# Patient Record
Sex: Female | Born: 1950
Health system: Southern US, Community
[De-identification: ages and names within clinical notes are randomized; demographics above are authoritative.]

## PROBLEM LIST (undated history)

## (undated) DIAGNOSIS — C50119 Malignant neoplasm of central portion of unspecified female breast: Secondary | ICD-10-CM

## (undated) DIAGNOSIS — N6019 Diffuse cystic mastopathy of unspecified breast: Secondary | ICD-10-CM

## (undated) DIAGNOSIS — I499 Cardiac arrhythmia, unspecified: Secondary | ICD-10-CM

## (undated) DIAGNOSIS — F419 Anxiety disorder, unspecified: Secondary | ICD-10-CM

## (undated) DIAGNOSIS — K219 Gastro-esophageal reflux disease without esophagitis: Secondary | ICD-10-CM

## (undated) DIAGNOSIS — I4891 Unspecified atrial fibrillation: Secondary | ICD-10-CM

## (undated) DIAGNOSIS — I10 Essential (primary) hypertension: Secondary | ICD-10-CM

## (undated) DIAGNOSIS — C50919 Malignant neoplasm of unspecified site of unspecified female breast: Secondary | ICD-10-CM

## (undated) DIAGNOSIS — N63 Unspecified lump in unspecified breast: Secondary | ICD-10-CM

## (undated) HISTORY — DX: Essential (primary) hypertension: I10

## (undated) HISTORY — PX: TONSILLECTOMY: SUR1361

## (undated) HISTORY — DX: Unspecified atrial fibrillation: I48.91

## (undated) HISTORY — PX: BREAST SURGERY: SHX581

## (undated) HISTORY — DX: Unspecified lump in unspecified breast: N63.0

## (undated) HISTORY — DX: Diffuse cystic mastopathy of unspecified breast: N60.19

## (undated) HISTORY — DX: Anxiety disorder, unspecified: F41.9

## (undated) HISTORY — DX: Malignant neoplasm of central portion of unspecified female breast: C50.119

## (undated) HISTORY — DX: Gastro-esophageal reflux disease without esophagitis: K21.9

---

## 1997-12-22 HISTORY — PX: ABDOMINAL HYSTERECTOMY: SHX81

## 2000-12-22 HISTORY — PX: COLONOSCOPY: SHX174

## 2002-12-22 HISTORY — PX: OTHER SURGICAL HISTORY: SHX169

## 2003-12-23 DIAGNOSIS — I10 Essential (primary) hypertension: Secondary | ICD-10-CM

## 2003-12-23 DIAGNOSIS — N6019 Diffuse cystic mastopathy of unspecified breast: Secondary | ICD-10-CM

## 2003-12-23 HISTORY — DX: Essential (primary) hypertension: I10

## 2003-12-23 HISTORY — PX: KNEE SURGERY: SHX244

## 2003-12-23 HISTORY — DX: Diffuse cystic mastopathy of unspecified breast: N60.19

## 2005-09-02 ENCOUNTER — Ambulatory Visit: Payer: Self-pay | Admitting: Obstetrics and Gynecology

## 2005-09-10 ENCOUNTER — Ambulatory Visit: Payer: Self-pay | Admitting: Obstetrics and Gynecology

## 2006-09-08 ENCOUNTER — Ambulatory Visit: Payer: Self-pay | Admitting: Obstetrics and Gynecology

## 2007-09-10 ENCOUNTER — Ambulatory Visit: Payer: Self-pay | Admitting: Obstetrics and Gynecology

## 2011-01-02 ENCOUNTER — Ambulatory Visit: Payer: Self-pay | Admitting: Family Medicine

## 2012-12-22 DIAGNOSIS — C50919 Malignant neoplasm of unspecified site of unspecified female breast: Secondary | ICD-10-CM

## 2012-12-22 DIAGNOSIS — N63 Unspecified lump in unspecified breast: Secondary | ICD-10-CM

## 2012-12-22 DIAGNOSIS — I4891 Unspecified atrial fibrillation: Secondary | ICD-10-CM

## 2012-12-22 HISTORY — PX: MASTECTOMY: SHX3

## 2012-12-22 HISTORY — DX: Malignant neoplasm of unspecified site of unspecified female breast: C50.919

## 2012-12-22 HISTORY — DX: Unspecified lump in unspecified breast: N63.0

## 2012-12-22 HISTORY — PX: BREAST SURGERY: SHX581

## 2012-12-22 HISTORY — DX: Unspecified atrial fibrillation: I48.91

## 2012-12-31 ENCOUNTER — Ambulatory Visit: Payer: Self-pay | Admitting: Family Medicine

## 2012-12-31 LAB — HM MAMMOGRAPHY

## 2013-01-10 ENCOUNTER — Ambulatory Visit: Payer: Self-pay | Admitting: General Surgery

## 2013-01-17 ENCOUNTER — Ambulatory Visit: Payer: Self-pay | Admitting: General Surgery

## 2013-01-24 ENCOUNTER — Ambulatory Visit: Payer: Self-pay | Admitting: General Surgery

## 2013-01-24 DIAGNOSIS — C50119 Malignant neoplasm of central portion of unspecified female breast: Secondary | ICD-10-CM

## 2013-01-24 HISTORY — DX: Malignant neoplasm of central portion of unspecified female breast: C50.119

## 2013-01-24 LAB — PROTIME-INR
INR: 1.1
Prothrombin Time: 15 secs — ABNORMAL HIGH (ref 11.5–14.7)

## 2013-01-27 ENCOUNTER — Ambulatory Visit: Payer: Self-pay | Admitting: Hematology and Oncology

## 2013-02-18 ENCOUNTER — Ambulatory Visit: Payer: Self-pay | Admitting: Radiation Oncology

## 2013-02-19 ENCOUNTER — Ambulatory Visit: Payer: Self-pay | Admitting: Hematology and Oncology

## 2013-03-01 LAB — PATHOLOGY REPORT

## 2013-03-04 DIAGNOSIS — Z09 Encounter for follow-up examination after completed treatment for conditions other than malignant neoplasm: Secondary | ICD-10-CM

## 2013-03-04 LAB — COMPREHENSIVE METABOLIC PANEL
Albumin: 3.6 g/dL (ref 3.4–5.0)
Alkaline Phosphatase: 79 U/L (ref 50–136)
Anion Gap: 10 (ref 7–16)
BUN: 15 mg/dL (ref 7–18)
Bilirubin,Total: 0.5 mg/dL (ref 0.2–1.0)
Calcium, Total: 8.5 mg/dL (ref 8.5–10.1)
Chloride: 103 mmol/L (ref 98–107)
Co2: 29 mmol/L (ref 21–32)
Creatinine: 1.07 mg/dL (ref 0.60–1.30)
EGFR (African American): >60
EGFR (Non-African Amer.): 56 — ABNORMAL LOW
Glucose: 111 mg/dL — ABNORMAL HIGH (ref 65–99)
Osmolality: 285 (ref 275–301)
Potassium: 3.5 mmol/L (ref 3.5–5.1)
SGOT(AST): 17 U/L (ref 15–37)
SGPT (ALT): 22 U/L (ref 12–78)
Sodium: 142 mmol/L (ref 136–145)
Total Protein: 7.5 g/dL (ref 6.4–8.2)

## 2013-03-04 LAB — CBC CANCER CENTER
Basophil #: 0.1 x10 3/mm (ref 0.0–0.1)
Basophil %: 0.7 %
Eosinophil #: 0.2 x10 3/mm (ref 0.0–0.7)
Eosinophil %: 1.9 %
HCT: 38.1 % (ref 35.0–47.0)
HGB: 13.1 g/dL (ref 12.0–16.0)
Lymphocyte #: 2.5 x10 3/mm (ref 1.0–3.6)
Lymphocyte %: 26.8 %
MCH: 29.7 pg (ref 26.0–34.0)
MCHC: 34.4 g/dL (ref 32.0–36.0)
MCV: 86 fL (ref 80–100)
Monocyte #: 0.8 x10 3/mm (ref 0.2–0.9)
Monocyte %: 8.5 %
Neutrophil #: 5.9 x10 3/mm (ref 1.4–6.5)
Neutrophil %: 62.1 %
Platelet: 196 x10 3/mm (ref 150–440)
RBC: 4.42 10*6/uL (ref 3.80–5.20)
RDW: 14.4 % (ref 11.5–14.5)
WBC: 9.4 x10 3/mm (ref 3.6–11.0)

## 2013-03-04 LAB — PROTIME-INR
INR: 2.1
Prothrombin Time: 23.2 secs — ABNORMAL HIGH (ref 11.5–14.7)

## 2013-03-07 ENCOUNTER — Ambulatory Visit: Payer: Self-pay | Admitting: General Surgery

## 2013-03-07 DIAGNOSIS — C50919 Malignant neoplasm of unspecified site of unspecified female breast: Secondary | ICD-10-CM

## 2013-03-07 LAB — PROTIME-INR
INR: 1.2
Prothrombin Time: 15.2 secs — ABNORMAL HIGH (ref 11.5–14.7)

## 2013-03-15 LAB — CBC CANCER CENTER
Basophil #: 0 x10 3/mm (ref 0.0–0.1)
Basophil %: 1.1 %
Eosinophil #: 0 x10 3/mm (ref 0.0–0.7)
Eosinophil %: 2 %
HCT: 35.4 % (ref 35.0–47.0)
HGB: 12.4 g/dL (ref 12.0–16.0)
Lymphocyte #: 1.3 x10 3/mm (ref 1.0–3.6)
Lymphocyte %: 82.7 %
MCH: 30.4 pg (ref 26.0–34.0)
MCHC: 34.9 g/dL (ref 32.0–36.0)
MCV: 87 fL (ref 80–100)
Monocyte #: 0.1 x10 3/mm — ABNORMAL LOW (ref 0.2–0.9)
Monocyte %: 4.9 %
Neutrophil #: 0.2 x10 3/mm — ABNORMAL LOW (ref 1.4–6.5)
Neutrophil %: 9.3 %
Platelet: 175 x10 3/mm (ref 150–440)
RBC: 4.07 10*6/uL (ref 3.80–5.20)
RDW: 14.1 % (ref 11.5–14.5)
WBC: 1.6 x10 3/mm — CL (ref 3.6–11.0)

## 2013-03-18 LAB — BASIC METABOLIC PANEL
Anion Gap: 11 (ref 7–16)
BUN: 27 mg/dL — ABNORMAL HIGH (ref 7–18)
Calcium, Total: 8.5 mg/dL (ref 8.5–10.1)
Chloride: 97 mmol/L — ABNORMAL LOW (ref 98–107)
Co2: 29 mmol/L (ref 21–32)
Creatinine: 1.54 mg/dL — ABNORMAL HIGH (ref 0.60–1.30)
EGFR (African American): 41 — ABNORMAL LOW
EGFR (Non-African Amer.): 36 — ABNORMAL LOW
Glucose: 132 mg/dL — ABNORMAL HIGH (ref 65–99)
Osmolality: 281 (ref 275–301)
Potassium: 3.6 mmol/L (ref 3.5–5.1)
Sodium: 137 mmol/L (ref 136–145)

## 2013-03-18 LAB — CBC CANCER CENTER
Basophil #: 0 x10 3/mm (ref 0.0–0.1)
Basophil %: 1.1 %
Eosinophil #: 0 x10 3/mm (ref 0.0–0.7)
Eosinophil %: 1 %
HCT: 33.9 % — ABNORMAL LOW (ref 35.0–47.0)
HGB: 11.7 g/dL — ABNORMAL LOW (ref 12.0–16.0)
Lymphocyte #: 1.6 x10 3/mm (ref 1.0–3.6)
Lymphocyte %: 60.3 %
MCH: 30 pg (ref 26.0–34.0)
MCHC: 34.6 g/dL (ref 32.0–36.0)
MCV: 87 fL (ref 80–100)
Monocyte #: 0.9 x10 3/mm (ref 0.2–0.9)
Monocyte %: 34.5 %
Neutrophil #: 0.1 x10 3/mm — ABNORMAL LOW (ref 1.4–6.5)
Neutrophil %: 3.1 %
Platelet: 211 x10 3/mm (ref 150–440)
RBC: 3.91 10*6/uL (ref 3.80–5.20)
RDW: 14.4 % (ref 11.5–14.5)
WBC: 2.6 x10 3/mm — ABNORMAL LOW (ref 3.6–11.0)

## 2013-03-22 ENCOUNTER — Ambulatory Visit: Payer: Self-pay | Admitting: Hematology and Oncology

## 2013-03-22 LAB — CBC CANCER CENTER
Basophil #: 0.1 x10 3/mm (ref 0.0–0.1)
Basophil %: 1.9 %
Eosinophil #: 0 x10 3/mm (ref 0.0–0.7)
Eosinophil %: 0.4 %
HCT: 36.5 % (ref 35.0–47.0)
HGB: 12.6 g/dL (ref 12.0–16.0)
Lymphocyte #: 1.1 x10 3/mm (ref 1.0–3.6)
Lymphocyte %: 24 %
MCH: 29.5 pg (ref 26.0–34.0)
MCHC: 34.5 g/dL (ref 32.0–36.0)
MCV: 86 fL (ref 80–100)
Monocyte #: 0.7 x10 3/mm (ref 0.2–0.9)
Monocyte %: 14.6 %
Neutrophil #: 2.8 x10 3/mm (ref 1.4–6.5)
Neutrophil %: 59.1 %
Platelet: 154 x10 3/mm (ref 150–440)
RBC: 4.27 10*6/uL (ref 3.80–5.20)
RDW: 14.8 % — ABNORMAL HIGH (ref 11.5–14.5)
WBC: 4.8 x10 3/mm (ref 3.6–11.0)

## 2013-03-28 ENCOUNTER — Encounter: Payer: Self-pay | Admitting: *Deleted

## 2013-03-28 DIAGNOSIS — N63 Unspecified lump in unspecified breast: Secondary | ICD-10-CM | POA: Insufficient documentation

## 2013-03-28 DIAGNOSIS — C50119 Malignant neoplasm of central portion of unspecified female breast: Secondary | ICD-10-CM | POA: Insufficient documentation

## 2013-03-29 LAB — PROTIME-INR
INR: 1.9
Prothrombin Time: 21.6 secs — ABNORMAL HIGH (ref 11.5–14.7)

## 2013-03-29 LAB — CBC CANCER CENTER
Basophil #: 0.1 x10 3/mm (ref 0.0–0.1)
Basophil %: 1 %
Eosinophil #: 0.1 x10 3/mm (ref 0.0–0.7)
Eosinophil %: 0.6 %
HCT: 34.4 % — ABNORMAL LOW (ref 35.0–47.0)
HGB: 11.7 g/dL — ABNORMAL LOW (ref 12.0–16.0)
Lymphocyte #: 2 x10 3/mm (ref 1.0–3.6)
Lymphocyte %: 18.8 %
MCH: 29.2 pg (ref 26.0–34.0)
MCHC: 34.1 g/dL (ref 32.0–36.0)
MCV: 86 fL (ref 80–100)
Monocyte #: 0.8 x10 3/mm (ref 0.2–0.9)
Monocyte %: 7.3 %
Neutrophil #: 7.7 x10 3/mm — ABNORMAL HIGH (ref 1.4–6.5)
Neutrophil %: 72.3 %
Platelet: 181 x10 3/mm (ref 150–440)
RBC: 4.01 10*6/uL (ref 3.80–5.20)
RDW: 14.8 % — ABNORMAL HIGH (ref 11.5–14.5)
WBC: 10.7 x10 3/mm (ref 3.6–11.0)

## 2013-03-29 LAB — COMPREHENSIVE METABOLIC PANEL
Albumin: 3.2 g/dL — ABNORMAL LOW (ref 3.4–5.0)
Alkaline Phosphatase: 74 U/L (ref 50–136)
Anion Gap: 7 (ref 7–16)
BUN: 17 mg/dL (ref 7–18)
Bilirubin,Total: 0.4 mg/dL (ref 0.2–1.0)
Calcium, Total: 8.6 mg/dL (ref 8.5–10.1)
Chloride: 102 mmol/L (ref 98–107)
Co2: 29 mmol/L (ref 21–32)
Creatinine: 1.05 mg/dL (ref 0.60–1.30)
EGFR (African American): >60
EGFR (Non-African Amer.): 57 — ABNORMAL LOW
Glucose: 132 mg/dL — ABNORMAL HIGH (ref 65–99)
Osmolality: 279 (ref 275–301)
Potassium: 3.9 mmol/L (ref 3.5–5.1)
SGOT(AST): 21 U/L (ref 15–37)
SGPT (ALT): 32 U/L (ref 12–78)
Sodium: 138 mmol/L (ref 136–145)
Total Protein: 7 g/dL (ref 6.4–8.2)

## 2013-04-05 ENCOUNTER — Encounter: Payer: Self-pay | Admitting: General Surgery

## 2013-04-05 ENCOUNTER — Ambulatory Visit (INDEPENDENT_AMBULATORY_CARE_PROVIDER_SITE_OTHER): Payer: BC Managed Care – PPO | Admitting: General Surgery

## 2013-04-05 VITALS — BP 124/82 | HR 80 | Resp 14 | Ht 64.0 in | Wt 217.0 lb

## 2013-04-05 DIAGNOSIS — C50911 Malignant neoplasm of unspecified site of right female breast: Secondary | ICD-10-CM

## 2013-04-05 DIAGNOSIS — C50919 Malignant neoplasm of unspecified site of unspecified female breast: Secondary | ICD-10-CM

## 2013-04-05 NOTE — Patient Instructions (Signed)
Patient to follow up in September 2014.

## 2013-04-05 NOTE — Progress Notes (Signed)
Patient ID: Erin Romero, female   DOB: [birth year], 62 y.o.   MRN: [id number]  Chief Complaint  Patient presents with  . Breast Cancer Long Term Follow Up    HPI Erin Romero is a 62 y.o. female who presents for a 1 month follow up breast cancer. Patient not feeling well due to chemotherapy treatments. She states she has had 2-3 treatments at this point. She states she sometimes feels pain in the left breast but thinks at times it's her imagination and nervousness since she had a right mastectomy. No other complaints at this time.  HPI  Past Medical History  Diagnosis Date  . Hypertension 2005  . Atrial fibrillation 2014  . Lump or mass in breast 2014    right mastectomy  . Malignant neoplasm of central portion of female breast 2014    right mastectomy  . Fibrocystic breast disease 2005    Past Surgical History  Procedure Laterality Date  . Breast surgery Right 2005,2014    biopsy  . Breast surgery Right 2014    mastectomy  . Knee surgery Left 2005  . Abdominal hysterectomy  1999  . Thumb surg Right 2004  . Tonsillectomy      age of 42  . Colonoscopy  2002    Family History  Problem Relation Age of Onset  . Cancer Other     ovarian,liver,colon cancer-no family member listed    Social History History  Substance Use Topics  . Smoking status: Never Smoker   . Smokeless tobacco: Not on file  . Alcohol Use: No    Allergies  Allergen Reactions  . Sulfa Antibiotics Hives    Current Outpatient Prescriptions  Medication Sig Dispense Refill  . acetaminophen (TYLENOL) 500 MG tablet Take 500-1,000 mg by mouth every 4 (four) hours as needed for pain (q 4-6 prn).      . ALPRAZolam (XANAX) 0.5 MG tablet Take 0.5 mg by mouth 2 (two) times daily. Take 1/2 to 1 tablet twice a day      . amLODipine (NORVASC) 5 MG tablet Take 10 mg by mouth daily.      Marland Kitchen aspirin 81 MG chewable tablet Chew 81 mg by mouth daily.      . bisoprolol-hydrochlorothiazide (ZIAC) 10-6.25  MG per tablet Take 1 tablet by mouth daily.      . Calcium Carb-Cholecalciferol (OYSTER SHELL CALCIUM + D PO) Take by mouth.      . Cholecalciferol (VITAMIN D-3 PO) Take 2,000 Units by mouth daily.      . hydrochlorothiazide (MICROZIDE) 12.5 MG capsule Take 12.5 mg by mouth daily.      Marland Kitchen lisinopril (PRINIVIL,ZESTRIL) 20 MG tablet Take 20 mg by mouth daily.      Marland Kitchen omeprazole (PRILOSEC) 20 MG capsule Take 20 mg by mouth daily.      . temazepam (RESTORIL) 15 MG capsule Take 15 mg by mouth at bedtime as needed.       . warfarin (COUMADIN) 2.5 MG tablet Take 2.5 mg by mouth daily.       No current facility-administered medications for this visit.    Review of Systems Review of Systems  Constitutional: Negative.   Respiratory: Negative.   Cardiovascular: Negative.     Blood pressure 124/82, pulse 80, resp. rate 14, height 5\' 4"  (1.626 m), weight 217 lb (98.431 kg).  Physical Exam Physical Exam  Constitutional: She appears well-developed and well-nourished.  Neck: Trachea normal. No mass and no thyromegaly present.  Cardiovascular:  Normal rate, regular rhythm, normal heart sounds and normal pulses.   No murmur heard. Pulmonary/Chest: Effort normal and breath sounds normal. Left breast exhibits no inverted nipple, no mass, no nipple discharge, no skin change and no tenderness.  Power port looks good as well as right mastectomy site.     Data Reviewed None  Assessment    Doing well status post wide excision, sentinel node biopsy. Moderate fatigue secondary to recently initiated chemotherapy.    Plan    Based on the anticipated course of adjuvant chemotherapy and post chemotherapy radiation, plans will be for a followup examination in September 2014 with bilateral diagnostic mammograms.       Erin Romero 04/06/2013, 3:51 PM

## 2013-04-06 ENCOUNTER — Encounter: Payer: Self-pay | Admitting: General Surgery

## 2013-04-06 LAB — COMPREHENSIVE METABOLIC PANEL
Albumin: 2.9 g/dL — ABNORMAL LOW (ref 3.4–5.0)
Alkaline Phosphatase: 59 U/L (ref 50–136)
Anion Gap: 5 — ABNORMAL LOW (ref 7–16)
BUN: 15 mg/dL (ref 7–18)
Bilirubin,Total: 0.6 mg/dL (ref 0.2–1.0)
Calcium, Total: 8.2 mg/dL — ABNORMAL LOW (ref 8.5–10.1)
Chloride: 106 mmol/L (ref 98–107)
Co2: 29 mmol/L (ref 21–32)
Creatinine: 0.67 mg/dL (ref 0.60–1.30)
EGFR (African American): >60
EGFR (Non-African Amer.): >60
Glucose: 87 mg/dL (ref 65–99)
Osmolality: 280 (ref 275–301)
Potassium: 3.6 mmol/L (ref 3.5–5.1)
SGOT(AST): 28 U/L (ref 15–37)
SGPT (ALT): 33 U/L (ref 12–78)
Sodium: 140 mmol/L (ref 136–145)
Total Protein: 6 g/dL — ABNORMAL LOW (ref 6.4–8.2)

## 2013-04-06 LAB — CBC CANCER CENTER
Basophil #: 0 x10 3/mm (ref 0.0–0.1)
Basophil %: 3.6 %
Eosinophil #: 0 x10 3/mm (ref 0.0–0.7)
Eosinophil %: 2.7 %
HCT: 32.8 % — ABNORMAL LOW (ref 35.0–47.0)
HGB: 11.4 g/dL — ABNORMAL LOW (ref 12.0–16.0)
Lymphocyte #: 0.9 x10 3/mm — ABNORMAL LOW (ref 1.0–3.6)
Lymphocyte %: 63.2 %
MCH: 29.8 pg (ref 26.0–34.0)
MCHC: 34.9 g/dL (ref 32.0–36.0)
MCV: 86 fL (ref 80–100)
Monocyte #: 0.2 x10 3/mm (ref 0.2–0.9)
Monocyte %: 14.8 %
Neutrophil #: 0.2 x10 3/mm — ABNORMAL LOW (ref 1.4–6.5)
Neutrophil %: 15.7 %
Platelet: 171 x10 3/mm (ref 150–440)
RBC: 3.84 10*6/uL (ref 3.80–5.20)
RDW: 15.2 % — ABNORMAL HIGH (ref 11.5–14.5)
WBC: 1.4 x10 3/mm — CL (ref 3.6–11.0)

## 2013-04-06 LAB — MAGNESIUM: Magnesium: 1.7 mg/dL — ABNORMAL LOW

## 2013-04-13 LAB — BASIC METABOLIC PANEL
Anion Gap: 11 (ref 7–16)
BUN: 13 mg/dL (ref 7–18)
Calcium, Total: 9 mg/dL (ref 8.5–10.1)
Chloride: 104 mmol/L (ref 98–107)
Co2: 26 mmol/L (ref 21–32)
Creatinine: 0.98 mg/dL (ref 0.60–1.30)
EGFR (African American): >60
EGFR (Non-African Amer.): >60
Glucose: 120 mg/dL — ABNORMAL HIGH (ref 65–99)
Osmolality: 283 (ref 275–301)
Potassium: 3.7 mmol/L (ref 3.5–5.1)
Sodium: 141 mmol/L (ref 136–145)

## 2013-04-13 LAB — CBC CANCER CENTER
Basophil #: 0.1 x10 3/mm (ref 0.0–0.1)
Basophil %: 1.4 %
Eosinophil #: 0 x10 3/mm (ref 0.0–0.7)
Eosinophil %: 0.4 %
HCT: 31.8 % — ABNORMAL LOW (ref 35.0–47.0)
HGB: 10.9 g/dL — ABNORMAL LOW (ref 12.0–16.0)
Lymphocyte #: 1.6 x10 3/mm (ref 1.0–3.6)
Lymphocyte %: 25.9 %
MCH: 29.2 pg (ref 26.0–34.0)
MCHC: 34.3 g/dL (ref 32.0–36.0)
MCV: 85 fL (ref 80–100)
Monocyte #: 1 x10 3/mm — ABNORMAL HIGH (ref 0.2–0.9)
Monocyte %: 16.7 %
Neutrophil #: 3.4 x10 3/mm (ref 1.4–6.5)
Neutrophil %: 55.6 %
Platelet: 154 x10 3/mm (ref 150–440)
RBC: 3.74 10*6/uL — ABNORMAL LOW (ref 3.80–5.20)
RDW: 17.2 % — ABNORMAL HIGH (ref 11.5–14.5)
WBC: 6.2 x10 3/mm (ref 3.6–11.0)

## 2013-04-20 LAB — COMPREHENSIVE METABOLIC PANEL
Albumin: 3.2 g/dL — ABNORMAL LOW (ref 3.4–5.0)
Alkaline Phosphatase: 68 U/L (ref 50–136)
Anion Gap: 9 (ref 7–16)
BUN: 14 mg/dL (ref 7–18)
Bilirubin,Total: 0.4 mg/dL (ref 0.2–1.0)
Calcium, Total: 8.9 mg/dL (ref 8.5–10.1)
Chloride: 104 mmol/L (ref 98–107)
Co2: 26 mmol/L (ref 21–32)
Creatinine: 0.88 mg/dL (ref 0.60–1.30)
EGFR (African American): >60
EGFR (Non-African Amer.): >60
Glucose: 138 mg/dL — ABNORMAL HIGH (ref 65–99)
Osmolality: 280 (ref 275–301)
Potassium: 3.8 mmol/L (ref 3.5–5.1)
SGOT(AST): 16 U/L (ref 15–37)
SGPT (ALT): 22 U/L (ref 12–78)
Sodium: 139 mmol/L (ref 136–145)
Total Protein: 7 g/dL (ref 6.4–8.2)

## 2013-04-20 LAB — CBC CANCER CENTER
Basophil #: 0.1 x10 3/mm (ref 0.0–0.1)
Basophil %: 1.2 %
Eosinophil #: 0 x10 3/mm (ref 0.0–0.7)
Eosinophil %: 0.4 %
HCT: 33.8 % — ABNORMAL LOW (ref 35.0–47.0)
HGB: 11.3 g/dL — ABNORMAL LOW (ref 12.0–16.0)
Lymphocyte #: 1.8 x10 3/mm (ref 1.0–3.6)
Lymphocyte %: 19.7 %
MCH: 28.9 pg (ref 26.0–34.0)
MCHC: 33.3 g/dL (ref 32.0–36.0)
MCV: 87 fL (ref 80–100)
Monocyte #: 0.6 x10 3/mm (ref 0.2–0.9)
Monocyte %: 6.9 %
Neutrophil #: 6.5 x10 3/mm (ref 1.4–6.5)
Neutrophil %: 71.8 %
Platelet: 183 x10 3/mm (ref 150–440)
RBC: 3.89 10*6/uL (ref 3.80–5.20)
RDW: 18.6 % — ABNORMAL HIGH (ref 11.5–14.5)
WBC: 9.1 x10 3/mm (ref 3.6–11.0)

## 2013-04-21 ENCOUNTER — Ambulatory Visit: Payer: Self-pay | Admitting: Hematology and Oncology

## 2013-04-27 LAB — CBC CANCER CENTER
Bands: 4 %
HCT: 31.9 % — ABNORMAL LOW
HGB: 11 g/dL — ABNORMAL LOW
Lymphocytes: 49 %
MCH: 29.6 pg
MCHC: 34.3 g/dL
MCV: 86 fL
Metamyelocyte: 3 %
Monocytes: 11 %
Myelocyte: 1 %
NRBC/100 WBC: 2 /100
Platelet: 160 "x10 3/mm "
RBC: 3.71 "x10 6/mm " — ABNORMAL LOW
RDW: 18.5 % — ABNORMAL HIGH
Segmented Neutrophils: 32 %
WBC: 3.1 "x10 3/mm " — ABNORMAL LOW

## 2013-04-27 LAB — PROTIME-INR
INR: 1.7
Prothrombin Time: 19.6 s — ABNORMAL HIGH

## 2013-05-04 ENCOUNTER — Encounter: Payer: Self-pay | Admitting: General Surgery

## 2013-05-04 LAB — CBC CANCER CENTER
Basophil #: 0.1 x10 3/mm (ref 0.0–0.1)
Basophil %: 0.4 %
Eosinophil #: 0 x10 3/mm (ref 0.0–0.7)
Eosinophil %: 0.1 %
HCT: 36 % (ref 35.0–47.0)
HGB: 12.1 g/dL (ref 12.0–16.0)
Lymphocyte #: 2.9 x10 3/mm (ref 1.0–3.6)
Lymphocyte %: 14.5 %
MCH: 29.4 pg (ref 26.0–34.0)
MCHC: 33.6 g/dL (ref 32.0–36.0)
MCV: 88 fL (ref 80–100)
Monocyte #: 1.2 x10 3/mm — ABNORMAL HIGH (ref 0.2–0.9)
Monocyte %: 6.1 %
Neutrophil #: 15.8 x10 3/mm — ABNORMAL HIGH (ref 1.4–6.5)
Neutrophil %: 78.9 %
Platelet: 141 x10 3/mm — ABNORMAL LOW (ref 150–440)
RBC: 4.11 10*6/uL (ref 3.80–5.20)
RDW: 19.6 % — ABNORMAL HIGH (ref 11.5–14.5)
WBC: 20.1 x10 3/mm — ABNORMAL HIGH (ref 3.6–11.0)

## 2013-05-11 LAB — COMPREHENSIVE METABOLIC PANEL
Albumin: 3.1 g/dL — ABNORMAL LOW (ref 3.4–5.0)
Alkaline Phosphatase: 87 U/L (ref 50–136)
Anion Gap: 10 (ref 7–16)
BUN: 11 mg/dL (ref 7–18)
Bilirubin,Total: 0.6 mg/dL (ref 0.2–1.0)
Calcium, Total: 8.9 mg/dL (ref 8.5–10.1)
Chloride: 104 mmol/L (ref 98–107)
Co2: 27 mmol/L (ref 21–32)
Creatinine: 0.85 mg/dL (ref 0.60–1.30)
EGFR (African American): >60
EGFR (Non-African Amer.): >60
Glucose: 139 mg/dL — ABNORMAL HIGH (ref 65–99)
Osmolality: 283 (ref 275–301)
Potassium: 3.6 mmol/L (ref 3.5–5.1)
SGOT(AST): 17 U/L (ref 15–37)
SGPT (ALT): 25 U/L (ref 12–78)
Sodium: 141 mmol/L (ref 136–145)
Total Protein: 6.8 g/dL (ref 6.4–8.2)

## 2013-05-11 LAB — CBC CANCER CENTER
Basophil #: 0.1 x10 3/mm (ref 0.0–0.1)
Basophil %: 0.6 %
Eosinophil #: 0 x10 3/mm (ref 0.0–0.7)
Eosinophil %: 0.2 %
HCT: 33.6 % — ABNORMAL LOW (ref 35.0–47.0)
HGB: 11.3 g/dL — ABNORMAL LOW (ref 12.0–16.0)
Lymphocyte #: 1.2 x10 3/mm (ref 1.0–3.6)
Lymphocyte %: 11.2 %
MCH: 29.8 pg (ref 26.0–34.0)
MCHC: 33.8 g/dL (ref 32.0–36.0)
MCV: 88 fL (ref 80–100)
Monocyte #: 1.2 x10 3/mm — ABNORMAL HIGH (ref 0.2–0.9)
Monocyte %: 10.9 %
Neutrophil #: 8.5 x10 3/mm — ABNORMAL HIGH (ref 1.4–6.5)
Neutrophil %: 77.1 %
Platelet: 178 x10 3/mm (ref 150–440)
RBC: 3.8 10*6/uL (ref 3.80–5.20)
RDW: 20.9 % — ABNORMAL HIGH (ref 11.5–14.5)
WBC: 11 x10 3/mm (ref 3.6–11.0)

## 2013-05-11 LAB — PROTIME-INR
INR: 2.3
Prothrombin Time: 24.5 secs — ABNORMAL HIGH (ref 11.5–14.7)

## 2013-05-22 ENCOUNTER — Ambulatory Visit: Payer: Self-pay | Admitting: Hematology and Oncology

## 2013-06-09 LAB — CBC CANCER CENTER
Basophil #: 0.1 x10 3/mm (ref 0.0–0.1)
Basophil %: 0.9 %
Eosinophil #: 0.1 x10 3/mm (ref 0.0–0.7)
Eosinophil %: 1.1 %
HCT: 35.3 % (ref 35.0–47.0)
HGB: 12.3 g/dL (ref 12.0–16.0)
Lymphocyte #: 1.5 x10 3/mm (ref 1.0–3.6)
Lymphocyte %: 22.7 %
MCH: 30.8 pg (ref 26.0–34.0)
MCHC: 34.9 g/dL (ref 32.0–36.0)
MCV: 88 fL (ref 80–100)
Monocyte #: 0.6 x10 3/mm (ref 0.2–0.9)
Monocyte %: 9.1 %
Neutrophil #: 4.3 x10 3/mm (ref 1.4–6.5)
Neutrophil %: 66.2 %
Platelet: 153 x10 3/mm (ref 150–440)
RBC: 4 10*6/uL (ref 3.80–5.20)
RDW: 17.5 % — ABNORMAL HIGH (ref 11.5–14.5)
WBC: 6.4 x10 3/mm (ref 3.6–11.0)

## 2013-06-15 LAB — CBC CANCER CENTER
Basophil #: 0 x10 3/mm (ref 0.0–0.1)
Basophil %: 0.8 %
Eosinophil #: 0.1 x10 3/mm (ref 0.0–0.7)
Eosinophil %: 1.4 %
HCT: 35.6 % (ref 35.0–47.0)
HGB: 12.1 g/dL (ref 12.0–16.0)
Lymphocyte #: 1.6 x10 3/mm (ref 1.0–3.6)
Lymphocyte %: 25.1 %
MCH: 30 pg (ref 26.0–34.0)
MCHC: 34.1 g/dL (ref 32.0–36.0)
MCV: 88 fL (ref 80–100)
Monocyte #: 0.7 x10 3/mm (ref 0.2–0.9)
Monocyte %: 10.8 %
Neutrophil #: 3.9 x10 3/mm (ref 1.4–6.5)
Neutrophil %: 61.9 %
Platelet: 141 x10 3/mm — ABNORMAL LOW (ref 150–440)
RBC: 4.05 10*6/uL (ref 3.80–5.20)
RDW: 16.9 % — ABNORMAL HIGH (ref 11.5–14.5)
WBC: 6.3 x10 3/mm (ref 3.6–11.0)

## 2013-06-15 LAB — COMPREHENSIVE METABOLIC PANEL
Albumin: 3.3 g/dL — ABNORMAL LOW (ref 3.4–5.0)
Alkaline Phosphatase: 69 U/L (ref 50–136)
Anion Gap: 10 (ref 7–16)
BUN: 12 mg/dL (ref 7–18)
Bilirubin,Total: 0.5 mg/dL (ref 0.2–1.0)
Calcium, Total: 8.8 mg/dL (ref 8.5–10.1)
Chloride: 103 mmol/L (ref 98–107)
Co2: 28 mmol/L (ref 21–32)
Creatinine: 0.84 mg/dL (ref 0.60–1.30)
EGFR (African American): >60
EGFR (Non-African Amer.): >60
Glucose: 120 mg/dL — ABNORMAL HIGH (ref 65–99)
Osmolality: 282 (ref 275–301)
Potassium: 3.7 mmol/L (ref 3.5–5.1)
SGOT(AST): 23 U/L (ref 15–37)
SGPT (ALT): 28 U/L (ref 12–78)
Sodium: 141 mmol/L (ref 136–145)
Total Protein: 6.7 g/dL (ref 6.4–8.2)

## 2013-06-15 LAB — PROTIME-INR
INR: 1.9
Prothrombin Time: 21.6 secs — ABNORMAL HIGH (ref 11.5–14.7)

## 2013-06-16 LAB — CANCER ANTIGEN 27.29: CA 27.29: 39.9 U/mL — ABNORMAL HIGH (ref 0.0–38.6)

## 2013-06-21 ENCOUNTER — Ambulatory Visit: Payer: Self-pay | Admitting: Hematology and Oncology

## 2013-06-23 LAB — CBC CANCER CENTER
Basophil #: 0.1 x10 3/mm (ref 0.0–0.1)
Basophil %: 1 %
Eosinophil #: 0.1 x10 3/mm (ref 0.0–0.7)
Eosinophil %: 1.7 %
HCT: 36.2 % (ref 35.0–47.0)
HGB: 12.6 g/dL (ref 12.0–16.0)
Lymphocyte #: 1.4 x10 3/mm (ref 1.0–3.6)
Lymphocyte %: 24.3 %
MCH: 30.3 pg (ref 26.0–34.0)
MCHC: 34.7 g/dL (ref 32.0–36.0)
MCV: 87 fL (ref 80–100)
Monocyte #: 0.6 x10 3/mm (ref 0.2–0.9)
Monocyte %: 10.4 %
Neutrophil #: 3.6 x10 3/mm (ref 1.4–6.5)
Neutrophil %: 62.6 %
Platelet: 147 x10 3/mm — ABNORMAL LOW (ref 150–440)
RBC: 4.15 10*6/uL (ref 3.80–5.20)
RDW: 16.6 % — ABNORMAL HIGH (ref 11.5–14.5)
WBC: 5.7 x10 3/mm (ref 3.6–11.0)

## 2013-06-30 LAB — CBC CANCER CENTER
Basophil #: 0 x10 3/mm (ref 0.0–0.1)
Basophil %: 0.5 %
Eosinophil #: 0.1 x10 3/mm (ref 0.0–0.7)
Eosinophil %: 1.9 %
HCT: 36.4 % (ref 35.0–47.0)
HGB: 12.8 g/dL (ref 12.0–16.0)
Lymphocyte #: 1.3 x10 3/mm (ref 1.0–3.6)
Lymphocyte %: 20.4 %
MCH: 30.2 pg (ref 26.0–34.0)
MCHC: 35.1 g/dL (ref 32.0–36.0)
MCV: 86 fL (ref 80–100)
Monocyte #: 0.6 x10 3/mm (ref 0.2–0.9)
Monocyte %: 10.2 %
Neutrophil #: 4.1 x10 3/mm (ref 1.4–6.5)
Neutrophil %: 67 %
Platelet: 152 x10 3/mm (ref 150–440)
RBC: 4.23 10*6/uL (ref 3.80–5.20)
RDW: 15.9 % — ABNORMAL HIGH (ref 11.5–14.5)
WBC: 6.2 x10 3/mm (ref 3.6–11.0)

## 2013-07-12 LAB — BASIC METABOLIC PANEL
Anion Gap: 4 — ABNORMAL LOW (ref 7–16)
BUN: 14 mg/dL (ref 7–18)
Calcium, Total: 8.9 mg/dL (ref 8.5–10.1)
Chloride: 105 mmol/L (ref 98–107)
Co2: 29 mmol/L (ref 21–32)
Creatinine: 0.71 mg/dL (ref 0.60–1.30)
EGFR (African American): >60
EGFR (Non-African Amer.): >60
Glucose: 121 mg/dL — ABNORMAL HIGH (ref 65–99)
Osmolality: 277 (ref 275–301)
Potassium: 3.7 mmol/L (ref 3.5–5.1)
Sodium: 138 mmol/L (ref 136–145)

## 2013-07-12 LAB — CBC CANCER CENTER
Basophil #: 0 x10 3/mm (ref 0.0–0.1)
Basophil %: 0.5 %
Eosinophil #: 0.1 x10 3/mm (ref 0.0–0.7)
Eosinophil %: 1.4 %
HCT: 36.2 % (ref 35.0–47.0)
HGB: 12.7 g/dL (ref 12.0–16.0)
Lymphocyte #: 0.9 x10 3/mm — ABNORMAL LOW (ref 1.0–3.6)
Lymphocyte %: 13.1 %
MCH: 30 pg (ref 26.0–34.0)
MCHC: 35 g/dL (ref 32.0–36.0)
MCV: 86 fL (ref 80–100)
Monocyte #: 0.6 x10 3/mm (ref 0.2–0.9)
Monocyte %: 9.5 %
Neutrophil #: 5.1 x10 3/mm (ref 1.4–6.5)
Neutrophil %: 75.5 %
Platelet: 163 x10 3/mm (ref 150–440)
RBC: 4.23 10*6/uL (ref 3.80–5.20)
RDW: 15.7 % — ABNORMAL HIGH (ref 11.5–14.5)
WBC: 6.8 x10 3/mm (ref 3.6–11.0)

## 2013-07-12 LAB — PROTIME-INR
INR: 2
Prothrombin Time: 22.4 secs — ABNORMAL HIGH (ref 11.5–14.7)

## 2013-07-22 ENCOUNTER — Ambulatory Visit: Payer: Self-pay | Admitting: Hematology and Oncology

## 2013-08-02 LAB — COMPREHENSIVE METABOLIC PANEL
Albumin: 3.5 g/dL (ref 3.4–5.0)
Alkaline Phosphatase: 85 U/L (ref 50–136)
Anion Gap: 9 (ref 7–16)
BUN: 15 mg/dL (ref 7–18)
Bilirubin,Total: 0.4 mg/dL (ref 0.2–1.0)
Calcium, Total: 8.9 mg/dL (ref 8.5–10.1)
Chloride: 103 mmol/L (ref 98–107)
Co2: 28 mmol/L (ref 21–32)
Creatinine: 0.77 mg/dL (ref 0.60–1.30)
EGFR (African American): >60
EGFR (Non-African Amer.): >60
Glucose: 103 mg/dL — ABNORMAL HIGH (ref 65–99)
Osmolality: 280 (ref 275–301)
Potassium: 3.7 mmol/L (ref 3.5–5.1)
SGOT(AST): 23 U/L (ref 15–37)
SGPT (ALT): 25 U/L (ref 12–78)
Sodium: 140 mmol/L (ref 136–145)
Total Protein: 7.2 g/dL (ref 6.4–8.2)

## 2013-08-02 LAB — CBC CANCER CENTER
Basophil #: 0.1 x10 3/mm (ref 0.0–0.1)
Basophil %: 0.7 %
Eosinophil #: 0.1 x10 3/mm (ref 0.0–0.7)
Eosinophil %: 1.6 %
HCT: 35.3 % (ref 35.0–47.0)
HGB: 12.6 g/dL (ref 12.0–16.0)
Lymphocyte #: 1.3 x10 3/mm (ref 1.0–3.6)
Lymphocyte %: 17.4 %
MCH: 30.2 pg (ref 26.0–34.0)
MCHC: 35.6 g/dL (ref 32.0–36.0)
MCV: 85 fL (ref 80–100)
Monocyte #: 0.7 x10 3/mm (ref 0.2–0.9)
Monocyte %: 9.9 %
Neutrophil #: 5.1 x10 3/mm (ref 1.4–6.5)
Neutrophil %: 70.4 %
Platelet: 158 x10 3/mm (ref 150–440)
RBC: 4.17 10*6/uL (ref 3.80–5.20)
RDW: 15.8 % — ABNORMAL HIGH (ref 11.5–14.5)
WBC: 7.3 x10 3/mm (ref 3.6–11.0)

## 2013-08-02 LAB — PROTIME-INR
INR: 2.1
Prothrombin Time: 22.8 secs — ABNORMAL HIGH (ref 11.5–14.7)

## 2013-08-03 LAB — CANCER ANTIGEN 27.29: CA 27.29: 22.9 U/mL (ref 0.0–38.6)

## 2013-08-16 LAB — CBC CANCER CENTER
Basophil #: 0.1 x10 3/mm (ref 0.0–0.1)
Basophil %: 0.7 %
Eosinophil #: 0.1 x10 3/mm (ref 0.0–0.7)
Eosinophil %: 1.6 %
HCT: 35.3 % (ref 35.0–47.0)
HGB: 12.5 g/dL (ref 12.0–16.0)
Lymphocyte #: 1.4 x10 3/mm (ref 1.0–3.6)
Lymphocyte %: 17.3 %
MCH: 30.5 pg (ref 26.0–34.0)
MCHC: 35.6 g/dL (ref 32.0–36.0)
MCV: 86 fL (ref 80–100)
Monocyte #: 0.8 x10 3/mm (ref 0.2–0.9)
Monocyte %: 9.5 %
Neutrophil #: 5.8 x10 3/mm (ref 1.4–6.5)
Neutrophil %: 70.9 %
Platelet: 165 x10 3/mm (ref 150–440)
RBC: 4.11 10*6/uL (ref 3.80–5.20)
RDW: 16 % — ABNORMAL HIGH (ref 11.5–14.5)
WBC: 8.1 x10 3/mm (ref 3.6–11.0)

## 2013-08-16 LAB — COMPREHENSIVE METABOLIC PANEL
Albumin: 3.6 g/dL (ref 3.4–5.0)
Alkaline Phosphatase: 91 U/L (ref 50–136)
Anion Gap: 8 (ref 7–16)
BUN: 11 mg/dL (ref 7–18)
Bilirubin,Total: 0.6 mg/dL (ref 0.2–1.0)
Calcium, Total: 9.1 mg/dL (ref 8.5–10.1)
Chloride: 103 mmol/L (ref 98–107)
Co2: 29 mmol/L (ref 21–32)
Creatinine: 0.77 mg/dL (ref 0.60–1.30)
EGFR (African American): >60
EGFR (Non-African Amer.): >60
Glucose: 99 mg/dL (ref 65–99)
Osmolality: 279 (ref 275–301)
Potassium: 3.8 mmol/L (ref 3.5–5.1)
SGOT(AST): 20 U/L (ref 15–37)
SGPT (ALT): 22 U/L (ref 12–78)
Sodium: 140 mmol/L (ref 136–145)
Total Protein: 7.5 g/dL (ref 6.4–8.2)

## 2013-08-22 ENCOUNTER — Ambulatory Visit: Payer: Self-pay | Admitting: Hematology and Oncology

## 2013-09-12 ENCOUNTER — Ambulatory Visit (INDEPENDENT_AMBULATORY_CARE_PROVIDER_SITE_OTHER): Payer: BC Managed Care – PPO | Admitting: General Surgery

## 2013-09-12 ENCOUNTER — Encounter: Payer: Self-pay | Admitting: General Surgery

## 2013-09-12 VITALS — BP 140/90 | HR 80 | Resp 14 | Ht 64.0 in | Wt 224.0 lb

## 2013-09-12 DIAGNOSIS — C50911 Malignant neoplasm of unspecified site of right female breast: Secondary | ICD-10-CM | POA: Insufficient documentation

## 2013-09-12 DIAGNOSIS — Z853 Personal history of malignant neoplasm of breast: Secondary | ICD-10-CM

## 2013-09-12 NOTE — Progress Notes (Signed)
Patient ID: BREANNE OLVERA, female   DOB: 1951/07/28, 62 y.o.   MRN: [id number]  Chief Complaint  Patient presents with  . Follow-up    5 month breast cancer follow up no mammogram    HPI KALEEYA HANCOCK is a 62 y.o. female who presents for a 5 month follow up for right breast cancer. The patient underwent a right mastectomy in February 2014. The patient is concerned about a small area on the medial aspect of the right distal calf which has recently been traumatized and is sore.  HPI  Past Medical History  Diagnosis Date  . Hypertension 2005  . Atrial fibrillation 2014  . Lump or mass in breast 2014    right mastectomy  . Malignant neoplasm of central portion of female breast 2014    right mastectomy  . Fibrocystic breast disease 2005    Past Surgical History  Procedure Laterality Date  . Breast surgery Right 2005,2014    biopsy  . Breast surgery Right 2014    mastectomy  . Knee surgery Left 2005  . Abdominal hysterectomy  1999  . Thumb surg Right 2004  . Tonsillectomy      age of 42  . Colonoscopy  2002    Family History  Problem Relation Age of Onset  . Cancer Other     ovarian,liver,colon cancer-no family member listed    Social History History  Substance Use Topics  . Smoking status: Never Smoker   . Smokeless tobacco: Not on file  . Alcohol Use: No    Allergies  Allergen Reactions  . Sulfa Antibiotics Hives    Current Outpatient Prescriptions  Medication Sig Dispense Refill  . acetaminophen (TYLENOL) 500 MG tablet Take 500-1,000 mg by mouth every 4 (four) hours as needed for pain (q 4-6 prn).      . ALPRAZolam (XANAX) 0.5 MG tablet Take 0.5 mg by mouth 2 (two) times daily. Take 1/2 to 1 tablet twice a day      . amLODipine (NORVASC) 5 MG tablet Take 2.5 mg by mouth daily.       Marland Kitchen aspirin 81 MG chewable tablet Chew 81 mg by mouth daily.      . bisoprolol-hydrochlorothiazide (ZIAC) 10-6.25 MG per tablet Take 1 tablet by mouth daily.      .  Calcium Carb-Cholecalciferol (OYSTER SHELL CALCIUM + D PO) Take by mouth.      . Cholecalciferol (VITAMIN D-3 PO) Take 2,000 Units by mouth daily.      Marland Kitchen letrozole (FEMARA) 2.5 MG tablet Take 2.5 mg by mouth daily.      Marland Kitchen omeprazole (PRILOSEC) 20 MG capsule Take 20 mg by mouth daily.      Marland Kitchen warfarin (COUMADIN) 2.5 MG tablet Take 2.5 mg by mouth daily.       No current facility-administered medications for this visit.    Review of Systems Review of Systems  Constitutional: Negative.   Respiratory: Negative.   Cardiovascular: Negative.     Blood pressure 140/90, pulse 80, resp. rate 14, height 5\' 4"  (1.626 m), weight 224 lb (101.606 kg).  Physical Exam Physical Exam  Constitutional: She is oriented to person, place, and time. She appears well-developed and well-nourished.  Neck: No thyromegaly present.  Cardiovascular: Normal rate, regular rhythm and normal heart sounds.   No murmur heard. Pulmonary/Chest: Effort normal and breath sounds normal.  Well healed right mastectomy site.   Lymphadenopathy:    She has no cervical adenopathy.  Neurological: She is  alert and oriented to person, place, and time.  Skin: Skin is warm and dry.  Right distal medial calf changes in venous insufficiency.   There is evidence of a small plaque-like area to 3 cm in size surrounded by file H. is discoloration consistent with venous insufficiency. Minimal skin disruption is noted. This is likely secondary to recently describe trauma.  Data Reviewed No new data.  Assessment    Venous insufficiency.  Doing well status post mastectomy.    Plan    The patient has been encouraged to make use of over-the-counter compressive stockings (which she has used in the past) as her blood pressure is somewhat elevated and the antihypertensive regimen had previously been discontinued during chemotherapy, she's been encouraged to restart her hydrochlorothiazide.  We'll plan on a followup examination in 6 months  with a left breast mammogram at that time.       Earline Mayotte 09/13/2013, 7:09 AM

## 2013-09-12 NOTE — Patient Instructions (Addendum)
Patient advised to start Hydrochlorothiazide again. She is also advised to use over the counter compression hose. Patient to return in March 2015 with mammogram.

## 2013-09-13 ENCOUNTER — Encounter: Payer: Self-pay | Admitting: General Surgery

## 2013-09-21 ENCOUNTER — Encounter: Payer: Self-pay | Admitting: Hematology and Oncology

## 2013-09-29 ENCOUNTER — Ambulatory Visit: Payer: Self-pay | Admitting: Hematology and Oncology

## 2013-10-22 ENCOUNTER — Ambulatory Visit: Payer: Self-pay | Admitting: Hematology and Oncology

## 2013-11-10 LAB — CBC CANCER CENTER
Basophil #: 0.1 x10 3/mm (ref 0.0–0.1)
Basophil %: 1 %
Eosinophil #: 0.4 x10 3/mm (ref 0.0–0.7)
Eosinophil %: 3.6 %
HCT: 41 % (ref 35.0–47.0)
HGB: 13.8 g/dL (ref 12.0–16.0)
Lymphocyte #: 1.7 x10 3/mm (ref 1.0–3.6)
Lymphocyte %: 16.6 %
MCH: 28.8 pg (ref 26.0–34.0)
MCHC: 33.5 g/dL (ref 32.0–36.0)
MCV: 86 fL (ref 80–100)
Monocyte #: 0.7 x10 3/mm (ref 0.2–0.9)
Monocyte %: 6.8 %
Neutrophil #: 7.3 x10 3/mm — ABNORMAL HIGH (ref 1.4–6.5)
Neutrophil %: 72 %
Platelet: 192 x10 3/mm (ref 150–440)
RBC: 4.77 10*6/uL (ref 3.80–5.20)
RDW: 15.7 % — ABNORMAL HIGH (ref 11.5–14.5)
WBC: 10.2 x10 3/mm (ref 3.6–11.0)

## 2013-11-10 LAB — COMPREHENSIVE METABOLIC PANEL
Albumin: 3.5 g/dL (ref 3.4–5.0)
Alkaline Phosphatase: 82 U/L (ref 50–136)
Anion Gap: 8 (ref 7–16)
BUN: 16 mg/dL (ref 7–18)
Bilirubin,Total: 0.5 mg/dL (ref 0.2–1.0)
Calcium, Total: 9.2 mg/dL (ref 8.5–10.1)
Chloride: 101 mmol/L (ref 98–107)
Co2: 31 mmol/L (ref 21–32)
Creatinine: 0.92 mg/dL (ref 0.60–1.30)
EGFR (African American): >60
EGFR (Non-African Amer.): >60
Glucose: 128 mg/dL — ABNORMAL HIGH (ref 65–99)
Osmolality: 282 (ref 275–301)
Potassium: 3.5 mmol/L (ref 3.5–5.1)
SGOT(AST): 20 U/L (ref 15–37)
SGPT (ALT): 24 U/L (ref 12–78)
Sodium: 140 mmol/L (ref 136–145)
Total Protein: 7.5 g/dL (ref 6.4–8.2)

## 2013-11-10 LAB — PROTIME-INR
INR: 2.3
Prothrombin Time: 24.6 secs — ABNORMAL HIGH (ref 11.5–14.7)

## 2013-11-21 ENCOUNTER — Ambulatory Visit: Payer: Self-pay | Admitting: Hematology and Oncology

## 2013-12-22 HISTORY — PX: BREAST BIOPSY: SHX20

## 2014-01-10 ENCOUNTER — Encounter: Payer: Self-pay | Admitting: General Surgery

## 2014-01-11 DIAGNOSIS — C50919 Malignant neoplasm of unspecified site of unspecified female breast: Secondary | ICD-10-CM

## 2014-01-31 ENCOUNTER — Ambulatory Visit: Payer: Self-pay | Admitting: Hematology and Oncology

## 2014-02-02 ENCOUNTER — Ambulatory Visit: Payer: Self-pay | Admitting: General Surgery

## 2014-02-03 ENCOUNTER — Encounter: Payer: Self-pay | Admitting: General Surgery

## 2014-02-10 ENCOUNTER — Telehealth: Payer: Self-pay

## 2014-02-10 NOTE — Telephone Encounter (Signed)
Patient is rescheduled for follow up on 02/27/14.

## 2014-02-10 NOTE — Telephone Encounter (Signed)
Message copied by Lesly Rubenstein on Fri Feb 10, 2014  8:40 AM ------      Message from: [place], [place]      Created: Fri Feb 10, 2014  8:00 AM       Please move appt scheduled for 3/23 up to earliest convenient date. Abnormal left mammogram. Allow 30 minutes, as she may require u/s guided biopsy. Thanks. ------

## 2014-02-10 NOTE — Telephone Encounter (Signed)
Message left for patient to call back and see about coming in earlier to see Dr Bary Castilla due to abnormal mammogram.

## 2014-02-19 ENCOUNTER — Ambulatory Visit: Payer: Self-pay | Admitting: Hematology and Oncology

## 2014-02-20 ENCOUNTER — Telehealth: Payer: Self-pay

## 2014-02-20 NOTE — Telephone Encounter (Signed)
Left message for patient to call back to get instructions for holding her Coumadin prior to her biopsy.

## 2014-02-20 NOTE — Telephone Encounter (Signed)
Patient given instructions to stop her Coumadin after Wednesday March 4th for her scheduled biopsy.

## 2014-02-20 NOTE — Telephone Encounter (Signed)
Message copied by Lesly Rubenstein on Mon Feb 20, 2014  9:32 AM ------      Message from: Robert Bellow      Created: Sun Feb 19, 2014  8:53 AM       Please notify the patient to discontinue her coumadin after Wednesday, March 4, as she may have a biopsy completed on OV March 9th. She should continue her daily ASA. Thanks.      ----- Message -----         From: Lesly Rubenstein, LPN         Sent: 7/34[birth year]1937  10:11 AM           To: Robert Bellow, MD            She is scheduled for Monday March 9th at 3pm. She says that she takes Warfarin 2.5 mg QHS. This is prescribed by Dr Ubaldo Glassing at [place] [facility]. I told her I would call her back with any instructions from you.      ----- Message -----         From: Robert Bellow, MD         Sent: 02/10/2014   8:00 AM           To: Lesly Rubenstein, LPN            Please move appt scheduled for 3/23 up to earliest convenient date. Abnormal left mammogram. Allow 30 minutes, as she may require u/s guided biopsy. Thanks.             ------

## 2014-02-22 LAB — PROTIME-INR
INR: 2.5
Prothrombin Time: 26 secs — ABNORMAL HIGH (ref 11.5–14.7)

## 2014-02-27 ENCOUNTER — Other Ambulatory Visit: Payer: BC Managed Care – PPO

## 2014-02-27 ENCOUNTER — Encounter: Payer: Self-pay | Admitting: General Surgery

## 2014-02-27 ENCOUNTER — Ambulatory Visit (INDEPENDENT_AMBULATORY_CARE_PROVIDER_SITE_OTHER): Payer: BC Managed Care – PPO | Admitting: General Surgery

## 2014-02-27 VITALS — BP 150/82 | HR 74 | Resp 16 | Ht 64.0 in

## 2014-02-27 DIAGNOSIS — C50911 Malignant neoplasm of unspecified site of right female breast: Secondary | ICD-10-CM

## 2014-02-27 DIAGNOSIS — N6320 Unspecified lump in the left breast, unspecified quadrant: Secondary | ICD-10-CM | POA: Insufficient documentation

## 2014-02-27 DIAGNOSIS — C50919 Malignant neoplasm of unspecified site of unspecified female breast: Secondary | ICD-10-CM

## 2014-02-27 DIAGNOSIS — N63 Unspecified lump in unspecified breast: Secondary | ICD-10-CM

## 2014-02-27 NOTE — Patient Instructions (Signed)

## 2014-02-27 NOTE — Progress Notes (Signed)
Patient ID: Erin Romero, female   DOB: 28-May-1951, 63 y.o.   MRN: [id number]  Chief Complaint  Patient presents with  . Follow-up    mammogram    HPI Erin Romero is a 63 y.o. female who presents for a breast evaluation. The most recent mammogram was done on 02/02/14 and added views 02/06/14. She does reports some pain in the left breast. Patient does perform regular self breast checks and gets regular mammograms done. Her last dose of Warfarin was Tuesday,  February 21, 2014. The patient reports ongoing tenderness near the axillary area of the right breast post mastectomy making it difficult for her to complete wear a bra. The soreness in the lower aspect of the mastectomy as resolved since her last visit.  HPI  Past Medical History  Diagnosis Date  . Hypertension 2005  . Atrial fibrillation 2014  . Lump or mass in breast 2014    right mastectomy  . Fibrocystic breast disease 2005  . Malignant neoplasm of central portion of female breast 2014    stage II,T2, N1a. Mastectomy with sentinel node biopsy.    Past Surgical History  Procedure Laterality Date  . Breast surgery Right 2005,2014    biopsy  . Breast surgery Right 2014    mastectomy  . Knee surgery Left 2005  . Abdominal hysterectomy  1999  . Thumb surg Right 2004  . Tonsillectomy      age of 83  . Colonoscopy  2002    Family History  Problem Relation Age of Onset  . Cancer Other     ovarian,liver,colon cancer-no family member listed    Social History History  Substance Use Topics  . Smoking status: Never Smoker   . Smokeless tobacco: Never Used  . Alcohol Use: No    Allergies  Allergen Reactions  . Sulfa Antibiotics Hives    Current Outpatient Prescriptions  Medication Sig Dispense Refill  . acetaminophen (TYLENOL) 500 MG tablet Take 500-1,000 mg by mouth every 4 (four) hours as needed for pain (q 4-6 prn).      . ALPRAZolam (XANAX) 0.5 MG tablet Take 0.5 mg by mouth 2 (two) times daily.  Take 1/2 to 1 tablet twice a day      . amLODipine (NORVASC) 5 MG tablet Take 2.5 mg by mouth daily.       . Ascorbic Acid (VITAMIN C) 1000 MG tablet Take 1,000 mg by mouth daily.      Marland Kitchen aspirin 81 MG chewable tablet Chew 81 mg by mouth daily.      . Calcium Carb-Cholecalciferol (OYSTER SHELL CALCIUM + D PO) Take by mouth.      . Cholecalciferol (VITAMIN D-3 PO) Take 2,000 Units by mouth daily.      Marland Kitchen docusate sodium (COLACE) 100 MG capsule Take 100 mg by mouth 2 (two) times daily.      . hydrochlorothiazide (MICROZIDE) 12.5 MG capsule Take 12.5 mg by mouth daily.      Marland Kitchen letrozole (FEMARA) 2.5 MG tablet Take 2.5 mg by mouth daily.      Marland Kitchen omeprazole (PRILOSEC) 20 MG capsule Take 20 mg by mouth daily.      Marland Kitchen warfarin (COUMADIN) 2.5 MG tablet Take 2.5 mg by mouth daily.       No current facility-administered medications for this visit.    Review of Systems Review of Systems  Constitutional: Negative.   Respiratory: Negative.   Cardiovascular: Negative.     Blood pressure 150/82, pulse 74,  resp. rate 16, height 5\' 4"  (1.626 m), peak flow 232 L/min.  Physical Exam Physical Exam  Constitutional: She is oriented to person, place, and time. She appears well-developed and well-nourished.  Eyes: Conjunctivae are normal.  Neck: Neck supple.  Cardiovascular: Normal rate and normal heart sounds.   Atrial fibulation   Pulmonary/Chest: Effort normal and breath sounds normal. Left breast exhibits mass. Left breast exhibits no inverted nipple, no nipple discharge, no skin change and no tenderness.  Right mastectomy site well healed, tenderness superiorly.   Neurological: She is alert and oriented to person, place, and time.    Data Reviewed Mammogram and ultrasound dated February 06, 2014 was reviewed. An oil cyst as well as a hypoechoic area was identified for which biopsy was recommended. BI-RAD-4.  Ultrasound examination in the 12:30 o'clock position the left breast 4 cm from the nipple  showed a 0.6 x 0.63 x 0.7 cm hypoechoic area with posterior acoustic shadowing corresponding to the hospital imaging studies. The patient was amenable to vacuum biopsy. 10 cc of 0.5% Xylocaine with 0.25% Marcaine with one 200,000 epinephrine was utilized and well tolerated. A 9-gauge core biopsy device was passed under ultrasound guidance through the lesion and proximally a core samples obtained. Postbiopsy imaging showed no residual tissue. A clip was placed. Scant bleeding was noted. The skin defect was closed with benzoin and Steri-Strip followed by a Telfa and Tegaderm dressing. Instructions regarding wound care were provided.  Assessment    Focal asymmetry left breast, Potentially more prominent with decreased breast density on aromatase inhibitor therapy.     Plan    The patient will resume her Coumadin tonight. The importance of making use of ice intermittently for the next 24 hours was reviewed. Written instructions for wound care were provided. She'll be contacted once pathology is available.  The patient will return for staph evaluation one week.        Erin Romero 02/27/2014, 8:01 PM

## 2014-03-02 LAB — PATHOLOGY

## 2014-03-03 ENCOUNTER — Telehealth: Payer: Self-pay | Admitting: General Surgery

## 2014-03-03 NOTE — Telephone Encounter (Signed)
The patient was notified that the left breast biopsy completed on 02/27/2014 was benign. She reports she's had no pain since the procedure.  She will followup on 03/06/2014 for a postop wound exam with the staff.  Will arrange for a repeat mammogram in six months.

## 2014-03-06 ENCOUNTER — Encounter: Payer: Self-pay | Admitting: General Surgery

## 2014-03-06 ENCOUNTER — Ambulatory Visit (INDEPENDENT_AMBULATORY_CARE_PROVIDER_SITE_OTHER): Payer: BC Managed Care – PPO | Admitting: *Deleted

## 2014-03-06 DIAGNOSIS — C50911 Malignant neoplasm of unspecified site of right female breast: Secondary | ICD-10-CM

## 2014-03-06 DIAGNOSIS — C50919 Malignant neoplasm of unspecified site of unspecified female breast: Secondary | ICD-10-CM

## 2014-03-06 NOTE — Progress Notes (Signed)
Patient here today for follow up post left breast biopsy.  Dressing removed, steristrip in place and aware it may come off in one week.  Minimal bruising noted.  The patient is aware that a heating pad may be used for comfort as needed.  Aware of pathology. Follow up as scheduled. 

## 2014-03-13 ENCOUNTER — Ambulatory Visit: Payer: BC Managed Care – PPO | Admitting: General Surgery

## 2014-03-15 LAB — CBC CANCER CENTER
Basophil #: 0.1 x10 3/mm (ref 0.0–0.1)
Basophil %: 1 %
Eosinophil #: 0.2 x10 3/mm (ref 0.0–0.7)
Eosinophil %: 2.3 %
HCT: 39 % (ref 35.0–47.0)
HGB: 13.1 g/dL (ref 12.0–16.0)
Lymphocyte #: 1.8 x10 3/mm (ref 1.0–3.6)
Lymphocyte %: 20.8 %
MCH: 29.3 pg (ref 26.0–34.0)
MCHC: 33.6 g/dL (ref 32.0–36.0)
MCV: 87 fL (ref 80–100)
Monocyte #: 0.7 x10 3/mm (ref 0.2–0.9)
Monocyte %: 8.2 %
Neutrophil #: 5.8 x10 3/mm (ref 1.4–6.5)
Neutrophil %: 67.7 %
Platelet: 182 x10 3/mm (ref 150–440)
RBC: 4.48 10*6/uL (ref 3.80–5.20)
RDW: 15.1 % — ABNORMAL HIGH (ref 11.5–14.5)
WBC: 8.5 x10 3/mm (ref 3.6–11.0)

## 2014-03-15 LAB — BASIC METABOLIC PANEL
Anion Gap: 9 (ref 7–16)
BUN: 17 mg/dL (ref 7–18)
Calcium, Total: 8.9 mg/dL (ref 8.5–10.1)
Chloride: 103 mmol/L (ref 98–107)
Co2: 29 mmol/L (ref 21–32)
Creatinine: 0.91 mg/dL (ref 0.60–1.30)
EGFR (African American): >60
EGFR (Non-African Amer.): >60
Glucose: 137 mg/dL — ABNORMAL HIGH (ref 65–99)
Osmolality: 285 (ref 275–301)
Potassium: 3.3 mmol/L — ABNORMAL LOW (ref 3.5–5.1)
Sodium: 141 mmol/L (ref 136–145)

## 2014-03-15 LAB — PROTIME-INR
INR: 2.3
Prothrombin Time: 24.6 secs — ABNORMAL HIGH (ref 11.5–14.7)

## 2014-03-22 ENCOUNTER — Ambulatory Visit: Payer: Self-pay | Admitting: Hematology and Oncology

## 2014-04-05 LAB — PROTIME-INR
INR: 2.3
Prothrombin Time: 25.1 secs — ABNORMAL HIGH (ref 11.5–14.7)

## 2014-04-18 DIAGNOSIS — I4891 Unspecified atrial fibrillation: Secondary | ICD-10-CM | POA: Insufficient documentation

## 2014-04-21 ENCOUNTER — Ambulatory Visit: Payer: Self-pay | Admitting: Hematology and Oncology

## 2014-05-17 LAB — PROTIME-INR
INR: 2.4
Prothrombin Time: 25.5 secs — ABNORMAL HIGH (ref 11.5–14.7)

## 2014-05-22 ENCOUNTER — Ambulatory Visit: Payer: Self-pay | Admitting: Hematology and Oncology

## 2014-05-22 LAB — HEMOGLOBIN A1C: Hgb A1c MFr Bld: 4.9 % (ref 4.0–6.0)

## 2014-06-15 LAB — COMPREHENSIVE METABOLIC PANEL
Albumin: 3.5 g/dL (ref 3.4–5.0)
Alkaline Phosphatase: 70 U/L
Anion Gap: 6 — ABNORMAL LOW (ref 7–16)
BUN: 12 mg/dL (ref 7–18)
Bilirubin,Total: 0.5 mg/dL (ref 0.2–1.0)
Calcium, Total: 8.7 mg/dL (ref 8.5–10.1)
Chloride: 102 mmol/L (ref 98–107)
Co2: 33 mmol/L — ABNORMAL HIGH (ref 21–32)
Creatinine: 0.84 mg/dL (ref 0.60–1.30)
EGFR (African American): >60
EGFR (Non-African Amer.): >60
Glucose: 125 mg/dL — ABNORMAL HIGH (ref 65–99)
Osmolality: 282 (ref 275–301)
Potassium: 3.4 mmol/L — ABNORMAL LOW (ref 3.5–5.1)
SGOT(AST): 21 U/L (ref 15–37)
SGPT (ALT): 24 U/L (ref 12–78)
Sodium: 141 mmol/L (ref 136–145)
Total Protein: 7.6 g/dL (ref 6.4–8.2)

## 2014-06-15 LAB — CBC CANCER CENTER
Basophil #: 0.1 x10 3/mm (ref 0.0–0.1)
Basophil %: 1.1 %
Eosinophil #: 0.1 x10 3/mm (ref 0.0–0.7)
Eosinophil %: 1.5 %
HCT: 38.4 % (ref 35.0–47.0)
HGB: 13.3 g/dL (ref 12.0–16.0)
Lymphocyte #: 1.7 x10 3/mm (ref 1.0–3.6)
Lymphocyte %: 20.1 %
MCH: 29.7 pg (ref 26.0–34.0)
MCHC: 34.6 g/dL (ref 32.0–36.0)
MCV: 86 fL (ref 80–100)
Monocyte #: 0.6 x10 3/mm (ref 0.2–0.9)
Monocyte %: 7.2 %
Neutrophil #: 5.8 x10 3/mm (ref 1.4–6.5)
Neutrophil %: 70.1 %
Platelet: 175 x10 3/mm (ref 150–440)
RBC: 4.48 10*6/uL (ref 3.80–5.20)
RDW: 15.5 % — ABNORMAL HIGH (ref 11.5–14.5)
WBC: 8.3 x10 3/mm (ref 3.6–11.0)

## 2014-06-15 LAB — PROTIME-INR
INR: 2.2
Prothrombin Time: 23.9 secs — ABNORMAL HIGH (ref 11.5–14.7)

## 2014-06-16 LAB — CANCER ANTIGEN 27.29: CA 27.29: 27.7 U/mL (ref 0.0–38.6)

## 2014-06-21 ENCOUNTER — Ambulatory Visit: Payer: Self-pay | Admitting: Hematology and Oncology

## 2014-07-22 ENCOUNTER — Ambulatory Visit: Payer: Self-pay | Admitting: Hematology and Oncology

## 2014-08-02 LAB — PROTIME-INR
INR: 3.7
Prothrombin Time: 35.7 secs — ABNORMAL HIGH (ref 11.5–14.7)

## 2014-08-22 ENCOUNTER — Ambulatory Visit: Payer: Self-pay | Admitting: Hematology and Oncology

## 2014-08-31 ENCOUNTER — Encounter: Payer: Self-pay | Admitting: General Surgery

## 2014-09-11 ENCOUNTER — Encounter: Payer: Self-pay | Admitting: General Surgery

## 2014-09-11 ENCOUNTER — Ambulatory Visit (INDEPENDENT_AMBULATORY_CARE_PROVIDER_SITE_OTHER): Payer: BC Managed Care – PPO | Admitting: General Surgery

## 2014-09-11 VITALS — BP 130/80 | HR 76 | Resp 14 | Ht 64.0 in | Wt 233.0 lb

## 2014-09-11 DIAGNOSIS — C50919 Malignant neoplasm of unspecified site of unspecified female breast: Secondary | ICD-10-CM

## 2014-09-11 DIAGNOSIS — C50911 Malignant neoplasm of unspecified site of right female breast: Secondary | ICD-10-CM

## 2014-09-11 NOTE — Patient Instructions (Signed)
Patient to return in 6 months for follow up. Continue self breast exams. Call office for any new breast issues or concerns.

## 2014-09-11 NOTE — Progress Notes (Signed)
Patient ID: Erin Romero, female   DOB: [birth year], 63 y.o.   MRN: [id number]  Chief Complaint  Patient presents with  . Follow-up    mammmogram    HPI Erin Romero is a 63 y.o. female who presents for a breast cancer follow up. The most recent mammogram was done on 08/31/14. Patient does not perform regular self breast checks but does get regular mammograms done. The patient denies any new problems at this time.    HPI  Past Medical History  Diagnosis Date  . Hypertension 2005  . Atrial fibrillation 2014  . Lump or mass in breast 2014    right mastectomy  . Fibrocystic breast disease 2005  . Malignant neoplasm of central portion of female breast 2014    stage II,T2, N1a. Mastectomy with sentinel node biopsy. 2 sentinel nodes negative, non-sentinel node w/ 5 mm macrometastatic foci in non-sentinel node.  Received post mastectomy radiation.     Past Surgical History  Procedure Laterality Date  . Breast surgery Right 2005,2014    biopsy  . Breast surgery Right 2014    mastectomy  . Knee surgery Left 2005  . Abdominal hysterectomy  1999  . Thumb surg Right 2004  . Tonsillectomy      age of 83  . Colonoscopy  2002    Family History  Problem Relation Age of Onset  . Cancer Other     ovarian,liver,colon cancer-no family member listed    Social History History  Substance Use Topics  . Smoking status: Never Smoker   . Smokeless tobacco: Never Used  . Alcohol Use: No    Allergies  Allergen Reactions  . Sulfa Antibiotics Hives    Current Outpatient Prescriptions  Medication Sig Dispense Refill  . acetaminophen (TYLENOL) 500 MG tablet Take 500-1,000 mg by mouth every 4 (four) hours as needed for pain (q 4-6 prn).      . ALPRAZolam (XANAX) 0.5 MG tablet Take 0.5 mg by mouth 2 (two) times daily. Take 1/2 to 1 tablet twice a day      . amLODipine (NORVASC) 5 MG tablet Take 2.5 mg by mouth daily.       . Ascorbic Acid (VITAMIN C) 1000 MG tablet Take 1,000  mg by mouth daily.      Marland Kitchen aspirin 81 MG chewable tablet Chew 81 mg by mouth daily.      . bisoprolol-hydrochlorothiazide (ZIAC) 10-6.25 MG per tablet Take 1 tablet by mouth daily.      . Calcium Carb-Cholecalciferol (OYSTER SHELL CALCIUM + D PO) Take by mouth.      . Cholecalciferol (VITAMIN D-3 PO) Take 2,000 Units by mouth daily.      Marland Kitchen docusate sodium (COLACE) 100 MG capsule Take 100 mg by mouth 2 (two) times daily.      . hydrochlorothiazide (MICROZIDE) 12.5 MG capsule Take 12.5 mg by mouth daily.      Marland Kitchen letrozole (FEMARA) 2.5 MG tablet Take 2.5 mg by mouth daily.      Marland Kitchen omeprazole (PRILOSEC) 20 MG capsule Take 20 mg by mouth daily.      . traZODone (DESYREL) 50 MG tablet Take 50 mg by mouth at bedtime.      Marland Kitchen warfarin (COUMADIN) 2.5 MG tablet Take 2.5 mg by mouth daily.       No current facility-administered medications for this visit.    Review of Systems Review of Systems  Constitutional: Negative.   Respiratory: Negative.   Cardiovascular: Negative.  Blood pressure 130/80, pulse 76, resp. rate 14, height 5\' 4"  (1.626 m), weight 233 lb (105.688 kg).  Physical Exam Physical Exam  Constitutional: She is oriented to person, place, and time. She appears well-developed and well-nourished.  Neck: Neck supple. No thyromegaly present.  Cardiovascular: Normal rate, regular rhythm and normal heart sounds.   No murmur heard. Pulmonary/Chest: Effort normal and breath sounds normal. Left breast exhibits no inverted nipple, no mass, no nipple discharge, no skin change and no tenderness.  Right mastectomy site well healed.   Musculoskeletal:       Arms: Lymphadenopathy:    She has no cervical adenopathy.    She has no axillary adenopathy.  Neurological: She is alert and oriented to person, place, and time.  Skin: Skin is warm and dry.   Data: Left breast mammogram completed at [facility] dated August 31, 2014 showed no interval change. Left upper outer quadrant biopsy clip  evident. BI-RAD-2.    Assessment    No evidence of local regional recurrence of her right breast cancer.  No evidence of lymphedema. Tolerating Letrazol.    Plan    Will arrange for a followup exam In 6 months.  The patient will be assigned a medical oncologist at her followup visit later this month. She is completed her chemotherapy over one year ago. She was encouraged to discuss port removal.     PCP/Ref. MD: Etheleen Mayhew 09/11/2014, 10:00 PM

## 2014-09-20 LAB — COMPREHENSIVE METABOLIC PANEL
Albumin: 3.3 g/dL — ABNORMAL LOW (ref 3.4–5.0)
Alkaline Phosphatase: 72 U/L
Anion Gap: 8 (ref 7–16)
BUN: 13 mg/dL (ref 7–18)
Bilirubin,Total: 0.4 mg/dL (ref 0.2–1.0)
Calcium, Total: 9 mg/dL (ref 8.5–10.1)
Chloride: 103 mmol/L (ref 98–107)
Co2: 29 mmol/L (ref 21–32)
Creatinine: 0.87 mg/dL (ref 0.60–1.30)
EGFR (African American): >60
EGFR (Non-African Amer.): >60
Glucose: 123 mg/dL — ABNORMAL HIGH (ref 65–99)
Osmolality: 281 (ref 275–301)
Potassium: 3.5 mmol/L (ref 3.5–5.1)
SGOT(AST): 17 U/L (ref 15–37)
SGPT (ALT): 22 U/L
Sodium: 140 mmol/L (ref 136–145)
Total Protein: 7 g/dL (ref 6.4–8.2)

## 2014-09-20 LAB — CBC CANCER CENTER
Basophil #: 0.1 x10 3/mm (ref 0.0–0.1)
Basophil %: 0.6 %
Eosinophil #: 0.1 x10 3/mm (ref 0.0–0.7)
Eosinophil %: 1.4 %
HCT: 38.5 % (ref 35.0–47.0)
HGB: 12.8 g/dL (ref 12.0–16.0)
Lymphocyte #: 1.7 x10 3/mm (ref 1.0–3.6)
Lymphocyte %: 19.1 %
MCH: 28.9 pg (ref 26.0–34.0)
MCHC: 33.2 g/dL (ref 32.0–36.0)
MCV: 87 fL (ref 80–100)
Monocyte #: 0.6 x10 3/mm (ref 0.2–0.9)
Monocyte %: 6.8 %
Neutrophil #: 6.3 x10 3/mm (ref 1.4–6.5)
Neutrophil %: 72.1 %
Platelet: 193 x10 3/mm (ref 150–440)
RBC: 4.43 10*6/uL (ref 3.80–5.20)
RDW: 15.6 % — ABNORMAL HIGH (ref 11.5–14.5)
WBC: 8.7 x10 3/mm (ref 3.6–11.0)

## 2014-09-20 LAB — PROTIME-INR
INR: 2.6
Prothrombin Time: 27.4 secs — ABNORMAL HIGH (ref 11.5–14.7)

## 2014-09-21 ENCOUNTER — Ambulatory Visit: Payer: Self-pay | Admitting: Internal Medicine

## 2014-09-21 ENCOUNTER — Ambulatory Visit: Payer: Self-pay | Admitting: Hematology and Oncology

## 2014-09-22 LAB — CANCER ANTIGEN 27.29: CA 27.29: 29.8 U/mL (ref 0.0–38.6)

## 2014-10-02 DIAGNOSIS — F329 Major depressive disorder, single episode, unspecified: Secondary | ICD-10-CM | POA: Insufficient documentation

## 2014-10-02 DIAGNOSIS — F32A Depression, unspecified: Secondary | ICD-10-CM | POA: Insufficient documentation

## 2014-10-02 DIAGNOSIS — E7800 Pure hypercholesterolemia, unspecified: Secondary | ICD-10-CM | POA: Insufficient documentation

## 2014-10-23 ENCOUNTER — Encounter: Payer: Self-pay | Admitting: General Surgery

## 2014-11-01 ENCOUNTER — Ambulatory Visit: Payer: Self-pay | Admitting: Hematology and Oncology

## 2014-11-10 LAB — CBC AND DIFFERENTIAL
HCT: 38 % (ref 36–46)
Hemoglobin: 13.3 g/dL (ref 12.0–16.0)
Neutrophils Absolute: 6 /uL
Platelets: 204 10*3/uL (ref 150–399)
WBC: 9.4 10^3/mL

## 2014-11-10 LAB — HEPATIC FUNCTION PANEL
ALT: 13 U/L (ref 7–35)
AST: 19 U/L (ref 13–35)
Alkaline Phosphatase: 67 U/L (ref 25–125)
Bilirubin, Total: 0.5 mg/dL

## 2014-11-10 LAB — BASIC METABOLIC PANEL
BUN: 14 mg/dL (ref 4–21)
Creatinine: 0.8 mg/dL (ref 0.5–1.1)
Glucose: 107 mg/dL
Potassium: 3.6 mmol/L (ref 3.4–5.3)
Sodium: 140 mmol/L (ref 137–147)

## 2014-11-10 LAB — TSH: TSH: 2.42 u[IU]/mL (ref 0.41–5.90)

## 2014-11-21 ENCOUNTER — Ambulatory Visit: Payer: Self-pay | Admitting: Hematology and Oncology

## 2014-11-25 ENCOUNTER — Ambulatory Visit: Payer: Self-pay | Admitting: Otolaryngology

## 2014-12-20 LAB — COMPREHENSIVE METABOLIC PANEL
Albumin: 3.4 g/dL (ref 3.4–5.0)
Alkaline Phosphatase: 73 U/L
Anion Gap: 9 (ref 7–16)
BUN: 13 mg/dL (ref 7–18)
Bilirubin,Total: 0.5 mg/dL (ref 0.2–1.0)
Calcium, Total: 8.9 mg/dL (ref 8.5–10.1)
Chloride: 101 mmol/L (ref 98–107)
Co2: 32 mmol/L (ref 21–32)
Creatinine: 0.86 mg/dL (ref 0.60–1.30)
EGFR (African American): >60
EGFR (Non-African Amer.): >60
Glucose: 116 mg/dL — ABNORMAL HIGH (ref 65–99)
Osmolality: 284 (ref 275–301)
Potassium: 3.5 mmol/L (ref 3.5–5.1)
SGOT(AST): 16 U/L (ref 15–37)
SGPT (ALT): 21 U/L
Sodium: 142 mmol/L (ref 136–145)
Total Protein: 7.4 g/dL (ref 6.4–8.2)

## 2014-12-20 LAB — CBC CANCER CENTER
Basophil #: 0.1 x10 3/mm (ref 0.0–0.1)
Basophil %: 0.6 %
Eosinophil #: 0.1 x10 3/mm (ref 0.0–0.7)
Eosinophil %: 1.4 %
HCT: 38.1 % (ref 35.0–47.0)
HGB: 13.1 g/dL (ref 12.0–16.0)
Lymphocyte #: 1.7 x10 3/mm (ref 1.0–3.6)
Lymphocyte %: 18.1 %
MCH: 29.4 pg (ref 26.0–34.0)
MCHC: 34.2 g/dL (ref 32.0–36.0)
MCV: 86 fL (ref 80–100)
Monocyte #: 0.7 x10 3/mm (ref 0.2–0.9)
Monocyte %: 7.2 %
Neutrophil #: 6.7 x10 3/mm — ABNORMAL HIGH (ref 1.4–6.5)
Neutrophil %: 72.7 %
Platelet: 197 x10 3/mm (ref 150–440)
RBC: 4.44 10*6/uL (ref 3.80–5.20)
RDW: 15.5 % — ABNORMAL HIGH (ref 11.5–14.5)
WBC: 9.3 x10 3/mm (ref 3.6–11.0)

## 2014-12-20 LAB — PROTIME-INR
INR: 2.2
Prothrombin Time: 23.7 secs — ABNORMAL HIGH (ref 11.5–14.7)

## 2014-12-20 LAB — TSH: Thyroid Stimulating Horm: 1.77 u[IU]/mL

## 2014-12-22 ENCOUNTER — Ambulatory Visit: Payer: Self-pay | Admitting: Hematology and Oncology

## 2014-12-22 LAB — CANCER ANTIGEN 27.29: CA 27.29: 24.9 U/mL (ref 0.0–38.6)

## 2015-03-15 ENCOUNTER — Ambulatory Visit: Payer: Self-pay | Admitting: General Surgery

## 2015-03-27 DIAGNOSIS — I482 Chronic atrial fibrillation: Secondary | ICD-10-CM | POA: Diagnosis not present

## 2015-03-27 DIAGNOSIS — E780 Pure hypercholesterolemia: Secondary | ICD-10-CM | POA: Diagnosis not present

## 2015-03-27 DIAGNOSIS — I10 Essential (primary) hypertension: Secondary | ICD-10-CM | POA: Diagnosis not present

## 2015-03-28 ENCOUNTER — Ambulatory Visit
Admit: 2015-03-28 | Disposition: A | Discharge status: Home / Self Care | Payer: Self-pay | Attending: Hematology and Oncology | Admitting: Hematology and Oncology

## 2015-03-28 DIAGNOSIS — Z452 Encounter for adjustment and management of vascular access device: Secondary | ICD-10-CM | POA: Diagnosis not present

## 2015-03-28 DIAGNOSIS — C50911 Malignant neoplasm of unspecified site of right female breast: Secondary | ICD-10-CM | POA: Diagnosis not present

## 2015-04-04 ENCOUNTER — Encounter: Payer: Self-pay | Admitting: General Surgery

## 2015-04-04 ENCOUNTER — Ambulatory Visit (INDEPENDENT_AMBULATORY_CARE_PROVIDER_SITE_OTHER): Payer: Commercial Managed Care - HMO | Admitting: General Surgery

## 2015-04-04 VITALS — BP 142/80 | HR 78 | Resp 14 | Ht 64.0 in | Wt 229.0 lb

## 2015-04-04 DIAGNOSIS — R21 Rash and other nonspecific skin eruption: Secondary | ICD-10-CM | POA: Insufficient documentation

## 2015-04-04 DIAGNOSIS — C50911 Malignant neoplasm of unspecified site of right female breast: Secondary | ICD-10-CM | POA: Diagnosis not present

## 2015-04-04 DIAGNOSIS — I480 Paroxysmal atrial fibrillation: Secondary | ICD-10-CM | POA: Diagnosis not present

## 2015-04-04 NOTE — Progress Notes (Signed)
Patient ID: Erin Romero, female   DOB: 1951/01/22, 64 y.o.   MRN: [id number]  Chief Complaint  Patient presents with  . Follow-up    breast check    HPI Erin Romero is a 64 y.o. female here today for her six month follow up breast check. Patient states she has a bad rash under her left breast, started over a week ago. She is nystatin cream three times daily. HPI  Past Medical History  Diagnosis Date  . Hypertension 2005  . Atrial fibrillation 2014  . Lump or mass in breast 2014    right mastectomy  . Fibrocystic breast disease 2005  . Malignant neoplasm of central portion of female breast January 24, 2013    stage II,T2, N1a. Mastectomy with sentinel node biopsy. 2 sentinel nodes negative, non-sentinel node w/ 5 mm macrometastatic foci in non-sentinel node.  Received post mastectomy radiation.     Past Surgical History  Procedure Laterality Date  . Breast surgery Right 2005,2014    biopsy  . Breast surgery Right 2014    mastectomy  . Knee surgery Left 2005  . Abdominal hysterectomy  1999  . Thumb surg Right 2004  . Tonsillectomy      age of 39  . Colonoscopy  2002    Family History  Problem Relation Age of Onset  . Cancer Other     ovarian,liver,colon cancer-no family member listed    Social History History  Substance Use Topics  . Smoking status: Never Smoker   . Smokeless tobacco: Never Used  . Alcohol Use: No    Allergies  Allergen Reactions  . Sulfa Antibiotics Hives    Current Outpatient Prescriptions  Medication Sig Dispense Refill  . acetaminophen (TYLENOL) 500 MG tablet Take 500-1,000 mg by mouth every 4 (four) hours as needed for pain (q 4-6 prn).    . ALPRAZolam (XANAX) 0.5 MG tablet Take 0.5 mg by mouth 2 (two) times daily. Take 1/2 to 1 tablet twice a day    . amLODipine (NORVASC) 5 MG tablet Take 2.5 mg by mouth daily.     . Ascorbic Acid (VITAMIN C) 1000 MG tablet Take 1,000 mg by mouth daily.    Marland Kitchen aspirin 81 MG chewable tablet  Chew 81 mg by mouth daily.    . bisoprolol-hydrochlorothiazide (ZIAC) 10-6.25 MG per tablet Take 1 tablet by mouth daily.    . Calcium Carb-Cholecalciferol (OYSTER SHELL CALCIUM + D PO) Take by mouth.    . Cholecalciferol (VITAMIN D-3 PO) Take 2,000 Units by mouth daily.    Marland Kitchen docusate sodium (COLACE) 100 MG capsule Take 100 mg by mouth 2 (two) times daily.    . hydrochlorothiazide (MICROZIDE) 12.5 MG capsule Take 12.5 mg by mouth daily.    Marland Kitchen letrozole (FEMARA) 2.5 MG tablet Take 2.5 mg by mouth daily.    Marland Kitchen omeprazole (PRILOSEC) 20 MG capsule Take 20 mg by mouth daily.    . traZODone (DESYREL) 50 MG tablet Take 50 mg by mouth at bedtime.    Marland Kitchen warfarin (COUMADIN) 2.5 MG tablet Take 2 mg by mouth daily.     Marland Kitchen nystatin cream (MYCOSTATIN)   0   No current facility-administered medications for this visit.    Review of Systems Review of Systems  Constitutional: Negative.   Respiratory: Negative.   Cardiovascular: Negative.     Blood pressure 142/80, pulse 78, resp. rate 14, height 5\' 4"  (1.626 m), weight 229 lb (103.874 kg).  Physical Exam Physical Exam  Constitutional: She is oriented to person, place, and time. She appears well-developed and well-nourished.  Cardiovascular: Normal rate and normal heart sounds.  An irregular rhythm present.  Pulmonary/Chest: Effort normal and breath sounds normal. Left breast exhibits no inverted nipple, no mass, no nipple discharge, no skin change and no tenderness.    Right mastectomy site well healed scar. Left breast rash.   Musculoskeletal:       Arms: Neurological: She is alert and oriented to person, place, and time.  Skin: Skin is warm and dry.     Assessment    Doing well status post right mastectomy followed by chest wall radiation.  Early telangiectasia.  No evidence of lymphedema.    Plan       The patient has been asked to return to the office in six monnths with a left sreening mammogram.  The patient reports her husband  had a genital rash a week or 2 prior to her development of the left inframammary rash. Careful hand washing was emphasized. She reports that she perspires heavily, and she has a large pendulous breasts. Once the skin heals, she may benefit from the application of deodorant to this area to minimize dampness.  PCP:  Etheleen Mayhew 04/04/2015, 11:16 AM

## 2015-04-04 NOTE — Patient Instructions (Signed)
The patient has been asked to return to the office in six monnths with a left sreening mammogram

## 2015-04-12 LAB — CREATININE, SERUM: Creatine, Serum: 0.91

## 2015-04-13 DIAGNOSIS — E859 Amyloidosis, unspecified: Secondary | ICD-10-CM | POA: Diagnosis not present

## 2015-04-13 DIAGNOSIS — R42 Dizziness and giddiness: Secondary | ICD-10-CM | POA: Diagnosis not present

## 2015-04-13 DIAGNOSIS — B354 Tinea corporis: Secondary | ICD-10-CM | POA: Diagnosis not present

## 2015-04-13 NOTE — Op Note (Signed)
PATIENT NAME:  Erin Romero, Erin Romero MR#:  [id number] DATE OF BIRTH:  [birth year]  DATE OF PROCEDURE:  03/07/2013  PREOPERATIVE DIAGNOSIS: Breast cancer, need for central venous access.   POSTOPERATIVE DIAGNOSIS: Breast cancer, need for central venous access.   OPERATIVE PROCEDURE: Left subclavian power port placement with ultrasound and fluoroscopy guidance.   SURGEON: Hervey Ard, MD   ANESTHESIA: Attended local, 15 mL 1% plain Xylocaine.   ESTIMATED BLOOD LOSS: Minimal.   CLINICAL NOTE: This 64 year old woman has a stage II carcinoma of the breast and is planned for adjuvant chemotherapy. Central venous access was requested by her treating oncologist.   DESCRIPTION OF PROCEDURE:  The patient received Kefzol prior to the procedure. The left chest and neck was prepped with ChloraPrep and draped. Ultrasound was used to confirm patency of the subclavian vein. Under ultrasound guidance, the vein was cannulated followed by placement of the guidewire. Dilator and catheter advancement was then undertaken. Fluoroscopy confirmed catheter was in the distal SVC just above the right atrium. The catheter was then tunneled to a pocket on the left anterior chest wall. The catheter easily irrigated and aspirated in the supine position. The catheter was anchored and the port was anchored in 3 points with 3-0 Prolene to the deep tissue. The wound was closed in layers with running 3-0 Vicryl deep and then running 4-0 Vicryl subcuticular suture for the skin. Benzoin, Steri-Strips, Telfa and Tegaderm dressing was then applied.        Erect portable chest x-ray in the recovery room showed the catheter tip as described above with no evidence of pneumothorax. The patient tolerated the procedure well. ____________________________ Robert Bellow, MD jwb:sb D: 03/07/2013 10:52:16 ET T: 03/07/2013 11:13:36 ET JOB#: [id number]  cc: Robert Bellow, MD, <Dictator> Rae Halsted. Kallie Edward, MD Migel Hannis Amedeo Kinsman  MD ELECTRONICALLY SIGNED 03/07/2013 19:38

## 2015-04-13 NOTE — Op Note (Signed)
PATIENT NAME:  Erin Romero, Erin Romero MR#:  [id number] DATE OF BIRTH:  1951-10-08  DATE OF PROCEDURE:  01/24/2013  PREOPERATIVE DIAGNOSIS: Right breast cancer.   POSTOPERATIVE DIAGNOSIS: Right breast cancer.   OPERATIVE PROCEDURE: Right simple mastectomy with sentinel node biopsy.   SURGEON: Hervey Ard, MD  ANESTHESIA: General endotracheal under Dr. Boston Service.   ESTIMATED BLOOD LOSS: 25 mL.   CLINICAL NOTE: This 64 year old woman was noted to have nipple retraction and a mass effect on her mammograms. Core biopsy showed evidence of invasive mammary carcinoma. She elected to proceed with mastectomy. She received Kefzol prior to the procedure. Her oral anticoagulation was held for 5 days prior to the procedure. Bridging therapy was not indicated based on discussion with her cardiologist.   The patient received Kefzol intravenously prior to incision.   DESCRIPTION OF PROCEDURE: With the patient under adequate general endotracheal anesthesia, the area of the breast was prepped with alcohol and a total of 6 mL of methylene blue diluted 1:2 with normal saline was injected in the subareolar plexus. She had previously undergone injection with technetium sulfur colloid 2 hours earlier. The breast, chest and axilla were then prepped with ChloraPrep and draped. After outlining an elliptical incision, the flaps were raised making use of electrocautery. The skin was incised sharply and the remaining dissection completed with electrocautery. The borders of dissection were the sternum medially, clavicle superiorly, serratus fascia laterally and the rectus fascia inferiorly. After the superior flap had been elevated the gamma finder was used to identify 2 hot blue nodes. An adjacent non-hot, non-blue node was also seen. The 2 sentinel nodes were sent and touch preps reported negative for metastatic disease. The nonsentinel node was sent for routine histology. Palpation through the axilla showed no  additional areas of concern. The breast was removed from the underlying pectoralis muscle taking the fascia of that muscle with the specimen. Hemostasis was with electrocautery and 3-0 Vicryl ties. The wound was then irrigated with sterile water. After assuring good hemostasis, the skin flaps were approximated with running 2-0 Vicryl deep dermal sutures in multiple segments. Prior to closure a 15-gauge Blake drain was brought out through a lateral inferior incision and anchored in place with 3-0 nylon. This was later placed to self-suction. The skin was reapproximated with benzoin and Steri-Strips. Telfa pad, fluffed gauze, Kerlix and an Ace wrap were then applied.   The patient tolerated the procedure well and was taken to the recovery room in stable condition.  ____________________________ Robert Bellow, MD jwb:sb D: 01/24/2013 13:11:55 ET T: 01/24/2013 13:59:15 ET JOB#: [id number]  cc: Robert Bellow, MD, <Dictator> Jerrell Belfast, MD Treylon Henard Amedeo Kinsman MD ELECTRONICALLY SIGNED 01/27/2013 13:40

## 2015-04-13 NOTE — Consult Note (Signed)
Reason for Visit: This 64 year old Female patient presents to the clinic for initial evaluation of  breast cancer .   Referred by Dr. Hervey Ard.  Diagnosis:  Chief Complaint/Diagnosis   64 year old female status post right modified radical mastectomy for invasive ductal carcinoma pathologic stage stage IIB (T2, N1, M0) ER/PR positive HER-2/neu not overexpressed  Pathology Report pathology report reviewed   Imaging Report mammograms and ultrasound reviewed   Referral Report clinical notes reviewed   Planned Treatment Regimen adjuvant chemotherapy and adjuvant radiation therapy   HPI   patient is a 64 year old femalewho presented with an abnormal mammogram of her right breast. Mammogram and ultrasound showed a 2.5 cm mass concerning for malignancy. This was in the subareolar region.she underwent biopsy which was positive for invasive ductal carcinoma. She opted for right modified radical mastectomy. Tumor was 3 cm in greatest dimension. 2 sentinel lymph nodes and additional lymph node were examined the additional node had a region ofmetastatic focus with extracapsular extension. Margins were clear at 6 mm deep. Tumor was overall grade 2. The single node was positive metastasis had a 0.5 cm focus of metastatic disease with extracapsular extension.tumor was ER/PR positive HER-2/neu not overexpressed. She is slowly healing postoperatively. I been asked for my opinion regarding postoperative adjuvant treatment. Decision whether to do further axillary lymph node dissection has been proposed. I discussed the case with Dr. Tollie Pizza and Dr. Kallie Edward  Past Hx:    Hypertension:    Atrial Fibrillation:    Herpes:    Breast cancer:    left knee arthroscopy:    Pin in right thumb:    Hysterectomy - Total:    Mastectomy:   Past, Family and Social History:  Past Medical History positive   Cardiovascular atrial fibrillation   Infections herpes   Past Surgical History left knee  arthroscopic, pain in right thumb, total hysterectomy   Family History noncontributory   Social History noncontributory   Additional Past Medical and Surgical History accompanied by her husband todayand will report the Artemis Loyal in the distal [place] or chills is 1/2 quarts for a   Allergies:   Sulfa: Rash  Home Meds:  Home Medications: Medication Instructions Status  warfarin 2.5 mg oral tablet 1 tab(s) orally once a day (at bedtime) Active  acetaminophen 325 mg oral tablet 2 tab(s) orally every 4 hours, As needed, pain or temp. greater than 100.4 Active  acetaminophen-hydrocodone 325 mg-5 mg oral tablet 1 tab(s) orally every 4 to 6 hours, As needed, pain Active  Vitamin C 1000 mg oral tablet 1 tab(s) orally once a day Active  Vitamin D3 2000 intl units oral tablet 1 tab(s) orally once a day Active  omeprazole 20 mg oral delayed release tablet 1 tab(s) orally once a day (in the morning) Active  Aspirin Low Dose 81 mg oral delayed release tablet 1 tab(s) orally once a day Active  bisoprolol-hydrochlorothiazide 10 mg-6.25 mg oral tablet 1 tab(s) orally once a day Active  hydrochlorothiazide 12.5 mg oral tablet 1 tab(s) orally once a day Active  lisinopril 20 mg oral tablet 1 tab(s) orally once a day (in the morning) Active  amlodipine 5 mg oral tablet 1 tab(s) orally once a day (in the morning) Active  alprazolam 0.5 mg oral tablet 0.5 tab(s) orally 3 times a day Active  Calcium 600+D 1 tab(s) orally once a day Active   Review of Systems:  General negative   Performance Status (ECOG) 0   Skin negative   Breast  see HPI   Ophthalmologic negative   ENMT negative   Respiratory and Thorax negative   Cardiovascular negative   Gastrointestinal negative   Genitourinary negative   Musculoskeletal negative   Neurological negative   Psychiatric negative   Hematology/Lymphatics negative   Endocrine negative   Allergic/Immunologic negative   Nursing Notes:  Nursing  Vital Signs and Chemo Nursing Nursing Notes: *CC Vital Signs Flowsheet:   21-Feb-14 11:19  Temp Temperature 98.3  Pulse Pulse 105  Respirations Respirations 20  SBP SBP 144  DBP DBP 89  Current Weight (kg) (kg) 101.3  Height (cm) centimeters 162  BSA (m2) 2   Physical Exam:  General/Skin/HEENT:  General normal   Skin normal   Eyes normal   ENMT normal   Additional PE well-developed slightly obese female in NAD. Lungs are clear to A&P. She has an irregular irregular heart rate consistent with atrial fibrillation. She status post right modified radical mastectomy. Skin in incision is healing well. Left breast is free of dominant mass or nodularity into position examined. No palpable axillary or subclavicular adenopathy is noted.   Breasts/Resp/CV/GI/GU:  Respiratory and Thorax normal   Cardiovascular normal   Gastrointestinal normal   Genitourinary normal   MS/Neuro/Psych/Lymph:  Musculoskeletal normal   Neurological normal   Lymphatics normal   Other Results:  Radiology Results: [place]:    10-Jan-14 08:57, US Breast Right  US Breast Right   REASON FOR EXAM:    RT BRST NODULE 9 OCLOCK AND NIPPLE RETRACTION AND YRLY  COMMENTS:       PROCEDURE: [place]  - US BREAST RIGHT  - Dec 31 2012  8:57AM     RESULT:     Findings: The retroareolar region of the right breast was evaluated. At   the 9:00 position a 2.59 x 1.59 x 2.35 cm, solid appearing, irregularly   bordered, hypoechoic mass with acoustic shadowing and vascularity is   identified. This is the dominant appearing mass and appears to be in a   region of previous iatrogenic intervention. Apost biopsy clip is   identified within the mass on the mammographic view. A second, smaller   nodule 2 cm from the nipple is identified measuring 1.39 x 1.02 x 1.06 cm   and demonstrates a heterogeneous echotexture with multiloculated     component.The borders are irregular and this nodule appears taller than   wider. There is  minimal vascularity associated with this nodule which has   an indeterminate to suspicious appearance considering the close vicinity   of the dominant mass already described. The dimensions of this mass again   are 1.39 x 1.02 x 1.06 cm. This nodule projects at the 9:00 position. A   third nodule is appreciated at the 10:00 position demonstrating a   hypoechoic though slightly complex architecture and measures 0.55 x 0.32   x 0.42 cm. The borders are somewhat lobulated and this nodule has the   appearance of a complex cystic nodule. There is peripheral vascularity   associated with this nodule.    IMPRESSION:      1.  Dominant highly suspicious nodule at the 9:00 position in the region   of prior iatrogenic intervention. Further evaluation with surgical     consultation is recommended.  2.  Two, smaller indeterminate satellite nodules as described above.  3.  Please refer to the additional radiographic view dictation for   complete discussion.      Thank you for this opportunity to contribute to  the care of your patient.     Verified By: Mikki Santee, M.D., MD  LabUnknown:    10-Jan-14 08:14, Digital Diagnostic Mammogram Bilateral  PACS Image     10-Jan-14 08:57, US Breast Right  PACS [place]:    10-Jan-14 08:14, Digital Diagnostic Mammogram Bilateral  Digital Diagnostic Mammogram Bilateral   REASON FOR EXAM:    RT BRST NODULE 9 OCLOCK AND NIPPLE RETRACTION AND YRLY  COMMENTS:       PROCEDURE: MAM - MAM DGTL DIAGNOSTIC MAMMO W/CAD  - Dec 31 2012  8:14AM     RESULT: Bilateral digital diagnostic mammogram with CAD dated 12/31/2012   comparison made to prior studies dated 09/08/2006 09/10/1999 A    Findings: BREAST COMPOSITION: The breast composition is SCATTERED   FIBROGLANDULAR TISSUE (glandular tissue is 25-50%)    An area of asymmetric spiculated density projects in the retrorenal   portion of the right breast in a slightly lateral slightly superior    location. When compared to previous studies this area has increased in   size and density. A postbiopsy clip is appreciated within this Mass and     the radiographic findings are concerning. These sonographic findings   demonstrate an irregularly bordered hypoechoic mass with acoustic   shadowing and peripheral vascularity. The mass measures 2.5, 1.59 x 2.35   centimeters. Component of these findings likely represent postsurgical   radial scarring. The increased size, density and spiculation are all   concerning. Two smaller indeterminate satellite nodules also identified   in the retrorenal portion of the right breast.    Evaluation of left breast which is asymmetric density within the lateral   portion of the breast which are compared previous studies appears stable.    IMPRESSION:  BI-RADS: Category 4 - Suspicious Abnormality - Biopsy Should   Be Considered      Verified By: Mikki Santee, M.D., MD   Relevent Results:   Relevant Scans and Labs mammograms ultrasound reviewed   Assessment and Plan: Impression:   interesting case of patient with positive axillary node on sentinel node biopsy status post modified radical mastectomy for ER/PR positive invasive ductal carcinoma. Setting literature there is really no significant overall clinical benefit to going ahead with further axillary node dissection. Would worry however that there might be further gross lymph nodes in the axilla. Setting literature  A subsequent meta-analysis of three randomized trials comparing axillary dissection versus no dissection published between 2000 and 2007 as well as a 4th trial comparing axillary radiotherapy versus no axillary therapy found no difference in overall survival, metastases, or ipsilateral breast recurrence associated with axillary treatment [63]. This may be attributable to the widespread use of adjuvant chemotherapy and radiation therapy during this time frame, especially since breast  radiation for breast conservation includes at least the low axillary field.believe a PET/CT scan may be of benefit to rule out possibility of bulky disease in her right axilla. Should this be normal as I would expect I believe we can proceed to adjuvant chemotherapy followed by right chest wall peripheral lymphatic radiation. Also encourage but ER/PR positive nature of the lesion and overall grade. Risks and benefits of treatment were explained in detail to the patient and her husband. We'll followup with her after completion of her PET/CT scan. I again discussed the case personally with Dr. Tollie Pizza and Dr. Kallie Edward  I would like to take this opportunity to thank you for allowing me to continue to participate  in this patient's care.    CC Referral:  cc: Dr. Hervey Ard, Dr. Margarita Rana   Electronic Signatures: Baruch Gouty Roda Shutters (MD)  (Signed 21-Feb-14 13:29)  Authored: HPI, Diagnosis, Past Hx, PFSH, Allergies, Home Meds, ROS, Nursing Notes, Physical Exam, Other Results, Relevent Results, Encounter Assessment and Plan, CC Referring Physician   Last Updated: 21-Feb-14 13:29 by Armstead Peaks (MD)

## 2015-05-02 DIAGNOSIS — I480 Paroxysmal atrial fibrillation: Secondary | ICD-10-CM | POA: Diagnosis not present

## 2015-05-10 DIAGNOSIS — A6000 Herpesviral infection of urogenital system, unspecified: Secondary | ICD-10-CM | POA: Insufficient documentation

## 2015-05-10 DIAGNOSIS — F419 Anxiety disorder, unspecified: Secondary | ICD-10-CM | POA: Insufficient documentation

## 2015-05-10 DIAGNOSIS — N6019 Diffuse cystic mastopathy of unspecified breast: Secondary | ICD-10-CM | POA: Insufficient documentation

## 2015-05-10 DIAGNOSIS — E559 Vitamin D deficiency, unspecified: Secondary | ICD-10-CM | POA: Insufficient documentation

## 2015-05-10 DIAGNOSIS — M1990 Unspecified osteoarthritis, unspecified site: Secondary | ICD-10-CM | POA: Insufficient documentation

## 2015-05-10 DIAGNOSIS — I10 Essential (primary) hypertension: Secondary | ICD-10-CM | POA: Insufficient documentation

## 2015-05-10 DIAGNOSIS — G43909 Migraine, unspecified, not intractable, without status migrainosus: Secondary | ICD-10-CM | POA: Insufficient documentation

## 2015-05-10 DIAGNOSIS — F4320 Adjustment disorder, unspecified: Secondary | ICD-10-CM | POA: Insufficient documentation

## 2015-05-10 DIAGNOSIS — G4700 Insomnia, unspecified: Secondary | ICD-10-CM | POA: Insufficient documentation

## 2015-05-10 DIAGNOSIS — R739 Hyperglycemia, unspecified: Secondary | ICD-10-CM | POA: Insufficient documentation

## 2015-05-10 DIAGNOSIS — Q394 Esophageal web: Secondary | ICD-10-CM | POA: Insufficient documentation

## 2015-05-17 ENCOUNTER — Other Ambulatory Visit: Payer: Self-pay

## 2015-05-17 DIAGNOSIS — C50911 Malignant neoplasm of unspecified site of right female breast: Secondary | ICD-10-CM

## 2015-05-22 ENCOUNTER — Other Ambulatory Visit: Payer: Self-pay

## 2015-05-22 ENCOUNTER — Encounter: Payer: Self-pay | Admitting: Hematology and Oncology

## 2015-05-22 ENCOUNTER — Inpatient Hospital Stay: Payer: Commercial Managed Care - HMO

## 2015-05-22 ENCOUNTER — Inpatient Hospital Stay: Payer: Commercial Managed Care - HMO | Attending: Hematology and Oncology | Admitting: Hematology and Oncology

## 2015-05-22 ENCOUNTER — Ambulatory Visit: Payer: Self-pay | Admitting: Hematology and Oncology

## 2015-05-22 ENCOUNTER — Encounter (INDEPENDENT_AMBULATORY_CARE_PROVIDER_SITE_OTHER): Payer: Self-pay

## 2015-05-22 VITALS — BP 143/91 | HR 81 | Temp 98.4°F | Resp 19 | Ht 64.0 in | Wt 230.3 lb

## 2015-05-22 DIAGNOSIS — Z9011 Acquired absence of right breast and nipple: Secondary | ICD-10-CM

## 2015-05-22 DIAGNOSIS — Z923 Personal history of irradiation: Secondary | ICD-10-CM | POA: Diagnosis not present

## 2015-05-22 DIAGNOSIS — R5383 Other fatigue: Secondary | ICD-10-CM

## 2015-05-22 DIAGNOSIS — C50012 Malignant neoplasm of nipple and areola, left female breast: Secondary | ICD-10-CM

## 2015-05-22 DIAGNOSIS — Z79899 Other long term (current) drug therapy: Secondary | ICD-10-CM | POA: Diagnosis not present

## 2015-05-22 DIAGNOSIS — R232 Flushing: Secondary | ICD-10-CM | POA: Insufficient documentation

## 2015-05-22 DIAGNOSIS — Z7982 Long term (current) use of aspirin: Secondary | ICD-10-CM

## 2015-05-22 DIAGNOSIS — I10 Essential (primary) hypertension: Secondary | ICD-10-CM

## 2015-05-22 DIAGNOSIS — K219 Gastro-esophageal reflux disease without esophagitis: Secondary | ICD-10-CM | POA: Diagnosis not present

## 2015-05-22 DIAGNOSIS — L0889 Other specified local infections of the skin and subcutaneous tissue: Secondary | ICD-10-CM | POA: Insufficient documentation

## 2015-05-22 DIAGNOSIS — C50911 Malignant neoplasm of unspecified site of right female breast: Secondary | ICD-10-CM

## 2015-05-22 DIAGNOSIS — Z79811 Long term (current) use of aromatase inhibitors: Secondary | ICD-10-CM | POA: Diagnosis not present

## 2015-05-22 DIAGNOSIS — F419 Anxiety disorder, unspecified: Secondary | ICD-10-CM | POA: Diagnosis not present

## 2015-05-22 DIAGNOSIS — I480 Paroxysmal atrial fibrillation: Secondary | ICD-10-CM | POA: Diagnosis not present

## 2015-05-22 DIAGNOSIS — R531 Weakness: Secondary | ICD-10-CM | POA: Diagnosis not present

## 2015-05-22 DIAGNOSIS — I4891 Unspecified atrial fibrillation: Secondary | ICD-10-CM | POA: Insufficient documentation

## 2015-05-22 DIAGNOSIS — C50011 Malignant neoplasm of nipple and areola, right female breast: Secondary | ICD-10-CM | POA: Diagnosis not present

## 2015-05-22 DIAGNOSIS — Z9221 Personal history of antineoplastic chemotherapy: Secondary | ICD-10-CM | POA: Insufficient documentation

## 2015-05-22 DIAGNOSIS — Z7901 Long term (current) use of anticoagulants: Secondary | ICD-10-CM | POA: Insufficient documentation

## 2015-05-22 DIAGNOSIS — Z170 Estrogen receptor positive status [ER+]: Secondary | ICD-10-CM | POA: Diagnosis not present

## 2015-05-22 LAB — HEPATIC FUNCTION PANEL
ALT: 17 U/L (ref 14–54)
AST: 23 U/L (ref 15–41)
Albumin: 3.8 g/dL (ref 3.5–5.0)
Alkaline Phosphatase: 69 U/L (ref 38–126)
Bilirubin, Direct: 0.1 mg/dL (ref 0.1–0.5)
Indirect Bilirubin: 0.6 mg/dL (ref 0.3–0.9)
Total Bilirubin: 0.7 mg/dL (ref 0.3–1.2)
Total Protein: 7.5 g/dL (ref 6.5–8.1)

## 2015-05-22 LAB — CBC
HCT: 40.1 % (ref 35.0–47.0)
Hemoglobin: 13.5 g/dL (ref 12.0–16.0)
MCH: 29.3 pg (ref 26.0–34.0)
MCHC: 33.8 g/dL (ref 32.0–36.0)
MCV: 86.8 fL (ref 80.0–100.0)
Platelets: 207 10*3/uL (ref 150–440)
RBC: 4.62 MIL/uL (ref 3.80–5.20)
RDW: 15.6 % — ABNORMAL HIGH (ref 11.5–14.5)
WBC: 10 10*3/uL (ref 3.6–11.0)

## 2015-05-23 LAB — CANCER ANTIGEN 27.29: CA 27.29: 28.8 U/mL (ref 0.0–38.6)

## 2015-05-24 ENCOUNTER — Other Ambulatory Visit: Payer: Self-pay | Admitting: Hematology and Oncology

## 2015-05-24 ENCOUNTER — Telehealth: Payer: Self-pay | Admitting: *Deleted

## 2015-05-24 MED ORDER — LETROZOLE 2.5 MG PO TABS: 2.5000 mg | ORAL_TABLET | Freq: Every day | ORAL | Status: DC

## 2015-05-24 NOTE — Telephone Encounter (Signed)
Escribed

## 2015-06-05 ENCOUNTER — Telehealth: Payer: Self-pay | Admitting: Family Medicine

## 2015-06-05 ENCOUNTER — Ambulatory Visit
Admission: RE | Admit: 2015-06-05 | Discharge: 2015-06-05 | Disposition: A | Discharge status: Home / Self Care | Payer: Commercial Managed Care - HMO | Source: Ambulatory Visit | Attending: Hematology and Oncology | Admitting: Hematology and Oncology

## 2015-06-05 DIAGNOSIS — M8580 Other specified disorders of bone density and structure, unspecified site: Secondary | ICD-10-CM | POA: Insufficient documentation

## 2015-06-05 DIAGNOSIS — C50012 Malignant neoplasm of nipple and areola, left female breast: Secondary | ICD-10-CM | POA: Insufficient documentation

## 2015-06-05 DIAGNOSIS — Z780 Asymptomatic menopausal state: Secondary | ICD-10-CM | POA: Diagnosis not present

## 2015-06-05 DIAGNOSIS — M810 Age-related osteoporosis without current pathological fracture: Secondary | ICD-10-CM | POA: Insufficient documentation

## 2015-06-05 DIAGNOSIS — M8589 Other specified disorders of bone density and structure, multiple sites: Secondary | ICD-10-CM | POA: Diagnosis not present

## 2015-06-05 NOTE — Telephone Encounter (Signed)
Pt would like to get handicap sticker.Her husband passed away and she does not want to use his because she is afraid of getting fined.Does she need office visit ?

## 2015-06-05 NOTE — Telephone Encounter (Signed)
Ok to fill out handicapped sticker and notify patient that it is ready.  Thanks.

## 2015-06-19 DIAGNOSIS — I480 Paroxysmal atrial fibrillation: Secondary | ICD-10-CM | POA: Diagnosis not present

## 2015-07-03 ENCOUNTER — Inpatient Hospital Stay: Payer: Commercial Managed Care - HMO | Attending: Hematology and Oncology

## 2015-07-03 DIAGNOSIS — C50911 Malignant neoplasm of unspecified site of right female breast: Secondary | ICD-10-CM | POA: Insufficient documentation

## 2015-07-03 DIAGNOSIS — Z452 Encounter for adjustment and management of vascular access device: Secondary | ICD-10-CM | POA: Insufficient documentation

## 2015-07-09 ENCOUNTER — Other Ambulatory Visit: Payer: Self-pay | Admitting: Hematology and Oncology

## 2015-07-17 ENCOUNTER — Inpatient Hospital Stay: Payer: Commercial Managed Care - HMO

## 2015-07-17 DIAGNOSIS — C50911 Malignant neoplasm of unspecified site of right female breast: Secondary | ICD-10-CM | POA: Diagnosis not present

## 2015-07-17 DIAGNOSIS — I480 Paroxysmal atrial fibrillation: Secondary | ICD-10-CM | POA: Diagnosis not present

## 2015-07-17 DIAGNOSIS — Z452 Encounter for adjustment and management of vascular access device: Secondary | ICD-10-CM | POA: Diagnosis not present

## 2015-07-17 DIAGNOSIS — C801 Malignant (primary) neoplasm, unspecified: Secondary | ICD-10-CM

## 2015-07-17 MED ORDER — SODIUM CHLORIDE 0.9 % IJ SOLN
10.0000 mL | Freq: Once | INTRAMUSCULAR | Status: AC
  Administered 2015-07-17: 10 mL via INTRAVENOUS
  Filled 2015-07-17: qty 10

## 2015-07-17 MED ORDER — HEPARIN SOD (PORK) LOCK FLUSH 100 UNIT/ML IV SOLN
INTRAVENOUS | Status: AC
  Filled 2015-07-17: qty 5

## 2015-07-17 MED ORDER — HEPARIN SOD (PORK) LOCK FLUSH 100 UNIT/ML IV SOLN
500.0000 [IU] | Freq: Once | INTRAVENOUS | Status: AC
Start: 2015-07-17 — End: 2015-07-17
  Administered 2015-07-17: 500 [IU] via INTRAVENOUS

## 2015-07-26 ENCOUNTER — Encounter: Payer: Self-pay | Admitting: Family Medicine

## 2015-07-26 ENCOUNTER — Ambulatory Visit (INDEPENDENT_AMBULATORY_CARE_PROVIDER_SITE_OTHER): Payer: Commercial Managed Care - HMO | Admitting: Family Medicine

## 2015-07-26 VITALS — BP 124/88 | HR 84 | Temp 98.4°F | Resp 16 | Ht 64.5 in | Wt 232.0 lb

## 2015-07-26 DIAGNOSIS — I4891 Unspecified atrial fibrillation: Secondary | ICD-10-CM | POA: Diagnosis not present

## 2015-07-26 DIAGNOSIS — R310 Gross hematuria: Secondary | ICD-10-CM | POA: Diagnosis not present

## 2015-07-26 DIAGNOSIS — Z0000 Encounter for general adult medical examination without abnormal findings: Secondary | ICD-10-CM

## 2015-07-26 DIAGNOSIS — N3090 Cystitis, unspecified without hematuria: Secondary | ICD-10-CM | POA: Diagnosis not present

## 2015-07-26 LAB — POCT URINALYSIS DIPSTICK
Bilirubin, UA: NEGATIVE
Glucose, UA: NEGATIVE
Ketones, UA: NEGATIVE
Nitrite, UA: NEGATIVE
Protein, UA: 300
Spec Grav, UA: 1.02
Urobilinogen, UA: 0.2
pH, UA: 7.5

## 2015-07-26 LAB — POCT INR
INR: 40.8
PT: 3.4

## 2015-07-26 MED ORDER — CIPROFLOXACIN HCL 500 MG PO TABS: 500.0000 mg | ORAL_TABLET | Freq: Two times a day (BID) | ORAL | Status: DC

## 2015-07-26 NOTE — Progress Notes (Deleted)
Patient: Erin Romero, Female    DOB: 09-10-1951, 64 y.o.   MRN: [id number] Visit Date: 07/26/2015  Today's Provider: Margarita Rana, MD   Chief Complaint  Patient presents with  . Medicare Wellness    Welcome To Medicare   Subjective:    Annual wellness visit [facility] Erin Romero is a 64 y.o. female. She feels poorly. She reports exercising never. She reports she is sleeping poorly.  -----------------------------------------------------------   Review of Systems  History   Social History  . Marital Status: Widowed    Spouse Name: Erin Romero  . Number of Children: 0  . Years of Education: 13   Occupational History  .  Unemployed    disabilty from breast cancer   Social History Main Topics  . Smoking status: Never Smoker   . Smokeless tobacco: Never Used  . Alcohol Use: No  . Drug Use: No  . Sexual Activity: Not on file   Other Topics Concern  . Not on file   Social History Narrative    Patient Active Problem List   Diagnosis Date Noted  . Adaptation reaction 05/10/2015  . Anxiety 05/10/2015  . Arthritis 05/10/2015  . Esophageal tissue web 05/10/2015  . Genital herpes 05/10/2015  . Bloodgood disease 05/10/2015  . Blood glucose elevated 05/10/2015  . BP (high blood pressure) 05/10/2015  . Cannot sleep 05/10/2015  . Headache, migraine 05/10/2015  . Avitaminosis D 05/10/2015  . Rash and nonspecific skin eruption 04/04/2015  . Clinical depression 10/02/2014  . Hypercholesteremia 10/02/2014  . A-fib 04/18/2014  . Breast mass, left 02/27/2014  . Breast cancer, right breast 09/12/2013  . Lump or mass in breast   . Malignant neoplasm of central portion of female breast     Past Surgical History  Procedure Laterality Date  . Breast surgery Right 2005,2014    biopsy  . Breast surgery Right 2014    mastectomy  . Knee surgery Left 2005  . Abdominal hysterectomy  1999  . Thumb surg Right 2004  . Tonsillectomy      age of 65  . Colonoscopy   2002  . Mastectomy Right 2014    radiation chemo    Her family history includes Cancer in her mother and other; Heart disease in her father; Hypertension in her sister; Osteoporosis in her paternal aunt and paternal grandmother.    Previous Medications   ACETAMINOPHEN (TYLENOL) 500 MG TABLET    Take 500-1,000 mg by mouth every 4 (four) hours as needed for pain (q 4-6 prn).   ALPRAZOLAM (XANAX) 0.5 MG TABLET    Take 0.5 mg by mouth 2 (two) times daily. Take 1/2 to 1 tablet twice a day   AMLODIPINE (NORVASC) 5 MG TABLET    Take 2.5 mg by mouth daily.    ASCORBIC ACID (VITAMIN C) 1000 MG TABLET    Take 1,000 mg by mouth daily.   ASPIRIN 81 MG CHEWABLE TABLET    Chew 81 mg by mouth daily.   BISOPROLOL-HYDROCHLOROTHIAZIDE (ZIAC) 10-6.25 MG PER TABLET    Take 1 tablet by mouth daily.   CALCIUM CARB-CHOLECALCIFEROL (OYSTER SHELL CALCIUM + D PO)    Take by mouth.   CHOLECALCIFEROL (VITAMIN D-3 PO)    Take 2,000 Units by mouth daily.   DOCUSATE SODIUM (COLACE) 100 MG CAPSULE    Take 100 mg by mouth 2 (two) times daily.   ESCITALOPRAM OXALATE PO    Take by mouth.   HYDROCHLOROTHIAZIDE (MICROZIDE)  12.5 MG CAPSULE    Take 12.5 mg by mouth daily.   LETROZOLE (FEMARA) 2.5 MG TABLET    TAKE 1 TABLET EVERY DAY   NYSTATIN CREAM (MYCOSTATIN)       OMEPRAZOLE (PRILOSEC OTC) 20 MG TABLET    Take by mouth.   TRAZODONE (DESYREL) 50 MG TABLET    Take 50 mg by mouth at bedtime.   WARFARIN (COUMADIN) 2 MG TABLET    Take 2 mg by mouth daily.    Patient Care Team: Margarita Rana, MD as PCP - General (Family Medicine) Robert Bellow, MD as Consulting Physician (General Surgery) Margarita Rana, MD as Referring Physician (Family Medicine)     Objective:   Vitals: BP 124/88 mmHg  Pulse 84  Temp(Src) 98.4 F (36.9 C) (Oral)  Resp 16  Ht 5' 4.5" (1.638 m)  Wt 232 lb (105.235 kg)  BMI 39.22 kg/m2  Physical Exam  Activities of Daily Living In your present state of health, do you have any difficulty  performing the following activities: 07/26/2015  Hearing? N  Vision? Y  Difficulty concentrating or making decisions? N  Walking or climbing stairs? Y  Dressing or bathing? N  Doing errands, shopping? N    Fall Risk Assessment Fall Risk  07/26/2015  Falls in the past year? No     Depression Screen PHQ 2/9 Scores 07/26/2015  PHQ - 2 Score 4  PHQ- 9 Score 14    Cognitive Testing - 6-CIT  Correct? Score   What year is it? yes 0 0 or 4  What month is it? yes 0 0 or 3  Memorize:    Erin Romero,  41,  High [address]  [place],      What time is it? (within 1 hour) yes 0 0 or 3  Count backwards from 20 yes 0 0, 2, or 4  Name the months of the year yes 0 0, 2, or 4  Repeat name & address above yes 0 0, 2, 4, 6, 8, or 10       TOTAL SCORE  0/28   Interpretation:  Normal  Normal (0-7) Abnormal (8-28)       Assessment & Plan:     Annual Wellness Visit  Reviewed patient's Family Medical History Reviewed and updated list of patient's medical providers Assessment of cognitive impairment was done Assessed patient's functional ability Established a written schedule for health screening [name] Completed and Reviewed  Exercise Activities and Dietary recommendations Goals    None       There is no immunization history on file for this patient.  Health Maintenance  Topic Date Due  . HIV Screening  03/23/1965  . TETANUS/TDAP  03/23/1969  . COLONOSCOPY  03/23/2000  . ZOSTAVAX  03/23/2010  . PNA vac Low Risk Adult (1 of 2 - PCV13) 03/24/2015  . INFLUENZA VACCINE  07/23/2015  . MAMMOGRAM  08/31/2016  . DEXA SCAN  Completed      Discussed health benefits of physical activity, and encouraged her to engage in regular exercise appropriate for her age and condition.    ------------------------------------------------------------------------------------------------------------

## 2015-07-26 NOTE — Progress Notes (Signed)
Subjective:    Patient ID: Erin Romero, female    DOB: Sep 13, 1951, 64 y.o.   MRN: [id number]  Hematuria This is a new problem. The current episode started in the past 7 days (3 days ago). She describes the hematuria as gross hematuria. Her pain is at a severity of 6/10. The pain is moderate. She describes her urine color as dark red. Irritative symptoms include frequency and urgency. Irritative symptoms do not include nocturia. Obstructive symptoms include incomplete emptying. Obstructive symptoms do not include dribbling, an intermittent stream, a slower stream, straining or a weak stream. Associated symptoms include abdominal pain and flank pain. Pertinent negatives include no chills, dysuria, facial swelling, fever, genital pain, hesitancy, inability to urinate, nausea or vomiting.  Pt currently is taking Coumadin for A-Fib. She is taking 2 mg qhs. Pt's last PT/INR was checked on 07/17/2015 by Dr. Ubaldo Glassing at [facility]. PT was 30.7; INR was 2.8.  Depression Pt is tearful in office when discussing husband who recently passed away. Pt admits she is experiencing fatigue and insomnia. Pt scored a 14 on her PHQ9 today.  Review of Systems  Constitutional: Negative for fever and chills.  HENT: Negative for facial swelling.   Gastrointestinal: Positive for abdominal pain. Negative for nausea and vomiting.  Genitourinary: Positive for urgency, frequency, hematuria, flank pain and incomplete emptying. Negative for dysuria, hesitancy and nocturia.   BP 124/88 mmHg  Pulse 84  Temp(Src) 98.4 F (36.9 C) (Oral)  Resp 16  Ht 5' 4.5" (1.638 m)  Wt 232 lb (105.235 kg)  BMI 39.22 kg/m2   Patient Active Problem List   Diagnosis Date Noted  . Adaptation reaction 05/10/2015  . Anxiety 05/10/2015  . Arthritis 05/10/2015  . Esophageal tissue web 05/10/2015  . Genital herpes 05/10/2015  . Bloodgood disease 05/10/2015  . Blood glucose elevated 05/10/2015  . BP (high blood pressure) 05/10/2015  . Cannot  sleep 05/10/2015  . Headache, migraine 05/10/2015  . Avitaminosis D 05/10/2015  . Rash and nonspecific skin eruption 04/04/2015  . Clinical depression 10/02/2014  . Hypercholesteremia 10/02/2014  . A-fib 04/18/2014  . Breast mass, left 02/27/2014  . Breast cancer, right breast 09/12/2013  . Lump or mass in breast   . Malignant neoplasm of central portion of female breast    Past Medical History  Diagnosis Date  . Hypertension 2005  . Atrial fibrillation 2014  . Lump or mass in breast 2014    right mastectomy  . Fibrocystic breast disease 2005  . Malignant neoplasm of central portion of female breast January 24, 2013    stage II,T2, N1a. Mastectomy with sentinel node biopsy. 2 sentinel nodes negative, non-sentinel node w/ 5 mm macrometastatic foci in non-sentinel node.  Received post mastectomy radiation.   Marland Kitchen Anxiety   . GERD (gastroesophageal reflux disease)   . Cancer     Breast    Current Outpatient Prescriptions on File Prior to Visit  Medication Sig  . acetaminophen (TYLENOL) 500 MG tablet Take 500-1,000 mg by mouth every 4 (four) hours as needed for pain (q 4-6 prn).  . ALPRAZolam (XANAX) 0.5 MG tablet Take 0.5 mg by mouth 2 (two) times daily. Take 1/2 to 1 tablet twice a day  . amLODipine (NORVASC) 5 MG tablet Take 2.5 mg by mouth daily.   . Ascorbic Acid (VITAMIN C) 1000 MG tablet Take 1,000 mg by mouth daily.  Marland Kitchen aspirin 81 MG chewable tablet Chew 81 mg by mouth daily.  . bisoprolol-hydrochlorothiazide [facility]) 10-6.25  MG per tablet Take 1 tablet by mouth daily.  . Calcium Carb-Cholecalciferol (OYSTER SHELL CALCIUM + D PO) Take by mouth.  . Cholecalciferol (VITAMIN D-3 PO) Take 2,000 Units by mouth daily.  Marland Kitchen docusate sodium (COLACE) 100 MG capsule Take 100 mg by mouth 2 (two) times daily.  . hydrochlorothiazide (MICROZIDE) 12.5 MG capsule Take 12.5 mg by mouth daily.  Marland Kitchen letrozole (FEMARA) 2.5 MG tablet TAKE 1 TABLET EVERY DAY  . omeprazole (PRILOSEC OTC) 20 MG tablet Take  by mouth.  . traZODone (DESYREL) 50 MG tablet Take 50 mg by mouth at bedtime.  Marland Kitchen warfarin (COUMADIN) 2 MG tablet Take 2 mg by mouth daily.  Marland Kitchen nystatin cream (MYCOSTATIN)    No current facility-administered medications on file prior to visit.   Allergies  Allergen Reactions  . Taxotere [Docetaxel] Itching and Rash  . Sulfa Antibiotics Hives   Past Surgical History  Procedure Laterality Date  . Breast surgery Right 2005,2014    biopsy  . Breast surgery Right 2014    mastectomy  . Knee surgery Left 2005  . Abdominal hysterectomy  1999  . Thumb surg Right 2004  . Tonsillectomy      age of 78  . Colonoscopy  2002  . Mastectomy Right 2014    radiation chemo   History   Social History  . Marital Status: Widowed    Spouse Name: Kalyn Hofstra  . Number of Children: 0  . Years of Education: 13   Occupational History  .  Unemployed    disabilty from breast cancer   Social History Main Topics  . Smoking status: Never Smoker   . Smokeless tobacco: Never Used  . Alcohol Use: No  . Drug Use: No  . Sexual Activity: Not on file   Other Topics Concern  . Not on file   Social History Narrative   Family History  Problem Relation Age of Onset  . Cancer Other     ovarian,liver,colon cancer-no family member listed  . Heart disease Father   . Osteoporosis Paternal Aunt   . Osteoporosis Paternal Grandmother   . Hypertension Sister   . Cancer Mother     cervical       Objective:   Physical Exam  Constitutional: She is oriented to person, place, and time. She appears well-developed and well-nourished.  Abdominal: Soft. Bowel sounds are normal. She exhibits no distension. There is no tenderness.  Musculoskeletal: She exhibits no tenderness (in low back ).  Neurological: She is alert and oriented to person, place, and time.   BP 124/88 mmHg  Pulse 84  Temp(Src) 98.4 F (36.9 C) (Oral)  Resp 16  Ht 5' 4.5" (1.638 m)  Wt 232 lb (105.235 kg)  BMI 39.22 kg/m2       Assessment & Plan:  1. Hematuria, gross INR elevated. Will hold Coumadin for now.  Will treat for UTI and send for culture.  - POCT INR - POCT urinalysis dipstick - Urine Culture   2. Atrial fibrillation, unspecified Will hold coumadin for now.  - POCT INR  3. Cystitis Suspect cause of hematuria.  - Urine Culture  Margarita Rana, MD

## 2015-07-27 ENCOUNTER — Telehealth: Payer: Self-pay

## 2015-07-27 LAB — URINE CULTURE: Organism ID, Bacteria: NO GROWTH

## 2015-07-27 NOTE — Telephone Encounter (Signed)
-----   Message from Margarita Rana, MD sent at 07/27/2015  5:12 PM EDT ----- No infection. Please see how patient is doing. Thanks.

## 2015-07-27 NOTE — Telephone Encounter (Signed)
Left message to call back  

## 2015-07-30 NOTE — Telephone Encounter (Signed)
Pt called.  I tried to put her back to a nurse.  Please give patient a call back.  Her number is [phone]. Please leave a message if you dont get her.  Thanks, Con Memos

## 2015-07-30 NOTE — Telephone Encounter (Signed)
Pt advised.Marland KitchenMarland KitchenMarland KitchenShe was wondering when she is supposed to go back on her Coumadin.  Thanks,   -Mickel Baas

## 2015-07-30 NOTE — Telephone Encounter (Signed)
Thought she was going to call her cardiologist about managing her coumadin. Is her bleeding better. Thanks.

## 2015-07-31 ENCOUNTER — Other Ambulatory Visit: Payer: Self-pay

## 2015-07-31 DIAGNOSIS — Z1231 Encounter for screening mammogram for malignant neoplasm of breast: Secondary | ICD-10-CM

## 2015-07-31 NOTE — Telephone Encounter (Signed)
She did report the bleeding has stopped.   Thanks,   -Mickel Baas

## 2015-07-31 NOTE — Telephone Encounter (Signed)
Pt advised; she is going to call her cardiologist.   Thanks,   Mickel Baas

## 2015-08-14 DIAGNOSIS — I480 Paroxysmal atrial fibrillation: Secondary | ICD-10-CM | POA: Diagnosis not present

## 2015-08-22 ENCOUNTER — Encounter: Payer: Self-pay | Admitting: Family Medicine

## 2015-08-22 ENCOUNTER — Ambulatory Visit (INDEPENDENT_AMBULATORY_CARE_PROVIDER_SITE_OTHER): Payer: Commercial Managed Care - HMO | Admitting: Family Medicine

## 2015-08-22 VITALS — BP 130/80 | HR 60 | Temp 98.8°F | Resp 16 | Ht 64.0 in | Wt 232.0 lb

## 2015-08-22 DIAGNOSIS — Z0000 Encounter for general adult medical examination without abnormal findings: Secondary | ICD-10-CM | POA: Diagnosis not present

## 2015-08-22 DIAGNOSIS — Z23 Encounter for immunization: Secondary | ICD-10-CM | POA: Diagnosis not present

## 2015-08-22 NOTE — Progress Notes (Signed)
Patient ID: Erin Romero, female   DOB: 08-May-1951, 64 y.o.   MRN: [id number]        Patient: Erin Romero, Female    DOB: [birth year], 64 y.o.   MRN: [id number] Visit Date: 08/22/2015  Today's Provider: Margarita Rana, MD   Chief Complaint  Patient presents with  . Medicare Wellness   Subjective:    Annual wellness visit Erin Romero [name] is a 64 y.o. female. She feels well. She reports not exercising. She reports she is sleeping fairly well.  Still misses her husband.    Pap-hysterectomy  08/31/14 Mammogram-BI-RADS 2 06/05/15 BMD  Lab Results  Component Value Date   WBC 10.0 05/22/2015   HGB 13.5 05/22/2015   HCT 40.1 05/22/2015   PLT 207 05/22/2015   GLUCOSE 116* 12/20/2014   ALT 17 05/22/2015   AST 23 05/22/2015   NA 142 12/20/2014   K 3.5 12/20/2014   CL 101 12/20/2014   CREATININE 0.86 12/20/2014   BUN 13 12/20/2014   CO2 32 12/20/2014   TSH 2.42 11/10/2014   INR 40.8 07/26/2015   HGBA1C 4.9 05/22/2014        Review of Systems  Constitutional: Positive for fatigue.  HENT: Positive for tinnitus.   Eyes: Negative.   Respiratory: Negative.   Cardiovascular: Negative.   Gastrointestinal: Negative.   Endocrine: Negative.   Genitourinary: Negative.   Musculoskeletal: Positive for back pain.  Skin: Negative.   Allergic/Immunologic: Negative.   Neurological: Negative.   Hematological: Bruises/bleeds easily.  Psychiatric/Behavioral: Negative.     Social History   Social History  . Marital Status: Widowed    Spouse Name: Erin Romero  . Number of Children: 0  . Years of Education: 13   Occupational History  .  Unemployed    disabilty from breast cancer   Social History Main Topics  . Smoking status: Never Smoker   . Smokeless tobacco: Never Used  . Alcohol Use: No  . Drug Use: No  . Sexual Activity: Not on file   Other Topics Concern  . Not on file   Social History Narrative    Patient Active Problem List   Diagnosis Date  Noted  . Cystitis 07/26/2015  . Adaptation reaction 05/10/2015  . Anxiety 05/10/2015  . Arthritis 05/10/2015  . Esophageal tissue web 05/10/2015  . Genital herpes 05/10/2015  . Bloodgood disease 05/10/2015  . Blood glucose elevated 05/10/2015  . BP (high blood pressure) 05/10/2015  . Cannot sleep 05/10/2015  . Headache, migraine 05/10/2015  . Avitaminosis D 05/10/2015  . Rash and nonspecific skin eruption 04/04/2015  . Clinical depression 10/02/2014  . Hypercholesteremia 10/02/2014  . A-fib 04/18/2014  . Breast mass, left 02/27/2014  . Breast cancer, right breast 09/12/2013  . Lump or mass in breast   . Malignant neoplasm of central portion of female breast     Past Surgical History  Procedure Laterality Date  . Breast surgery Right 2005,2014    biopsy  . Breast surgery Right 2014    mastectomy  . Knee surgery Left 2005  . Abdominal hysterectomy  1999  . Thumb surg Right 2004  . Tonsillectomy      age of 61  . Colonoscopy  2002  . Mastectomy Right 2014    radiation chemo    Her family history includes Cancer in her mother and other; Heart disease in her father; Hypertension in her sister; Osteoporosis in her paternal aunt and paternal grandmother.    Previous Medications  ACETAMINOPHEN (TYLENOL) 500 MG TABLET    Take 500-1,000 mg by mouth every 4 (four) hours as needed for pain (q 4-6 prn).   ALPRAZOLAM (XANAX) 0.5 MG TABLET    Take 0.5 mg by mouth 2 (two) times daily. Take 1/2 to 1 tablet twice a day   AMLODIPINE (NORVASC) 5 MG TABLET    Take 2.5 mg by mouth daily.    ASCORBIC ACID (VITAMIN C) 1000 MG TABLET    Take 1,000 mg by mouth daily.   ASPIRIN 81 MG CHEWABLE TABLET    Chew 81 mg by mouth daily.   BISOPROLOL-HYDROCHLOROTHIAZIDE (ZIAC) 10-6.25 MG PER TABLET    Take 1 tablet by mouth daily.   CALCIUM CARB-CHOLECALCIFEROL (OYSTER SHELL CALCIUM + D PO)    Take by mouth.   CHOLECALCIFEROL (VITAMIN D-3 PO)    Take 2,000 Units by mouth daily.   DOCUSATE SODIUM  (COLACE) 100 MG CAPSULE    Take 100 mg by mouth 2 (two) times daily.   ESCITALOPRAM OXALATE PO    Take 10 mg by mouth.   HYDROCHLOROTHIAZIDE (MICROZIDE) 12.5 MG CAPSULE    Take 12.5 mg by mouth daily.   LETROZOLE (FEMARA) 2.5 MG TABLET    TAKE 1 TABLET EVERY DAY   OMEPRAZOLE (PRILOSEC OTC) 20 MG TABLET    Take by mouth.   TRAZODONE (DESYREL) 50 MG TABLET    Take 50 mg by mouth at bedtime.   WARFARIN (COUMADIN) 2 MG TABLET    Take 2 mg by mouth daily.    Patient Care Team: Margarita Rana, MD as PCP - General (Family Medicine) Robert Bellow, MD as Consulting Physician (General Surgery) Margarita Rana, MD as Referring Physician (Family Medicine)     Objective:   Vitals: BP 130/80 mmHg  Pulse 60  Temp(Src) 98.8 F (37.1 C) (Oral)  Resp 16  Ht 5\' 4"  (1.626 m)  Wt 232 lb (105.235 kg)  BMI 39.80 kg/m2  Physical Exam  Constitutional: She is oriented to person, place, and time. She appears well-developed and well-nourished.  HENT:  Head: Normocephalic and atraumatic.  Right Ear: Tympanic membrane, external ear and ear canal normal.  Left Ear: Tympanic membrane, external ear and ear canal normal.  Nose: Nose normal.  Mouth/Throat: Uvula is midline, oropharynx is clear and moist and mucous membranes are normal.  Eyes: Conjunctivae, EOM and lids are normal. Pupils are equal, round, and reactive to light.  Neck: Trachea normal and normal range of motion. Neck supple. Carotid bruit is not present. No thyroid mass and no thyromegaly present.  Cardiovascular: Normal rate, regular rhythm and normal heart sounds.   Pulmonary/Chest: Effort normal and breath sounds normal.  Well healed surgical scar on right  Right breast mastectomy Palpable port  Abdominal: Soft. Normal appearance and bowel sounds are normal. There is no hepatosplenomegaly. There is no tenderness.  Genitourinary: No breast swelling, tenderness or discharge.  Musculoskeletal: Normal range of motion.  Lymphadenopathy:     She has no cervical adenopathy.    She has no axillary adenopathy.  Neurological: She is alert and oriented to person, place, and time. She has normal strength. No cranial nerve deficit.  Skin: Skin is warm, dry and intact.  Psychiatric: She has a normal mood and affect. Her speech is normal and behavior is normal. Judgment and thought content normal. Cognition and memory are normal.    Activities of Daily Living In your present state of health, do you have any difficulty performing the following activities: 08/22/2015  07/26/2015  Hearing? N N  Vision? N Y  Difficulty concentrating or making decisions? N N  Walking or climbing stairs? Y Y  Dressing or bathing? N N  Doing errands, shopping? N N    Fall Risk Assessment Fall Risk  08/22/2015 07/26/2015  Falls in the past year? No No     Depression Screen PHQ 2/9 Scores 08/22/2015 07/26/2015  PHQ - 2 Score 0 4  PHQ- 9 Score - 14    Cognitive Testing - 6-CIT  Correct? Score   What year is it? yes 0 0 or 4  What month is it? yes 0 0 or 3  Memorize:    Pia Mau,  41,  [address]  [place],      What time is it? (within 1 hour) yes 0 0 or 3  Count backwards from 20 yes 1 0, 2, or 4  Name the months of the year yes 0 0, 2, or 4  Repeat name & address above yes 0 0, 2, 4, 6, 8, or 10       TOTAL SCORE  0/28   Interpretation:  Normal  Normal (0-7) Abnormal (8-28)       Assessment & Plan:     Annual Wellness Visit  Reviewed patient's Family Medical History Reviewed and updated list of patient's medical providers Assessment of cognitive impairment was done Assessed patient's functional ability Established a written schedule for health screening [place] Completed and Reviewed  Exercise Activities and Dietary recommendations Goals    . Exercise 150 minutes per week (moderate activity)       Immunization History  Administered Date(s) Administered  . Pneumococcal Conjugate-13 08/22/2015    Health  Maintenance  Topic Date Due  . Hepatitis C Screening  1951-01-20  . HIV Screening  03/23/1965  . TETANUS/TDAP  03/23/1969  . COLONOSCOPY  03/23/2000  . ZOSTAVAX  03/23/2010  . INFLUENZA VACCINE  07/23/2015  . PNA vac Low Risk Adult (2 of 2 - PPSV23) 08/21/2016  . MAMMOGRAM  08/31/2016  . DEXA SCAN  Completed       1. Medicare annual wellness visit, initial Stable. Patient advised to continue eating healthy and exercise daily. - EKG 12-Lead  2. Need for pneumococcal vaccination - Pneumococcal conjugate vaccine 13-valent IM   Patient seen and examined by Dr. Jerrell Belfast, and note scribed by Philbert Riser. Dimas, CMA. I have reviewed the document for accuracy and completeness and I agree with above. Jerrell Belfast, MD       ------------------------------------------------------------------------------------------------------------

## 2015-08-22 NOTE — Patient Instructions (Signed)
Patient advised to work on healthy lifestyle.

## 2015-09-04 ENCOUNTER — Other Ambulatory Visit: Payer: Self-pay | Admitting: Hematology and Oncology

## 2015-09-11 ENCOUNTER — Inpatient Hospital Stay: Payer: Commercial Managed Care - HMO | Attending: Hematology and Oncology

## 2015-09-11 DIAGNOSIS — I480 Paroxysmal atrial fibrillation: Secondary | ICD-10-CM | POA: Diagnosis not present

## 2015-09-11 DIAGNOSIS — Z452 Encounter for adjustment and management of vascular access device: Secondary | ICD-10-CM | POA: Insufficient documentation

## 2015-09-11 DIAGNOSIS — C50911 Malignant neoplasm of unspecified site of right female breast: Secondary | ICD-10-CM | POA: Insufficient documentation

## 2015-09-11 DIAGNOSIS — C801 Malignant (primary) neoplasm, unspecified: Secondary | ICD-10-CM

## 2015-09-11 MED ORDER — HEPARIN SOD (PORK) LOCK FLUSH 100 UNIT/ML IV SOLN
500.0000 [IU] | Freq: Once | INTRAVENOUS | Status: AC
  Administered 2015-09-11: 500 [IU] via INTRAVENOUS

## 2015-09-11 MED ORDER — SODIUM CHLORIDE 0.9 % IJ SOLN
10.0000 mL | Freq: Once | INTRAMUSCULAR | Status: AC
  Administered 2015-09-11: 10 mL via INTRAVENOUS
  Filled 2015-09-11: qty 10

## 2015-09-12 ENCOUNTER — Ambulatory Visit
Admission: RE | Admit: 2015-09-12 | Discharge: 2015-09-12 | Disposition: A | Discharge status: Home / Self Care | Payer: Commercial Managed Care - HMO | Source: Ambulatory Visit | Attending: General Surgery | Admitting: General Surgery

## 2015-09-12 DIAGNOSIS — Z1231 Encounter for screening mammogram for malignant neoplasm of breast: Secondary | ICD-10-CM | POA: Insufficient documentation

## 2015-09-12 DIAGNOSIS — Z9011 Acquired absence of right breast and nipple: Secondary | ICD-10-CM | POA: Insufficient documentation

## 2015-09-19 ENCOUNTER — Ambulatory Visit (INDEPENDENT_AMBULATORY_CARE_PROVIDER_SITE_OTHER): Payer: Commercial Managed Care - HMO | Admitting: General Surgery

## 2015-09-19 VITALS — BP 130/70 | HR 74 | Resp 14 | Ht 64.0 in | Wt 231.0 lb

## 2015-09-19 DIAGNOSIS — Z1211 Encounter for screening for malignant neoplasm of colon: Secondary | ICD-10-CM | POA: Diagnosis not present

## 2015-09-19 DIAGNOSIS — C50911 Malignant neoplasm of unspecified site of right female breast: Secondary | ICD-10-CM | POA: Diagnosis not present

## 2015-09-19 MED ORDER — POLYETHYLENE GLYCOL 3350 17 GM/SCOOP PO POWD
ORAL | Status: DC
Start: 2015-09-19 — End: 2015-10-11

## 2015-09-19 NOTE — Patient Instructions (Addendum)
Patient will be asked to return to the office in one year with a left screening mammogram.Colonoscopy A colonoscopy is an exam to look at the entire large intestine (colon). This exam can help find problems such as tumors, polyps, inflammation, and areas of bleeding. The exam takes about 1 hour.  LET [facility] CARE PROVIDER KNOW ABOUT:   Any allergies you have.  All medicines you are taking, including vitamins, herbs, eye drops, creams, and over-the-counter medicines.  Previous problems you or members of your family have had with the use of anesthetics.  Any blood disorders you have.  Previous surgeries you have had.  Medical conditions you have. RISKS AND COMPLICATIONS  Generally, this is a safe procedure. However, as with any procedure, complications can occur. Possible complications include:  Bleeding.  Tearing or rupture of the colon wall.  Reaction to medicines given during the exam.  Infection (rare). BEFORE THE PROCEDURE   Ask your health care provider about changing or stopping your regular medicines.  You may be prescribed an oral bowel prep. This involves drinking a large amount of medicated liquid, starting the day before your procedure. The liquid will cause you to have multiple loose stools until your stool is almost clear or light green. This cleans out your colon in preparation for the procedure.  Do not eat or drink anything else once you have started the bowel prep, unless your health care provider tells you it is safe to do so.  Arrange for someone to drive you home after the procedure. PROCEDURE   You will be given medicine to help you relax (sedative).  You will lie on your side with your knees bent.  A long, flexible tube with a light and camera on the end (colonoscope) will be inserted through the rectum and into the colon. The camera sends video back to a computer screen as it moves through the colon. The colonoscope also releases carbon dioxide gas  to inflate the colon. This helps your health care provider see the area better.  During the exam, your health care provider may take a small tissue sample (biopsy) to be examined under a microscope if any abnormalities are found.  The exam is finished when the entire colon has been viewed. AFTER THE PROCEDURE   Do not drive for 24 hours after the exam.  You may have a small amount of blood in your stool.  You may pass moderate amounts of gas and have mild abdominal cramping or bloating. This is caused by the gas used to inflate your colon during the exam.  Ask when your test results will be ready and how you will get your results. Make sure you get your test results. Document Released: 12/05/2000 Document Revised: 09/28/2013 Document Reviewed: 08/15/2013 [facility] Patient Information 2015 [place], [place]. This information is not intended to replace advice given to you by your health care provider. Make sure you discuss any questions you have with your health care provider.  Patient has been scheduled for a colonoscopy on 10-16-15 at [facility]. This patient has been asked to stop coumadin 5 days prior to procedure. It is okay for patient to continue 81 mg aspirin.

## 2015-09-19 NOTE — Progress Notes (Signed)
Patient ID: Erin Romero, female   DOB: [birth year], 64 y.o.   MRN: [id number]  Chief Complaint  Patient presents with  . Follow-up    left breast  mammogram    HPI [facility] is a 64 y.o. female. who presents for a breast evaluation. The most recent left breast  mammogram was done on 09/12/15. Patient does perform regular self breast checks and gets regular mammograms done.   The patient's care is being transferred to Dr. Mike Gip who she will meet in the next month or so.  HPI  Past Medical History  Diagnosis Date  . Hypertension 2005  . Atrial fibrillation 2014  . Lump or mass in breast 2014    right mastectomy  . Fibrocystic breast disease 2005  . Malignant neoplasm of central portion of female breast January 24, 2013    stage II,T2, N1a. Mastectomy with sentinel node biopsy. 2 sentinel nodes negative, non-sentinel node w/ 5 mm macrometastatic foci in non-sentinel node.  Received post mastectomy radiation.   Marland Kitchen Anxiety   . GERD (gastroesophageal reflux disease)   . Cancer     Breast     Past Surgical History  Procedure Laterality Date  . Breast surgery Right 2005,2014    biopsy  . Breast surgery Right 2014    mastectomy  . Knee surgery Left 2005  . Abdominal hysterectomy  1999  . Thumb surg Right 2004  . Tonsillectomy      age of 83  . Colonoscopy  2002  . Breast biopsy Left 2015    Negative  . Mastectomy Right 2014    radiation chemo    Family History  Problem Relation Age of Onset  . Cancer Other     ovarian,liver,colon cancer-no family member listed  . Heart disease Father   . Osteoporosis Paternal Aunt   . Osteoporosis Paternal Grandmother   . Hypertension Sister   . Cancer Mother     cervical    Social History Social History  Substance Use Topics  . Smoking status: Never Smoker   . Smokeless tobacco: Never Used  . Alcohol Use: No    Allergies  Allergen Reactions  . Taxotere [Docetaxel] Itching and Rash  . Sulfa Antibiotics Hives     Current Outpatient Prescriptions  Medication Sig Dispense Refill  . acetaminophen (TYLENOL) 500 MG tablet Take 500-1,000 mg by mouth every 4 (four) hours as needed for pain (q 4-6 prn).    Marland Kitchen amLODipine (NORVASC) 5 MG tablet Take 2.5 mg by mouth daily.     . Ascorbic Acid (VITAMIN C) 1000 MG tablet Take 1,000 mg by mouth daily.    Marland Kitchen aspirin 81 MG chewable tablet Chew 81 mg by mouth daily.    . bisoprolol-hydrochlorothiazide (ZIAC) 10-6.25 MG per tablet Take 1 tablet by mouth daily.    . Calcium Carb-Cholecalciferol (OYSTER SHELL CALCIUM + D PO) Take by mouth.    . Cholecalciferol (VITAMIN D-3 PO) Take 2,000 Units by mouth daily.    Marland Kitchen docusate sodium (COLACE) 100 MG capsule Take 100 mg by mouth 2 (two) times daily.    Marland Kitchen ESCITALOPRAM OXALATE PO Take 10 mg by mouth.    . hydrochlorothiazide (MICROZIDE) 12.5 MG capsule Take 12.5 mg by mouth daily.    Marland Kitchen letrozole (FEMARA) 2.5 MG tablet TAKE 1 TABLET EVERY DAY 90 tablet 0  . omeprazole (PRILOSEC OTC) 20 MG tablet Take 40 mg by mouth daily.     . traZODone (DESYREL) 50 MG tablet Take  50 mg by mouth at bedtime.    Marland Kitchen warfarin (COUMADIN) 2 MG tablet Take 2 mg by mouth daily.    . polyethylene glycol powder (GLYCOLAX/MIRALAX) powder 255 grams one bottle for colonoscopy prep 255 g 0   No current facility-administered medications for this visit.    Review of Systems Review of Systems  Constitutional: Negative.   Respiratory: Negative.   Cardiovascular: Negative.     Blood pressure 130/70, pulse 74, resp. rate 14, height 5\' 4"  (1.626 m), weight 231 lb (104.781 kg).  Physical Exam Physical Exam  Constitutional: She is oriented to person, place, and time. She appears well-developed and well-nourished.  Eyes: Conjunctivae are normal. No scleral icterus.  Neck: Neck supple.  Cardiovascular: Normal rate and normal heart sounds.  An irregular rhythm present.  Pulmonary/Chest: Effort normal and breath sounds normal. Left breast exhibits no  inverted nipple, no mass, no nipple discharge, no skin change and no tenderness.  Right mastectomy is clean and well healed.  Lymphadenopathy:    She has no cervical adenopathy.    She has no axillary adenopathy.  Neurological: She is alert and oriented to person, place, and time.  Skin: Skin is warm.  Vitals reviewed.  Send note to see if port may come out.  Data Reviewed Left breast screening mammogram dated 09/12/2015 was reviewed. No interval change. BI-RADS-1.  Assessment    Doing well status post right mastectomy.  Candidate for screening colonoscopy.    Plan    The patient will discuss with Dr. Mike Gip at her upcoming visit if the port can be removed.  Colonoscopy with possible biopsy/polypectomy prn: Information regarding the procedure, including its potential risks and complications (including but not limited to perforation of the bowel, which may require emergency surgery to repair, and bleeding) was verbally given to the patient. Educational information regarding lower intestinal endoscopy was given to the patient. Written instructions for how to complete the bowel prep using Miralax were provided. The importance of drinking ample fluids to avoid dehydration as a result of the prep emphasized.    Patient will be asked to return to the office in one year with a left screening mammogram.  Patient has been scheduled for a colonoscopy on 10-16-15 at [facility]. This patient has been asked to stop coumadin 5 days prior to procedure. It is okay for patient to continue 81 mg aspirin.   PCP:  Wynn Banker 09/20/2015, 9:16 AM

## 2015-09-20 ENCOUNTER — Telehealth: Payer: Self-pay | Admitting: *Deleted

## 2015-09-20 NOTE — Telephone Encounter (Signed)
Patient is currently scheduled for a colonoscopy on 10/16/15 with Dr.Byrnett and she said that she needs to have the colonoscopy in the morning because she will get a headache if she doesn't eat.

## 2015-09-20 NOTE — Telephone Encounter (Signed)
Patient was contacted and has decided to keep colonoscopy for the same day but we will have Endoscopy schedule as the first case the afternoon of 10-16-15 due to transportation issues.

## 2015-09-20 NOTE — H&P (Signed)
HPI  Erin Romero is a 64 y.o. female. who presents for a breast evaluation. The most recent left breast mammogram was done on 09/12/15.  Patient does perform regular self breast checks and gets regular mammograms done.  The patient's care is being transferred to Dr. Mike Gip who she will meet in the next month or so.  HPI  Past Medical History   Diagnosis  Date   .  Hypertension  2005   .  Atrial fibrillation  2014   .  Lump or mass in breast  2014     right mastectomy   .  Fibrocystic breast disease  2005   .  Malignant neoplasm of central portion of female breast  January 24, 2013     stage II,T2, N1a. Mastectomy with sentinel node biopsy. 2 sentinel nodes negative, non-sentinel node w/ 5 mm macrometastatic foci in non-sentinel node. Received post mastectomy radiation.   Marland Kitchen  Anxiety    .  GERD (gastroesophageal reflux disease)    .  Cancer      Breast    Past Surgical History   Procedure  Laterality  Date   .  Breast surgery  Right  2005,2014     biopsy   .  Breast surgery  Right  2014     mastectomy   .  Knee surgery  Left  2005   .  Abdominal hysterectomy   1999   .  Thumb surg  Right  2004   .  Tonsillectomy       age of 63   .  Colonoscopy   2002   .  Breast biopsy  Left  2015     Negative   .  Mastectomy  Right  2014     radiation chemo    Family History   Problem  Relation  Age of Onset   .  Cancer  Other      ovarian,liver,colon cancer-no family member listed   .  Heart disease  Father    .  Osteoporosis  Paternal Aunt    .  Osteoporosis  Paternal Grandmother    .  Hypertension  Sister    .  Cancer  Mother      cervical    Social History  Social History   Substance Use Topics   .  Smoking status:  Never Smoker   .  Smokeless tobacco:  Never Used   .  Alcohol Use:  No    Allergies   Allergen  Reactions   .  Taxotere [Docetaxel]  Itching and Rash   .  Sulfa Antibiotics  Hives    Current Outpatient Prescriptions   Medication  Sig  Dispense   Refill   .  acetaminophen (TYLENOL) 500 MG tablet  Take 500-1,000 mg by mouth every 4 (four) hours as needed for pain (q 4-6 prn).     Marland Kitchen  amLODipine (NORVASC) 5 MG tablet  Take 2.5 mg by mouth daily.     .  Ascorbic Acid (VITAMIN C) 1000 MG tablet  Take 1,000 mg by mouth daily.     Marland Kitchen  aspirin 81 MG chewable tablet  Chew 81 mg by mouth daily.     .  bisoprolol-hydrochlorothiazide (ZIAC) 10-6.25 MG per tablet  Take 1 tablet by mouth daily.     .  Calcium Carb-Cholecalciferol (OYSTER SHELL CALCIUM + D PO)  Take by mouth.     .  Cholecalciferol (VITAMIN D-3 PO)  Take 2,000  Units by mouth daily.     Marland Kitchen  docusate sodium (COLACE) 100 MG capsule  Take 100 mg by mouth 2 (two) times daily.     Marland Kitchen  ESCITALOPRAM OXALATE PO  Take 10 mg by mouth.     .  hydrochlorothiazide (MICROZIDE) 12.5 MG capsule  Take 12.5 mg by mouth daily.     Marland Kitchen  letrozole (FEMARA) 2.5 MG tablet  TAKE 1 TABLET EVERY DAY  90 tablet  0   .  omeprazole (PRILOSEC OTC) 20 MG tablet  Take 40 mg by mouth daily.     .  traZODone (DESYREL) 50 MG tablet  Take 50 mg by mouth at bedtime.     Marland Kitchen  warfarin (COUMADIN) 2 MG tablet  Take 2 mg by mouth daily.     .  polyethylene glycol powder (GLYCOLAX/MIRALAX) powder  255 grams one bottle for colonoscopy prep  255 g  0    No current facility-administered medications for this visit.    Review of Systems  Review of Systems  Constitutional: Negative.  Respiratory: Negative.  Cardiovascular: Negative.   Blood pressure 130/70, pulse 74, resp. rate 14, height 5\' 4"  (1.626 m), weight 231 lb (104.781 kg).  Physical Exam  Physical Exam  Constitutional: She is oriented to person, place, and time. She appears well-developed and well-nourished.  Eyes: Conjunctivae are normal. No scleral icterus.  Neck: Neck supple.  Cardiovascular: Normal rate and normal heart sounds. An irregular rhythm present.  Pulmonary/Chest: Effort normal and breath sounds normal. Left breast exhibits no inverted nipple, no mass, no  nipple discharge, no skin change and no tenderness.  Right mastectomy is clean and well healed.  Lymphadenopathy:  She has no cervical adenopathy.  She has no axillary adenopathy.  Neurological: She is alert and oriented to person, place, and time.  Skin: Skin is warm.  Vitals reviewed.   Send note to see if port may come out.  Data Reviewed  Left breast screening mammogram dated 09/12/2015 was reviewed. No interval change. BI-RADS-1.  Assessment   Doing well status post right mastectomy.  Candidate for screening colonoscopy.   Plan   The patient will discuss with Dr. Mike Gip at her upcoming visit if the port can be removed.  Colonoscopy with possible biopsy/polypectomy prn: Information regarding the procedure, including its potential risks and complications (including but not limited to perforation of the bowel, which may require emergency surgery to repair, and bleeding) was verbally given to the patient. Educational information regarding lower intestinal endoscopy was given to the patient. Written instructions for how to complete the bowel prep using Miralax were provided. The importance of drinking ample fluids to avoid dehydration as a result of the prep emphasized.  Patient will be asked to return to the office in one year with a left screening mammogram.  Patient has been scheduled for a colonoscopy on 10-16-15 at [facility]. This patient has been asked to stop coumadin 5 days prior to procedure. It is okay for patient to continue 81 mg aspirin.  PCP: Wynn Banker  09/20/2015, 9:16 AM

## 2015-09-25 ENCOUNTER — Ambulatory Visit: Payer: Commercial Managed Care - HMO | Admitting: General Surgery

## 2015-10-09 DIAGNOSIS — I10 Essential (primary) hypertension: Secondary | ICD-10-CM | POA: Diagnosis not present

## 2015-10-09 DIAGNOSIS — I480 Paroxysmal atrial fibrillation: Secondary | ICD-10-CM | POA: Diagnosis not present

## 2015-10-09 DIAGNOSIS — E7800 Pure hypercholesterolemia, unspecified: Secondary | ICD-10-CM | POA: Diagnosis not present

## 2015-10-11 ENCOUNTER — Other Ambulatory Visit: Payer: Self-pay | Admitting: General Surgery

## 2015-10-16 ENCOUNTER — Ambulatory Visit: Payer: Commercial Managed Care - HMO | Admitting: Anesthesiology

## 2015-10-16 ENCOUNTER — Encounter
Admission: RE | Disposition: A | Discharge status: Home / Self Care | Payer: Self-pay | Source: Ambulatory Visit | Attending: General Surgery

## 2015-10-16 ENCOUNTER — Encounter: Payer: Self-pay | Admitting: Anesthesiology

## 2015-10-16 ENCOUNTER — Ambulatory Visit
Admission: RE | Admit: 2015-10-16 | Discharge: 2015-10-16 | Disposition: A | Discharge status: Home / Self Care | Payer: Commercial Managed Care - HMO | Source: Ambulatory Visit | Attending: General Surgery | Admitting: General Surgery

## 2015-10-16 DIAGNOSIS — Z7901 Long term (current) use of anticoagulants: Secondary | ICD-10-CM | POA: Diagnosis not present

## 2015-10-16 DIAGNOSIS — K219 Gastro-esophageal reflux disease without esophagitis: Secondary | ICD-10-CM | POA: Diagnosis not present

## 2015-10-16 DIAGNOSIS — Z7982 Long term (current) use of aspirin: Secondary | ICD-10-CM | POA: Insufficient documentation

## 2015-10-16 DIAGNOSIS — I4891 Unspecified atrial fibrillation: Secondary | ICD-10-CM | POA: Diagnosis not present

## 2015-10-16 DIAGNOSIS — Z882 Allergy status to sulfonamides status: Secondary | ICD-10-CM | POA: Diagnosis not present

## 2015-10-16 DIAGNOSIS — Z853 Personal history of malignant neoplasm of breast: Secondary | ICD-10-CM | POA: Insufficient documentation

## 2015-10-16 DIAGNOSIS — I10 Essential (primary) hypertension: Secondary | ICD-10-CM | POA: Diagnosis not present

## 2015-10-16 DIAGNOSIS — Z1211 Encounter for screening for malignant neoplasm of colon: Secondary | ICD-10-CM | POA: Insufficient documentation

## 2015-10-16 DIAGNOSIS — K5520 Angiodysplasia of colon without hemorrhage: Secondary | ICD-10-CM | POA: Diagnosis not present

## 2015-10-16 DIAGNOSIS — Z79899 Other long term (current) drug therapy: Secondary | ICD-10-CM | POA: Diagnosis not present

## 2015-10-16 DIAGNOSIS — N6019 Diffuse cystic mastopathy of unspecified breast: Secondary | ICD-10-CM | POA: Insufficient documentation

## 2015-10-16 DIAGNOSIS — F419 Anxiety disorder, unspecified: Secondary | ICD-10-CM | POA: Insufficient documentation

## 2015-10-16 DIAGNOSIS — Z9221 Personal history of antineoplastic chemotherapy: Secondary | ICD-10-CM | POA: Insufficient documentation

## 2015-10-16 DIAGNOSIS — Z90710 Acquired absence of both cervix and uterus: Secondary | ICD-10-CM | POA: Insufficient documentation

## 2015-10-16 DIAGNOSIS — Z888 Allergy status to other drugs, medicaments and biological substances status: Secondary | ICD-10-CM | POA: Insufficient documentation

## 2015-10-16 HISTORY — PX: COLONOSCOPY WITH PROPOFOL: SHX5780

## 2015-10-16 SURGERY — COLONOSCOPY WITH PROPOFOL
Anesthesia: General

## 2015-10-16 MED ORDER — SODIUM CHLORIDE 0.9 % IV SOLN
INTRAVENOUS | Status: DC
  Administered 2015-10-16: 1000 mL via INTRAVENOUS

## 2015-10-16 MED ORDER — PROPOFOL 500 MG/50ML IV EMUL
INTRAVENOUS | Status: DC | PRN
  Administered 2015-10-16: 100 ug/kg/min via INTRAVENOUS

## 2015-10-16 MED ORDER — FENTANYL CITRATE (PF) 100 MCG/2ML IJ SOLN
INTRAMUSCULAR | Status: DC | PRN
  Administered 2015-10-16: 50 ug via INTRAVENOUS

## 2015-10-16 MED ORDER — MIDAZOLAM HCL 2 MG/2ML IJ SOLN
INTRAMUSCULAR | Status: DC | PRN
  Administered 2015-10-16: 1 mg via INTRAVENOUS

## 2015-10-16 NOTE — H&P (Signed)
Erin Romero [id number] 1951/06/11     HPI: Screening colonoscopy.  Reports some vomiting last PM of prep. Clear BM this AM. Should be OK to proceed.   Prescriptions prior to admission  Medication Sig Dispense Refill Last Dose  . amLODipine (NORVASC) 5 MG tablet Take 2.5 mg by mouth daily.    10/16/2015 at 0600  . bisoprolol-hydrochlorothiazide (ZIAC) 10-6.25 MG per tablet Take 1 tablet by mouth daily.   10/16/2015 at 0600  . hydrochlorothiazide (MICROZIDE) 12.5 MG capsule Take 12.5 mg by mouth daily.   10/16/2015 at 0600  . acetaminophen (TYLENOL) 500 MG tablet Take 500-1,000 mg by mouth every 4 (four) hours as needed for pain (q 4-6 prn).   Taking  . Ascorbic Acid (VITAMIN C) 1000 MG tablet Take 1,000 mg by mouth daily.   Taking  . aspirin 81 MG chewable tablet Chew 81 mg by mouth daily.   Taking  . Calcium Carb-Cholecalciferol (OYSTER SHELL CALCIUM + D PO) Take by mouth.   Taking  . Cholecalciferol (VITAMIN D-3 PO) Take 2,000 Units by mouth daily.   Taking  . docusate sodium (COLACE) 100 MG capsule Take 100 mg by mouth 2 (two) times daily.   Taking  . ESCITALOPRAM OXALATE PO Take 10 mg by mouth.   Taking  . letrozole (FEMARA) 2.5 MG tablet TAKE 1 TABLET EVERY DAY 90 tablet 0 Taking  . omeprazole (PRILOSEC OTC) 20 MG tablet Take 40 mg by mouth daily.    Taking  . polyethylene glycol powder (GLYCOLAX/MIRALAX) powder USE AS DIRECTED  FOR  COLONOSCOPY  PREP 255 g 0   . traZODone (DESYREL) 50 MG tablet Take 50 mg by mouth at bedtime.   Taking  . warfarin (COUMADIN) 2 MG tablet Take 2 mg by mouth daily.   Taking   Allergies  Allergen Reactions  . Taxotere [Docetaxel] Itching and Rash  . Sulfa Antibiotics Hives   Past Medical History  Diagnosis Date  . Hypertension 2005  . Atrial fibrillation ([place]) 2014  . Lump or mass in breast 2014    right mastectomy  . Fibrocystic breast disease 2005  . Malignant neoplasm of central portion of female breast ([place]) January 24, 2013    stage  II,T2, N1a. Mastectomy with sentinel node biopsy. 2 sentinel nodes negative, non-sentinel node w/ 5 mm macrometastatic foci in non-sentinel node.  Received post mastectomy radiation.   Marland Kitchen Anxiety   . GERD (gastroesophageal reflux disease)   . Cancer [facility])     Breast    Past Surgical History  Procedure Laterality Date  . Breast surgery Right 2005,2014    biopsy  . Breast surgery Right 2014    mastectomy  . Knee surgery Left 2005  . Abdominal hysterectomy  1999  . Thumb surg Right 2004  . Tonsillectomy      age of 7  . Colonoscopy  2002  . Breast biopsy Left 2015    Negative  . Mastectomy Right 2014    radiation chemo   Social History   Social History  . Marital Status: Widowed    Spouse Name: Florence Yeung  . Number of Children: 0  . Years of Education: 13   Occupational History  .  Unemployed    disabilty from breast cancer   Social History Main Topics  . Smoking status: Never Smoker   . Smokeless tobacco: Never Used  . Alcohol Use: No  . Drug Use: No  . Sexual Activity: Not on file   Other  Topics Concern  . Not on file   Social History Narrative   Social History   Social History Narrative     ROS: Negative.     PE: HEENT: Negative. Lungs: Clear. Cardio: RR. Robert Bellow 10/16/2015   Assessment/Plan:  Proceed with planned endoscopy.

## 2015-10-16 NOTE — Brief Op Note (Signed)
Left PAC Accessed with 20-Gauge Huber using Sterile Technique.  Blood Return Verified with Cardell Peach, RN.

## 2015-10-16 NOTE — Anesthesia Preprocedure Evaluation (Signed)
Anesthesia Evaluation  Patient identified by MRN, date of birth, ID band Patient awake    Reviewed: Allergy & Precautions, H&P , NPO status , Patient's Chart, lab work & pertinent test results  Airway Mallampati: III  TM Distance: >3 FB Neck ROM: limited    Dental no notable dental hx. (+) Teeth Intact   Pulmonary neg pulmonary ROS, neg shortness of breath,    Pulmonary exam normal breath sounds clear to auscultation       Cardiovascular Exercise Tolerance: Good hypertension, (-) angina(-) Past MI and (-) DOE Normal cardiovascular exam Rhythm:regular Rate:Normal     Neuro/Psych  Headaches, PSYCHIATRIC DISORDERS Anxiety Depression    GI/Hepatic Neg liver ROS, GERD  Controlled,  Endo/Other  negative endocrine ROS  Renal/GU negative Renal ROS  negative genitourinary   Musculoskeletal   Abdominal   Peds  Hematology negative hematology ROS (+)   Anesthesia Other Findings Past Medical History:   Hypertension                                    2005         Atrial fibrillation ([place])                       2014         Lump or mass in breast                          2014           Comment:right mastectomy   Fibrocystic breast disease                      2005         Malignant neoplasm of central portion of femal* February *     Comment:stage II,T2, N1a. Mastectomy with sentinel node              biopsy. 2 sentinel nodes negative, non-sentinel              node w/ 5 mm macrometastatic foci in               non-sentinel node.  Received post mastectomy               radiation.    Anxiety                                                      GERD (gastroesophageal reflux disease)                       Cancer [facility])                                                   Comment:Breast   Past Surgical History:   BREAST SURGERY                                  Right 2005,2014      Comment:biopsy  BREAST SURGERY                                   Right 2014           Comment:mastectomy   KNEE SURGERY                                    Left 2005         ABDOMINAL HYSTERECTOMY                           1999         thumb surg                                      Right 2004         TONSILLECTOMY                                                   Comment:age of 3   COLONOSCOPY                                      2002         BREAST BIOPSY                                   Left 2015           Comment:Negative   MASTECTOMY                                      Right 2014           Comment:radiation chemo  BMI    Body Mass Index   39.45 kg/m 2      Reproductive/Obstetrics negative OB ROS                             Anesthesia Physical Anesthesia Plan  ASA: III  Anesthesia Plan: General   Post-op Pain Management:    Induction:   Airway Management Planned:   Additional Equipment:   Intra-op Plan:   Post-operative Plan:   Informed Consent: I have reviewed the patients History and Physical, chart, labs and discussed the procedure including the risks, benefits and alternatives for the proposed anesthesia with the patient or authorized representative who has indicated his/her understanding and acceptance.   Dental Advisory Given  Plan Discussed with: Anesthesiologist, CRNA and Surgeon  Anesthesia Plan Comments:         Anesthesia Quick Evaluation

## 2015-10-16 NOTE — Transfer of Care (Signed)
Immediate Anesthesia Transfer of Care Note  Patient: Erin Romero  Procedure(s) Performed: Procedure(s): COLONOSCOPY WITH PROPOFOL (N/A)  Patient Location: PACU  Anesthesia Type:General  Level of Consciousness: awake  Airway & Oxygen Therapy: Patient Spontanous Breathing  Post-op Assessment: Report given to RN  Post vital signs: stable  Last Vitals:  Filed Vitals:   10/16/15 1340  BP: 145/64  Pulse:   Temp: 37.2 C  Resp: 16    Complications: No apparent anesthesia complications

## 2015-10-16 NOTE — Anesthesia Postprocedure Evaluation (Signed)
  Anesthesia Post-op Note  Patient: Erin Romero  Procedure(s) Performed: Procedure(s): COLONOSCOPY WITH PROPOFOL (N/A)  Anesthesia type:General  Patient location: PACU  Post pain: Pain level controlled  Post assessment: Post-op Vital signs reviewed, Patient's Cardiovascular Status Stable, Respiratory Function Stable, Patent Airway and No signs of Nausea or vomiting  Post vital signs: Reviewed and stable  Last Vitals:  Filed Vitals:   10/16/15 1410  BP: 123/60  Pulse: 76  Temp:   Resp: 13    Level of consciousness: awake, alert  and patient cooperative  Complications: No apparent anesthesia complications

## 2015-10-16 NOTE — Op Note (Signed)
[facility] Gastroenterology Patient Name: Erin Romero Procedure Date: 10/16/2015 1:20 PM MRN: [id number] Account #: [id number] Date of Birth: [birth year] Admit Type: Outpatient Age: 64 Room: [facility] ENDO ROOM 4 Gender: Female Note Status: Finalized Procedure:         Colonoscopy Indications:       Screening for colorectal malignant neoplasm Providers:         Robert Bellow, MD Referring MD:      Jerrell Belfast, MD (Referring MD) Medicines:         Monitored Anesthesia Care Complications:     No immediate complications. Procedure:         Pre-Anesthesia Assessment:                    - Prior to the procedure, a History and Physical was                     performed, and patient medications, allergies and                     sensitivities were reviewed. The patient's tolerance of                     previous anesthesia was reviewed.                    - The risks and benefits of the procedure and the sedation                     options and risks were discussed with the patient. All                     questions were answered and informed consent was obtained.                    After obtaining informed consent, the colonoscope was                     passed under direct vision. Throughout the procedure, the                     patient's blood pressure, pulse, and oxygen saturations                     were monitored continuously. The Colonoscope was                     introduced through the anus and advanced to the the cecum,                     identified by appendiceal orifice and ileocecal valve. The                     colonoscopy was performed without difficulty. The patient                     tolerated the procedure well. The quality of the bowel                     preparation was excellent. Findings:      A single small angiodysplastic lesion without bleeding was found in the       descending colon.      The retroflexed view of the distal  rectum and anal verge was normal and  showed no anal or rectal abnormalities. Impression:        - A single non-bleeding colonic angiodysplastic lesion.                    - The distal rectum and anal verge are normal on                     retroflexion view.                    - No specimens collected. Recommendation:    - Repeat colonoscopy in 10 years for screening purposes. Diagnosis Code(s): --- Professional ---                    Z12.11, Encounter for screening for malignant neoplasm of                     colon                    K55.20, Angiodysplasia of colon without hemorrhage Robert Bellow, MD 10/16/2015 1:36:42 PM This report has been signed electronically. Number of Addenda: 0 Note Initiated On: 10/16/2015 1:20 PM Scope Withdrawal Time: 0 hours 7 minutes 33 seconds  Total Procedure Duration: 0 hours 11 minutes 48 seconds       [facility]

## 2015-10-16 NOTE — Anesthesia Procedure Notes (Signed)
Date/Time: 10/16/2015 1:22 PM Performed by: JIZXYOF, Laquida Cotrell Oxygen Delivery Method: Nasal cannula

## 2015-10-18 ENCOUNTER — Encounter: Payer: Self-pay | Admitting: General Surgery

## 2015-11-06 ENCOUNTER — Other Ambulatory Visit: Payer: Self-pay | Admitting: Hematology and Oncology

## 2015-11-07 ENCOUNTER — Encounter: Payer: Self-pay | Admitting: Hematology and Oncology

## 2015-11-07 NOTE — Progress Notes (Signed)
[place] [facility] day:  05/22/2015  Chief Complaint: Erin Romero is a 64 y.o. female with stage IIB right breast cancer who is sen for reassessment.  HPI: The patient presented with an abnormal mammogram.  Mammogram and ultrasound revealed a 2.5 cm subareolar mass in the right breast concerning for malignancy.  Biopsy revealed invasive ductal carcinoma.   She underwent right modified radical mastectomy sentinel lymph node biopsy on 01/24/2013. Pathology revealed a 3 cm grade II invasive mammary carcinoma.  Zero of 2 sentinel lymph nodes were positive.  A non-sentinel lymph node was positive for a 0.5 cm macrometastasis with extracapsular extension.  DCIS was present. Estrogen receptor was positive (greater than 90%), progesterone receptor positive (greater than 90%), and HER-2/neu negative.  She received 3 cycles of Taxotere and Cytoxan (03/08/2013 - 04/20/2013). Treatment was complicated by erythromelalgia and an urticarial reaction treated with Decadron Benadryl and sertraline. She did not receive any further chemotherapy.  She received radiation from 05/30/2013 until 07/19/2013. She tolerated it well.  She then began Femara.  Mammogram on 02/06/2014 was suspicious for left breast mass.  She underwent a biopsy by Dr. Bary Castilla 12/20/2014. Pathology was negative for malignancy.  Symptomatically, he should notes that she "always has aches and pains". She notes hot flashes.  She notes a terrible fungal infection under her breasts with excoriation. She is a taking letrozole. She initially felt achy after initiation of letrozole.  Past Medical History  Diagnosis Date  . Hypertension 2005  . Atrial fibrillation ([place]) 2014  . Lump or mass in breast 2014    right mastectomy  . Fibrocystic breast disease 2005  . Malignant neoplasm of central portion of female breast ([place]) January 24, 2013    stage II,T2, N1a. Mastectomy with sentinel node biopsy. 2  sentinel nodes negative, non-sentinel node w/ 5 mm macrometastatic foci in non-sentinel node.  Received post mastectomy radiation.   Marland Kitchen Anxiety   . GERD (gastroesophageal reflux disease)   . Cancer [facility]     Breast     Past Surgical History  Procedure Laterality Date  . Breast surgery Right 2005,2014    biopsy  . Breast surgery Right 2014    mastectomy  . Knee surgery Left 2005  . Abdominal hysterectomy  1999  . Thumb surg Right 2004  . Tonsillectomy      age of 18  . Colonoscopy  2002  . Breast biopsy Left 2015    Negative  . Mastectomy Right 2014    radiation chemo  . Colonoscopy with propofol N/A 10/16/2015    Procedure: COLONOSCOPY WITH PROPOFOL;  Surgeon: Robert Bellow, MD;  Location: [facility] ENDOSCOPY;  Service: Endoscopy;  Laterality: N/A;    Family History  Problem Relation Age of Onset  . Cancer Other     ovarian,liver,colon cancer-no family member listed  . Heart disease Father   . Osteoporosis Paternal Aunt   . Osteoporosis Paternal Grandmother   . Hypertension Sister   . Cancer Mother     cervical    Social History:  reports that she has never smoked. She has never used smokeless tobacco. She reports that she does not drink alcohol or use illicit drugs.  her husband died last week. The patient is alone today.  Allergies:  Allergies  Allergen Reactions  . Taxotere [Docetaxel] Itching and Rash  . Sulfa Antibiotics Hives    Current Medications: Current Outpatient Prescriptions  Medication Sig Dispense Refill  . acetaminophen (TYLENOL)  500 MG tablet Take 500-1,000 mg by mouth every 4 (four) hours as needed for pain (q 4-6 prn).    Marland Kitchen amLODipine (NORVASC) 5 MG tablet Take 2.5 mg by mouth daily.     . Ascorbic Acid (VITAMIN C) 1000 MG tablet Take 1,000 mg by mouth daily.    Marland Kitchen aspirin 81 MG chewable tablet Chew 81 mg by mouth daily.    . bisoprolol-hydrochlorothiazide (ZIAC) 10-6.25 MG per tablet Take 1 tablet by mouth daily.    . Calcium  Carb-Cholecalciferol (OYSTER SHELL CALCIUM + D PO) Take by mouth.    . Cholecalciferol (VITAMIN D-3 PO) Take 2,000 Units by mouth daily.    Marland Kitchen docusate sodium (COLACE) 100 MG capsule Take 100 mg by mouth 2 (two) times daily.    . hydrochlorothiazide (MICROZIDE) 12.5 MG capsule Take 12.5 mg by mouth daily.    Marland Kitchen omeprazole (PRILOSEC OTC) 20 MG tablet Take 40 mg by mouth daily.     . traZODone (DESYREL) 50 MG tablet Take 50 mg by mouth at bedtime.    Marland Kitchen warfarin (COUMADIN) 2 MG tablet Take 2 mg by mouth daily.    Marland Kitchen ESCITALOPRAM OXALATE PO Take 10 mg by mouth.    . letrozole (FEMARA) 2.5 MG tablet TAKE 1 TABLET EVERY DAY 90 tablet 0  . polyethylene glycol powder (GLYCOLAX/MIRALAX) powder USE AS DIRECTED  FOR  COLONOSCOPY  PREP 255 g 0   No current facility-administered medications for this visit.    Review of Systems:  GENERAL:  Feels"ok".  No fevers, sweats or weight loss. PERFORMANCE STATUS (ECOG):  1 HEENT:  No visual changes, runny nose, sore throat, mouth sores or tenderness. Lungs: No shortness of breath or cough.  No hemoptysis. Cardiac:  No chest pain, palpitations, orthopnea, or PND. GI:  No nausea, vomiting, diarrhea, constipation, melena or hematochezia. GU:  No urgency, frequency, dysuria, or hematuria. Musculoskeletal:  No back pain.  No joint pain.  No muscle tenderness. Extremities:  No pain or swelling. Skin:  Yeast infection under breasts.  Pruritic leg rash. Neuro:  No headache, numbness or weakness, balance or coordination issues. Endocrine:  Vasomoter symptoms.  No diabetes, thyroid issues, hot flashes or night sweats. Psych:  No mood changes, depression or anxiety. Pain:  No focal pain. Review of systems:  All other systems reviewed and found to be negative.  Physical Exam: Blood pressure 143/91, pulse 81, temperature 98.4 F (36.9 C), temperature source Oral, resp. rate 19, height 5' 4"  (1.626 m), weight 230 lb 4.3 oz (104.45 kg). GENERAL:  Well developed, well  nourished, sitting comfortably in the exam room in no acute distress. MENTAL STATUS:  Alert and oriented to person, place and time. HEAD:  Short brown hair.  Normocephalic, atraumatic, face symmetric, no Cushingoid features. EYES:  Dark hazel eyes.  Pupils equal round and reactive to light and accomodation.  No conjunctivitis or scleral icterus. ENT:  Oropharynx clear without lesion.  Tongue normal. Mucous membranes moist.  RESPIRATORY:  Clear to auscultation without rales, wheezes or rhonchi. CARDIOVASCULAR:  Regular rate and rhythm without murmur, rub or gallop. BREAST:  Right mastectomy.  No erythema or nodularity.  Left breast without masses, skin changes or nipple discharge.  ABDOMEN:  Soft, non-tender, with active bowel sounds, and no hepatosplenomegaly.  No masses. SKIN:  Right lower extremity rash. EXTREMITIES: No edema, no skin discoloration or tenderness.  No palpable cords. LYMPH NODES: No palpable cervical, supraclavicular, axillary or inguinal adenopathy  NEUROLOGICAL: Unremarkable. PSYCH:  Appropriate.  Appointment on 05/22/2015  Component Date Value Ref Range Status  . WBC 05/22/2015 10.0  3.6 - 11.0 K/uL Final  . RBC 05/22/2015 4.62  3.80 - 5.20 MIL/uL Final  . Hemoglobin 05/22/2015 13.5  12.0 - 16.0 g/dL Final  . HCT 05/22/2015 40.1  35.0 - 47.0 % Final  . MCV 05/22/2015 86.8  80.0 - 100.0 fL Final  . MCH 05/22/2015 29.3  26.0 - 34.0 pg Final  . MCHC 05/22/2015 33.8  32.0 - 36.0 g/dL Final  . RDW 05/22/2015 15.6* 11.5 - 14.5 % Final  . Platelets 05/22/2015 207  150 - 440 K/uL Final  . Total Protein 05/22/2015 7.5  6.5 - 8.1 g/dL Final  . Albumin 05/22/2015 3.8  3.5 - 5.0 g/dL Final  . AST 05/22/2015 23  15 - 41 U/L Final  . ALT 05/22/2015 17  14 - 54 U/L Final  . Alkaline Phosphatase 05/22/2015 69  38 - 126 U/L Final  . Total Bilirubin 05/22/2015 0.7  0.3 - 1.2 mg/dL Final  . Bilirubin, Direct 05/22/2015 0.1  0.1 - 0.5 mg/dL Final  . Indirect Bilirubin 05/22/2015 0.6   0.3 - 0.9 mg/dL Final  . CA 27.29 05/22/2015 28.8  0.0 - 38.6 U/mL Final   Comment: (NOTE) [employer]/ACS methodology Performed At: [facility] [address] [place], [place] [id number] Lindon Romp MD TM:[id number]     Assessment:  Melaine Mcphee is a 64 y.o. female with a history of stage IIB (T2N1M0) right breast cancer status post radical mastectomy on 01/24/2013. Pathology revealed a 3 cm grade II invasive mammary carcinoma.  Zero of 2 sentinel lymph nodes were positive.  A non-sentinel lymph node was positive for a 0.5 cm macrometastasis with extracapsular extension.  DCIS was present. Estrogen receptor was> 90%, progesterone receptor greater than 90%, and HER-2/neu negative.  She is s/p 3 cycles of Taxotere and Cytoxan (03/08/2013 - 04/20/2013). Treatment was complicated by erythromelalgia and an urticarial reaction. She did not receive any further chemotherapy.  She received radiation from 05/30/2013 until 07/19/2013. She began Femara.  Mammogram on 02/06/2014 was suspicious for left breast mass.  Biopsy on 02/27/2014 was negative for malignancy.  Symptomatically, she notes  "always has aches and pains" and hot flashes.  She has a fungal infection under her breasts with excoriation.  She has mild vasomotor symptoms.  Plan: 1. Review entire medical history, diagnosis and management of breast cancer. 2. Labs today:  CBC with diff, LFTs, CA27.29. 3. Schedule bone density study. 4. Discuss calcium and vitamin D. 5. Continue Femara. 6. Port flush every 8 weeks until removal. 7. Anticipate follow-up mammogram per Dr. Bary Castilla. 8. RTC in 6 months for MD assess and labs (CBC with diff, CMP, CA27.29).   Lequita Asal, MD  05/22/2015

## 2015-11-13 DIAGNOSIS — I480 Paroxysmal atrial fibrillation: Secondary | ICD-10-CM | POA: Diagnosis not present

## 2015-11-20 ENCOUNTER — Ambulatory Visit: Payer: Commercial Managed Care - HMO | Admitting: Hematology and Oncology

## 2015-11-20 ENCOUNTER — Other Ambulatory Visit: Payer: Commercial Managed Care - HMO

## 2015-11-23 ENCOUNTER — Other Ambulatory Visit: Payer: Self-pay

## 2015-11-23 DIAGNOSIS — C50911 Malignant neoplasm of unspecified site of right female breast: Secondary | ICD-10-CM

## 2015-11-25 ENCOUNTER — Other Ambulatory Visit: Payer: Self-pay | Admitting: Hematology and Oncology

## 2015-11-26 ENCOUNTER — Inpatient Hospital Stay: Payer: Commercial Managed Care - HMO

## 2015-11-26 ENCOUNTER — Inpatient Hospital Stay: Payer: Commercial Managed Care - HMO | Attending: Hematology and Oncology | Admitting: Hematology and Oncology

## 2015-11-26 VITALS — BP 155/86 | HR 75 | Temp 98.6°F | Resp 18 | Ht 64.0 in | Wt 235.7 lb

## 2015-11-26 DIAGNOSIS — R21 Rash and other nonspecific skin eruption: Secondary | ICD-10-CM | POA: Insufficient documentation

## 2015-11-26 DIAGNOSIS — M8580 Other specified disorders of bone density and structure, unspecified site: Secondary | ICD-10-CM

## 2015-11-26 DIAGNOSIS — Z8541 Personal history of malignant neoplasm of cervix uteri: Secondary | ICD-10-CM | POA: Insufficient documentation

## 2015-11-26 DIAGNOSIS — Z9221 Personal history of antineoplastic chemotherapy: Secondary | ICD-10-CM | POA: Diagnosis not present

## 2015-11-26 DIAGNOSIS — Z8041 Family history of malignant neoplasm of ovary: Secondary | ICD-10-CM | POA: Diagnosis not present

## 2015-11-26 DIAGNOSIS — Z79899 Other long term (current) drug therapy: Secondary | ICD-10-CM

## 2015-11-26 DIAGNOSIS — Z7982 Long term (current) use of aspirin: Secondary | ICD-10-CM | POA: Diagnosis not present

## 2015-11-26 DIAGNOSIS — N6019 Diffuse cystic mastopathy of unspecified breast: Secondary | ICD-10-CM | POA: Diagnosis not present

## 2015-11-26 DIAGNOSIS — Z923 Personal history of irradiation: Secondary | ICD-10-CM | POA: Insufficient documentation

## 2015-11-26 DIAGNOSIS — I10 Essential (primary) hypertension: Secondary | ICD-10-CM | POA: Insufficient documentation

## 2015-11-26 DIAGNOSIS — Z170 Estrogen receptor positive status [ER+]: Secondary | ICD-10-CM | POA: Insufficient documentation

## 2015-11-26 DIAGNOSIS — I4891 Unspecified atrial fibrillation: Secondary | ICD-10-CM | POA: Insufficient documentation

## 2015-11-26 DIAGNOSIS — C50911 Malignant neoplasm of unspecified site of right female breast: Secondary | ICD-10-CM

## 2015-11-26 DIAGNOSIS — K219 Gastro-esophageal reflux disease without esophagitis: Secondary | ICD-10-CM | POA: Diagnosis not present

## 2015-11-26 DIAGNOSIS — Z7901 Long term (current) use of anticoagulants: Secondary | ICD-10-CM | POA: Insufficient documentation

## 2015-11-26 DIAGNOSIS — C50111 Malignant neoplasm of central portion of right female breast: Secondary | ICD-10-CM

## 2015-11-26 DIAGNOSIS — F419 Anxiety disorder, unspecified: Secondary | ICD-10-CM

## 2015-11-26 DIAGNOSIS — Z800 Family history of malignant neoplasm of digestive organs: Secondary | ICD-10-CM

## 2015-11-26 LAB — CBC WITH DIFFERENTIAL/PLATELET
Basophils Absolute: 0.1 10*3/uL (ref 0–0.1)
Basophils Relative: 1 %
Eosinophils Absolute: 0.1 10*3/uL (ref 0–0.7)
Eosinophils Relative: 1 %
HCT: 37.4 % (ref 35.0–47.0)
Hemoglobin: 12.9 g/dL (ref 12.0–16.0)
Lymphocytes Relative: 19 %
Lymphs Abs: 1.7 10*3/uL (ref 1.0–3.6)
MCH: 29.5 pg (ref 26.0–34.0)
MCHC: 34.5 g/dL (ref 32.0–36.0)
MCV: 85.7 fL (ref 80.0–100.0)
Monocytes Absolute: 0.7 10*3/uL (ref 0.2–0.9)
Monocytes Relative: 8 %
Neutro Abs: 6.3 10*3/uL (ref 1.4–6.5)
Neutrophils Relative %: 71 %
Platelets: 196 10*3/uL (ref 150–440)
RBC: 4.37 MIL/uL (ref 3.80–5.20)
RDW: 15.1 % — ABNORMAL HIGH (ref 11.5–14.5)
WBC: 8.9 10*3/uL (ref 3.6–11.0)

## 2015-11-26 LAB — COMPREHENSIVE METABOLIC PANEL
ALT: 14 U/L (ref 14–54)
AST: 22 U/L (ref 15–41)
Albumin: 3.5 g/dL (ref 3.5–5.0)
Alkaline Phosphatase: 65 U/L (ref 38–126)
Anion gap: 9 (ref 5–15)
BUN: 13 mg/dL (ref 6–20)
CO2: 27 mmol/L (ref 22–32)
Calcium: 8.6 mg/dL — ABNORMAL LOW (ref 8.9–10.3)
Chloride: 98 mmol/L — ABNORMAL LOW (ref 101–111)
Creatinine, Ser: 0.7 mg/dL (ref 0.44–1.00)
GFR calc Af Amer: >60 mL/min (ref >60)
GFR calc non Af Amer: >60 mL/min (ref >60)
Glucose, Bld: 133 mg/dL — ABNORMAL HIGH (ref 65–99)
Potassium: 3.2 mmol/L — ABNORMAL LOW (ref 3.5–5.1)
Sodium: 134 mmol/L — ABNORMAL LOW (ref 135–145)
Total Bilirubin: 0.7 mg/dL (ref 0.3–1.2)
Total Protein: 7.4 g/dL (ref 6.5–8.1)

## 2015-11-26 MED ORDER — SODIUM CHLORIDE 0.9 % IJ SOLN
10.0000 mL | INTRAMUSCULAR | Status: DC | PRN
  Administered 2015-11-26: 10 mL via INTRAVENOUS
  Filled 2015-11-26: qty 10

## 2015-11-26 MED ORDER — HEPARIN SOD (PORK) LOCK FLUSH 100 UNIT/ML IV SOLN
500.0000 [IU] | Freq: Once | INTRAVENOUS | Status: AC
  Administered 2015-11-26: 500 [IU] via INTRAVENOUS

## 2015-11-26 NOTE — Progress Notes (Signed)
[facility]-  Cancer Center  Clinic day:  11/26/2015   Chief Complaint: Luanne Ward Wurster is a 64 y.o. female with stage IIB right breast cancer who is seen for 6 month assessment.  HPI: The patient was last seen in the medical oncology clinic on 05/22/2015.  At that time, she was seen for initial assessment by me.  Symptomatically, she noted that she "always had aches and pains". She had mild vasomotor symptoms.  She had a fungal rash with excoriations under her breasts. She was a taking letrozole.  CA27.29 was 26.8.  Bone density study on 06/05/2015 revealed a T score of -1.9 (osteopenia) in the left femoral neck.  Left-sided mammogram on 09/13/2015 was negative.  Symptomatically, she denies concerns. She still has a rash beneath her breasts.  Past Medical History  Diagnosis Date  . Hypertension 2005  . Atrial fibrillation ([facility]) 2014  . Lump or mass in breast 2014    right mastectomy  . Fibrocystic breast disease 2005  . Malignant neoplasm of central portion of female breast ([facility]) January 24, 2013    stage II,T2, N1a. Mastectomy with sentinel node biopsy. 2 sentinel nodes negative, non-sentinel node w/ 5 mm macrometastatic foci in non-sentinel node.  Received post mastectomy radiation.   . Anxiety   . GERD (gastroesophageal reflux disease)   . Cancer ([facility])     Breast     Past Surgical History  Procedure Laterality Date  . Breast surgery Right 2005,2014    biopsy  . Breast surgery Right 2014    mastectomy  . Knee surgery Left 2005  . Abdominal hysterectomy  1999  . Thumb surg Right 2004  . Tonsillectomy      age of 3  . Colonoscopy  2002  . Breast biopsy Left 2015    Negative  . Mastectomy Right 2014    radiation chemo  . Colonoscopy with propofol N/A 10/16/2015    Procedure: COLONOSCOPY WITH PROPOFOL;  Surgeon: Jeffrey W Byrnett, MD;  Location: [facility] ENDOSCOPY;  Service: Endoscopy;  Laterality: N/A;    Family History  Problem Relation Age of  Onset  . Cancer Other     ovarian,liver,colon cancer-no family member listed  . Heart disease Father   . Osteoporosis Paternal Aunt   . Osteoporosis Paternal Grandmother   . Hypertension Sister   . Cancer Mother     cervical    Social History:  reports that she has never smoked. She has never used smokeless tobacco. She reports that she does not drink alcohol or use illicit drugs.  her husband died last week. The patient is alone today.  Allergies:  Allergies  Allergen Reactions  . Taxotere [Docetaxel] Itching and Rash  . Sulfa Antibiotics Hives    Current Medications: Current Outpatient Prescriptions  Medication Sig Dispense Refill  . acetaminophen (TYLENOL) 500 MG tablet Take 500-1,000 mg by mouth every 4 (four) hours as needed for pain (q 4-6 prn).    . amLODipine (NORVASC) 5 MG tablet Take 2.5 mg by mouth daily.     . Ascorbic Acid (VITAMIN C) 1000 MG tablet Take 1,000 mg by mouth daily.    . aspirin 81 MG chewable tablet Chew 81 mg by mouth daily.    . bisoprolol-hydrochlorothiazide (ZIAC) 10-6.25 MG per tablet Take 1 tablet by mouth daily.    . Calcium Carb-Cholecalciferol (OYSTER SHELL CALCIUM + D PO) Take by mouth.    . Cholecalciferol (VITAMIN D-3 PO) Take 2,000 Units by mouth daily.    .   docusate sodium (COLACE) 100 MG capsule Take 100 mg by mouth 2 (two) times daily.    . ESCITALOPRAM OXALATE PO Take 10 mg by mouth.    . hydrochlorothiazide (MICROZIDE) 12.5 MG capsule Take 12.5 mg by mouth daily.    . letrozole (FEMARA) 2.5 MG tablet TAKE 1 TABLET EVERY DAY 90 tablet 0  . omeprazole (PRILOSEC OTC) 20 MG tablet Take 40 mg by mouth daily.     . polyethylene glycol powder (GLYCOLAX/MIRALAX) powder USE AS DIRECTED  FOR  COLONOSCOPY  PREP 255 g 0  . traZODone (DESYREL) 50 MG tablet Take 50 mg by mouth at bedtime.    . warfarin (COUMADIN) 2 MG tablet Take 2 mg by mouth daily.     Current Facility-Administered Medications  Medication Dose Route Frequency Provider Last Rate  Last Dose  . sodium chloride 0.9 % injection 10 mL  10 mL Intravenous PRN Leslie F Herring, NP   10 mL at 11/26/15 1042    Review of Systems:  GENERAL:  Feels fine.  No fevers, sweats or weight loss. PERFORMANCE STATUS (ECOG):  1 HEENT:  No visual changes, runny nose, sore throat, mouth sores or tenderness. Lungs: No shortness of breath or cough.  No hemoptysis. Cardiac:  No chest pain, palpitations, orthopnea, or PND. GI:  No nausea, vomiting, diarrhea, constipation, melena or hematochezia. GU:  No urgency, frequency, dysuria, or hematuria. Musculoskeletal:  No back pain.  No joint pain.  No muscle tenderness. Extremities:  No pain or swelling. Skin:  Yeast infection under breasts.  Pruritic leg rash. Neuro:  No headache, numbness or weakness, balance or coordination issues. Endocrine:  Vasomoter symptoms.  No diabetes, thyroid issues, hot flashes or night sweats. Psych:  No mood changes, depression or anxiety. Pain:  No focal pain. Review of systems:  All other systems reviewed and found to be negative.  Physical Exam: Blood pressure 155/86, pulse 75, temperature 98.6 F (37 C), temperature source Tympanic, resp. rate 18, height 5' 4" (1.626 m), weight 235 lb 10.8 oz (106.9 kg). GENERAL:  Well developed, well nourished, sitting comfortably in the exam room in no acute distress. MENTAL STATUS:  Alert and oriented to person, place and time. HEAD:  Short brown hair.  Normocephalic, atraumatic, face symmetric, no Cushingoid features. EYES:  Glasses.  Dark hazel eyes.  Pupils equal round and reactive to light and accomodation.  No conjunctivitis or scleral icterus. ENT:  Oropharynx clear without lesion.  Tongue normal. Mucous membranes moist.  RESPIRATORY:  Clear to auscultation without rales, wheezes or rhonchi. CARDIOVASCULAR:  Regular rate and rhythm without murmur, rub or gallop. BREAST:  Right mastectomy.  No erythema or nodularity.  Left breast without masses, skin changes or nipple  discharge.  ABDOMEN:  Soft, non-tender, with active bowel sounds, and no hepatosplenomegaly.  No masses. SKIN:  Left breast excoriations inferiorly. EXTREMITIES: No edema, no skin discoloration or tenderness.  No palpable cords. LYMPH NODES: No palpable cervical, supraclavicular, axillary or inguinal adenopathy  NEUROLOGICAL: Unremarkable. PSYCH:  Appropriate.  Office Visit on 11/26/2015  Component Date Value Ref Range Status  . WBC 11/26/2015 8.9  3.6 - 11.0 K/uL Final   A-LINE DRAW  . RBC 11/26/2015 4.37  3.80 - 5.20 MIL/uL Final  . Hemoglobin 11/26/2015 12.9  12.0 - 16.0 g/dL Final  . HCT 11/26/2015 37.4  35.0 - 47.0 % Final  . MCV 11/26/2015 85.7  80.0 - 100.0 fL Final  . MCH 11/26/2015 29.5  26.0 - 34.0 pg Final  .   MCHC 11/26/2015 34.5  32.0 - 36.0 g/dL Final  . RDW 11/26/2015 15.1* 11.5 - 14.5 % Final  . Platelets 11/26/2015 196  150 - 440 K/uL Final  . Neutrophils Relative % 11/26/2015 71   Final  . Neutro Abs 11/26/2015 6.3  1.4 - 6.5 K/uL Final  . Lymphocytes Relative 11/26/2015 19   Final  . Lymphs Abs 11/26/2015 1.7  1.0 - 3.6 K/uL Final  . Monocytes Relative 11/26/2015 8   Final  . Monocytes Absolute 11/26/2015 0.7  0.2 - 0.9 K/uL Final  . Eosinophils Relative 11/26/2015 1   Final  . Eosinophils Absolute 11/26/2015 0.1  0 - 0.7 K/uL Final  . Basophils Relative 11/26/2015 1   Final  . Basophils Absolute 11/26/2015 0.1  0 - 0.1 K/uL Final  . Sodium 11/26/2015 134* 135 - 145 mmol/L Final  . Potassium 11/26/2015 3.2* 3.5 - 5.1 mmol/L Final  . Chloride 11/26/2015 98* 101 - 111 mmol/L Final  . CO2 11/26/2015 27  22 - 32 mmol/L Final  . Glucose, Bld 11/26/2015 133* 65 - 99 mg/dL Final  . BUN 11/26/2015 13  6 - 20 mg/dL Final  . Creatinine, Ser 11/26/2015 0.70  0.44 - 1.00 mg/dL Final  . Calcium 11/26/2015 8.6* 8.9 - 10.3 mg/dL Final  . Total Protein 11/26/2015 7.4  6.5 - 8.1 g/dL Final  . Albumin 11/26/2015 3.5  3.5 - 5.0 g/dL Final  . AST 11/26/2015 22  15 - 41 U/L Final   . ALT 11/26/2015 14  14 - 54 U/L Final  . Alkaline Phosphatase 11/26/2015 65  38 - 126 U/L Final  . Total Bilirubin 11/26/2015 0.7  0.3 - 1.2 mg/dL Final  . GFR calc non Af Amer 11/26/2015 >60  >60 mL/min Final  . GFR calc Af Amer 11/26/2015 >60  >60 mL/min Final   Comment: (NOTE) The eGFR has been calculated using the CKD EPI equation. This calculation has not been validated in all clinical situations. eGFR's persistently <60 mL/min signify possible Chronic Kidney Disease.   . Anion gap 11/26/2015 9  5 - 15 Final    Assessment:  Erin Romero is a 64 y.o. female with a history of stage IIB (T2N1M0) right breast cancer status post radical mastectomy on 01/24/2013. Pathology revealed a 3 cm grade II invasive mammary carcinoma.  Zero of 2 sentinel lymph nodes were positive.  A non-sentinel lymph node was positive for a 0.5 cm macrometastasis with extracapsular extension.  DCIS was present. Estrogen receptor was> 90%, progesterone receptor greater than 90%, and HER-2/neu negative.  She is s/p 3 cycles of Taxotere and Cytoxan (03/08/2013 - 04/20/2013). Treatment was complicated by erythromelalgia and an urticarial reaction. She did not receive any further chemotherapy.  She received radiation from 05/30/2013 until 07/19/2013. She began Femara.  Mammogram on 02/06/2014 was suspicious for left breast mass.  Biopsy on 02/27/2014 was negative for malignancy.  Left-sided mammogram on 09/13/2015 was negative.  CA27.29 was 26.3 on 11/26/2015.  Bone density study on 06/05/2015 revealed a T score of -1.9 (osteopenia) in the left femoral neck.  Symptomatically, feels good.  She has a left breast excoriations due to a moist fungal infection beneath her breasts.  She has mild vasomotor symptoms.  Plan: 1. Labs today:  CBC with diff, CMP, CA27.29. 2. Review mammogram and bone density study.  Discuss calcium and vitamin D.  Patient declines Prolia or Fosamax. 3. Continue Femara. 4. Port flush every  8 weeks until removal.  Schedule port   removal. 5. Continue Nystatin powder topically. 6. RTC in 6 months for MD assessment and labs (CBC with diff, CMP, CA27.29).   Melissa C Corcoran, MD  11/26/2015 , 11:45 AM  

## 2015-11-26 NOTE — Progress Notes (Signed)
Patient is here for follow-up of breast cancer. Patient states that she has been doing well and offers no complaints today.

## 2015-11-27 ENCOUNTER — Telehealth: Payer: Self-pay

## 2015-11-27 LAB — CANCER ANTIGEN 27.29: CA 27.29: 26.3 U/mL (ref 0.0–38.6)

## 2015-11-27 NOTE — Telephone Encounter (Signed)
Called pt per MD.  Per pt she takes Hydrochlorothiazide.  Potassium is  3.2 per pt she does not take any potassium at home.  Will inform MD

## 2015-11-28 ENCOUNTER — Other Ambulatory Visit: Payer: Self-pay

## 2015-11-28 MED ORDER — POTASSIUM CHLORIDE CRYS ER 10 MEQ PO TBCR: 10.0000 meq | EXTENDED_RELEASE_TABLET | Freq: Two times a day (BID) | ORAL | Status: DC

## 2015-12-11 ENCOUNTER — Ambulatory Visit (INDEPENDENT_AMBULATORY_CARE_PROVIDER_SITE_OTHER): Payer: Commercial Managed Care - HMO | Admitting: General Surgery

## 2015-12-11 ENCOUNTER — Encounter: Payer: Self-pay | Admitting: General Surgery

## 2015-12-11 VITALS — BP 168/94 | HR 84 | Resp 16 | Ht 64.0 in | Wt 235.0 lb

## 2015-12-11 DIAGNOSIS — C50119 Malignant neoplasm of central portion of unspecified female breast: Secondary | ICD-10-CM

## 2015-12-11 NOTE — Progress Notes (Signed)
Patient ID: Erin Romero, female   DOB: June 25, 1951, 64 y.o.   MRN: [id number]  Chief Complaint  Patient presents with  . Procedure    port removal     HPI Erin Romero is a 64 y.o. female here today for a port removal. The patient's history was personally reviewed. HPI  Past Medical History  Diagnosis Date  . Hypertension 2005  . Atrial fibrillation ([place]) 2014  . Lump or mass in breast 2014    right mastectomy  . Fibrocystic breast disease 2005  . Malignant neoplasm of central portion of female breast ([place]) January 24, 2013    stage II,T2, N1a. Mastectomy with sentinel node biopsy. 2 sentinel nodes negative, non-sentinel node w/ 5 mm macrometastatic foci in non-sentinel node.  Received post mastectomy radiation.   Marland Kitchen Anxiety   . GERD (gastroesophageal reflux disease)   . Cancer [facility])     Breast     Past Surgical History  Procedure Laterality Date  . Breast surgery Right 2005,2014    biopsy  . Breast surgery Right 2014    mastectomy  . Knee surgery Left 2005  . Abdominal hysterectomy  1999  . Thumb surg Right 2004  . Tonsillectomy      age of 68  . Colonoscopy  2002  . Breast biopsy Left 2015    Negative  . Mastectomy Right 2014    radiation chemo  . Colonoscopy with propofol N/A 10/16/2015    Procedure: COLONOSCOPY WITH PROPOFOL;  Surgeon: Robert Bellow, MD;  Location: [facility] ENDOSCOPY;  Service: Endoscopy;  Laterality: N/A;    Family History  Problem Relation Age of Onset  . Cancer Other     ovarian,liver,colon cancer-no family member listed  . Heart disease Father   . Osteoporosis Paternal Aunt   . Osteoporosis Paternal Grandmother   . Hypertension Sister   . Cancer Mother     cervical    Social History Social History  Substance Use Topics  . Smoking status: Never Smoker   . Smokeless tobacco: Never Used  . Alcohol Use: No    Allergies  Allergen Reactions  . Taxotere [Docetaxel] Itching and Rash  . Sulfa Antibiotics Hives     Current Outpatient Prescriptions  Medication Sig Dispense Refill  . acetaminophen (TYLENOL) 500 MG tablet Take 500-1,000 mg by mouth every 4 (four) hours as needed for pain (q 4-6 prn).    Marland Kitchen amLODipine (NORVASC) 5 MG tablet Take 2.5 mg by mouth daily.     . Ascorbic Acid (VITAMIN C) 1000 MG tablet Take 1,000 mg by mouth daily.    Marland Kitchen aspirin 81 MG chewable tablet Chew 81 mg by mouth daily.    . bisoprolol-hydrochlorothiazide (ZIAC) 10-6.25 MG per tablet Take 1 tablet by mouth daily.    . Calcium Carb-Cholecalciferol (OYSTER SHELL CALCIUM + D PO) Take by mouth.    . Cholecalciferol (VITAMIN D-3 PO) Take 2,000 Units by mouth daily.    Marland Kitchen docusate sodium (COLACE) 100 MG capsule Take 100 mg by mouth 2 (two) times daily.    Marland Kitchen ESCITALOPRAM OXALATE PO Take 10 mg by mouth.    . hydrochlorothiazide (MICROZIDE) 12.5 MG capsule Take 12.5 mg by mouth daily.    Marland Kitchen letrozole (FEMARA) 2.5 MG tablet TAKE 1 TABLET EVERY DAY 90 tablet 0  . omeprazole (PRILOSEC OTC) 20 MG tablet Take 40 mg by mouth daily.     . potassium chloride SA (K-DUR,KLOR-CON) 10 MEQ tablet Take 1 tablet (10 mEq total)  by mouth 2 (two) times daily. For three days 6 tablet 0  . traZODone (DESYREL) 50 MG tablet Take 50 mg by mouth at bedtime.    Marland Kitchen warfarin (COUMADIN) 2 MG tablet Take 2 mg by mouth daily.     No current facility-administered medications for this visit.   Facility-Administered Medications Ordered in Other Visits  Medication Dose Route Frequency Provider Last Rate Last Dose  . sodium chloride 0.9 % injection 10 mL  10 mL Intravenous PRN Evlyn Kanner, NP   10 mL at 11/26/15 1042    Review of Systems Review of Systems  Constitutional: Negative.   Respiratory: Negative.   Cardiovascular: Negative.     Blood pressure 168/94, pulse 84, resp. rate 16, height 5\' 4"  (1.626 m), weight 235 lb (106.595 kg).  Physical Exam Physical Exam  Pulmonary/Chest:      Data Reviewed Consent for port removal  review.  Assessment    Completion of adjuvant chemotherapy.    Plan    The port removal procedure was reviewed. The area was cleansed with alcohol and 10 mL of 0.5% Xylocaine with 0.25% Marcaine with 1-200,000 of epinephrine was utilized. This was supplemented with 2 mL 1% plain Xylocaine. ChloraPrep was applied to the skin. The original incision was opened. Fixation sutures were removed without incident. The port was extracted with an intact tip. No bleeding. The deep tissue was approximated with a running 3-0 Vicryl suture. The skin was closed with a running 3-0 Vicryl subcuticular suture. Benzoin, Steri-Strips, Telfa and Tegaderm dressing applied. Ice pack provided. Post procedure instructions reviewed.  The patient will return in one week for nursing assessment.    PCP:  Wynn Banker 12/11/2015, 7:45 PM

## 2015-12-11 NOTE — Patient Instructions (Signed)
Return in one week nurse 

## 2015-12-13 ENCOUNTER — Telehealth: Payer: Self-pay

## 2015-12-13 DIAGNOSIS — I480 Paroxysmal atrial fibrillation: Secondary | ICD-10-CM | POA: Diagnosis not present

## 2015-12-13 NOTE — Telephone Encounter (Signed)
Patient called with concerns about her Tegaderm dressing. She states she has some redness around the area and mild itching. Patient instructed to remove the Tegaderm dressing due to possible reaction to the tape, but to leave the steri strips in place. She could shower but to pat dry the area and not rub. Patient advised to call back with any further concerns and we would see her on 12/18/15 for her follow up.

## 2015-12-18 ENCOUNTER — Ambulatory Visit (INDEPENDENT_AMBULATORY_CARE_PROVIDER_SITE_OTHER): Payer: Commercial Managed Care - HMO | Admitting: *Deleted

## 2015-12-18 DIAGNOSIS — C50119 Malignant neoplasm of central portion of unspecified female breast: Secondary | ICD-10-CM

## 2015-12-18 NOTE — Progress Notes (Signed)
Patient came in today for a wound check port removal.  The wound is clean, with no signs of infection noted. Follow up as scheduled. 

## 2015-12-21 ENCOUNTER — Ambulatory Visit (INDEPENDENT_AMBULATORY_CARE_PROVIDER_SITE_OTHER): Payer: Commercial Managed Care - HMO | Admitting: Family Medicine

## 2015-12-21 ENCOUNTER — Encounter: Payer: Self-pay | Admitting: Family Medicine

## 2015-12-21 VITALS — BP 138/88 | HR 80 | Temp 98.2°F | Resp 16 | Wt 235.0 lb

## 2015-12-21 DIAGNOSIS — E876 Hypokalemia: Secondary | ICD-10-CM

## 2015-12-21 MED ORDER — POTASSIUM CHLORIDE CRYS ER 20 MEQ PO TBCR: 10.0000 meq | EXTENDED_RELEASE_TABLET | Freq: Every day | ORAL | Status: DC

## 2015-12-21 NOTE — Progress Notes (Signed)
Patient ID: Erin Romero, female   DOB: 03/07/1951, 64 y.o.   MRN: [id number]       Patient: Erin Romero Female    DOB: August 03, 1951   64 y.o.   MRN: [id number] Visit Date: 12/21/2015  Today's Provider: Margarita Rana, MD   Chief Complaint  Patient presents with  . hyperkalemia    Patient is here to discuss lab results from the cancer center.    Subjective:    HPI Hypokalemia  Patient is here to discuss low potassium levels that were drawn from the cancer center. Patient reports that she has not been taking potassium supplements for about 5 days.  Mood doing ok after death of husband. Misses him every day.  Tearful when talking about him. Did make it thru the holidays.    Allergies  Allergen Reactions  . Taxotere [Docetaxel] Itching and Rash  . Sulfa Antibiotics Hives   Previous Medications   ACETAMINOPHEN (TYLENOL) 500 MG TABLET    Take 500-1,000 mg by mouth every 4 (four) hours as needed for pain (q 4-6 prn).   AMLODIPINE (NORVASC) 5 MG TABLET    Take 2.5 mg by mouth daily.    ASCORBIC ACID (VITAMIN C) 1000 MG TABLET    Take 1,000 mg by mouth daily.   ASPIRIN 81 MG CHEWABLE TABLET    Chew 81 mg by mouth daily.   BISOPROLOL-HYDROCHLOROTHIAZIDE (ZIAC) 10-6.25 MG PER TABLET    Take 1 tablet by mouth daily.   CALCIUM CARB-CHOLECALCIFEROL (OYSTER SHELL CALCIUM + D PO)    Take by mouth.   CHOLECALCIFEROL (VITAMIN D-3 PO)    Take 2,000 Units by mouth daily.   DOCUSATE SODIUM (COLACE) 100 MG CAPSULE    Take 100 mg by mouth 2 (two) times daily.   ESCITALOPRAM OXALATE PO    Take 10 mg by mouth.   HYDROCHLOROTHIAZIDE (MICROZIDE) 12.5 MG CAPSULE    Take 12.5 mg by mouth daily.   LETROZOLE (FEMARA) 2.5 MG TABLET    TAKE 1 TABLET EVERY DAY   OMEPRAZOLE (PRILOSEC OTC) 20 MG TABLET    Take 40 mg by mouth daily.    POTASSIUM CHLORIDE SA (K-DUR,KLOR-CON) 10 MEQ TABLET    Take 1 tablet (10 mEq total) by mouth 2 (two) times daily. For three days   TRAZODONE (DESYREL) 50 MG TABLET     Take 50 mg by mouth at bedtime.   WARFARIN (COUMADIN) 2 MG TABLET    Take 2 mg by mouth daily.    Review of Systems  Social History  Substance Use Topics  . Smoking status: Never Smoker   . Smokeless tobacco: Never Used  . Alcohol Use: No   Objective:   BP 138/88 mmHg  Pulse 80  Temp(Src) 98.2 F (36.8 C)  Resp 16  Wt 235 lb (106.595 kg)  Physical Exam  Constitutional: She is oriented to person, place, and time. She appears well-developed and well-nourished.  Cardiovascular: Normal rate, regular rhythm, normal heart sounds and intact distal pulses.   Pulmonary/Chest: Effort normal and breath sounds normal.  Neurological: She is alert and oriented to person, place, and time. She has normal reflexes.  Skin: Skin is warm and dry.  Psychiatric: Her behavior is normal. Judgment and thought content normal.      Assessment & Plan:     1. Hypokalemia Start potassium as below. Will also recheck potassium levels. F/U pending results. - potassium chloride (K-DUR,KLOR-CON) 20 MEQ tablet; Take 0.5 tablets (10 mEq total) by mouth  daily. For three days  Dispense: 30 tablet; Refill: 0 - Comprehensive metabolic panel    Patient was seen and examined by Jerrell Belfast, MD, and scribed by Wilburt Finlay, [place]. I have reviewed the document for accuracy and completeness and I agree with above. - Jerrell Belfast, MD    Margarita Rana, MD  [facility]

## 2015-12-22 LAB — COMPREHENSIVE METABOLIC PANEL
ALT: 17 IU/L (ref 0–32)
AST: 20 IU/L (ref 0–40)
Albumin/Globulin Ratio: 1.4 (ref 1.1–2.5)
Albumin: 4.3 g/dL (ref 3.6–4.8)
Alkaline Phosphatase: 78 IU/L (ref 39–117)
BUN/Creatinine Ratio: 17 (ref 11–26)
BUN: 12 mg/dL (ref 8–27)
Bilirubin Total: 0.4 mg/dL (ref 0.0–1.2)
CO2: 22 mmol/L (ref 18–29)
Calcium: 9.6 mg/dL (ref 8.7–10.3)
Chloride: 99 mmol/L (ref 96–106)
Creatinine, Ser: 0.71 mg/dL (ref 0.57–1.00)
GFR calc Af Amer: 103 mL/min/{1.73_m2} (ref >59)
GFR calc non Af Amer: 90 mL/min/{1.73_m2} (ref >59)
Globulin, Total: 3.1 g/dL (ref 1.5–4.5)
Glucose: 108 mg/dL — ABNORMAL HIGH (ref 65–99)
Potassium: 3.6 mmol/L (ref 3.5–5.2)
Sodium: 142 mmol/L (ref 134–144)
Total Protein: 7.4 g/dL (ref 6.0–8.5)

## 2015-12-25 ENCOUNTER — Telehealth: Payer: Self-pay

## 2015-12-25 DIAGNOSIS — E871 Hypo-osmolality and hyponatremia: Secondary | ICD-10-CM

## 2015-12-25 NOTE — Telephone Encounter (Signed)
Pt advised as directed below, I put the lab sheet at the front desk.   Thanks,   -Mickel Baas

## 2015-12-25 NOTE — Telephone Encounter (Signed)
-----   Message from Margarita Rana, MD sent at 12/22/2015  8:06 AM EST ----- Labs stable. Potassium is low normal. Would continue potassium and recheck Thursday this week. Thanks.

## 2015-12-27 DIAGNOSIS — E871 Hypo-osmolality and hyponatremia: Secondary | ICD-10-CM | POA: Diagnosis not present

## 2015-12-28 ENCOUNTER — Telehealth: Payer: Self-pay

## 2015-12-28 LAB — COMPREHENSIVE METABOLIC PANEL
ALT: 14 IU/L (ref 0–32)
AST: 21 IU/L (ref 0–40)
Albumin/Globulin Ratio: 1.3 (ref 1.1–2.5)
Albumin: 4 g/dL (ref 3.6–4.8)
Alkaline Phosphatase: 77 IU/L (ref 39–117)
BUN/Creatinine Ratio: 15 (ref 11–26)
BUN: 11 mg/dL (ref 8–27)
Bilirubin Total: 0.4 mg/dL (ref 0.0–1.2)
CO2: 21 mmol/L (ref 18–29)
Calcium: 9 mg/dL (ref 8.7–10.3)
Chloride: 98 mmol/L (ref 96–106)
Creatinine, Ser: 0.71 mg/dL (ref 0.57–1.00)
GFR calc Af Amer: 103 mL/min/{1.73_m2} (ref >59)
GFR calc non Af Amer: 90 mL/min/{1.73_m2} (ref >59)
Globulin, Total: 3 g/dL (ref 1.5–4.5)
Glucose: 158 mg/dL — ABNORMAL HIGH (ref 65–99)
Potassium: 3.7 mmol/L (ref 3.5–5.2)
Sodium: 139 mmol/L (ref 134–144)
Total Protein: 7 g/dL (ref 6.0–8.5)

## 2015-12-28 NOTE — Telephone Encounter (Signed)
-----   Message from Margarita Rana, MD sent at 12/28/2015  8:47 AM EST ----- Potassium stable. Continue supplement.   Also, blood sugar is up. Please see if can add HgbA1c. Did not see a recent one. Thanks.

## 2015-12-28 NOTE — Telephone Encounter (Signed)
Ok to make ov.  Not emergent. Thanks.

## 2015-12-28 NOTE — Telephone Encounter (Signed)
Patient advised.

## 2015-12-28 NOTE — Telephone Encounter (Signed)
[facility] was not able to add an A1C, do we need her to come in for an office visit.   Thanks,   -Mickel Baas

## 2016-01-03 DIAGNOSIS — I480 Paroxysmal atrial fibrillation: Secondary | ICD-10-CM | POA: Diagnosis not present

## 2016-01-04 ENCOUNTER — Other Ambulatory Visit: Payer: Self-pay | Admitting: Hematology and Oncology

## 2016-01-09 ENCOUNTER — Encounter: Payer: Self-pay | Admitting: Family Medicine

## 2016-01-09 ENCOUNTER — Ambulatory Visit (INDEPENDENT_AMBULATORY_CARE_PROVIDER_SITE_OTHER): Payer: Commercial Managed Care - HMO | Admitting: Family Medicine

## 2016-01-09 VITALS — BP 108/82 | HR 72 | Temp 98.5°F | Resp 16 | Wt 237.0 lb

## 2016-01-09 DIAGNOSIS — R739 Hyperglycemia, unspecified: Secondary | ICD-10-CM

## 2016-01-09 LAB — POCT GLYCOSYLATED HEMOGLOBIN (HGB A1C)
Est. average glucose Bld gHb Est-mCnc: 100
Hemoglobin A1C: 5.1

## 2016-01-09 NOTE — Progress Notes (Signed)
Subjective:    Patient ID: Erin Romero, female    DOB: [birth year], 65 y.o.   MRN: [id number]  Hyperglycemia Episode onset: Last blood glucose checked 12/27/2015 and was 158. The problem has been gradually worsening. Associated symptoms include diaphoresis, fatigue and a visual change. Pertinent negatives include no abdominal pain, chest pain, headaches, numbness or urinary symptoms. She has tried nothing for the symptoms.      Review of Systems  Constitutional: Positive for diaphoresis and fatigue.  Cardiovascular: Negative for chest pain.  Gastrointestinal: Negative for abdominal pain.  Endocrine: Positive for polydipsia. Negative for polyphagia and polyuria.  Neurological: Negative for numbness and headaches.   BP 108/82 mmHg  Pulse 72  Temp(Src) 98.5 F (36.9 C) (Oral)  Resp 16  Wt 237 lb (107.502 kg)   Patient Active Problem List   Diagnosis Date Noted  . Cystitis 07/26/2015  . Adaptation reaction 05/10/2015  . Anxiety 05/10/2015  . Arthritis 05/10/2015  . Esophageal tissue web 05/10/2015  . Genital herpes 05/10/2015  . Bloodgood disease 05/10/2015  . Blood glucose elevated 05/10/2015  . BP (high blood pressure) 05/10/2015  . Cannot sleep 05/10/2015  . Headache, migraine 05/10/2015  . Avitaminosis D 05/10/2015  . Rash and nonspecific skin eruption 04/04/2015  . Clinical depression 10/02/2014  . Hypercholesteremia 10/02/2014  . A-fib ([place]) 04/18/2014  . Breast mass, left 02/27/2014  . Breast cancer, right breast ([place]) 09/12/2013  . Lump or mass in breast   . Malignant neoplasm of central portion of female breast [facility])    Past Medical History  Diagnosis Date  . Hypertension 2005  . Atrial fibrillation ([place]) 2014  . Lump or mass in breast 2014    right mastectomy  . Fibrocystic breast disease 2005  . Malignant neoplasm of central portion of female breast ([place]) January 24, 2013    stage II,T2, N1a. Mastectomy with sentinel node biopsy. 2 sentinel nodes  negative, non-sentinel node w/ 5 mm macrometastatic foci in non-sentinel node.  Received post mastectomy radiation.   Marland Kitchen Anxiety   . GERD (gastroesophageal reflux disease)   . Cancer [facility])     Breast    Current Outpatient Prescriptions on File Prior to Visit  Medication Sig  . acetaminophen (TYLENOL) 500 MG tablet Take 500-1,000 mg by mouth every 4 (four) hours as needed for pain (q 4-6 prn).  Marland Kitchen amLODipine (NORVASC) 5 MG tablet Take 2.5 mg by mouth daily.   . Ascorbic Acid (VITAMIN C) 1000 MG tablet Take 1,000 mg by mouth daily.  Marland Kitchen aspirin 81 MG chewable tablet Chew 81 mg by mouth daily.  . bisoprolol-hydrochlorothiazide (ZIAC) 10-6.25 MG per tablet Take 1 tablet by mouth daily.  . Calcium Carb-Cholecalciferol (OYSTER SHELL CALCIUM + D PO) Take by mouth.  . Cholecalciferol (VITAMIN D-3 PO) Take 2,000 Units by mouth daily.  Marland Kitchen docusate sodium (COLACE) 100 MG capsule Take 100 mg by mouth 2 (two) times daily.  Marland Kitchen ESCITALOPRAM OXALATE PO Take 10 mg by mouth.  . hydrochlorothiazide (MICROZIDE) 12.5 MG capsule Take 12.5 mg by mouth daily.  Marland Kitchen letrozole (FEMARA) 2.5 MG tablet TAKE 1 TABLET EVERY DAY  . omeprazole (PRILOSEC OTC) 20 MG tablet Take 40 mg by mouth daily.   . potassium chloride (K-DUR,KLOR-CON) 20 MEQ tablet Take 0.5 tablets (10 mEq total) by mouth daily. For three days  . traZODone (DESYREL) 50 MG tablet Take 50 mg by mouth at bedtime.  Marland Kitchen warfarin (COUMADIN) 2 MG tablet Take 2 mg by mouth daily.  Current Facility-Administered Medications on File Prior to Visit  Medication  . sodium chloride 0.9 % injection 10 mL   Allergies  Allergen Reactions  . Taxotere [Docetaxel] Itching and Rash  . Sulfa Antibiotics Hives   Past Surgical History  Procedure Laterality Date  . Breast surgery Right 2005,2014    biopsy  . Breast surgery Right 2014    mastectomy  . Knee surgery Left 2005  . Abdominal hysterectomy  1999  . Thumb surg Right 2004  . Tonsillectomy      age of 30  .  Colonoscopy  2002  . Breast biopsy Left 2015    Negative  . Mastectomy Right 2014    radiation chemo  . Colonoscopy with propofol N/A 10/16/2015    Procedure: COLONOSCOPY WITH PROPOFOL;  Surgeon: Robert Bellow, MD;  Location: [facility] ENDOSCOPY;  Service: Endoscopy;  Laterality: N/A;   Social History   Social History  . Marital Status: Widowed    Spouse Name: Atalya Hawbaker  . Number of Children: 0  . Years of Education: 13   Occupational History  .  Unemployed    disabilty from breast cancer   Social History Main Topics  . Smoking status: Never Smoker   . Smokeless tobacco: Never Used  . Alcohol Use: No  . Drug Use: No  . Sexual Activity: Not on file   Other Topics Concern  . Not on file   Social History Narrative   Family History  Problem Relation Age of Onset  . Cancer Other     ovarian,liver,colon cancer-no family member listed  . Heart disease Father   . Osteoporosis Paternal Aunt   . Osteoporosis Paternal Grandmother   . Hypertension Sister   . Cancer Mother     cervical      Objective:   Physical Exam  Constitutional: She appears well-developed and well-nourished.  Psychiatric: She has a normal mood and affect. Her behavior is normal.     Assessment & Plan:  1. Blood glucose elevated Stable. Encouraged pt to work on diet/exercise. - POCT glycosylated hemoglobin (Hb A1C)  Results for orders placed or performed in visit on 01/09/16  POCT glycosylated hemoglobin (Hb A1C)  Result Value Ref Range   Hemoglobin A1C 5.1    Est. average glucose Bld gHb Est-mCnc 100     Patient seen and examined by Jerrell Belfast, MD, and note scribed by Renaldo Fiddler, CMA.  I have reviewed the document for accuracy and completeness and I agree with above. Jerrell Belfast, MD   Margarita Rana, MD

## 2016-01-10 DIAGNOSIS — H5210 Myopia, unspecified eye: Secondary | ICD-10-CM | POA: Diagnosis not present

## 2016-01-10 DIAGNOSIS — H524 Presbyopia: Secondary | ICD-10-CM | POA: Diagnosis not present

## 2016-01-30 ENCOUNTER — Encounter: Payer: Self-pay | Admitting: Hematology and Oncology

## 2016-01-30 DIAGNOSIS — I480 Paroxysmal atrial fibrillation: Secondary | ICD-10-CM | POA: Diagnosis not present

## 2016-01-31 ENCOUNTER — Other Ambulatory Visit: Payer: Self-pay | Admitting: Family Medicine

## 2016-01-31 DIAGNOSIS — I10 Essential (primary) hypertension: Secondary | ICD-10-CM

## 2016-01-31 DIAGNOSIS — E876 Hypokalemia: Secondary | ICD-10-CM

## 2016-01-31 NOTE — Telephone Encounter (Signed)
Pt contacted office for refill request on the following medications:  potassium chloride (K-DUR,KLOR-CON) 20 MEQ tablet.  90 day supply.  [employer] mail order.  CB#[phone]/MW  Pt is asking what dosage she should continue taking?

## 2016-01-31 NOTE — Telephone Encounter (Signed)
Please clarify if she is taking one a day. Thanks.

## 2016-02-04 DIAGNOSIS — E876 Hypokalemia: Secondary | ICD-10-CM | POA: Insufficient documentation

## 2016-02-04 MED ORDER — POTASSIUM CHLORIDE CRYS ER 20 MEQ PO TBCR: 20.0000 meq | EXTENDED_RELEASE_TABLET | Freq: Once | ORAL | Status: DC

## 2016-02-08 ENCOUNTER — Other Ambulatory Visit: Payer: Self-pay

## 2016-02-08 DIAGNOSIS — E876 Hypokalemia: Secondary | ICD-10-CM

## 2016-02-08 MED ORDER — POTASSIUM CHLORIDE CRYS ER 20 MEQ PO TBCR: 20.0000 meq | EXTENDED_RELEASE_TABLET | Freq: Once | ORAL | Status: DC

## 2016-02-11 ENCOUNTER — Other Ambulatory Visit: Payer: Self-pay

## 2016-02-11 DIAGNOSIS — E876 Hypokalemia: Secondary | ICD-10-CM

## 2016-02-11 MED ORDER — POTASSIUM CHLORIDE CRYS ER 20 MEQ PO TBCR: 20.0000 meq | EXTENDED_RELEASE_TABLET | Freq: Every day | ORAL | Status: DC

## 2016-02-27 DIAGNOSIS — I480 Paroxysmal atrial fibrillation: Secondary | ICD-10-CM | POA: Diagnosis not present

## 2016-03-03 ENCOUNTER — Other Ambulatory Visit: Payer: Self-pay | Admitting: Hematology and Oncology

## 2016-03-19 DIAGNOSIS — I480 Paroxysmal atrial fibrillation: Secondary | ICD-10-CM | POA: Diagnosis not present

## 2016-04-10 ENCOUNTER — Telehealth: Payer: Self-pay | Admitting: Family Medicine

## 2016-04-10 DIAGNOSIS — H40003 Preglaucoma, unspecified, bilateral: Secondary | ICD-10-CM | POA: Diagnosis not present

## 2016-04-10 NOTE — Telephone Encounter (Signed)
Pt has [employer] and sees Dr. Ubaldo Glassing for her A Fib. Pt stated she has an appt with Dr. Ubaldo Glassing on Monday 04/14/16 and wanted to make sure she had a referral if needed. PT stated this is an on going referral. Thanks TNP

## 2016-04-14 DIAGNOSIS — I10 Essential (primary) hypertension: Secondary | ICD-10-CM | POA: Diagnosis not present

## 2016-04-14 DIAGNOSIS — E7800 Pure hypercholesterolemia, unspecified: Secondary | ICD-10-CM | POA: Diagnosis not present

## 2016-04-14 DIAGNOSIS — I4891 Unspecified atrial fibrillation: Secondary | ICD-10-CM | POA: Diagnosis not present

## 2016-04-14 DIAGNOSIS — I481 Persistent atrial fibrillation: Secondary | ICD-10-CM | POA: Diagnosis not present

## 2016-04-14 DIAGNOSIS — Z6841 Body Mass Index (BMI) 40.0 and over, adult: Secondary | ICD-10-CM | POA: Diagnosis not present

## 2016-04-16 DIAGNOSIS — I480 Paroxysmal atrial fibrillation: Secondary | ICD-10-CM | POA: Diagnosis not present

## 2016-05-13 ENCOUNTER — Other Ambulatory Visit: Payer: Self-pay | Admitting: Hematology and Oncology

## 2016-05-14 DIAGNOSIS — I480 Paroxysmal atrial fibrillation: Secondary | ICD-10-CM | POA: Diagnosis not present

## 2016-05-15 ENCOUNTER — Other Ambulatory Visit: Payer: Self-pay | Admitting: Family Medicine

## 2016-05-15 DIAGNOSIS — G4700 Insomnia, unspecified: Secondary | ICD-10-CM

## 2016-05-15 DIAGNOSIS — I10 Essential (primary) hypertension: Secondary | ICD-10-CM

## 2016-05-26 ENCOUNTER — Inpatient Hospital Stay: Payer: Commercial Managed Care - HMO | Attending: Hematology and Oncology

## 2016-05-26 ENCOUNTER — Inpatient Hospital Stay (HOSPITAL_BASED_OUTPATIENT_CLINIC_OR_DEPARTMENT_OTHER): Payer: Commercial Managed Care - HMO | Admitting: Hematology and Oncology

## 2016-05-26 VITALS — BP 171/109 | HR 80 | Temp 97.8°F | Resp 18 | Wt 237.4 lb

## 2016-05-26 DIAGNOSIS — K219 Gastro-esophageal reflux disease without esophagitis: Secondary | ICD-10-CM | POA: Insufficient documentation

## 2016-05-26 DIAGNOSIS — Z800 Family history of malignant neoplasm of digestive organs: Secondary | ICD-10-CM | POA: Diagnosis not present

## 2016-05-26 DIAGNOSIS — C50911 Malignant neoplasm of unspecified site of right female breast: Secondary | ICD-10-CM

## 2016-05-26 DIAGNOSIS — Z7901 Long term (current) use of anticoagulants: Secondary | ICD-10-CM

## 2016-05-26 DIAGNOSIS — Z7982 Long term (current) use of aspirin: Secondary | ICD-10-CM

## 2016-05-26 DIAGNOSIS — R21 Rash and other nonspecific skin eruption: Secondary | ICD-10-CM

## 2016-05-26 DIAGNOSIS — C50111 Malignant neoplasm of central portion of right female breast: Secondary | ICD-10-CM

## 2016-05-26 DIAGNOSIS — M8580 Other specified disorders of bone density and structure, unspecified site: Secondary | ICD-10-CM | POA: Diagnosis not present

## 2016-05-26 DIAGNOSIS — I10 Essential (primary) hypertension: Secondary | ICD-10-CM | POA: Diagnosis not present

## 2016-05-26 DIAGNOSIS — Z170 Estrogen receptor positive status [ER+]: Secondary | ICD-10-CM | POA: Diagnosis not present

## 2016-05-26 DIAGNOSIS — Z9221 Personal history of antineoplastic chemotherapy: Secondary | ICD-10-CM | POA: Insufficient documentation

## 2016-05-26 DIAGNOSIS — Z808 Family history of malignant neoplasm of other organs or systems: Secondary | ICD-10-CM

## 2016-05-26 DIAGNOSIS — F419 Anxiety disorder, unspecified: Secondary | ICD-10-CM

## 2016-05-26 DIAGNOSIS — B379 Candidiasis, unspecified: Secondary | ICD-10-CM

## 2016-05-26 DIAGNOSIS — Z923 Personal history of irradiation: Secondary | ICD-10-CM | POA: Diagnosis not present

## 2016-05-26 DIAGNOSIS — Z9011 Acquired absence of right breast and nipple: Secondary | ICD-10-CM | POA: Insufficient documentation

## 2016-05-26 DIAGNOSIS — Z79899 Other long term (current) drug therapy: Secondary | ICD-10-CM

## 2016-05-26 DIAGNOSIS — I4891 Unspecified atrial fibrillation: Secondary | ICD-10-CM

## 2016-05-26 DIAGNOSIS — Z8041 Family history of malignant neoplasm of ovary: Secondary | ICD-10-CM | POA: Insufficient documentation

## 2016-05-26 LAB — CBC WITH DIFFERENTIAL/PLATELET
Basophils Absolute: 0.1 10*3/uL (ref 0–0.1)
Basophils Relative: 1 %
Eosinophils Absolute: 0.1 10*3/uL (ref 0–0.7)
Eosinophils Relative: 2 %
HCT: 38.5 % (ref 35.0–47.0)
Hemoglobin: 13.3 g/dL (ref 12.0–16.0)
Lymphocytes Relative: 20 %
Lymphs Abs: 1.7 10*3/uL (ref 1.0–3.6)
MCH: 29.9 pg (ref 26.0–34.0)
MCHC: 34.6 g/dL (ref 32.0–36.0)
MCV: 86.6 fL (ref 80.0–100.0)
Monocytes Absolute: 0.6 10*3/uL (ref 0.2–0.9)
Monocytes Relative: 7 %
Neutro Abs: 5.7 10*3/uL (ref 1.4–6.5)
Neutrophils Relative %: 70 %
Platelets: 188 10*3/uL (ref 150–440)
RBC: 4.44 MIL/uL (ref 3.80–5.20)
RDW: 16.1 % — ABNORMAL HIGH (ref 11.5–14.5)
WBC: 8.2 10*3/uL (ref 3.6–11.0)

## 2016-05-26 LAB — COMPREHENSIVE METABOLIC PANEL
ALT: 18 U/L (ref 14–54)
AST: 26 U/L (ref 15–41)
Albumin: 4 g/dL (ref 3.5–5.0)
Alkaline Phosphatase: 72 U/L (ref 38–126)
Anion gap: 7 (ref 5–15)
BUN: 15 mg/dL (ref 6–20)
CO2: 26 mmol/L (ref 22–32)
Calcium: 9.1 mg/dL (ref 8.9–10.3)
Chloride: 104 mmol/L (ref 101–111)
Creatinine, Ser: 0.78 mg/dL (ref 0.44–1.00)
GFR calc Af Amer: >60 mL/min (ref >60)
GFR calc non Af Amer: >60 mL/min (ref >60)
Glucose, Bld: 134 mg/dL — ABNORMAL HIGH (ref 65–99)
Potassium: 3.5 mmol/L (ref 3.5–5.1)
Sodium: 137 mmol/L (ref 135–145)
Total Bilirubin: 0.6 mg/dL (ref 0.3–1.2)
Total Protein: 7.8 g/dL (ref 6.5–8.1)

## 2016-05-26 MED ORDER — NYSTATIN POWD: 1.0000 | Freq: Two times a day (BID) | Status: DC | PRN

## 2016-05-26 NOTE — Progress Notes (Signed)
[facility] day:  05/26/2016   Chief Complaint: Erin Romero is a 65 y.o. female with stage IIB right breast cancer who is seen for 6 month assessment.  HPI: The patient was last seen in the medical oncology clinic on 11/26/2015.  At that time, she denied any concerns.  Exam revealed a fungal rash beneath her breasts.  She was prescribed Nystatin powder topically.  Mammogram and bone density study were reviewed.  Calcium and vitamin D supplementation were reviewed for her osteopenia.  Port removal was discussed.  CBC with diff, CMP, and CA27.29 (26.3) were normal.  During the interim, she has done well.  She had her port-a-cath removed.  She states that her blood pressure is typically very good.  She has been off her amlodipine since 03/2016.  She is upset today as her husband, Erin Romero, died 1 year ago (05-29-15).   Past Medical History  Diagnosis Date  . Hypertension 2005  . Atrial fibrillation ([place]) 2014  . Lump or mass in breast 2014    right mastectomy  . Fibrocystic breast disease 2005  . Malignant neoplasm of central portion of female breast ([place]) January 24, 2013    stage II,T2, N1a. Mastectomy with sentinel node biopsy. 2 sentinel nodes negative, non-sentinel node w/ 5 mm macrometastatic foci in non-sentinel node.  Received post mastectomy radiation.   Marland Kitchen Anxiety   . GERD (gastroesophageal reflux disease)   . Cancer [facility])     Breast     Past Surgical History  Procedure Laterality Date  . Breast surgery Right 2005,2014    biopsy  . Breast surgery Right 2014    mastectomy  . Knee surgery Left 2005  . Abdominal hysterectomy  1999  . Thumb surg Right 2004  . Tonsillectomy      age of 62  . Colonoscopy  2002  . Breast biopsy Left 2015    Negative  . Mastectomy Right 2014    radiation chemo  . Colonoscopy with propofol N/A 10/16/2015    Procedure: COLONOSCOPY WITH PROPOFOL;  Surgeon: Robert Bellow, MD;  Location: [facility]  ENDOSCOPY;  Service: Endoscopy;  Laterality: N/A;    Family History  Problem Relation Age of Onset  . Cancer Other     ovarian,liver,colon cancer-no family member listed  . Heart disease Father   . Osteoporosis Paternal Aunt   . Osteoporosis Paternal Grandmother   . Hypertension Sister   . Cancer Mother     cervical    Social History:  reports that she has never smoked. She has never used smokeless tobacco. She reports that she does not drink alcohol or use illicit drugs.  Her husband, Erin Romero, died 05/29/15. The patient is alone today.  Allergies:  Allergies  Allergen Reactions  . Taxotere [Docetaxel] Itching and Rash  . Sulfa Antibiotics Hives    Current Medications: Current Outpatient Prescriptions  Medication Sig Dispense Refill  . acetaminophen (TYLENOL) 500 MG tablet Take 500-1,000 mg by mouth every 4 (four) hours as needed for pain (q 4-6 prn).    . Ascorbic Acid (VITAMIN C) 1000 MG tablet Take 500 mg by mouth daily.     Marland Kitchen aspirin 81 MG chewable tablet Chew 81 mg by mouth daily.    . bisoprolol-hydrochlorothiazide (ZIAC) 10-6.25 MG tablet TAKE 1 TABLET EVERY DAY 90 tablet 3  . Calcium Carb-Cholecalciferol (OYSTER SHELL CALCIUM + D PO) Take 1 tablet by mouth 2 (two) times daily.     Marland Kitchen  Cholecalciferol (VITAMIN D-3 PO) Take 1,000 Units by mouth daily.     Marland Kitchen docusate sodium (COLACE) 100 MG capsule Take 100 mg by mouth 2 (two) times daily.    Marland Kitchen ESCITALOPRAM OXALATE PO Take 5 mg by mouth.     . hydrochlorothiazide (MICROZIDE) 12.5 MG capsule Take 12.5 mg by mouth daily.    Marland Kitchen letrozole (FEMARA) 2.5 MG tablet TAKE 1 TABLET EVERY DAY 90 tablet 0  . lisinopril (PRINIVIL,ZESTRIL) 20 MG tablet Take 20 mg by mouth daily.    Marland Kitchen omeprazole (PRILOSEC OTC) 20 MG tablet Take 20 mg by mouth daily.     . potassium chloride SA (K-DUR,KLOR-CON) 20 MEQ tablet Take 1 tablet (20 mEq total) by mouth daily. 90 tablet 1  . traZODone (DESYREL) 50 MG tablet TAKE 1 TABLET EVERY DAY 90 tablet 3  .  warfarin (COUMADIN) 2 MG tablet Take 2 mg by mouth daily.     No current facility-administered medications for this visit.    Review of Systems:  GENERAL:  Feels "ok".  No fevers or sweats.  Weight up 2 pounds. PERFORMANCE STATUS (ECOG):  1 HEENT:  No visual changes, runny nose, sore throat, mouth sores or tenderness. Lungs: No shortness of breath or cough.  No hemoptysis. Cardiac:  No chest pain, palpitations, orthopnea, or PND. GI:  No nausea, vomiting, diarrhea, constipation, melena or hematochezia. GU:  No urgency, frequency, dysuria, or hematuria. Musculoskeletal:  No back pain.  No joint pain.  No muscle tenderness. Extremities:  No pain or swelling. Skin:  Slight yeast infection under left breast.  Neuro:  No headache, numbness or weakness, balance or coordination issues. Endocrine:  Vasomoter symptoms.  No diabetes, thyroid issues, hot flashes or night sweats. Psych:  Emotional with anniversary of husband's death.  No anxiety. Pain:  No focal pain. Review of systems:  All other systems reviewed and found to be negative.  Physical Exam: Blood pressure 171/109, pulse 80, temperature 97.8 F (36.6 C), temperature source Tympanic, resp. rate 18, weight 237 lb 7 oz (107.7 kg). GENERAL:  Well developed, well nourished, sitting comfortably in the exam room in no acute distress.  She is tearful at times. MENTAL STATUS:  Alert and oriented to person, place and time. HEAD:  Short brown hair with graying.  Normocephalic, atraumatic, face symmetric, no Cushingoid features. EYES:  Glasses.  Dark hazel eyes.  Pupils equal round and reactive to light and accomodation.  No conjunctivitis or scleral icterus. ENT:  Oropharynx clear without lesion.  Tongue normal. Mucous membranes moist.  RESPIRATORY:  Clear to auscultation without rales, wheezes or rhonchi. CARDIOVASCULAR:  Regular rate and rhythm without murmur, rub or gallop. BREAST:  Right mastectomy.  No erythema or nodularity.  Left breast  without masses, skin changes or nipple discharge.  ABDOMEN:  Soft, non-tender, with active bowel sounds, and no appreciable  hepatosplenomegaly.  No masses. SKIN:  Mild fungal rash beneath left breast.  EXTREMITIES: No edema, no skin discoloration or tenderness.  No palpable cords. LYMPH NODES: No palpable cervical, supraclavicular, axillary or inguinal adenopathy  NEUROLOGICAL: Unremarkable. PSYCH:  Appropriate.  Appointment on 05/26/2016  Component Date Value Ref Range Status  . WBC 05/26/2016 8.2  3.6 - 11.0 K/uL Final  . RBC 05/26/2016 4.44  3.80 - 5.20 MIL/uL Final  . Hemoglobin 05/26/2016 13.3  12.0 - 16.0 g/dL Final  . HCT 89/53/6153 38.5  35.0 - 47.0 % Final  . MCV 05/26/2016 86.6  80.0 - 100.0 fL Final  . MCH  05/26/2016 29.9  26.0 - 34.0 pg Final  . MCHC 05/26/2016 34.6  32.0 - 36.0 g/dL Final  . RDW 05/26/2016 16.1* 11.5 - 14.5 % Final  . Platelets 05/26/2016 188  150 - 440 K/uL Final  . Neutrophils Relative % 05/26/2016 70   Final  . Neutro Abs 05/26/2016 5.7  1.4 - 6.5 K/uL Final  . Lymphocytes Relative 05/26/2016 20   Final  . Lymphs Abs 05/26/2016 1.7  1.0 - 3.6 K/uL Final  . Monocytes Relative 05/26/2016 7   Final  . Monocytes Absolute 05/26/2016 0.6  0.2 - 0.9 K/uL Final  . Eosinophils Relative 05/26/2016 2   Final  . Eosinophils Absolute 05/26/2016 0.1  0 - 0.7 K/uL Final  . Basophils Relative 05/26/2016 1   Final  . Basophils Absolute 05/26/2016 0.1  0 - 0.1 K/uL Final  . Sodium 05/26/2016 137  135 - 145 mmol/L Final  . Potassium 05/26/2016 3.5  3.5 - 5.1 mmol/L Final  . Chloride 05/26/2016 104  101 - 111 mmol/L Final  . CO2 05/26/2016 26  22 - 32 mmol/L Final  . Glucose, Bld 05/26/2016 134* 65 - 99 mg/dL Final  . BUN 05/26/2016 15  6 - 20 mg/dL Final  . Creatinine, Ser 05/26/2016 0.78  0.44 - 1.00 mg/dL Final  . Calcium 05/26/2016 9.1  8.9 - 10.3 mg/dL Final  . Total Protein 05/26/2016 7.8  6.5 - 8.1 g/dL Final  . Albumin 05/26/2016 4.0  3.5 - 5.0 g/dL Final   . AST 05/26/2016 26  15 - 41 U/L Final  . ALT 05/26/2016 18  14 - 54 U/L Final  . Alkaline Phosphatase 05/26/2016 72  38 - 126 U/L Final  . Total Bilirubin 05/26/2016 0.6  0.3 - 1.2 mg/dL Final  . GFR calc non Af Amer 05/26/2016 >60  >60 mL/min Final  . GFR calc Af Amer 05/26/2016 >60  >60 mL/min Final   Comment: (NOTE) The eGFR has been calculated using the CKD EPI equation. This calculation has not been validated in all clinical situations. eGFR's persistently <60 mL/min signify possible Chronic Kidney Disease.   Georgiann Hahn gap 05/26/2016 7  5 - 15 Final    Assessment:  Erin Romero is a 66 y.o. female with a history of stage IIB (T2N1M0) right breast cancer status post radical mastectomy on 01/24/2013. Pathology revealed a 3 cm grade II invasive mammary carcinoma.  Zero of 2 sentinel lymph nodes were positive.  A non-sentinel lymph node was positive for a 0.5 cm macrometastasis with extracapsular extension.  DCIS was present. Estrogen receptor was> 90%, progesterone receptor greater than 90%, and HER-2/neu negative.  She is s/p 3 cycles of Taxotere and Cytoxan (03/08/2013 - 04/20/2013). Treatment was complicated by erythromelalgia and an urticarial reaction. She did not receive any further chemotherapy.  She received radiation from 05/30/2013 until 07/19/2013. She began Femara.  Mammogram on 02/06/2014 was suspicious for left breast mass.  Biopsy on 02/27/2014 was negative for malignancy.  Left-sided mammogram on 09/13/2015 was negative.  CA27.29 was 26.3 on 11/26/2015 and 31.3 on 05/26/2016.  Bone density study on 06/05/2015 revealed a T score of -1.9 (osteopenia) in the left femoral neck.  Patient is on calcium and vitamin D.  Patient declined Prolia and Fosamax.  Symptomatically, she is emotional with the 1 year anniversary of the loss of her husband.  She has a mild fungal infection beneath her left breast.  She has mild vasomotor symptoms.  Blood pressure is elevated (patient  relates to emotional issues and coming to clinic).  Plan: 1.  Labs today:  CBC with diff, CMP, CA27.29. 2.  Continue Femara. 3.  Rx:  Nystatin powder topically. 4.  Anticipate mammogram 09/12/2016 (ordered by Dr. Bary Castilla). 5.  Patient to recheck blood pressure after clinic.  If remains elevated, she is to contact Dr. Venia Minks. 6.  RTC in 6 months for MD assessment and labs (CBC with diff, CMP, CA27.29).   Lequita Asal, MD  05/26/2016 , 11:15 AM

## 2016-05-26 NOTE — Progress Notes (Signed)
States is feeling well. Offers no complaints. Feeling sad today due to remembering husband that passed away over 1 year ago.

## 2016-05-27 LAB — CANCER ANTIGEN 27.29: CA 27.29: 31.3 U/mL (ref 0.0–38.6)

## 2016-05-28 ENCOUNTER — Encounter: Payer: Self-pay | Admitting: Hematology and Oncology

## 2016-06-11 DIAGNOSIS — I480 Paroxysmal atrial fibrillation: Secondary | ICD-10-CM | POA: Diagnosis not present

## 2016-06-28 ENCOUNTER — Other Ambulatory Visit: Payer: Self-pay | Admitting: Family Medicine

## 2016-06-28 DIAGNOSIS — I10 Essential (primary) hypertension: Secondary | ICD-10-CM

## 2016-06-28 DIAGNOSIS — F419 Anxiety disorder, unspecified: Secondary | ICD-10-CM

## 2016-07-09 DIAGNOSIS — I480 Paroxysmal atrial fibrillation: Secondary | ICD-10-CM | POA: Diagnosis not present

## 2016-07-14 ENCOUNTER — Other Ambulatory Visit: Payer: Self-pay | Admitting: Hematology and Oncology

## 2016-07-16 ENCOUNTER — Other Ambulatory Visit: Payer: Self-pay | Admitting: General Surgery

## 2016-07-16 DIAGNOSIS — Z1231 Encounter for screening mammogram for malignant neoplasm of breast: Secondary | ICD-10-CM

## 2016-07-29 ENCOUNTER — Encounter: Payer: Self-pay | Admitting: *Deleted

## 2016-08-06 DIAGNOSIS — I480 Paroxysmal atrial fibrillation: Secondary | ICD-10-CM | POA: Diagnosis not present

## 2016-09-03 DIAGNOSIS — I480 Paroxysmal atrial fibrillation: Secondary | ICD-10-CM | POA: Diagnosis not present

## 2016-09-15 ENCOUNTER — Ambulatory Visit
Admission: RE | Admit: 2016-09-15 | Discharge: 2016-09-15 | Disposition: A | Discharge status: Home / Self Care | Payer: Commercial Managed Care - HMO | Source: Ambulatory Visit | Attending: General Surgery | Admitting: General Surgery

## 2016-09-15 DIAGNOSIS — Z1231 Encounter for screening mammogram for malignant neoplasm of breast: Secondary | ICD-10-CM | POA: Diagnosis not present

## 2016-09-15 HISTORY — DX: Malignant neoplasm of unspecified site of unspecified female breast: C50.919

## 2016-09-16 ENCOUNTER — Encounter: Payer: Self-pay | Admitting: *Deleted

## 2016-09-16 ENCOUNTER — Other Ambulatory Visit: Payer: Self-pay | Admitting: Hematology and Oncology

## 2016-09-17 DIAGNOSIS — I480 Paroxysmal atrial fibrillation: Secondary | ICD-10-CM | POA: Diagnosis not present

## 2016-09-22 ENCOUNTER — Ambulatory Visit (INDEPENDENT_AMBULATORY_CARE_PROVIDER_SITE_OTHER): Payer: Commercial Managed Care - HMO | Admitting: General Surgery

## 2016-09-22 ENCOUNTER — Encounter: Payer: Self-pay | Admitting: General Surgery

## 2016-09-22 VITALS — BP 138/76 | HR 82 | Resp 16 | Ht 64.0 in | Wt 235.0 lb

## 2016-09-22 DIAGNOSIS — C50111 Malignant neoplasm of central portion of right female breast: Secondary | ICD-10-CM | POA: Diagnosis not present

## 2016-09-22 NOTE — Patient Instructions (Addendum)
Patient to follow up in one year with Left screening mammogram. Call with any questions or concerns.

## 2016-09-22 NOTE — Progress Notes (Signed)
Patient ID: Erin Romero, female   DOB: [birth year], 65 y.o.   MRN: [id number]  Chief Complaint  Patient presents with  . Follow-up    mammogram    HPI Erin Romero is a 65 y.o. female who presents for a breast evaluation. The most recent mammogram was done on 09/15/16. Patient does perform regular self breast checks and gets regular mammograms done.  Patient reports no new breast issues, just a stinging sensation in her left breast occurs randomly.    HPI  Past Medical History:  Diagnosis Date  . Anxiety   . Atrial fibrillation ([place]) 2014  . Breast cancer ([place])    right breast  . Cancer ([place])    Breast   . Fibrocystic breast disease 2005  . GERD (gastroesophageal reflux disease)   . Hypertension 2005  . Lump or mass in breast 2014   right mastectomy  . Malignant neoplasm of central portion of female breast ([place]) January 24, 2013   stage II,T2, N1a. Mastectomy with sentinel node biopsy. 2 sentinel nodes negative, non-sentinel node w/ 5 mm macrometastatic foci in non-sentinel node.  Received post mastectomy radiation.     Past Surgical History:  Procedure Laterality Date  . ABDOMINAL HYSTERECTOMY  1999  . BREAST BIOPSY Left 2015   Negative  . BREAST SURGERY Right 2005,2014   biopsy  . BREAST SURGERY Right 2014   mastectomy  . COLONOSCOPY  2002  . COLONOSCOPY WITH PROPOFOL N/A 10/16/2015   Procedure: COLONOSCOPY WITH PROPOFOL;  Surgeon: Robert Bellow, MD;  Location: [facility] ENDOSCOPY;  Service: Endoscopy;  Laterality: N/A;  . KNEE SURGERY Left 2005  . MASTECTOMY Right 2014   radiation chemo  . thumb surg Right 2004  . TONSILLECTOMY     age of 60    Family History  Problem Relation Age of Onset  . Heart disease Father   . Hypertension Sister   . Cancer Mother     cervical  . Cancer Other     ovarian,liver,colon cancer-no family member listed  . Osteoporosis Paternal Aunt   . Osteoporosis Paternal Grandmother     Social History Social History   Substance Use Topics  . Smoking status: Never Smoker  . Smokeless tobacco: Never Used  . Alcohol use No    Allergies  Allergen Reactions  . Taxotere [Docetaxel] Itching and Rash  . Sulfa Antibiotics Hives    Current Outpatient Prescriptions  Medication Sig Dispense Refill  . acetaminophen (TYLENOL) 500 MG tablet Take 500-1,000 mg by mouth every 4 (four) hours as needed for pain (q 4-6 prn).    . Ascorbic Acid (VITAMIN C) 1000 MG tablet Take 500 mg by mouth daily.     Marland Kitchen aspirin 81 MG chewable tablet Chew 81 mg by mouth daily.    . bisoprolol-hydrochlorothiazide (ZIAC) 10-6.25 MG tablet TAKE 1 TABLET EVERY DAY 90 tablet 3  . Calcium Carb-Cholecalciferol (OYSTER SHELL CALCIUM + D PO) Take 1 tablet by mouth 2 (two) times daily.     . Cholecalciferol (VITAMIN D-3 PO) Take 1,000 Units by mouth daily.     Marland Kitchen docusate sodium (COLACE) 100 MG capsule Take 100 mg by mouth 2 (two) times daily.    Marland Kitchen escitalopram (LEXAPRO) 10 MG tablet TAKE 1 TABLET EVERY DAY 90 tablet 1  . FLUZONE HIGH-DOSE 0.5 ML SUSY inject 0.5 milliliter intramuscularly  0  . hydrochlorothiazide (HYDRODIURIL) 12.5 MG tablet TAKE 1 TABLET EVERY DAY 90 tablet 1  . letrozole (FEMARA) 2.5 MG tablet  TAKE 1 TABLET EVERY DAY 90 tablet 0  . lisinopril (PRINIVIL,ZESTRIL) 20 MG tablet Take 20 mg by mouth daily.    Marland Kitchen Nystatin POWD 1 Dose by Does not apply route 2 (two) times daily as needed. 1 Bottle 3  . omeprazole (PRILOSEC OTC) 20 MG tablet Take 20 mg by mouth daily.     Marland Kitchen PNEUMOVAX 23 25 MCG/0.5ML injection inject 0.5 milliliter intramuscularly  0  . potassium chloride SA (K-DUR,KLOR-CON) 20 MEQ tablet Take 1 tablet (20 mEq total) by mouth daily. 90 tablet 1  . traZODone (DESYREL) 50 MG tablet TAKE 1 TABLET EVERY DAY 90 tablet 3  . warfarin (COUMADIN) 2 MG tablet Take 2 mg by mouth daily.     No current facility-administered medications for this visit.     Review of Systems Review of Systems  Constitutional: Negative.    Respiratory: Negative.   Cardiovascular: Negative.     Blood pressure 138/76, pulse 82, resp. rate 16, height 5\' 4"  (1.626 m), weight 235 lb (106.6 kg).  Physical Exam Physical Exam  Constitutional: She is oriented to person, place, and time. She appears well-developed and well-nourished.  Eyes: Conjunctivae are normal. No scleral icterus.  Neck: Neck supple.  Cardiovascular: Normal rate, regular rhythm and normal heart sounds.   Pulmonary/Chest: Effort normal and breath sounds normal. Left breast exhibits no inverted nipple, no mass, no nipple discharge, no skin change and no tenderness.    Some focal areas of telangiectasia noted on the right chest wall.  Musculoskeletal:       Arms: Neurological: She is alert and oriented to person, place, and time.  Skin: Skin is warm and dry.    Data Reviewed Left screening mammogram dated 09/15/2016 was reviewed. Changes related to previous PowerPort removal. BI-RADS-1.  Assessment    Stable breast exam now 3 and half years after mastectomy, postmastectomy radiation and adjuvant chemotherapy.    Plan         Patient to follow up in one year with left screening mammogram. Call with any questions or concerns.    Robert Bellow 09/23/2016, 3:35 PM

## 2016-10-09 DIAGNOSIS — H40013 Open angle with borderline findings, low risk, bilateral: Secondary | ICD-10-CM | POA: Diagnosis not present

## 2016-10-14 DIAGNOSIS — I4891 Unspecified atrial fibrillation: Secondary | ICD-10-CM | POA: Diagnosis not present

## 2016-10-14 DIAGNOSIS — I10 Essential (primary) hypertension: Secondary | ICD-10-CM | POA: Diagnosis not present

## 2016-10-14 DIAGNOSIS — E7800 Pure hypercholesterolemia, unspecified: Secondary | ICD-10-CM | POA: Diagnosis not present

## 2016-10-15 ENCOUNTER — Other Ambulatory Visit: Payer: Self-pay | Admitting: Family Medicine

## 2016-10-15 DIAGNOSIS — E876 Hypokalemia: Secondary | ICD-10-CM

## 2016-11-05 ENCOUNTER — Ambulatory Visit (INDEPENDENT_AMBULATORY_CARE_PROVIDER_SITE_OTHER): Payer: Commercial Managed Care - HMO | Admitting: Physician Assistant

## 2016-11-05 VITALS — BP 144/88 | HR 80 | Temp 98.7°F | Resp 16 | Ht 64.0 in | Wt 238.0 lb

## 2016-11-05 DIAGNOSIS — Z0000 Encounter for general adult medical examination without abnormal findings: Secondary | ICD-10-CM

## 2016-11-05 NOTE — Progress Notes (Signed)
Patient: Erin Romero, Female    DOB: 06/02/1951, 65 y.o.   MRN: [id number] Visit Date: 11/05/2016  Today's Provider: Trinna Post, PA-C   Chief Complaint  Patient presents with  . Medicare Wellness   Subjective:    Annual wellness visit Erin Romero is a 65 y.o. female. She feels fairly well. She reports she is not exercising.   She reports she is sleeping fairly well.  Is starting to sleep better after her husband past away last year.  Lives with mother in law in house with dog. Husband passed away last year of lung cancer.   Reports awful diet and exercise. Reports hard to exercise with knees.  -----------------------------------------------------------   Review of Systems  Constitutional: Negative.   HENT: Positive for tinnitus. Negative for congestion, dental problem, drooling, ear discharge, ear pain, facial swelling, hearing loss, mouth sores, nosebleeds, postnasal drip, rhinorrhea, sinus pain, sinus pressure, sneezing, sore throat, trouble swallowing and voice change.   Eyes: Negative.   Respiratory: Negative.   Cardiovascular: Positive for palpitations. Negative for chest pain and leg swelling.  Gastrointestinal: Negative.   Endocrine: Negative.   Genitourinary: Positive for genital sores. Negative for decreased urine volume, difficulty urinating, dyspareunia, dysuria, enuresis, flank pain, frequency, hematuria, menstrual problem, pelvic pain, urgency, vaginal bleeding, vaginal discharge and vaginal pain.  Musculoskeletal: Negative.   Skin: Negative.   Allergic/Immunologic: Negative.   Neurological: Negative.   Hematological: Negative for adenopathy. Bruises/bleeds easily.  Psychiatric/Behavioral: Negative.     Social History   Social History  . Marital status: Widowed    Spouse name: Erin Romero  . Number of children: 0  . Years of education: 28   Occupational History  .  Unemployed    disabilty from breast cancer   Social  History Main Topics  . Smoking status: Never Smoker  . Smokeless tobacco: Never Used  . Alcohol use No  . Drug use: No  . Sexual activity: Not on file   Other Topics Concern  . Not on file   Social History Narrative  . No narrative on file    Past Medical History:  Diagnosis Date  . Anxiety   . Atrial fibrillation ([place]) 2014  . Breast cancer ([place])    right breast  . Cancer ([place])    Breast   . Fibrocystic breast disease 2005  . GERD (gastroesophageal reflux disease)   . Hypertension 2005  . Lump or mass in breast 2014   right mastectomy  . Malignant neoplasm of central portion of female breast ([place]) January 24, 2013   stage II,T2, N1a. Mastectomy with sentinel node biopsy. 2 sentinel nodes negative, non-sentinel node w/ 5 mm macrometastatic foci in non-sentinel node.  Received post mastectomy radiation.      Patient Active Problem List   Diagnosis Date Noted  . Candida infection 05/26/2016  . Hypokalemia 02/04/2016  . Cystitis 07/26/2015  . Osteopenia 06/05/2015  . Adaptation reaction 05/10/2015  . Anxiety 05/10/2015  . Arthritis 05/10/2015  . Esophageal tissue web 05/10/2015  . Genital herpes 05/10/2015  . Bloodgood disease 05/10/2015  . Blood glucose elevated 05/10/2015  . BP (high blood pressure) 05/10/2015  . Insomnia 05/10/2015  . Headache, migraine 05/10/2015  . Avitaminosis D 05/10/2015  . Rash and nonspecific skin eruption 04/04/2015  . Clinical depression 10/02/2014  . Hypercholesteremia 10/02/2014  . A-fib ([place]) 04/18/2014  . Breast mass, left 02/27/2014  . Breast cancer, right breast ([place]) 09/12/2013  .  Lump or mass in breast   . Malignant neoplasm of central portion of female breast [facility])     Past Surgical History:  Procedure Laterality Date  . ABDOMINAL HYSTERECTOMY  1999  . BREAST BIOPSY Left 2015   Negative  . BREAST SURGERY Right 2005,2014   biopsy  . BREAST SURGERY Right 2014   mastectomy  . COLONOSCOPY  2002  . COLONOSCOPY WITH  PROPOFOL N/A 10/16/2015   Procedure: COLONOSCOPY WITH PROPOFOL;  Surgeon: Robert Bellow, MD;  Location: [facility] ENDOSCOPY;  Service: Endoscopy;  Laterality: N/A;  . KNEE SURGERY Left 2005  . MASTECTOMY Right 2014   radiation chemo  . thumb surg Right 2004  . TONSILLECTOMY     age of 70    Her family history includes Cancer in her mother and other; Heart disease in her father; Hypertension in her sister; Osteoporosis in her paternal aunt and paternal grandmother.     No outpatient prescriptions have been marked as taking for the 11/05/16 encounter (Office Visit) with Trinna Post, PA-C.    Patient Care Team: Trinna Post, PA-C as PCP - General (Physician Assistant) Robert Bellow, MD as Consulting Physician (General Surgery) Forest Gleason, MD (Oncology)   Dr. Bartholome Bill, MD (cardiology)  Objective:   Vitals: BP (!) 144/88 (BP Location: Right Arm, Patient Position: Sitting)   Pulse 80   Temp 98.7 F (37.1 C) (Oral)   Resp 16   Ht 5\' 4"  (1.626 m)   Wt 238 lb (108 kg)   BMI 40.85 kg/m   Physical Exam  Constitutional: She is oriented to person, place, and time. She appears well-developed and well-nourished. No distress.  HENT:  Head: Normocephalic.  Mouth/Throat: Oropharynx is clear and moist. No oropharyngeal exudate.  Eyes: Pupils are equal, round, and reactive to light.  Neck: Normal range of motion. Neck supple.  Cardiovascular: Normal rate, regular rhythm and normal heart sounds.   Pulmonary/Chest: Effort normal and breath sounds normal.    Abdominal: Soft. Bowel sounds are normal.  Neurological: She is alert and oriented to person, place, and time.  Skin: Skin is warm and dry. She is not diaphoretic.  Psychiatric: She has a normal mood and affect. Her behavior is normal.    Activities of Daily Living In your present state of health, do you have any difficulty performing the following activities: 11/05/2016  Hearing? N  Vision? Y  Difficulty  concentrating or making decisions? N  Walking or climbing stairs? Y  Dressing or bathing? N  Doing errands, shopping? N  Some recent data might be hidden    Fall Risk Assessment Fall Risk  11/05/2016 08/22/2015 07/26/2015  Falls in the past year? No No No     Depression Screen PHQ 2/9 Scores 11/05/2016 11/05/2016 08/22/2015 07/26/2015  PHQ - 2 Score 0 0 0 4  PHQ- 9 Score - - - 14    Cognitive Testing - 6-CIT  Correct? Score   What year is it? yes 0 0 or 4  What month is it? yes 0 0 or 3  Memorize:    Pia Mau,  41,  [place],      What time is it? (within 1 hour) yes 0 0 or 3  Count backwards from 20 yes 0 0, 2, or 4  Name the months of the year yes 0 0, 2, or 4  Repeat name & address above yes 0 0, 2, 4, 6, 8, or 10  TOTAL SCORE  0/28   Interpretation:  Normal  Normal (0-7) Abnormal (8-28)        Assessment & Plan:     Annual Wellness Visit  Reviewed patient's Family Medical History Reviewed and updated list of patient's medical providers Assessment of cognitive impairment was done Assessed patient's functional ability Established a written schedule for health screening [place] Completed and Reviewed  Exercise Activities and Dietary recommendations Goals    . Exercise 150 minutes per week (moderate activity)       Immunization History  Administered Date(s) Administered  . Influenza,inj,Quad PF,36+ Mos 09/06/2015  . Pneumococcal Conjugate-13 08/22/2015    Health Maintenance  Topic Date Due  . Hepatitis C Screening  [birth year]  . TETANUS/TDAP  03/23/1969  . ZOSTAVAX  03/23/2010  . MAMMOGRAM  09/15/2018  . COLONOSCOPY  10/15/2025  . INFLUENZA VACCINE  Completed  . DEXA SCAN  Completed  . PNA vac Low Risk Adult  Completed     Discussed health benefits of physical activity, and encouraged her to engage in regular exercise appropriate for her age and condition.   Patient reports she hasn't been taking her  lisinopril 20 mg daily for months because she forgot whether or not she should take it, including her last cardiology visit. It is unclear to me from the notes whether her other providers are aware of this. No dizzy spells, headaches. Blood pressure in office today was slightly high, but patient has another appointment in December will vitals will be recorded. Have discontinued Lisinopril today because BP seems relatively well controlled without it. Will reassess at future appointments.  Counseled on advanced directive, living will, and DNR.   ------------------------------------------------------------------------------------------------------------    Trinna Post, PA-C  [place]

## 2016-11-05 NOTE — Patient Instructions (Addendum)
Advance Directive Advance directives are the legal documents that allow you to make choices about your health care and medical treatment if you cannot speak for yourself. Advance directives are a way for you to communicate your wishes to family, friends, and health care providers. The specified people can then convey your decisions about end-of-life care to avoid confusion if you should become unable to communicate. Ideally, the process of discussing and writing advance directives should happen over time rather than making decisions all at once. Advance directives can be modified as your situation changes, and you can change your mind at any time, even after you have signed the advance directives. Each state has its own laws regarding advance directives. You may want to check with your health care provider, attorney, or state representative about the law in your state. Below are some examples of advance directives. HEALTH CARE PROXY AND DURABLE POWER OF ATTORNEY FOR HEALTH CARE A health care proxy is a person (agent) appointed to make medical decisions for you if you cannot. Generally, people choose someone they know well and trust to represent their preferences when they can no longer do so. You should be sure to ask this person for agreement to act as your agent. An agent may have to exercise judgment in the event of a medical decision for which your wishes are not known. A durable power of attorney for health care is a legal document that names your health care proxy. Depending on the laws in your state, after the document is written, it may also need to be:  Signed.  Notarized.  Dated.  Copied.  Witnessed.  Incorporated into your medical record. You may also want to appoint someone to manage your financial affairs if you cannot. This is called a durable power of attorney for finances. It is a separate legal document from the durable power of attorney for health care. You may choose the same  person or someone different from your health care proxy to act as your agent in financial matters. LIVING WILL A living will is a set of instructions documenting your wishes about medical care when you cannot care for yourself. It is used if you become:  Terminally ill.  Incapacitated.  Unable to communicate.  Unable to make decisions. Items to consider in your living will include:  The use or non-use of life-sustaining equipment, such as dialysis machines and breathing machines (ventilators).  A do not resuscitate (DNR) order, which is the instruction not to use cardiopulmonary resuscitation (CPR) if breathing or heartbeat stops.  Tube feeding.  Withholding of food and fluids.  Comfort (palliative) care when the goal becomes comfort rather than a cure.  Organ and tissue donation. A living will does not give instructions about distribution of your money and property if you should pass away. It is advisable to seek the expert advice of a lawyer in drawing up a will regarding your possessions. Decisions about taxes, beneficiaries, and asset distribution will be legally binding. This process can relieve your family and friends of any burdens surrounding disputes or questions that may come up about the allocation of your assets. DO NOT RESUSCITATE (DNR) A do not resuscitate (DNR) order is a request to not have CPR in the event that your heart stops beating or you stop breathing. Unless given other instructions, a health care provider will try to help any patient whose heart has stopped or who has stopped breathing.  This information is not intended to replace advice given to  you by your health care provider. Make sure you discuss any questions you have with your health care provider. Document Released: 03/16/2008 Document Revised: 03/31/2016 Document Reviewed: 04/27/2013 Elsevier Interactive Patient Education  2017 [employer]. g

## 2016-11-11 DIAGNOSIS — I480 Paroxysmal atrial fibrillation: Secondary | ICD-10-CM | POA: Diagnosis not present

## 2016-11-17 ENCOUNTER — Other Ambulatory Visit: Payer: Self-pay | Admitting: Family Medicine

## 2016-11-17 DIAGNOSIS — I10 Essential (primary) hypertension: Secondary | ICD-10-CM

## 2016-11-17 DIAGNOSIS — F419 Anxiety disorder, unspecified: Secondary | ICD-10-CM

## 2016-11-19 ENCOUNTER — Other Ambulatory Visit: Payer: Self-pay | Admitting: Hematology and Oncology

## 2016-11-24 ENCOUNTER — Inpatient Hospital Stay: Payer: Commercial Managed Care - HMO | Attending: Hematology and Oncology

## 2016-11-24 ENCOUNTER — Inpatient Hospital Stay (HOSPITAL_BASED_OUTPATIENT_CLINIC_OR_DEPARTMENT_OTHER): Payer: Commercial Managed Care - HMO | Admitting: Hematology and Oncology

## 2016-11-24 ENCOUNTER — Encounter: Payer: Self-pay | Admitting: Hematology and Oncology

## 2016-11-24 ENCOUNTER — Telehealth: Payer: Self-pay | Admitting: *Deleted

## 2016-11-24 ENCOUNTER — Encounter (INDEPENDENT_AMBULATORY_CARE_PROVIDER_SITE_OTHER): Payer: Self-pay

## 2016-11-24 VITALS — BP 142/74 | HR 87 | Temp 97.8°F | Resp 18 | Ht 64.0 in | Wt 232.1 lb

## 2016-11-24 DIAGNOSIS — I10 Essential (primary) hypertension: Secondary | ICD-10-CM

## 2016-11-24 DIAGNOSIS — F419 Anxiety disorder, unspecified: Secondary | ICD-10-CM

## 2016-11-24 DIAGNOSIS — Z170 Estrogen receptor positive status [ER+]: Secondary | ICD-10-CM

## 2016-11-24 DIAGNOSIS — Z79811 Long term (current) use of aromatase inhibitors: Secondary | ICD-10-CM

## 2016-11-24 DIAGNOSIS — Z800 Family history of malignant neoplasm of digestive organs: Secondary | ICD-10-CM | POA: Diagnosis not present

## 2016-11-24 DIAGNOSIS — I4891 Unspecified atrial fibrillation: Secondary | ICD-10-CM

## 2016-11-24 DIAGNOSIS — Z9012 Acquired absence of left breast and nipple: Secondary | ICD-10-CM | POA: Diagnosis not present

## 2016-11-24 DIAGNOSIS — K219 Gastro-esophageal reflux disease without esophagitis: Secondary | ICD-10-CM | POA: Diagnosis not present

## 2016-11-24 DIAGNOSIS — Z79899 Other long term (current) drug therapy: Secondary | ICD-10-CM | POA: Insufficient documentation

## 2016-11-24 DIAGNOSIS — M8580 Other specified disorders of bone density and structure, unspecified site: Secondary | ICD-10-CM | POA: Diagnosis not present

## 2016-11-24 DIAGNOSIS — Z923 Personal history of irradiation: Secondary | ICD-10-CM | POA: Insufficient documentation

## 2016-11-24 DIAGNOSIS — C50911 Malignant neoplasm of unspecified site of right female breast: Secondary | ICD-10-CM

## 2016-11-24 DIAGNOSIS — C50111 Malignant neoplasm of central portion of right female breast: Secondary | ICD-10-CM | POA: Insufficient documentation

## 2016-11-24 DIAGNOSIS — I480 Paroxysmal atrial fibrillation: Secondary | ICD-10-CM | POA: Diagnosis not present

## 2016-11-24 DIAGNOSIS — Z808 Family history of malignant neoplasm of other organs or systems: Secondary | ICD-10-CM

## 2016-11-24 DIAGNOSIS — Z7901 Long term (current) use of anticoagulants: Secondary | ICD-10-CM | POA: Diagnosis not present

## 2016-11-24 DIAGNOSIS — Z9221 Personal history of antineoplastic chemotherapy: Secondary | ICD-10-CM | POA: Insufficient documentation

## 2016-11-24 LAB — COMPREHENSIVE METABOLIC PANEL
ALT: 16 U/L (ref 14–54)
AST: 26 U/L (ref 15–41)
Albumin: 3.9 g/dL (ref 3.5–5.0)
Alkaline Phosphatase: 61 U/L (ref 38–126)
Anion gap: 11 (ref 5–15)
BUN: 15 mg/dL (ref 6–20)
CO2: 26 mmol/L (ref 22–32)
Calcium: 8.9 mg/dL (ref 8.9–10.3)
Chloride: 100 mmol/L — ABNORMAL LOW (ref 101–111)
Creatinine, Ser: 0.75 mg/dL (ref 0.44–1.00)
GFR calc Af Amer: >60 mL/min (ref >60)
GFR calc non Af Amer: >60 mL/min (ref >60)
Glucose, Bld: 124 mg/dL — ABNORMAL HIGH (ref 65–99)
Potassium: 3.6 mmol/L (ref 3.5–5.1)
Sodium: 137 mmol/L (ref 135–145)
Total Bilirubin: 0.6 mg/dL (ref 0.3–1.2)
Total Protein: 7.7 g/dL (ref 6.5–8.1)

## 2016-11-24 LAB — CBC WITH DIFFERENTIAL/PLATELET
Basophils Absolute: 0.1 10*3/uL (ref 0–0.1)
Basophils Relative: 1 %
Eosinophils Absolute: 0.1 10*3/uL (ref 0–0.7)
Eosinophils Relative: 1 %
HCT: 39.1 % (ref 35.0–47.0)
Hemoglobin: 13.5 g/dL (ref 12.0–16.0)
Lymphocytes Relative: 19 %
Lymphs Abs: 1.8 10*3/uL (ref 1.0–3.6)
MCH: 29.3 pg (ref 26.0–34.0)
MCHC: 34.5 g/dL (ref 32.0–36.0)
MCV: 84.9 fL (ref 80.0–100.0)
Monocytes Absolute: 0.8 10*3/uL (ref 0.2–0.9)
Monocytes Relative: 8 %
Neutro Abs: 6.8 10*3/uL — ABNORMAL HIGH (ref 1.4–6.5)
Neutrophils Relative %: 71 %
Platelets: 208 10*3/uL (ref 150–440)
RBC: 4.61 MIL/uL (ref 3.80–5.20)
RDW: 15.9 % — ABNORMAL HIGH (ref 11.5–14.5)
WBC: 9.7 10*3/uL (ref 3.6–11.0)

## 2016-11-24 NOTE — Progress Notes (Signed)
Pt doing fine, still has breast wetness and takes nystatin when needed. Sees breast doctor and had recent mammogram and it was good, never does breast exams. No complaints

## 2016-11-24 NOTE — Telephone Encounter (Signed)
Entered in error

## 2016-11-24 NOTE — Progress Notes (Signed)
[facility] day:  11/24/16   Chief Complaint: Erin Romero is a 65 y.o. female with stage IIB right breast cancer who is seen for 6 month assessment.  HPI: The patient was last seen in the medical oncology clinic on 05/26/2016.  At that time, she was having difficulty with the 1 year anniversary of the loss of her husband.  She had a mild fungal infection beneath her left breast.  She had mild vasomotor symptoms. Exam was stable.  CBC with diff, CMP, and CA27.29 (31.3) were normal.  Digital screening left sided mammogram on 09/15/2016 revealed no evidence of malignancy.  She saw Dr. Bary Castilla on 09/22/2016.  She noted a random stinging sensation in her left breast.  Exam revealed a stable breast exam.  She has a follow-up in 1 year.  During the interim, she has done well.  She denies any breast concerns.  She is not performing monthly exams.  She uses Nystatin topically to prevent Candidal rash under her breasts.   Past Medical History:  Diagnosis Date  . Anxiety   . Atrial fibrillation ([place]) 2014  . Breast cancer ([place])    right breast  . Cancer ([place])    Breast   . Fibrocystic breast disease 2005  . GERD (gastroesophageal reflux disease)   . Hypertension 2005  . Lump or mass in breast 2014   right mastectomy  . Malignant neoplasm of central portion of female breast ([place]) January 24, 2013   stage II,T2, N1a. Mastectomy with sentinel node biopsy. 2 sentinel nodes negative, non-sentinel node w/ 5 mm macrometastatic foci in non-sentinel node.  Received post mastectomy radiation.     Past Surgical History:  Procedure Laterality Date  . ABDOMINAL HYSTERECTOMY  1999  . BREAST BIOPSY Left 2015   Negative  . BREAST SURGERY Right 2005,2014   biopsy  . BREAST SURGERY Right 2014   mastectomy  . COLONOSCOPY  2002  . COLONOSCOPY WITH PROPOFOL N/A 10/16/2015   Procedure: COLONOSCOPY WITH PROPOFOL;  Surgeon: Robert Bellow, MD;   Location: [facility] ENDOSCOPY;  Service: Endoscopy;  Laterality: N/A;  . KNEE SURGERY Left 2005  . MASTECTOMY Right 2014   radiation chemo  . thumb surg Right 2004  . TONSILLECTOMY     age of 62    Family History  Problem Relation Age of Onset  . Heart disease Father   . Hypertension Sister   . Cancer Mother     cervical  . Cancer Other     ovarian,liver,colon cancer-no family member listed  . Osteoporosis Paternal Aunt   . Osteoporosis Paternal Grandmother     Social History:  reports that she has never smoked. She has never used smokeless tobacco. She reports that she does not drink alcohol or use drugs.  Her husband, Ludwig Clarks, died 2015-06-14. She lives in [place].  The patient is alone today.  Allergies:  Allergies  Allergen Reactions  . Taxotere [Docetaxel] Itching and Rash  . Sulfa Antibiotics Hives    Current Medications: Current Outpatient Prescriptions  Medication Sig Dispense Refill  . acetaminophen (TYLENOL) 500 MG tablet Take 500-1,000 mg by mouth every 4 (four) hours as needed for pain (q 4-6 prn).    . Ascorbic Acid (VITAMIN C) 1000 MG tablet Take 500 mg by mouth daily.     Marland Kitchen aspirin 81 MG chewable tablet Chew 81 mg by mouth daily.    . bisoprolol-hydrochlorothiazide (ZIAC) 10-6.25 MG tablet TAKE 1 TABLET EVERY  DAY 90 tablet 3  . Calcium Carb-Cholecalciferol (OYSTER SHELL CALCIUM + D PO) Take 1 tablet by mouth 2 (two) times daily.     . Cholecalciferol (VITAMIN D-3 PO) Take 1,000 Units by mouth daily.     Marland Kitchen docusate sodium (COLACE) 100 MG capsule Take 100 mg by mouth 2 (two) times daily.    Marland Kitchen escitalopram (LEXAPRO) 10 MG tablet TAKE 1 TABLET EVERY DAY 90 tablet 4  . FLUZONE HIGH-DOSE 0.5 ML SUSY inject 0.5 milliliter intramuscularly  0  . hydrochlorothiazide (HYDRODIURIL) 12.5 MG tablet TAKE 1 TABLET EVERY DAY 90 tablet 4  . letrozole (FEMARA) 2.5 MG tablet TAKE 1 TABLET EVERY DAY 90 tablet 0  . Nystatin POWD 1 Dose by Does not apply route 2 (two) times daily as  needed. 1 Bottle 3  . omeprazole (PRILOSEC OTC) 20 MG tablet Take 20 mg by mouth daily.     . potassium chloride SA (K-DUR,KLOR-CON) 20 MEQ tablet TAKE 1 TABLET (20 MEQ TOTAL) BY MOUTH DAILY. 90 tablet 1  . traZODone (DESYREL) 50 MG tablet TAKE 1 TABLET EVERY DAY 90 tablet 3  . PNEUMOVAX 23 25 MCG/0.5ML injection inject 0.5 milliliter intramuscularly  0  . warfarin (COUMADIN) 2 MG tablet Take 2 mg by mouth daily.     No current facility-administered medications for this visit.     Review of Systems:  GENERAL:  Feels "fine" (don't do nothing).  No fevers or sweats.  Weight down 5 pounds. PERFORMANCE STATUS (ECOG):  1 HEENT:  No visual changes, runny nose, sore throat, mouth sores or tenderness. Lungs: No shortness of breath or cough.  No hemoptysis. Cardiac:  No chest pain, palpitations, orthopnea, or PND. GI:  No nausea, vomiting, diarrhea, constipation, melena or hematochezia. GU:  No urgency, frequency, dysuria, or hematuria. Musculoskeletal:  No back pain.  No joint pain.  No muscle tenderness. Extremities:  No pain or swelling. Skin:  No rashes, ulcers or skin changes.  Uses Nystatin powder prn to prevent Candida infection.  Neuro:  No headache, numbness or weakness, balance or coordination issues. Endocrine:  Vasomoter symptoms.  No diabetes, thyroid issues, hot flashes or night sweats. Psych:  No mood changes.  No anxiety or depression. Pain:  No focal pain. Review of systems:  All other systems reviewed and found to be negative.  Physical Exam: Blood pressure (!) 142/74, pulse 87, temperature 97.8 F (36.6 C), temperature source Tympanic, resp. rate 18, height 5' 4"  (1.626 m), weight 232 lb 2.3 oz (105.3 kg). GENERAL:  Well developed, well nourished, woman sitting comfortably in the exam room in no acute distress.  She is tearful at times. MENTAL STATUS:  Alert and oriented to person, place and time. HEAD:  Short brown hair with graying.  Normocephalic, atraumatic, face  symmetric, no Cushingoid features. EYES:  Glasses.  Blue/brown eyes.  Pupils equal round and reactive to light and accomodation.  No conjunctivitis or scleral icterus. ENT:  Oropharynx clear without lesion.  Tongue normal. Mucous membranes moist.  RESPIRATORY:  Clear to auscultation without rales, wheezes or rhonchi. CARDIOVASCULAR:  Regular rate and rhythm without murmur, rub or gallop. BREAST:  Right mastectomy.  No erythema or nodularity.  Left breast with fibrocystic changes inferiorly.  No masses, skin changes or nipple discharge.  ABDOMEN:  Soft, non-tender, with active bowel sounds, and no appreciable  hepatosplenomegaly.  No masses. SKIN:  No rashes, ulcers or skin lesions.  EXTREMITIES: No edema, no skin discoloration or tenderness.  No palpable cords. LYMPH  NODES: No palpable cervical, supraclavicular, axillary or inguinal adenopathy  NEUROLOGICAL: Unremarkable. PSYCH:  Appropriate.   Appointment on 11/24/2016  Component Date Value Ref Range Status  . WBC 11/24/2016 9.7  3.6 - 11.0 K/uL Final  . RBC 11/24/2016 4.61  3.80 - 5.20 MIL/uL Final  . Hemoglobin 11/24/2016 13.5  12.0 - 16.0 g/dL Final  . HCT 11/24/2016 39.1  35.0 - 47.0 % Final  . MCV 11/24/2016 84.9  80.0 - 100.0 fL Final  . MCH 11/24/2016 29.3  26.0 - 34.0 pg Final  . MCHC 11/24/2016 34.5  32.0 - 36.0 g/dL Final  . RDW 11/24/2016 15.9* 11.5 - 14.5 % Final  . Platelets 11/24/2016 208  150 - 440 K/uL Final  . Neutrophils Relative % 11/24/2016 71  % Final  . Neutro Abs 11/24/2016 6.8* 1.4 - 6.5 K/uL Final  . Lymphocytes Relative 11/24/2016 19  % Final  . Lymphs Abs 11/24/2016 1.8  1.0 - 3.6 K/uL Final  . Monocytes Relative 11/24/2016 8  % Final  . Monocytes Absolute 11/24/2016 0.8  0.2 - 0.9 K/uL Final  . Eosinophils Relative 11/24/2016 1  % Final  . Eosinophils Absolute 11/24/2016 0.1  0 - 0.7 K/uL Final  . Basophils Relative 11/24/2016 1  % Final  . Basophils Absolute 11/24/2016 0.1  0 - 0.1 K/uL Final  . Sodium  11/24/2016 137  135 - 145 mmol/L Final  . Potassium 11/24/2016 3.6  3.5 - 5.1 mmol/L Final  . Chloride 11/24/2016 100* 101 - 111 mmol/L Final  . CO2 11/24/2016 26  22 - 32 mmol/L Final  . Glucose, Bld 11/24/2016 124* 65 - 99 mg/dL Final  . BUN 11/24/2016 15  6 - 20 mg/dL Final  . Creatinine, Ser 11/24/2016 0.75  0.44 - 1.00 mg/dL Final  . Calcium 11/24/2016 8.9  8.9 - 10.3 mg/dL Final  . Total Protein 11/24/2016 7.7  6.5 - 8.1 g/dL Final  . Albumin 11/24/2016 3.9  3.5 - 5.0 g/dL Final  . AST 11/24/2016 26  15 - 41 U/L Final  . ALT 11/24/2016 16  14 - 54 U/L Final  . Alkaline Phosphatase 11/24/2016 61  38 - 126 U/L Final  . Total Bilirubin 11/24/2016 0.6  0.3 - 1.2 mg/dL Final  . GFR calc non Af Amer 11/24/2016 >60  >60 mL/min Final  . GFR calc Af Amer 11/24/2016 >60  >60 mL/min Final   Comment: (NOTE) The eGFR has been calculated using the CKD EPI equation. This calculation has not been validated in all clinical situations. eGFR's persistently <60 mL/min signify possible Chronic Kidney Disease.   . Anion gap 11/24/2016 11  5 - 15 Final    Assessment:  Carloyn Manner Stare is a 65 y.o. female with a history of stage IIB (T2N1M0) right breast cancer status post radical mastectomy on 01/24/2013. Pathology revealed a 3 cm grade II invasive mammary carcinoma.  Zero of 2 sentinel lymph nodes were positive.  A non-sentinel lymph node was positive for a 0.5 cm macrometastasis with extracapsular extension.  DCIS was present. Estrogen receptor was> 90%, progesterone receptor greater than 90%, and HER-2/neu negative.  She is s/p 3 cycles of Taxotere and Cytoxan (03/08/2013 - 04/20/2013). Treatment was complicated by erythromelalgia and an urticarial reaction. She did not receive any further chemotherapy.  She received radiation from 05/30/2013 until 07/19/2013. She began Femara.  Mammogram on 02/06/2014 was suspicious for left breast mass.  Left breast biopsy on 02/27/2014 was negative for  malignancy.  Left-sided  mammogram on 09/15/2016 was negative.    CA27.29 was 24.9 on 12/20/2014, 28.8 on 05/22/2015, 26.3 on 11/26/2015, 31.3 on 05/26/2016, and 22.0 on 11/24/2016.  Bone density study on 06/05/2015 revealed osteopenia with a T score of -1.9 in the left femoral neck.  Patient is on calcium and vitamin D.  Patient declined Prolia and Fosamax.  Symptomatically, she is doing well.  Exam is stable.  Plan: 1.  Labs today:  CBC with diff, CMP, CA27.29. 2.  Continue Femara. 3.  Encourage breast self exam. 4.  Rx:  mastectomy bra x 2 5.  RTC in 6 months for MD assessment and labs (CBC with diff, CMP, CA27.29).   Lequita Asal, MD  11/24/2016 , 12:27 PM

## 2016-11-25 LAB — CANCER ANTIGEN 27.29: CA 27.29: 22 U/mL (ref 0.0–38.6)

## 2016-12-29 DIAGNOSIS — I4891 Unspecified atrial fibrillation: Secondary | ICD-10-CM | POA: Diagnosis not present

## 2017-01-15 DIAGNOSIS — H524 Presbyopia: Secondary | ICD-10-CM | POA: Diagnosis not present

## 2017-01-15 DIAGNOSIS — H40003 Preglaucoma, unspecified, bilateral: Secondary | ICD-10-CM | POA: Diagnosis not present

## 2017-01-21 ENCOUNTER — Other Ambulatory Visit: Payer: Self-pay | Admitting: Hematology and Oncology

## 2017-01-26 DIAGNOSIS — I4891 Unspecified atrial fibrillation: Secondary | ICD-10-CM | POA: Diagnosis not present

## 2017-02-23 DIAGNOSIS — I480 Paroxysmal atrial fibrillation: Secondary | ICD-10-CM | POA: Diagnosis not present

## 2017-03-02 ENCOUNTER — Telehealth: Payer: Self-pay

## 2017-03-02 NOTE — Telephone Encounter (Signed)
I contacted patient to complete Follow up questionnaire for patient continued participation in [facility] Surgical Adjuvant Breast and Bowel Project) B-47 research study.  Patient was due for 48 month post randomization medical conditions and lifestyle questionnaire. She answered questionnaire items over telephone and it was faxed to [facility].  Patient has one more questionnaire to complete at 60 month post randomization and she is agreeable to remain in follow up portion of research study. Patient was thanked for her time.

## 2017-03-18 ENCOUNTER — Other Ambulatory Visit: Payer: Self-pay

## 2017-03-18 DIAGNOSIS — I10 Essential (primary) hypertension: Secondary | ICD-10-CM

## 2017-03-18 DIAGNOSIS — G4700 Insomnia, unspecified: Secondary | ICD-10-CM

## 2017-03-18 MED ORDER — TRAZODONE HCL 50 MG PO TABS: 50.0000 mg | ORAL_TABLET | Freq: Every day | ORAL | 1 refills | Status: DC

## 2017-03-18 MED ORDER — BISOPROLOL-HYDROCHLOROTHIAZIDE 10-6.25 MG PO TABS: 1.0000 | ORAL_TABLET | Freq: Every day | ORAL | 1 refills | Status: DC

## 2017-03-23 DIAGNOSIS — I4891 Unspecified atrial fibrillation: Secondary | ICD-10-CM | POA: Diagnosis not present

## 2017-03-25 ENCOUNTER — Other Ambulatory Visit: Payer: Self-pay | Admitting: Hematology and Oncology

## 2017-04-10 DIAGNOSIS — I10 Essential (primary) hypertension: Secondary | ICD-10-CM | POA: Diagnosis not present

## 2017-04-10 DIAGNOSIS — Z6841 Body Mass Index (BMI) 40.0 and over, adult: Secondary | ICD-10-CM | POA: Diagnosis not present

## 2017-04-10 DIAGNOSIS — E7800 Pure hypercholesterolemia, unspecified: Secondary | ICD-10-CM | POA: Diagnosis not present

## 2017-04-10 DIAGNOSIS — I4891 Unspecified atrial fibrillation: Secondary | ICD-10-CM | POA: Diagnosis not present

## 2017-04-21 ENCOUNTER — Ambulatory Visit
Admission: RE | Admit: 2017-04-21 | Discharge: 2017-04-21 | Disposition: A | Discharge status: Home / Self Care | Payer: Medicare HMO | Source: Ambulatory Visit | Attending: Physician Assistant | Admitting: Physician Assistant

## 2017-04-21 ENCOUNTER — Encounter: Payer: Self-pay | Admitting: Physician Assistant

## 2017-04-21 ENCOUNTER — Ambulatory Visit (INDEPENDENT_AMBULATORY_CARE_PROVIDER_SITE_OTHER): Payer: Medicare HMO | Admitting: Physician Assistant

## 2017-04-21 VITALS — BP 148/76 | HR 84 | Temp 98.6°F | Resp 16 | Wt 237.0 lb

## 2017-04-21 DIAGNOSIS — M1711 Unilateral primary osteoarthritis, right knee: Secondary | ICD-10-CM | POA: Diagnosis not present

## 2017-04-21 DIAGNOSIS — M25561 Pain in right knee: Secondary | ICD-10-CM

## 2017-04-21 NOTE — Progress Notes (Signed)
Patient: Erin Romero Female    DOB: Jun 27, 1951   66 y.o.   MRN: [id number] Visit Date: 04/21/2017  Today's Provider: Trinna Post, PA-C   Chief Complaint  Patient presents with  . Knee Pain    Right side; started around 04/10/2017   Subjective:    Erin Romero Ward is a 66 y/o woman on Coumadin for atrial fibrillation presenting today with right knee pain ongoing for two weeks. She feels it in the left side of her right knee. Does not remember mechanism of injury. No hip or ankle pain. Trouble walking 2/2 pain when bearing weight. Never injured this knee before, though has had "torn cartilage" repaired by Dr. Jefm Bryant in her left knee many years ago.    Knee Pain   The incident occurred more than 1 week ago. There was no injury mechanism. The pain is present in the right knee. The quality of the pain is described as stabbing and aching. The pain is at a severity of 9/10. The pain has been worsening since onset. Associated symptoms include an inability to bear weight. Pertinent negatives include no loss of motion, loss of sensation, muscle weakness, numbness or tingling. She reports no foreign bodies present. The symptoms are aggravated by weight bearing, movement and palpation. She has tried acetaminophen and elevation for the symptoms. The treatment provided no relief.       Allergies  Allergen Reactions  . Taxotere [Docetaxel] Itching and Rash  . Sulfa Antibiotics Hives     Current Outpatient Prescriptions:  .  acetaminophen (TYLENOL) 500 MG tablet, Take 500-1,000 mg by mouth every 4 (four) hours as needed for pain (q 4-6 prn)., Disp: , Rfl:  .  Ascorbic Acid (VITAMIN C) 1000 MG tablet, Take 500 mg by mouth daily. , Disp: , Rfl:  .  aspirin 81 MG chewable tablet, Chew 81 mg by mouth daily., Disp: , Rfl:  .  bisoprolol-hydrochlorothiazide (ZIAC) 10-6.25 MG tablet, Take 1 tablet by mouth daily., Disp: 90 tablet, Rfl: 1 .  Calcium Carb-Cholecalciferol (OYSTER  SHELL CALCIUM + D PO), Take 1 tablet by mouth 2 (two) times daily. , Disp: , Rfl:  .  Cholecalciferol (VITAMIN D-3 PO), Take 1,000 Units by mouth daily. , Disp: , Rfl:  .  docusate sodium (COLACE) 100 MG capsule, Take 100 mg by mouth 2 (two) times daily., Disp: , Rfl:  .  escitalopram (LEXAPRO) 10 MG tablet, TAKE 1 TABLET EVERY DAY, Disp: 90 tablet, Rfl: 4 .  FLUZONE HIGH-DOSE 0.5 ML SUSY, inject 0.5 milliliter intramuscularly, Disp: , Rfl: 0 .  hydrochlorothiazide (HYDRODIURIL) 12.5 MG tablet, TAKE 1 TABLET EVERY DAY, Disp: 90 tablet, Rfl: 4 .  letrozole (FEMARA) 2.5 MG tablet, TAKE 1 TABLET EVERY DAY, Disp: 90 tablet, Rfl: 0 .  Nystatin POWD, 1 Dose by Does not apply route 2 (two) times daily as needed., Disp: 1 Bottle, Rfl: 3 .  omeprazole (PRILOSEC OTC) 20 MG tablet, Take 20 mg by mouth daily. , Disp: , Rfl:  .  PNEUMOVAX 23 25 MCG/0.5ML injection, inject 0.5 milliliter intramuscularly, Disp: , Rfl: 0 .  potassium chloride SA (K-DUR,KLOR-CON) 20 MEQ tablet, TAKE 1 TABLET (20 MEQ TOTAL) BY MOUTH DAILY., Disp: 90 tablet, Rfl: 1 .  traZODone (DESYREL) 50 MG tablet, Take 1 tablet (50 mg total) by mouth daily., Disp: 90 tablet, Rfl: 1 .  warfarin (COUMADIN) 2 MG tablet, Take 2 mg by mouth daily., Disp: , Rfl:   Review of  Systems  Constitutional: Negative.   Musculoskeletal: Positive for arthralgias, back pain, gait problem and joint swelling. Negative for myalgias, neck pain and neck stiffness.  Neurological: Negative for dizziness, tingling, light-headedness, numbness and headaches.  Hematological: Negative for adenopathy. Bruises/bleeds easily.    Social History  Substance Use Topics  . Smoking status: Never Smoker  . Smokeless tobacco: Never Used  . Alcohol use No   Objective:   BP (!) 148/76 (BP Location: Left Arm, Patient Position: Sitting, Cuff Size: Large)   Pulse 84   Temp 98.6 F (37 C) (Oral)   Resp 16   Wt 237 lb (107.5 kg)   BMI 40.68 kg/m  Vitals:   04/21/17 1124    BP: (!) 148/76  Pulse: 84  Resp: 16  Temp: 98.6 F (37 C)  TempSrc: Oral  Weight: 237 lb (107.5 kg)     Physical Exam  Constitutional: She appears well-developed and well-nourished.  Musculoskeletal: Normal range of motion. She exhibits edema and tenderness. She exhibits no deformity.       Right knee: She exhibits swelling. She exhibits normal range of motion, no effusion, no ecchymosis, no deformity, no laceration, no erythema, normal alignment, no LCL laxity, normal patellar mobility and no bony tenderness. Tenderness found. Medial joint line tenderness noted. No lateral joint line, no MCL and no LCL tenderness noted.  There is some soft tissue swelling on the medial aspect of the right knee, as well as tenderness to palpation in this area. Negative anterior/posterior drawer test. No pain with varus or valgus stress of right leg.   Neurological: She has normal strength. No sensory deficit. Gait abnormal.  Limping 2/2 pain         Assessment & Plan:     1. Acute pain of right knee  No bony abnormalities apparent on exam, suspect soft tissue injury. Will get XRAY to make sure there's no fracture. Will refer to ortho, patient says she needs to check with her in network provider list and call us back if we need to make a change.   - DG Knee Complete 4 Views Right; Future - Ambulatory referral to [place], [place]

## 2017-04-21 NOTE — Patient Instructions (Signed)

## 2017-04-22 ENCOUNTER — Telehealth: Payer: Self-pay

## 2017-04-22 NOTE — Telephone Encounter (Signed)
-----   Message from Trinna Post, [place] sent at 04/22/2017  9:49 AM EDT ----- Some degenerative changes but otherwise normal knee xray. Please proceed with orthopedic referral.

## 2017-04-22 NOTE — Telephone Encounter (Signed)
Patient advised.

## 2017-04-29 DIAGNOSIS — M1711 Unilateral primary osteoarthritis, right knee: Secondary | ICD-10-CM | POA: Diagnosis not present

## 2017-04-29 DIAGNOSIS — M25561 Pain in right knee: Secondary | ICD-10-CM | POA: Diagnosis not present

## 2017-05-04 ENCOUNTER — Other Ambulatory Visit: Payer: Self-pay | Admitting: Family Medicine

## 2017-05-04 DIAGNOSIS — E876 Hypokalemia: Secondary | ICD-10-CM

## 2017-05-08 DIAGNOSIS — I4891 Unspecified atrial fibrillation: Secondary | ICD-10-CM | POA: Diagnosis not present

## 2017-05-12 DIAGNOSIS — M1711 Unilateral primary osteoarthritis, right knee: Secondary | ICD-10-CM | POA: Diagnosis not present

## 2017-05-12 DIAGNOSIS — R609 Edema, unspecified: Secondary | ICD-10-CM | POA: Diagnosis not present

## 2017-05-12 DIAGNOSIS — M25561 Pain in right knee: Secondary | ICD-10-CM | POA: Diagnosis not present

## 2017-05-14 DIAGNOSIS — H40013 Open angle with borderline findings, low risk, bilateral: Secondary | ICD-10-CM | POA: Diagnosis not present

## 2017-05-15 DIAGNOSIS — M25561 Pain in right knee: Secondary | ICD-10-CM | POA: Diagnosis not present

## 2017-05-15 DIAGNOSIS — R609 Edema, unspecified: Secondary | ICD-10-CM | POA: Diagnosis not present

## 2017-05-15 DIAGNOSIS — M1711 Unilateral primary osteoarthritis, right knee: Secondary | ICD-10-CM | POA: Diagnosis not present

## 2017-05-19 DIAGNOSIS — R609 Edema, unspecified: Secondary | ICD-10-CM | POA: Diagnosis not present

## 2017-05-19 DIAGNOSIS — M25561 Pain in right knee: Secondary | ICD-10-CM | POA: Diagnosis not present

## 2017-05-19 DIAGNOSIS — M1711 Unilateral primary osteoarthritis, right knee: Secondary | ICD-10-CM | POA: Diagnosis not present

## 2017-05-20 ENCOUNTER — Telehealth: Payer: Self-pay | Admitting: Physician Assistant

## 2017-05-20 NOTE — Telephone Encounter (Signed)
Apt made for 05/21/2017 at 11am.   Thanks,   -Mickel Baas

## 2017-05-20 NOTE — Telephone Encounter (Signed)
Erin Romero, can we please schedule this patient for an office visit? It looks like she is coming up on some refills for depression medication and also some changes have been made to her BP medication that I wanted to get straight. Thank you! Last time we saw her for non acute was annual wellness in 10/2016.

## 2017-05-20 NOTE — Telephone Encounter (Signed)
-----   Message from Trinna Post, [place] sent at 03/18/2017  8:42 AM EDT ----- Please schedule BP visit for this patient after cardiology

## 2017-05-21 ENCOUNTER — Ambulatory Visit (INDEPENDENT_AMBULATORY_CARE_PROVIDER_SITE_OTHER): Payer: Medicare HMO | Admitting: Physician Assistant

## 2017-05-21 VITALS — BP 160/80 | HR 74 | Temp 98.4°F | Resp 16 | Wt 236.0 lb

## 2017-05-21 DIAGNOSIS — F419 Anxiety disorder, unspecified: Secondary | ICD-10-CM | POA: Diagnosis not present

## 2017-05-21 DIAGNOSIS — F32A Depression, unspecified: Secondary | ICD-10-CM

## 2017-05-21 DIAGNOSIS — F329 Major depressive disorder, single episode, unspecified: Secondary | ICD-10-CM | POA: Diagnosis not present

## 2017-05-21 DIAGNOSIS — I10 Essential (primary) hypertension: Secondary | ICD-10-CM | POA: Diagnosis not present

## 2017-05-21 MED ORDER — LISINOPRIL 10 MG PO TABS: 10.0000 mg | ORAL_TABLET | Freq: Every day | ORAL | 0 refills | Status: DC

## 2017-05-21 NOTE — Patient Instructions (Signed)

## 2017-05-21 NOTE — Progress Notes (Signed)
Patient: Erin Romero Female    DOB: [birth year]   66 y.o.   MRN: [id number] Visit Date: 05/21/2017  Today's Provider: Trinna Post, PA-C   Chief Complaint  Patient presents with  . Hypertension  . Depression   Subjective:    HPI  Patient is here for follow up. B/P: patient is not checking her b/p. No cardiac symptoms present. She is taking Ziac and HCTZ. Patient saw Dr Ubaldo Glassing on 04/10/17 for follow up and that time patient states he stopped Amlodipine due patient was having issue with fatigue. At one point, she was on Lisinopril 20 mg. When she was in this office last for AWV 10/2016, her BP was borderline so we agreed to watch closely. She has seen cardiology between then and was discontinued on her amlodipine.  BP Readings from Last 3 Encounters:  05/21/17 (!) 162/82  04/21/17 (!) 148/76  11/24/16 (!) 142/74   Depression: patient is having bad days sometimes mourning her husbands death in February 21, 2015. This is nearing the second anniversary of his death and they met around memorial day so this is a hard time for her. She is stable on her current Lexapro dose. Lives with her deceased husband's mother and is her primary caretaker. This is stressful to her as well. Has also been going through some recent workup and PT for her right knee pain.   Depression screen [facility] 2/9 05/21/2017 11/05/2016 11/05/2016  Decreased Interest 1 0 0  Down, Depressed, Hopeless 0 0 0  PHQ - 2 Score 1 0 0  Altered sleeping 1 - -  Tired, decreased energy 3 - -  Change in appetite 0 - -  Feeling bad or failure about yourself  0 - -  Trouble concentrating 0 - -  Moving slowly or fidgety/restless 0 - -  Suicidal thoughts 0 - -  PHQ-9 Score 5 - -  Difficult doing work/chores - - -       Allergies  Allergen Reactions  . Taxotere [Docetaxel] Itching and Rash  . Sulfa Antibiotics Hives     Current Outpatient Prescriptions:  .  acetaminophen (TYLENOL) 500 MG tablet, Take 500-1,000 mg by mouth  every 4 (four) hours as needed for pain (q 4-6 prn)., Disp: , Rfl:  .  Ascorbic Acid (VITAMIN C) 1000 MG tablet, Take 500 mg by mouth daily. , Disp: , Rfl:  .  aspirin 81 MG chewable tablet, Chew 81 mg by mouth daily., Disp: , Rfl:  .  bisoprolol-hydrochlorothiazide (ZIAC) 10-6.25 MG tablet, Take 1 tablet by mouth daily., Disp: 90 tablet, Rfl: 1 .  Calcium Carb-Cholecalciferol (OYSTER SHELL CALCIUM + D PO), Take 1 tablet by mouth 2 (two) times daily. , Disp: , Rfl:  .  Cholecalciferol (VITAMIN D-3 PO), Take 1,000 Units by mouth daily. , Disp: , Rfl:  .  docusate sodium (COLACE) 100 MG capsule, Take 100 mg by mouth 2 (two) times daily., Disp: , Rfl:  .  escitalopram (LEXAPRO) 10 MG tablet, TAKE 1 TABLET EVERY DAY, Disp: 90 tablet, Rfl: 4 .  hydrochlorothiazide (HYDRODIURIL) 12.5 MG tablet, TAKE 1 TABLET EVERY DAY, Disp: 90 tablet, Rfl: 4 .  letrozole (FEMARA) 2.5 MG tablet, TAKE 1 TABLET EVERY DAY, Disp: 90 tablet, Rfl: 0 .  omeprazole (PRILOSEC OTC) 20 MG tablet, Take 20 mg by mouth daily. , Disp: , Rfl:  .  Potassium Chloride ER 20 MEQ TBCR, TAKE 1 TABLET (20 MEQ TOTAL) BY MOUTH DAILY., Disp: 90 tablet,  Rfl: 1 .  traZODone (DESYREL) 50 MG tablet, Take 1 tablet (50 mg total) by mouth daily., Disp: 90 tablet, Rfl: 1 .  FLUZONE HIGH-DOSE 0.5 ML SUSY, inject 0.5 milliliter intramuscularly, Disp: , Rfl: 0 .  PNEUMOVAX 23 25 MCG/0.5ML injection, inject 0.5 milliliter intramuscularly, Disp: , Rfl: 0 .  warfarin (COUMADIN) 2 MG tablet, Take 2 mg by mouth daily., Disp: , Rfl:   Review of Systems  Constitutional: Positive for fatigue.  Eyes: Positive for visual disturbance.  Respiratory: Negative.   Cardiovascular: Negative.   Musculoskeletal: Positive for arthralgias and gait problem.  Psychiatric/Behavioral: Positive for sleep disturbance.       Depression    Social History  Substance Use Topics  . Smoking status: Never Smoker  . Smokeless tobacco: Never Used  . Alcohol use No   Objective:     BP (!) 162/82   Pulse 74   Temp 98.4 F (36.9 C)   Resp 16   Wt 236 lb (107 kg)   SpO2 92%   BMI 40.51 kg/m  Vitals:   05/21/17 1106  BP: (!) 162/82  Pulse: 74  Resp: 16  Temp: 98.4 F (36.9 C)  SpO2: 92%  Weight: 236 lb (107 kg)     Physical Exam  Constitutional: She is oriented to person, place, and time. She appears well-developed and well-nourished.  Cardiovascular: Normal rate and regular rhythm.   Pulmonary/Chest: Effort normal and breath sounds normal.  Neurological: She is alert and oriented to person, place, and time.  Skin: Skin is warm and dry.  Psychiatric: She has a normal mood and affect. Her behavior is normal.        Assessment & Plan:     1. Essential hypertension  Worsening. Have reviewed cardiology note and it appears Dr. Ubaldo Glassing is under impression patient is still taking Lisinopril. Will restart her on this and see her back in one month for BP recheck.  - lisinopril (PRINIVIL,ZESTRIL) 10 MG tablet; Take 1 tablet (10 mg total) by mouth daily.  Dispense: 90 tablet; Refill: 0  2. Anxiety  Stable on Lexparo.   3. Depression, unspecified depression type  Stable on Lexapro.   Return in about 1 month (around 06/20/2017) for BP recheck.  The entirety of the information documented in the History of Present Illness, Review of Systems and Physical Exam were personally obtained by me. Portions of this information were initially documented by [facility] and reviewed by me for thoroughness and accuracy.             Trinna Post, PA-C  [facility]

## 2017-05-22 DIAGNOSIS — R600 Localized edema: Secondary | ICD-10-CM | POA: Diagnosis not present

## 2017-05-22 DIAGNOSIS — M1711 Unilateral primary osteoarthritis, right knee: Secondary | ICD-10-CM | POA: Diagnosis not present

## 2017-05-22 DIAGNOSIS — M25561 Pain in right knee: Secondary | ICD-10-CM | POA: Diagnosis not present

## 2017-05-25 ENCOUNTER — Inpatient Hospital Stay: Payer: Medicare HMO

## 2017-05-25 ENCOUNTER — Inpatient Hospital Stay: Payer: Medicare HMO | Admitting: Hematology and Oncology

## 2017-05-26 DIAGNOSIS — M25561 Pain in right knee: Secondary | ICD-10-CM | POA: Diagnosis not present

## 2017-05-26 DIAGNOSIS — M1711 Unilateral primary osteoarthritis, right knee: Secondary | ICD-10-CM | POA: Diagnosis not present

## 2017-05-26 DIAGNOSIS — R609 Edema, unspecified: Secondary | ICD-10-CM | POA: Diagnosis not present

## 2017-05-27 ENCOUNTER — Other Ambulatory Visit: Payer: Self-pay | Admitting: Hematology and Oncology

## 2017-05-29 DIAGNOSIS — M1711 Unilateral primary osteoarthritis, right knee: Secondary | ICD-10-CM | POA: Diagnosis not present

## 2017-05-29 DIAGNOSIS — R600 Localized edema: Secondary | ICD-10-CM | POA: Diagnosis not present

## 2017-05-29 DIAGNOSIS — M25561 Pain in right knee: Secondary | ICD-10-CM | POA: Diagnosis not present

## 2017-06-02 DIAGNOSIS — M1711 Unilateral primary osteoarthritis, right knee: Secondary | ICD-10-CM | POA: Diagnosis not present

## 2017-06-02 DIAGNOSIS — R609 Edema, unspecified: Secondary | ICD-10-CM | POA: Diagnosis not present

## 2017-06-02 DIAGNOSIS — M25561 Pain in right knee: Secondary | ICD-10-CM | POA: Diagnosis not present

## 2017-06-04 DIAGNOSIS — I480 Paroxysmal atrial fibrillation: Secondary | ICD-10-CM | POA: Diagnosis not present

## 2017-06-05 DIAGNOSIS — M6281 Muscle weakness (generalized): Secondary | ICD-10-CM | POA: Diagnosis not present

## 2017-06-09 DIAGNOSIS — M1711 Unilateral primary osteoarthritis, right knee: Secondary | ICD-10-CM | POA: Diagnosis not present

## 2017-06-09 DIAGNOSIS — M25561 Pain in right knee: Secondary | ICD-10-CM | POA: Diagnosis not present

## 2017-06-09 DIAGNOSIS — R609 Edema, unspecified: Secondary | ICD-10-CM | POA: Diagnosis not present

## 2017-06-11 ENCOUNTER — Ambulatory Visit: Payer: Medicare HMO | Admitting: Hematology and Oncology

## 2017-06-11 ENCOUNTER — Other Ambulatory Visit: Payer: Medicare HMO

## 2017-06-11 DIAGNOSIS — R600 Localized edema: Secondary | ICD-10-CM | POA: Diagnosis not present

## 2017-06-11 DIAGNOSIS — M25561 Pain in right knee: Secondary | ICD-10-CM | POA: Diagnosis not present

## 2017-06-11 DIAGNOSIS — M1711 Unilateral primary osteoarthritis, right knee: Secondary | ICD-10-CM | POA: Diagnosis not present

## 2017-06-11 DIAGNOSIS — M25669 Stiffness of unspecified knee, not elsewhere classified: Secondary | ICD-10-CM | POA: Diagnosis not present

## 2017-06-16 DIAGNOSIS — M1711 Unilateral primary osteoarthritis, right knee: Secondary | ICD-10-CM | POA: Diagnosis not present

## 2017-06-16 DIAGNOSIS — M25561 Pain in right knee: Secondary | ICD-10-CM | POA: Diagnosis not present

## 2017-06-16 DIAGNOSIS — R600 Localized edema: Secondary | ICD-10-CM | POA: Diagnosis not present

## 2017-06-19 DIAGNOSIS — R600 Localized edema: Secondary | ICD-10-CM | POA: Diagnosis not present

## 2017-06-19 DIAGNOSIS — M25561 Pain in right knee: Secondary | ICD-10-CM | POA: Diagnosis not present

## 2017-06-19 DIAGNOSIS — M1711 Unilateral primary osteoarthritis, right knee: Secondary | ICD-10-CM | POA: Diagnosis not present

## 2017-06-23 DIAGNOSIS — R609 Edema, unspecified: Secondary | ICD-10-CM | POA: Diagnosis not present

## 2017-06-23 DIAGNOSIS — M25561 Pain in right knee: Secondary | ICD-10-CM | POA: Diagnosis not present

## 2017-06-23 DIAGNOSIS — M1711 Unilateral primary osteoarthritis, right knee: Secondary | ICD-10-CM | POA: Diagnosis not present

## 2017-06-25 ENCOUNTER — Ambulatory Visit (INDEPENDENT_AMBULATORY_CARE_PROVIDER_SITE_OTHER): Payer: Medicare HMO

## 2017-06-25 ENCOUNTER — Encounter: Payer: Self-pay | Admitting: Physician Assistant

## 2017-06-25 ENCOUNTER — Ambulatory Visit (INDEPENDENT_AMBULATORY_CARE_PROVIDER_SITE_OTHER): Payer: Medicare HMO | Admitting: Physician Assistant

## 2017-06-25 VITALS — BP 148/82 | HR 76 | Temp 98.6°F | Ht 63.0 in | Wt 236.0 lb

## 2017-06-25 VITALS — BP 154/84 | HR 76 | Temp 99.6°F | Ht 63.0 in | Wt 236.0 lb

## 2017-06-25 DIAGNOSIS — Z0000 Encounter for general adult medical examination without abnormal findings: Secondary | ICD-10-CM

## 2017-06-25 DIAGNOSIS — I10 Essential (primary) hypertension: Secondary | ICD-10-CM

## 2017-06-25 DIAGNOSIS — Z1159 Encounter for screening for other viral diseases: Secondary | ICD-10-CM | POA: Diagnosis not present

## 2017-06-25 MED ORDER — LISINOPRIL 20 MG PO TABS: 20.0000 mg | ORAL_TABLET | Freq: Every day | ORAL | 0 refills | Status: DC

## 2017-06-25 NOTE — Patient Instructions (Signed)
Erin Romero , Thank you for taking time to come for your Medicare Wellness Visit. I appreciate your ongoing commitment to your health goals. Please review the following plan we discussed and let me know if I can assist you in the future.   Screening recommendations/referrals: Colonoscopy: completed 10/16/15, due 09/2025 Mammogram: completed 09/15/16, due 08/2017 Bone Density: completed 06/05/15 Recommended yearly ophthalmology/optometry visit for glaucoma screening and checkup Recommended yearly dental visit for hygiene and checkup  Vaccinations: Influenza vaccine: due 08/2017 Pneumococcal vaccine: completed series Tdap vaccine: declined Shingles vaccine: declined  Advanced directives: Please bring a copy of your POA (Power of Attorney) and/or Living Will to your next appointment.   Conditions/risks identified: Obesity; Recommend increasing water intake to 4 glasses of water a day.   Next appointment: None, need to schedule 1 year AWV.   Preventive Care 87 Years and Older, Female Preventive care refers to lifestyle choices and visits with your health care provider that can promote health and wellness. What does preventive care include?  A yearly physical exam. This is also called an annual well check.  Dental exams once or twice a year.  Routine eye exams. Ask your health care provider how often you should have your eyes checked.  Personal lifestyle choices, including:  Daily care of your teeth and gums.  Regular physical activity.  Eating a healthy diet.  Avoiding tobacco and drug use.  Limiting alcohol use.  Practicing safe sex.  Taking low-dose aspirin every day.  Taking vitamin and mineral supplements as recommended by your health care provider. What happens during an annual well check? The services and screenings done by your health care provider during your annual well check will depend on your age, overall health, lifestyle risk factors, and family history of  disease. Counseling  Your health care provider may ask you questions about your:  Alcohol use.  Tobacco use.  Drug use.  Emotional well-being.  Home and relationship well-being.  Sexual activity.  Eating habits.  History of falls.  Memory and ability to understand (cognition).  Work and work Statistician.  Reproductive health. Screening  You may have the following tests or measurements:  Height, weight, and BMI.  Blood pressure.  Lipid and cholesterol levels. These may be checked every 5 years, or more frequently if you are over 24 years old.  Skin check.  Lung cancer screening. You may have this screening every year starting at age 67 if you have a 30-pack-year history of smoking and currently smoke or have quit within the past 15 years.  Fecal occult blood test (FOBT) of the stool. You may have this test every year starting at age 46.  Flexible sigmoidoscopy or colonoscopy. You may have a sigmoidoscopy every 5 years or a colonoscopy every 10 years starting at age 67.  Hepatitis C blood test.  Hepatitis B blood test.  Sexually transmitted disease (STD) testing.  Diabetes screening. This is done by checking your blood sugar (glucose) after you have not eaten for a while (fasting). You may have this done every 1-3 years.  Bone density scan. This is done to screen for osteoporosis. You may have this done starting at age 62.  Mammogram. This may be done every 1-2 years. Talk to your health care provider about how often you should have regular mammograms. Talk with your health care provider about your test results, treatment options, and if necessary, the need for more tests. Vaccines  Your health care provider may recommend certain vaccines, such as:  Influenza vaccine. This is recommended every year.  Tetanus, diphtheria, and acellular pertussis (Tdap, Td) vaccine. You may need a Td booster every 10 years.  Zoster vaccine. You may need this after age  7.  Pneumococcal 13-valent conjugate (PCV13) vaccine. One dose is recommended after age 54.  Pneumococcal polysaccharide (PPSV23) vaccine. One dose is recommended after age 41. Talk to your health care provider about which screenings and vaccines you need and how often you need them. This information is not intended to replace advice given to you by your health care provider. Make sure you discuss any questions you have with your health care provider. Document Released: 01/04/2016 Document Revised: 08/27/2016 Document Reviewed: 10/09/2015 Elsevier Interactive Patient Education  2017 [place] Prevention in the Home Falls can cause injuries. They can happen to people of all ages. There are many things you can do to make your home safe and to help prevent falls. What can I do on the outside of my home?  Regularly fix the edges of walkways and driveways and fix any cracks.  Remove anything that might make you trip as you walk through a door, such as a raised step or threshold.  Trim any bushes or trees on the path to your home.  Use bright outdoor lighting.  Clear any walking paths of anything that might make someone trip, such as rocks or tools.  Regularly check to see if handrails are loose or broken. Make sure that both sides of any steps have handrails.  Any raised decks and porches should have guardrails on the edges.  Have any leaves, snow, or ice cleared regularly.  Use sand or salt on walking paths during winter.  Clean up any spills in your garage right away. This includes oil or grease spills. What can I do in the bathroom?  Use night lights.  Install grab bars by the toilet and in the tub and shower. Do not use towel bars as grab bars.  Use non-skid mats or decals in the tub or shower.  If you need to sit down in the shower, use a plastic, non-slip stool.  Keep the floor dry. Clean up any water that spills on the floor as soon as it happens.  Remove  soap buildup in the tub or shower regularly.  Attach bath mats securely with double-sided non-slip rug tape.  Do not have throw rugs and other things on the floor that can make you trip. What can I do in the bedroom?  Use night lights.  Make sure that you have a light by your bed that is easy to reach.  Do not use any sheets or blankets that are too big for your bed. They should not hang down onto the floor.  Have a firm chair that has side arms. You can use this for support while you get dressed.  Do not have throw rugs and other things on the floor that can make you trip. What can I do in the kitchen?  Clean up any spills right away.  Avoid walking on wet floors.  Keep items that you use a lot in easy-to-reach places.  If you need to reach something above you, use a strong step stool that has a grab bar.  Keep electrical cords out of the way.  Do not use floor polish or wax that makes floors slippery. If you must use wax, use non-skid floor wax.  Do not have throw rugs and other things on the floor that  can make you trip. What can I do with my stairs?  Do not leave any items on the stairs.  Make sure that there are handrails on both sides of the stairs and use them. Fix handrails that are broken or loose. Make sure that handrails are as long as the stairways.  Check any carpeting to make sure that it is firmly attached to the stairs. Fix any carpet that is loose or worn.  Avoid having throw rugs at the top or bottom of the stairs. If you do have throw rugs, attach them to the floor with carpet tape.  Make sure that you have a light switch at the top of the stairs and the bottom of the stairs. If you do not have them, ask someone to add them for you. What else can I do to help prevent falls?  Wear shoes that:  Do not have high heels.  Have rubber bottoms.  Are comfortable and fit you well.  Are closed at the toe. Do not wear sandals.  If you use a  stepladder:  Make sure that it is fully opened. Do not climb a closed stepladder.  Make sure that both sides of the stepladder are locked into place.  Ask someone to hold it for you, if possible.  Clearly mark and make sure that you can see:  Any grab bars or handrails.  First and last steps.  Where the edge of each step is.  Use tools that help you move around (mobility aids) if they are needed. These include:  Canes.  Walkers.  Scooters.  Crutches.  Turn on the lights when you go into a dark area. Replace any light bulbs as soon as they burn out.  Set up your furniture so you have a clear path. Avoid moving your furniture around.  If any of your floors are uneven, fix them.  If there are any pets around you, be aware of where they are.  Review your medicines with your doctor. Some medicines can make you feel dizzy. This can increase your chance of falling. Ask your doctor what other things that you can do to help prevent falls. This information is not intended to replace advice given to you by your health care provider. Make sure you discuss any questions you have with your health care provider. Document Released: 10/04/2009 Document Revised: 05/15/2016 Document Reviewed: 01/12/2015 Elsevier Interactive Patient Education  2017 [employer].

## 2017-06-25 NOTE — Patient Instructions (Signed)
Can take two 10 mg Lisinopril daily until you start your Mail order prescription   Hypertension Hypertension is another name for high blood pressure. High blood pressure forces your heart to work harder to pump blood. This can cause problems over time. There are two numbers in a blood pressure reading. There is a top number (systolic) over a bottom number (diastolic). It is best to have a blood pressure below 120/80. Healthy choices can help lower your blood pressure. You may need medicine to help lower your blood pressure if:  Your blood pressure cannot be lowered with healthy choices.  Your blood pressure is higher than 130/80.  Follow these instructions at home: Eating and drinking  If directed, follow the DASH eating plan. This diet includes: ? Filling half of your plate at each meal with fruits and vegetables. ? Filling one quarter of your plate at each meal with whole grains. Whole grains include whole wheat pasta, brown rice, and whole grain bread. ? Eating or drinking low-fat dairy products, such as skim milk or low-fat yogurt. ? Filling one quarter of your plate at each meal with low-fat (lean) proteins. Low-fat proteins include fish, skinless chicken, eggs, beans, and tofu. ? Avoiding fatty meat, cured and processed meat, or chicken with skin. ? Avoiding premade or processed food.  Eat less than 1,500 mg of salt (sodium) a day.  Limit alcohol use to no more than 1 drink a day for nonpregnant women and 2 drinks a day for men. One drink equals 12 oz of beer, 5 oz of wine, or 1 oz of hard liquor. Lifestyle  Work with your doctor to stay at a healthy weight or to lose weight. Ask your doctor what the best weight is for you.  Get at least 30 minutes of exercise that causes your heart to beat faster (aerobic exercise) most days of the week. This may include walking, swimming, or biking.  Get at least 30 minutes of exercise that strengthens your muscles (resistance exercise) at  least 3 days a week. This may include lifting weights or pilates.  Do not use any products that contain nicotine or tobacco. This includes cigarettes and e-cigarettes. If you need help quitting, ask your doctor.  Check your blood pressure at home as told by your doctor.  Keep all follow-up visits as told by your doctor. This is important. Medicines  Take over-the-counter and prescription medicines only as told by your doctor. Follow directions carefully.  Do not skip doses of blood pressure medicine. The medicine does not work as well if you skip doses. Skipping doses also puts you at risk for problems.  Ask your doctor about side effects or reactions to medicines that you should watch for. Contact a doctor if:  You think you are having a reaction to the medicine you are taking.  You have headaches that keep coming back (recurring).  You feel dizzy.  You have swelling in your ankles.  You have trouble with your vision. Get help right away if:  You get a very bad headache.  You start to feel confused.  You feel weak or numb.  You feel faint.  You get very bad pain in your: ? Chest. ? Belly (abdomen).  You throw up (vomit) more than once.  You have trouble breathing. Summary  Hypertension is another name for high blood pressure.  Making healthy choices can help lower blood pressure. If your blood pressure cannot be controlled with healthy choices, you may need to take  medicine. This information is not intended to replace advice given to you by your health care provider. Make sure you discuss any questions you have with your health care provider. Document Released: 05/26/2008 Document Revised: 11/05/2016 Document Reviewed: 11/05/2016 Elsevier Interactive Patient Education  [employer].

## 2017-06-25 NOTE — Progress Notes (Signed)
Subjective:   Erin Romero is a 66 y.o. female who presents for Medicare Annual (Subsequent) preventive examination.  Review of Systems:  N/A  Cardiac Risk Factors include: advanced age (>31men, >32 women);dyslipidemia;hypertension;obesity (BMI >30kg/m2)     Objective:     Vitals: BP (!) 174/90 (BP Location: Left Arm)   Pulse 76   Temp 99.6 F (37.6 C) (Oral)   Ht 5\' 3"  (1.6 m)   Wt 236 lb (107 kg)   BMI 41.81 kg/m   Body mass index is 41.81 kg/m.   Tobacco History  Smoking Status  . Never Smoker  Smokeless Tobacco  . Never Used     Counseling given: Not Answered   Past Medical History:  Diagnosis Date  . Anxiety   . Atrial fibrillation ([place]) 2014  . Breast cancer ([place])    right breast  . Cancer ([place])    Breast   . Fibrocystic breast disease 2005  . GERD (gastroesophageal reflux disease)   . Hypertension 2005  . Lump or mass in breast 2014   right mastectomy  . Malignant neoplasm of central portion of female breast ([place]) January 24, 2013   stage II,T2, N1a. Mastectomy with sentinel node biopsy. 2 sentinel nodes negative, non-sentinel node w/ 5 mm macrometastatic foci in non-sentinel node.  Received post mastectomy radiation.    Past Surgical History:  Procedure Laterality Date  . ABDOMINAL HYSTERECTOMY  1999  . BREAST BIOPSY Left 2015   Negative  . BREAST SURGERY Right 2005,2014   biopsy  . BREAST SURGERY Right 2014   mastectomy  . COLONOSCOPY  2002  . COLONOSCOPY WITH PROPOFOL N/A 10/16/2015   Procedure: COLONOSCOPY WITH PROPOFOL;  Surgeon: Robert Bellow, MD;  Location: [facility] ENDOSCOPY;  Service: Endoscopy;  Laterality: N/A;  . KNEE SURGERY Left 2005  . MASTECTOMY Right 2014   radiation chemo  . thumb surg Right 2004  . TONSILLECTOMY     age of 26   Family History  Problem Relation Age of Onset  . Heart disease Father   . Hypertension Sister   . Cancer Mother        cervical  . Cancer Other        ovarian,liver,colon cancer-no  family member listed  . Osteoporosis Paternal Aunt   . Osteoporosis Paternal Grandmother    History  Sexual Activity  . Sexual activity: Not on file    Outpatient Encounter Prescriptions as of 06/25/2017  Medication Sig  . acetaminophen (TYLENOL) 500 MG tablet Take 500-1,000 mg by mouth every 4 (four) hours as needed for pain (q 4-6 prn).  . Ascorbic Acid (VITAMIN C) 1000 MG tablet Take 500 mg by mouth daily.   Marland Kitchen aspirin 81 MG chewable tablet Chew 81 mg by mouth daily.  . bisoprolol-hydrochlorothiazide (ZIAC) 10-6.25 MG tablet Take 1 tablet by mouth daily.  . Calcium Carb-Cholecalciferol (OYSTER SHELL CALCIUM + D PO) Take 1 tablet by mouth daily.   . Cholecalciferol (VITAMIN D-3 PO) Take 1,000 Units by mouth daily.   Marland Kitchen docusate sodium (COLACE) 100 MG capsule Take 100 mg by mouth daily.   Marland Kitchen escitalopram (LEXAPRO) 10 MG tablet TAKE 1 TABLET EVERY DAY  . hydrochlorothiazide (HYDRODIURIL) 12.5 MG tablet TAKE 1 TABLET EVERY DAY  . letrozole (FEMARA) 2.5 MG tablet TAKE 1 TABLET EVERY DAY  . lisinopril (PRINIVIL,ZESTRIL) 10 MG tablet Take 1 tablet (10 mg total) by mouth daily.  Marland Kitchen omeprazole (PRILOSEC OTC) 20 MG tablet Take 20 mg by mouth daily.   Marland Kitchen  Potassium Chloride ER 20 MEQ TBCR TAKE 1 TABLET (20 MEQ TOTAL) BY MOUTH DAILY.  . traZODone (DESYREL) 50 MG tablet Take 1 tablet (50 mg total) by mouth daily.  Marland Kitchen warfarin (COUMADIN) 2 MG tablet Take 2 mg by mouth daily.  Marland Kitchen PNEUMOVAX 23 25 MCG/0.5ML injection inject 0.5 milliliter intramuscularly  . [DISCONTINUED] FLUZONE HIGH-DOSE 0.5 ML SUSY inject 0.5 milliliter intramuscularly   No facility-administered encounter medications on file as of 06/25/2017.     Activities of Daily Living In your present state of health, do you have any difficulty performing the following activities: 06/25/2017 11/05/2016  Hearing? N N  Vision? Y Y  Difficulty concentrating or making decisions? N N  Walking or climbing stairs? Y Y  Dressing or bathing? N N  Doing  errands, shopping? N N  Preparing Food and eating ? N -  Using the Toilet? N -  In the past six months, have you accidently leaked urine? N -  Do you have problems with loss of bowel control? N -  Managing your Medications? N -  Managing your Finances? N -  Housekeeping or managing your Housekeeping? N -  Some recent data might be hidden    Patient Care Team: Paulene Floor as PCP - General (Physician Assistant) Bary Castilla Forest Gleason, MD as Consulting Physician (General Surgery) Teodoro Spray, MD as Consulting Physician (Cardiology) Lequita Asal, MD as Referring Physician (Hematology and Oncology) Thornton Park, MD as Referring Physician (Orthopedic Surgery)    Assessment:     Exercise Activities and Dietary recommendations Current Exercise Habits: The patient does not participate in regular exercise at present, Exercise limited by: orthopedic condition(s)  Goals    . Exercise 150 minutes per week (moderate activity)    . Increase water intake          Recommend increasing water intake to 4 glasses of water a day.       Fall Risk Fall Risk  06/25/2017 11/05/2016 08/22/2015 07/26/2015  Falls in the past year? No No No No   Depression Screen PHQ 2/9 Scores 06/25/2017 05/21/2017 11/05/2016 11/05/2016  PHQ - 2 Score 0 1 0 0  PHQ- 9 Score 1 5 - -     Cognitive Function     6CIT Screen 06/25/2017  What Year? 0 points  What month? 0 points  What time? 0 points  Count back from 20 0 points  Months in reverse 0 points  Repeat phrase 4 points  Total Score 4    Immunization History  Administered Date(s) Administered  . Influenza,inj,Quad PF,36+ Mos 09/06/2015  . Pneumococcal Conjugate-13 08/22/2015  . Pneumococcal Polysaccharide-23 09/03/2016   Screening Tests Health Maintenance  Topic Date Due  . TETANUS/TDAP  12/22/2026 (Originally 03/23/1970)  . INFLUENZA VACCINE  07/22/2017  . MAMMOGRAM  09/15/2018  . COLONOSCOPY  10/15/2025  . DEXA SCAN  Completed    . Hepatitis C Screening  Completed  . PNA vac Low Risk Adult  Completed      Plan:  I have personally reviewed and addressed the Medicare Annual Wellness questionnaire and have noted the following in the patient's chart:  A. Medical and social history B. Use of alcohol, tobacco or illicit drugs  C. Current medications and supplements D. Functional ability and status E.  Nutritional status F.  Physical activity G. Advance directives H. List of other physicians I.  Hospitalizations, surgeries, and ER visits in previous 12 months J.  Vitals K. Screenings such as hearing and  vision if needed, cognitive and depression L. Referrals and appointments - none  In addition, I have reviewed and discussed with patient certain preventive protocols, quality metrics, and best practice recommendations. A written personalized care plan for preventive services as well as general preventive health recommendations were provided to patient.  See attached scanned questionnaire for additional information.   Signed,  Fabio Neighbors, LPN Nurse Health Advisor   MD Recommendations: None, pt declined tetanus vaccine today.

## 2017-06-25 NOTE — Progress Notes (Signed)
Patient: Erin Romero Female    DOB: 06-03-1951   66 y.o.   MRN: [id number] Visit Date: 06/25/2017  Today's Provider: Trinna Post, PA-C   Chief Complaint  Patient presents with  . Hypertension    Follow up   Subjective:    Hypertension  This is a chronic problem. Condition status: Pt is not checking her blood pressure at home. Associated symptoms include anxiety and shortness of breath. Pertinent negatives include no blurred vision, chest pain, headaches, malaise/fatigue, neck pain, orthopnea, palpitations, peripheral edema, PND or sweats.    Last visit she was restarted on lisinopril 10 mg. She is tolerating this well. She has some dizziness when standing but not above baseline. She is taking it every day.     Allergies  Allergen Reactions  . Taxotere [Docetaxel] Itching and Rash  . Sulfa Antibiotics Hives     Current Outpatient Prescriptions:  .  acetaminophen (TYLENOL) 500 MG tablet, Take 500-1,000 mg by mouth every 4 (four) hours as needed for pain (q 4-6 prn)., Disp: , Rfl:  .  Ascorbic Acid (VITAMIN C) 1000 MG tablet, Take 500 mg by mouth daily. , Disp: , Rfl:  .  aspirin 81 MG chewable tablet, Chew 81 mg by mouth daily., Disp: , Rfl:  .  bisoprolol-hydrochlorothiazide (ZIAC) 10-6.25 MG tablet, Take 1 tablet by mouth daily., Disp: 90 tablet, Rfl: 1 .  Calcium Carb-Cholecalciferol (OYSTER SHELL CALCIUM + D PO), Take 1 tablet by mouth daily. , Disp: , Rfl:  .  Cholecalciferol (VITAMIN D-3 PO), Take 1,000 Units by mouth daily. , Disp: , Rfl:  .  docusate sodium (COLACE) 100 MG capsule, Take 100 mg by mouth daily. , Disp: , Rfl:  .  escitalopram (LEXAPRO) 10 MG tablet, TAKE 1 TABLET EVERY DAY, Disp: 90 tablet, Rfl: 4 .  hydrochlorothiazide (HYDRODIURIL) 12.5 MG tablet, TAKE 1 TABLET EVERY DAY, Disp: 90 tablet, Rfl: 4 .  letrozole (FEMARA) 2.5 MG tablet, TAKE 1 TABLET EVERY DAY, Disp: 90 tablet, Rfl: 0 .  lisinopril (PRINIVIL,ZESTRIL) 10 MG tablet, Take 1  tablet (10 mg total) by mouth daily., Disp: 90 tablet, Rfl: 0 .  omeprazole (PRILOSEC OTC) 20 MG tablet, Take 20 mg by mouth daily. , Disp: , Rfl:  .  PNEUMOVAX 23 25 MCG/0.5ML injection, inject 0.5 milliliter intramuscularly, Disp: , Rfl: 0 .  Potassium Chloride ER 20 MEQ TBCR, TAKE 1 TABLET (20 MEQ TOTAL) BY MOUTH DAILY., Disp: 90 tablet, Rfl: 1 .  traZODone (DESYREL) 50 MG tablet, Take 1 tablet (50 mg total) by mouth daily., Disp: 90 tablet, Rfl: 1 .  warfarin (COUMADIN) 2 MG tablet, Take 2 mg by mouth daily., Disp: , Rfl:   Review of Systems  Constitutional: Positive for fatigue. Negative for malaise/fatigue.  HENT: Positive for tinnitus and trouble swallowing.   Eyes: Negative.  Negative for blurred vision.  Respiratory: Positive for shortness of breath.   Cardiovascular: Negative.  Negative for chest pain, palpitations, orthopnea and PND.  Gastrointestinal: Negative.   Endocrine: Negative.   Genitourinary: Negative.   Musculoskeletal: Negative.  Negative for neck pain.  Skin: Negative.   Allergic/Immunologic: Negative.   Neurological: Positive for dizziness and light-headedness. Negative for headaches.  Hematological: Bruises/bleeds easily.  Psychiatric/Behavioral: The patient is nervous/anxious.     Social History  Substance Use Topics  . Smoking status: Never Smoker  . Smokeless tobacco: Never Used  . Alcohol use No   Objective:   BP (!) 148/82 (BP  Location: Left Arm, Patient Position: Sitting, Cuff Size: Large)   Pulse 76   Temp 98.6 F (37 C) (Oral)   Ht 5\' 3"  (1.6 m)   Wt 236 lb (107 kg)   BMI 41.81 kg/m  Vitals:   06/25/17 1114  BP: (!) 148/82  Pulse: 76  Temp: 98.6 F (37 C)  TempSrc: Oral  Weight: 236 lb (107 kg)  Height: 5\' 3"  (1.6 m)     Physical Exam  Constitutional: She is oriented to person, place, and time. She appears well-developed and well-nourished.  Cardiovascular: Normal rate and regular rhythm.   Pulmonary/Chest: Effort normal and  breath sounds normal.  Musculoskeletal: She exhibits no edema.  Neurological: She is alert and oriented to person, place, and time.  Skin: Skin is warm and dry.  Psychiatric: She has a normal mood and affect. Her behavior is normal.        Assessment & Plan:     1. Essential hypertension  Improved today but still high. Will increase to her former dose of 20 mg.   - lisinopril (PRINIVIL,ZESTRIL) 20 MG tablet; Take 1 tablet (20 mg total) by mouth daily.  Dispense: 90 tablet; Refill: 0  Return in about 4 weeks (around 07/23/2017) for BP check, increased Lisinopril.  The entirety of the information documented in the History of Present Illness, Review of Systems and Physical Exam were personally obtained by me. Portions of this information were initially documented by Ashley Royalty, CMA and reviewed by me for thoroughness and accuracy.         Trinna Post, PA-C  [facility]

## 2017-06-26 DIAGNOSIS — M25561 Pain in right knee: Secondary | ICD-10-CM | POA: Diagnosis not present

## 2017-06-26 DIAGNOSIS — R609 Edema, unspecified: Secondary | ICD-10-CM | POA: Diagnosis not present

## 2017-06-26 DIAGNOSIS — M1711 Unilateral primary osteoarthritis, right knee: Secondary | ICD-10-CM | POA: Diagnosis not present

## 2017-06-26 LAB — HEPATITIS C ANTIBODY: Hep C Virus Ab: <0.1 s/co ratio (ref 0.0–0.9)

## 2017-06-30 DIAGNOSIS — M25561 Pain in right knee: Secondary | ICD-10-CM | POA: Diagnosis not present

## 2017-06-30 DIAGNOSIS — R609 Edema, unspecified: Secondary | ICD-10-CM | POA: Diagnosis not present

## 2017-06-30 DIAGNOSIS — M1711 Unilateral primary osteoarthritis, right knee: Secondary | ICD-10-CM | POA: Diagnosis not present

## 2017-07-02 DIAGNOSIS — I480 Paroxysmal atrial fibrillation: Secondary | ICD-10-CM | POA: Diagnosis not present

## 2017-07-03 DIAGNOSIS — R609 Edema, unspecified: Secondary | ICD-10-CM | POA: Diagnosis not present

## 2017-07-03 DIAGNOSIS — M1711 Unilateral primary osteoarthritis, right knee: Secondary | ICD-10-CM | POA: Diagnosis not present

## 2017-07-03 DIAGNOSIS — M25561 Pain in right knee: Secondary | ICD-10-CM | POA: Diagnosis not present

## 2017-07-06 ENCOUNTER — Inpatient Hospital Stay: Payer: Medicare HMO

## 2017-07-06 ENCOUNTER — Inpatient Hospital Stay: Payer: Medicare HMO | Admitting: Hematology and Oncology

## 2017-07-28 ENCOUNTER — Ambulatory Visit (INDEPENDENT_AMBULATORY_CARE_PROVIDER_SITE_OTHER): Payer: Medicare HMO | Admitting: Physician Assistant

## 2017-07-28 VITALS — BP 146/84 | HR 74 | Temp 98.4°F | Resp 16 | Wt 233.0 lb

## 2017-07-28 DIAGNOSIS — I10 Essential (primary) hypertension: Secondary | ICD-10-CM

## 2017-07-28 DIAGNOSIS — R12 Heartburn: Secondary | ICD-10-CM

## 2017-07-28 MED ORDER — AMLODIPINE BESYLATE 2.5 MG PO TABS: 2.5000 mg | ORAL_TABLET | Freq: Every day | ORAL | 0 refills | Status: DC

## 2017-07-28 MED ORDER — AMLODIPINE BESYLATE 2.5 MG PO TABS: 2.5000 mg | ORAL_TABLET | Freq: Every day | ORAL | 3 refills | Status: DC

## 2017-07-28 MED ORDER — OMEPRAZOLE MAGNESIUM 20 MG PO TBEC: 20.0000 mg | DELAYED_RELEASE_TABLET | Freq: Every day | ORAL | 0 refills | Status: DC

## 2017-07-28 NOTE — Progress Notes (Signed)
Erin Romero  MRN: [id number] DOB: [birth year]  Subjective:  HPI   The patient is a 66 year old female who presents for follow up of her hypertension.  She was last seen on 06/25/17 and her pressure was 148/82.  She was   Started on Lisinopril 10 mg and instructed to go up to 20 mg last month.  She is currently on 20 mg Lisinopril, 12.5 HCTZ, and Ziac 10-6.5 mg and does not check her pressure at home. She denies CP, SOB. She does have some leg welling today in her right leg that she previously injured and has been doing physical therapy for. She denies pain, erythema, or above cardiopulmonary symptoms.  Call mail order pharmacy and change Patient Active Problem List   Diagnosis Date Noted  . Candida infection 05/26/2016  . Hypokalemia 02/04/2016  . Cystitis 07/26/2015  . Osteopenia 06/05/2015  . Adaptation reaction 05/10/2015  . Anxiety 05/10/2015  . Arthritis 05/10/2015  . Esophageal tissue web 05/10/2015  . Genital herpes 05/10/2015  . Bloodgood disease 05/10/2015  . Blood glucose elevated 05/10/2015  . BP (high blood pressure) 05/10/2015  . Insomnia 05/10/2015  . Headache, migraine 05/10/2015  . Avitaminosis D 05/10/2015  . Rash and nonspecific skin eruption 04/04/2015  . Clinical depression 10/02/2014  . Hypercholesteremia 10/02/2014  . A-fib ([place]) 04/18/2014  . Breast mass, left 02/27/2014  . Breast cancer, right breast ([place]) 09/12/2013  . Lump or mass in breast   . Malignant neoplasm of central portion of female breast [facility])     Past Medical History:  Diagnosis Date  . Anxiety   . Atrial fibrillation ([place]) 2014  . Breast cancer ([place])    right breast  . Cancer ([place])    Breast   . Fibrocystic breast disease 2005  . GERD (gastroesophageal reflux disease)   . Hypertension 2005  . Lump or mass in breast 2014   right mastectomy  . Malignant neoplasm of central portion of female breast ([place]) January 24, 2013   stage II,T2, N1a. Mastectomy with sentinel node  biopsy. 2 sentinel nodes negative, non-sentinel node w/ 5 mm macrometastatic foci in non-sentinel node.  Received post mastectomy radiation.     Social History   Social History  . Marital status: Widowed    Spouse name: Ericah Scotto  . Number of children: 0  . Years of education: 33   Occupational History  .  Unemployed    disabilty from breast cancer   Social History Main Topics  . Smoking status: Never Smoker  . Smokeless tobacco: Never Used  . Alcohol use No  . Drug use: No  . Sexual activity: Not on file   Other Topics Concern  . Not on file   Social History Narrative  . No narrative on file    Outpatient Encounter Prescriptions as of 07/28/2017  Medication Sig Note  . acetaminophen (TYLENOL) 500 MG tablet Take 500-1,000 mg by mouth every 4 (four) hours as needed for pain (q 4-6 prn).   . Ascorbic Acid (VITAMIN C) 1000 MG tablet Take 500 mg by mouth daily.    Marland Kitchen aspirin 81 MG chewable tablet Chew 81 mg by mouth daily.   . bisoprolol-hydrochlorothiazide (ZIAC) 10-6.25 MG tablet Take 1 tablet by mouth daily.   . Calcium Carb-Cholecalciferol (OYSTER SHELL CALCIUM + D PO) Take 1 tablet by mouth daily.    . Cholecalciferol (VITAMIN D-3 PO) Take 1,000 Units by mouth daily.    Marland Kitchen docusate sodium (COLACE) 100  MG capsule Take 100 mg by mouth daily.    Marland Kitchen escitalopram (LEXAPRO) 10 MG tablet TAKE 1 TABLET EVERY DAY   . hydrochlorothiazide (HYDRODIURIL) 12.5 MG tablet TAKE 1 TABLET EVERY DAY   . letrozole (FEMARA) 2.5 MG tablet TAKE 1 TABLET EVERY DAY   . lisinopril (PRINIVIL,ZESTRIL) 20 MG tablet Take 1 tablet (20 mg total) by mouth daily.   Marland Kitchen omeprazole (PRILOSEC OTC) 20 MG tablet Take 1 tablet (20 mg total) by mouth daily.   . Potassium Chloride ER 20 MEQ TBCR TAKE 1 TABLET (20 MEQ TOTAL) BY MOUTH DAILY.   . traZODone (DESYREL) 50 MG tablet Take 1 tablet (50 mg total) by mouth daily.   . [DISCONTINUED] omeprazole (PRILOSEC OTC) 20 MG tablet Take 20 mg by mouth daily.  05/10/2015:  Received from: [facility]  . amLODipine (NORVASC) 2.5 MG tablet Take 1 tablet (2.5 mg total) by mouth daily.   Marland Kitchen PNEUMOVAX 23 25 MCG/0.5ML injection inject 0.5 milliliter intramuscularly 09/22/2016: Received from: External Pharmacy  . warfarin (COUMADIN) 2 MG tablet Take 2 mg by mouth daily. 11/24/2016: Still taking it  . [DISCONTINUED] amLODipine (NORVASC) 2.5 MG tablet Take 1 tablet (2.5 mg total) by mouth daily.    No facility-administered encounter medications on file as of 07/28/2017.     Allergies  Allergen Reactions  . Taxotere [Docetaxel] Itching and Rash  . Sulfa Antibiotics Hives    Review of Systems  Constitutional: Positive for malaise/fatigue. Negative for fever.  Respiratory: Negative for cough, shortness of breath and wheezing.   Cardiovascular: Positive for palpitations (chronic and unchanged) and leg swelling. Negative for chest pain, orthopnea and claudication.  Neurological: Negative for weakness and headaches.    Objective:  BP (!) 146/84 (BP Location: Left Arm, Patient Position: Sitting, Cuff Size: Normal)   Pulse 74   Temp 98.4 F (36.9 C) (Oral)   Resp 16   Wt 233 lb (105.7 kg)   BMI 41.27 kg/m   Physical Exam  Constitutional: She is well-developed, well-nourished, and in no distress.  Cardiovascular: Normal rate, regular rhythm and intact distal pulses.   Pulmonary/Chest: Effort normal and breath sounds normal.  Musculoskeletal: She exhibits edema.  Right leg slightly more edematous than left to level of mid thigh. No tenderness with palpation of deep venous syndrome, no erythema or warmth.  Skin: Skin is warm and dry. No erythema.  Psychiatric: Affect and judgment normal.    Assessment and Plan :  1. Hypertension, unspecified type  Her BP remains uncontrolled. Will add back amlodipine and have her follow up in 4 mo. She has a visit with cardiology in the next 2 mo which can serve as her BP check. I'm not sure how she has separate Rx  for HCTZ, but she does and has a lot of extra pills so not yet willing to switch over to single combo pill. Instructed her when next refill request comes in from mail order to deny it and we can switch this just to combo pill.  - amLODipine (NORVASC) 2.5 MG tablet; Take 1 tablet (2.5 mg total) by mouth daily.  Dispense: 90 tablet; Refill: 3  2. Heartburn  Refilled today.  - omeprazole (PRILOSEC OTC) 20 MG tablet; Take 1 tablet (20 mg total) by mouth daily.  Dispense: 90 tablet; Refill: 0   Return in about 4 months (around 11/27/2017) for BP, heartburn.  The entirety of the information documented in the History of Present Illness, Review of Systems and Physical Exam were  personally obtained by me. Portions of this information were initially documented by Ashley Royalty, CMA and reviewed by me for thoroughness and accuracy.

## 2017-07-28 NOTE — Patient Instructions (Signed)

## 2017-07-29 ENCOUNTER — Other Ambulatory Visit: Payer: Self-pay | Admitting: Hematology and Oncology

## 2017-07-29 DIAGNOSIS — I480 Paroxysmal atrial fibrillation: Secondary | ICD-10-CM | POA: Diagnosis not present

## 2017-08-03 ENCOUNTER — Inpatient Hospital Stay (HOSPITAL_BASED_OUTPATIENT_CLINIC_OR_DEPARTMENT_OTHER): Payer: Medicare HMO | Admitting: Hematology and Oncology

## 2017-08-03 ENCOUNTER — Inpatient Hospital Stay: Payer: Medicare HMO | Attending: Hematology and Oncology

## 2017-08-03 ENCOUNTER — Encounter: Payer: Self-pay | Admitting: *Deleted

## 2017-08-03 ENCOUNTER — Encounter: Payer: Self-pay | Admitting: Hematology and Oncology

## 2017-08-03 VITALS — BP 144/84 | HR 83 | Temp 98.8°F | Wt 233.4 lb

## 2017-08-03 DIAGNOSIS — C50111 Malignant neoplasm of central portion of right female breast: Secondary | ICD-10-CM

## 2017-08-03 DIAGNOSIS — Z79811 Long term (current) use of aromatase inhibitors: Secondary | ICD-10-CM | POA: Insufficient documentation

## 2017-08-03 DIAGNOSIS — Z9013 Acquired absence of bilateral breasts and nipples: Secondary | ICD-10-CM | POA: Diagnosis not present

## 2017-08-03 DIAGNOSIS — I10 Essential (primary) hypertension: Secondary | ICD-10-CM | POA: Diagnosis not present

## 2017-08-03 DIAGNOSIS — Z7982 Long term (current) use of aspirin: Secondary | ICD-10-CM | POA: Insufficient documentation

## 2017-08-03 DIAGNOSIS — Z9221 Personal history of antineoplastic chemotherapy: Secondary | ICD-10-CM | POA: Insufficient documentation

## 2017-08-03 DIAGNOSIS — Z923 Personal history of irradiation: Secondary | ICD-10-CM | POA: Insufficient documentation

## 2017-08-03 DIAGNOSIS — Z170 Estrogen receptor positive status [ER+]: Secondary | ICD-10-CM

## 2017-08-03 DIAGNOSIS — I7381 Erythromelalgia: Secondary | ICD-10-CM | POA: Insufficient documentation

## 2017-08-03 DIAGNOSIS — L509 Urticaria, unspecified: Secondary | ICD-10-CM | POA: Insufficient documentation

## 2017-08-03 DIAGNOSIS — C50911 Malignant neoplasm of unspecified site of right female breast: Secondary | ICD-10-CM

## 2017-08-03 DIAGNOSIS — K219 Gastro-esophageal reflux disease without esophagitis: Secondary | ICD-10-CM | POA: Insufficient documentation

## 2017-08-03 DIAGNOSIS — F419 Anxiety disorder, unspecified: Secondary | ICD-10-CM | POA: Insufficient documentation

## 2017-08-03 DIAGNOSIS — Z7901 Long term (current) use of anticoagulants: Secondary | ICD-10-CM | POA: Diagnosis not present

## 2017-08-03 DIAGNOSIS — C50119 Malignant neoplasm of central portion of unspecified female breast: Secondary | ICD-10-CM

## 2017-08-03 DIAGNOSIS — M85852 Other specified disorders of bone density and structure, left thigh: Secondary | ICD-10-CM

## 2017-08-03 DIAGNOSIS — I4891 Unspecified atrial fibrillation: Secondary | ICD-10-CM | POA: Insufficient documentation

## 2017-08-03 LAB — CBC WITH DIFFERENTIAL/PLATELET
Basophils Absolute: 0 10*3/uL (ref 0–0.1)
Basophils Relative: 1 %
Eosinophils Absolute: 0.1 10*3/uL (ref 0–0.7)
Eosinophils Relative: 1 %
HCT: 39.6 % (ref 35.0–47.0)
Hemoglobin: 13.8 g/dL (ref 12.0–16.0)
Lymphocytes Relative: 23 %
Lymphs Abs: 2.1 10*3/uL (ref 1.0–3.6)
MCH: 29.3 pg (ref 26.0–34.0)
MCHC: 34.9 g/dL (ref 32.0–36.0)
MCV: 83.9 fL (ref 80.0–100.0)
Monocytes Absolute: 0.6 10*3/uL (ref 0.2–0.9)
Monocytes Relative: 7 %
Neutro Abs: 6.3 10*3/uL (ref 1.4–6.5)
Neutrophils Relative %: 68 %
Platelets: 221 10*3/uL (ref 150–440)
RBC: 4.71 MIL/uL (ref 3.80–5.20)
RDW: 16 % — ABNORMAL HIGH (ref 11.5–14.5)
WBC: 9.1 10*3/uL (ref 3.6–11.0)

## 2017-08-03 LAB — COMPREHENSIVE METABOLIC PANEL
ALT: 17 U/L (ref 14–54)
AST: 26 U/L (ref 15–41)
Albumin: 3.8 g/dL (ref 3.5–5.0)
Alkaline Phosphatase: 71 U/L (ref 38–126)
Anion gap: 11 (ref 5–15)
BUN: 14 mg/dL (ref 6–20)
CO2: 26 mmol/L (ref 22–32)
Calcium: 9.2 mg/dL (ref 8.9–10.3)
Chloride: 102 mmol/L (ref 101–111)
Creatinine, Ser: 0.78 mg/dL (ref 0.44–1.00)
GFR calc Af Amer: >60 mL/min (ref >60)
GFR calc non Af Amer: >60 mL/min (ref >60)
Glucose, Bld: 125 mg/dL — ABNORMAL HIGH (ref 65–99)
Potassium: 3.6 mmol/L (ref 3.5–5.1)
Sodium: 139 mmol/L (ref 135–145)
Total Bilirubin: 0.8 mg/dL (ref 0.3–1.2)
Total Protein: 7.4 g/dL (ref 6.5–8.1)

## 2017-08-03 NOTE — Progress Notes (Signed)
[facility] day:  08/03/17   Chief Complaint: Erin Romero is a 66 y.o. female with stage IIB right breast cancer who is seen for 8 month assessment.  HPI: The patient was last seen in the medical oncology clinic on 11/24/2016.  At that time, she was doing well on Femara.  Exam was stable.  CBC with diff, CMP, and CA27.29 (22) were normal.  During the interim, she notes some hot flashes today.  She is taking Femara.  She is taking new blood pressure medications.  She is stressed taking care of her mother-in-law.   Past Medical History:  Diagnosis Date  . Anxiety   . Atrial fibrillation ([place]) 2014  . Breast cancer ([place])    right breast  . Cancer ([place])    Breast   . Fibrocystic breast disease 2005  . GERD (gastroesophageal reflux disease)   . Hypertension 2005  . Lump or mass in breast 2014   right mastectomy  . Malignant neoplasm of central portion of female breast ([place]) January 24, 2013   stage II,T2, N1a. Mastectomy with sentinel node biopsy. 2 sentinel nodes negative, non-sentinel node w/ 5 mm macrometastatic foci in non-sentinel node.  Received post mastectomy radiation.     Past Surgical History:  Procedure Laterality Date  . ABDOMINAL HYSTERECTOMY  1999  . BREAST BIOPSY Left 2015   Negative  . BREAST SURGERY Right 2005,2014   biopsy  . BREAST SURGERY Right 2014   mastectomy  . COLONOSCOPY  2002  . COLONOSCOPY WITH PROPOFOL N/A 10/16/2015   Procedure: COLONOSCOPY WITH PROPOFOL;  Surgeon: Robert Bellow, MD;  Location: [facility] ENDOSCOPY;  Service: Endoscopy;  Laterality: N/A;  . KNEE SURGERY Left 2005  . MASTECTOMY Right 2014   radiation chemo  . thumb surg Right 2004  . TONSILLECTOMY     age of 25    Family History  Problem Relation Age of Onset  . Heart disease Father   . Hypertension Sister   . Cancer Mother        cervical  . Cancer Other        ovarian,liver,colon cancer-no family member listed  .  Osteoporosis Paternal Aunt   . Osteoporosis Paternal Grandmother     Social History:  reports that she has never smoked. She has never used smokeless tobacco. She reports that she does not drink alcohol or use drugs.  Her husband, Ludwig Clarks, died 05/31/15.  She takes care of her mother-in-law (she can not see and needs everything done for her).  She lives in [place].  The patient is alone today.  Allergies:  Allergies  Allergen Reactions  . Taxotere [Docetaxel] Itching and Rash  . Sulfa Antibiotics Hives    Current Medications: Current Outpatient Prescriptions  Medication Sig Dispense Refill  . acetaminophen (TYLENOL) 500 MG tablet Take 500-1,000 mg by mouth every 4 (four) hours as needed for pain (q 4-6 prn).    Marland Kitchen amLODipine (NORVASC) 2.5 MG tablet Take 1 tablet (2.5 mg total) by mouth daily. 90 tablet 3  . Ascorbic Acid (VITAMIN C) 1000 MG tablet Take 500 mg by mouth daily.     Marland Kitchen aspirin 81 MG chewable tablet Chew 81 mg by mouth daily.    . bisoprolol-hydrochlorothiazide (ZIAC) 10-6.25 MG tablet Take 1 tablet by mouth daily. 90 tablet 1  . Calcium Carb-Cholecalciferol (OYSTER SHELL CALCIUM + D PO) Take 1 tablet by mouth daily.     . Cholecalciferol (VITAMIN D-3  PO) Take 1,000 Units by mouth daily.     Marland Kitchen docusate sodium (COLACE) 100 MG capsule Take 100 mg by mouth daily.     Marland Kitchen escitalopram (LEXAPRO) 10 MG tablet TAKE 1 TABLET EVERY DAY 90 tablet 4  . hydrochlorothiazide (HYDRODIURIL) 12.5 MG tablet TAKE 1 TABLET EVERY DAY 90 tablet 4  . letrozole (FEMARA) 2.5 MG tablet TAKE 1 TABLET EVERY DAY 90 tablet 0  . lisinopril (PRINIVIL,ZESTRIL) 20 MG tablet Take 1 tablet (20 mg total) by mouth daily. 90 tablet 0  . omeprazole (PRILOSEC OTC) 20 MG tablet Take 1 tablet (20 mg total) by mouth daily. 90 tablet 0  . Potassium Chloride ER 20 MEQ TBCR TAKE 1 TABLET (20 MEQ TOTAL) BY MOUTH DAILY. 90 tablet 1  . traZODone (DESYREL) 50 MG tablet Take 1 tablet (50 mg total) by mouth daily. 90 tablet 1   . warfarin (COUMADIN) 2 MG tablet Take 2 mg by mouth daily.    Marland Kitchen PNEUMOVAX 23 25 MCG/0.5ML injection inject 0.5 milliliter intramuscularly  0   No current facility-administered medications for this visit.     Review of Systems:  GENERAL:  Feels "stressed".  No fevers or sweats.  Weight up 1 pound. PERFORMANCE STATUS (ECOG):  1 HEENT:  No visual changes, runny nose, sore throat, mouth sores or tenderness. Lungs: No shortness of breath or cough.  No hemoptysis. Cardiac:  No chest pain, palpitations, orthopnea, or PND. GI:  No nausea, vomiting, diarrhea, constipation, melena or hematochezia. GU:  No urgency, frequency, dysuria, or hematuria. Musculoskeletal:  No back pain.  No joint pain.  No muscle tenderness. Extremities:  No pain or swelling. Skin:  No rashes, ulcers or skin changes.  Uses Nystatin powder prn to prevent Candida infection.  Neuro:  No headache, numbness or weakness, balance or coordination issues. Endocrine:  Vasomoter symptoms.  No diabetes, thyroid issues, or night sweats.  Hot flashes. Psych:  Stress.  No anxiety or depression. Pain:  No focal pain. Review of systems:  All other systems reviewed and found to be negative.  Physical Exam: Blood pressure (!) 144/84, pulse 83, temperature 98.8 F (37.1 C), temperature source Tympanic, weight 233 lb 6 oz (105.9 kg). GENERAL:  Well developed, well nourished, woman sitting comfortably in the exam room in no acute distress.  She is tearful at times. MENTAL STATUS:  Alert and oriented to person, place and time. HEAD:  Short brown hair with graying.  Normocephalic, atraumatic, face symmetric, no Cushingoid features. EYES:  Glasses.  Blue/brown eyes.  Pupils equal round and reactive to light and accomodation.  No conjunctivitis or scleral icterus. ENT:  Oropharynx clear without lesion.  Tongue normal. Mucous membranes moist.  RESPIRATORY:  Clear to auscultation without rales, wheezes or rhonchi. CARDIOVASCULAR:  Regular rate  and rhythm without murmur, rub or gallop. BREAST:  Right mastectomy.  No erythema or nodularity.  Left breast with fibrocystic changes inferiorly.  No masses, skin changes or nipple discharge.  ABDOMEN:  Soft, non-tender, with active bowel sounds, and no appreciable  hepatosplenomegaly.  No masses. SKIN:  No rashes, ulcers or skin lesions.  EXTREMITIES: No edema, no skin discoloration or tenderness.  No palpable cords. LYMPH NODES: No palpable cervical, supraclavicular, axillary or inguinal adenopathy  NEUROLOGICAL: Unremarkable. PSYCH:  Appropriate.   Appointment on 08/03/2017  Component Date Value Ref Range Status  . WBC 08/03/2017 9.1  3.6 - 11.0 K/uL Final  . RBC 08/03/2017 4.71  3.80 - 5.20 MIL/uL Final  . Hemoglobin 08/03/2017  13.8  12.0 - 16.0 g/dL Final  . HCT 08/03/2017 39.6  35.0 - 47.0 % Final  . MCV 08/03/2017 83.9  80.0 - 100.0 fL Final  . MCH 08/03/2017 29.3  26.0 - 34.0 pg Final  . MCHC 08/03/2017 34.9  32.0 - 36.0 g/dL Final  . RDW 08/03/2017 16.0* 11.5 - 14.5 % Final  . Platelets 08/03/2017 221  150 - 440 K/uL Final  . Neutrophils Relative % 08/03/2017 68  % Final  . Neutro Abs 08/03/2017 6.3  1.4 - 6.5 K/uL Final  . Lymphocytes Relative 08/03/2017 23  % Final  . Lymphs Abs 08/03/2017 2.1  1.0 - 3.6 K/uL Final  . Monocytes Relative 08/03/2017 7  % Final  . Monocytes Absolute 08/03/2017 0.6  0.2 - 0.9 K/uL Final  . Eosinophils Relative 08/03/2017 1  % Final  . Eosinophils Absolute 08/03/2017 0.1  0 - 0.7 K/uL Final  . Basophils Relative 08/03/2017 1  % Final  . Basophils Absolute 08/03/2017 0.0  0 - 0.1 K/uL Final  . Sodium 08/03/2017 139  135 - 145 mmol/L Final  . Potassium 08/03/2017 3.6  3.5 - 5.1 mmol/L Final  . Chloride 08/03/2017 102  101 - 111 mmol/L Final  . CO2 08/03/2017 26  22 - 32 mmol/L Final  . Glucose, Bld 08/03/2017 125* 65 - 99 mg/dL Final  . BUN 08/03/2017 14  6 - 20 mg/dL Final  . Creatinine, Ser 08/03/2017 0.78  0.44 - 1.00 mg/dL Final  .  Calcium 08/03/2017 9.2  8.9 - 10.3 mg/dL Final  . Total Protein 08/03/2017 7.4  6.5 - 8.1 g/dL Final  . Albumin 08/03/2017 3.8  3.5 - 5.0 g/dL Final  . AST 08/03/2017 26  15 - 41 U/L Final  . ALT 08/03/2017 17  14 - 54 U/L Final  . Alkaline Phosphatase 08/03/2017 71  38 - 126 U/L Final  . Total Bilirubin 08/03/2017 0.8  0.3 - 1.2 mg/dL Final  . GFR calc non Af Amer 08/03/2017 >60  >60 mL/min Final  . GFR calc Af Amer 08/03/2017 >60  >60 mL/min Final   Comment: (NOTE) The eGFR has been calculated using the CKD EPI equation. This calculation has not been validated in all clinical situations. eGFR's persistently <60 mL/min signify possible Chronic Kidney Disease.   . Anion gap 08/03/2017 11  5 - 15 Final    Assessment:  Carloyn Manner Cutler is a 66 y.o. female with a history of stage IIB (T2N1M0) right breast cancer status post radical mastectomy on 01/24/2013. Pathology revealed a 3 cm grade II invasive mammary carcinoma.  Zero of 2 sentinel lymph nodes were positive.  A non-sentinel lymph node was positive for a 0.5 cm macrometastasis with extracapsular extension.  DCIS was present. Estrogen receptor was> 90%, progesterone receptor greater than 90%, and HER-2/neu negative.  She is s/p 3 cycles of Taxotere and Cytoxan (03/08/2013 - 04/20/2013). Treatment was complicated by erythromelalgia and an urticarial reaction. She did not receive any further chemotherapy.  She received radiation from 05/30/2013 until 07/19/2013. She began Femara.  Mammogram on 02/06/2014 was suspicious for left breast mass.  Left breast biopsy on 02/27/2014 was negative for malignancy.  Left-sided mammogram on 09/15/2016 was negative.    CA27.29 was 24.9 on 12/20/2014, 28.8 on 05/22/2015, 26.3 on 11/26/2015, 31.3 on 05/26/2016, 22.0 on 11/24/2016, and 23.5 on 08/03/2017.  Bone density study on 06/05/2015 revealed osteopenia with a T score of -1.9 in the left femoral neck.  Patient is on  calcium and vitamin D.  Patient  declined Prolia and Fosamax.  Symptomatically, she is doing well.  Exam is stable.  Plan: 1.  Labs today:  CBC with diff, CMP, CA27.29. 2.  Continue Femara. 3.  Anticipate mammogram on 09/15/2017 (ordered by Dr. Bary Castilla). 4.  Discuss BCI testing (benefit of 5 versus 10 years of adjuvant hormonal therapy).  Information provided. 5.  Rx:  mastectomy bra x 6. 6.  RTC in 6 months for MD assessment, labs (CBC with diff, CMP, CA27.29), and review of interval mammogram.   Lequita Asal, MD  08/03/2017 , 11:37 AM

## 2017-08-03 NOTE — Progress Notes (Signed)
  Oncology Nurse Navigator Documentation  Navigator Location: [facility] (08/03/17 1300)   )Navigator Encounter Type: Clinic/MDC (08/03/17 1300)                         Barriers/Navigation Needs:  (mastectomy supplies) (08/03/17 1300)   Interventions: Other (08/03/17 1300)                      Time Spent with Patient: 30 (08/03/17 1300)   Met patient today during a follow-up medical oncology visit with Dr. Mike Gip.  Patient states she does not have any mastectomy bras or a prosthesis.  States she cannot afford one if her insurance does cover it all.  Faxed [employer] Approval to [employer] for 6 bras and a prosthesis.

## 2017-08-03 NOTE — Progress Notes (Signed)
Patient here today for follow up regarding breast cancer.  Patient lost her husband 2 years ago and is now taking care of her mother-on-law.  She states she is stressed because her mother-in-law cannot see and had to have everything done for her.  She has several new blood pressure medications since last visit.

## 2017-08-03 NOTE — Patient Instructions (Signed)
Pt given a BCI paper that discusses what BCI is used for . She can call her insurance and the number is on the paper given and explained to pt

## 2017-08-04 ENCOUNTER — Telehealth: Payer: Self-pay | Admitting: *Deleted

## 2017-08-04 LAB — CANCER ANTIGEN 27.29: CA 27.29: 23.5 U/mL (ref 0.0–38.6)

## 2017-08-04 NOTE — Telephone Encounter (Signed)
Called patient to inform her that her tumor marker is normal.

## 2017-08-04 NOTE — Telephone Encounter (Signed)
-----   Message from Lequita Asal, MD sent at 08/04/2017  7:22 AM EDT ----- Regarding: Please call patient with tumor marker  Normal.   ----- Message ----- From: Interface, Lab In [place] Sent: 08/03/2017  11:17 AM To: Lequita Asal, MD

## 2017-08-07 DIAGNOSIS — C50111 Malignant neoplasm of central portion of right female breast: Secondary | ICD-10-CM | POA: Diagnosis not present

## 2017-08-07 DIAGNOSIS — Z4431 Encounter for fitting and adjustment of external right breast prosthesis: Secondary | ICD-10-CM | POA: Diagnosis not present

## 2017-08-20 ENCOUNTER — Other Ambulatory Visit: Payer: Self-pay

## 2017-08-20 DIAGNOSIS — Z1231 Encounter for screening mammogram for malignant neoplasm of breast: Secondary | ICD-10-CM

## 2017-08-26 DIAGNOSIS — C50111 Malignant neoplasm of central portion of right female breast: Secondary | ICD-10-CM | POA: Diagnosis not present

## 2017-08-26 DIAGNOSIS — I4891 Unspecified atrial fibrillation: Secondary | ICD-10-CM | POA: Diagnosis not present

## 2017-08-26 DIAGNOSIS — Z4431 Encounter for fitting and adjustment of external right breast prosthesis: Secondary | ICD-10-CM | POA: Diagnosis not present

## 2017-09-01 ENCOUNTER — Other Ambulatory Visit: Payer: Self-pay | Admitting: Physician Assistant

## 2017-09-01 DIAGNOSIS — I10 Essential (primary) hypertension: Secondary | ICD-10-CM

## 2017-09-21 ENCOUNTER — Other Ambulatory Visit: Payer: Self-pay | Admitting: Family Medicine

## 2017-09-23 DIAGNOSIS — I4891 Unspecified atrial fibrillation: Secondary | ICD-10-CM | POA: Diagnosis not present

## 2017-09-28 ENCOUNTER — Other Ambulatory Visit: Payer: Self-pay | Admitting: Physician Assistant

## 2017-10-05 ENCOUNTER — Ambulatory Visit
Admission: RE | Admit: 2017-10-05 | Discharge: 2017-10-05 | Disposition: A | Discharge status: Home / Self Care | Payer: Medicare HMO | Source: Ambulatory Visit | Attending: General Surgery | Admitting: General Surgery

## 2017-10-05 DIAGNOSIS — Z9011 Acquired absence of right breast and nipple: Secondary | ICD-10-CM | POA: Insufficient documentation

## 2017-10-05 DIAGNOSIS — Z1231 Encounter for screening mammogram for malignant neoplasm of breast: Secondary | ICD-10-CM | POA: Diagnosis not present

## 2017-10-12 ENCOUNTER — Other Ambulatory Visit: Payer: Self-pay | Admitting: Family Medicine

## 2017-10-12 DIAGNOSIS — F419 Anxiety disorder, unspecified: Secondary | ICD-10-CM

## 2017-10-13 ENCOUNTER — Ambulatory Visit (INDEPENDENT_AMBULATORY_CARE_PROVIDER_SITE_OTHER): Payer: Medicare HMO | Admitting: General Surgery

## 2017-10-13 VITALS — BP 142/70 | HR 70 | Resp 14 | Ht 66.0 in | Wt 231.0 lb

## 2017-10-13 DIAGNOSIS — C50111 Malignant neoplasm of central portion of right female breast: Secondary | ICD-10-CM

## 2017-10-13 DIAGNOSIS — Z170 Estrogen receptor positive status [ER+]: Secondary | ICD-10-CM | POA: Diagnosis not present

## 2017-10-13 NOTE — Progress Notes (Signed)
Patient ID: Erin Romero, female   DOB: Apr 18, 1951, 66 y.o.   MRN: [id number]  Chief Complaint  Patient presents with  . Follow-up    HPI Erin Romero is a 66 y.o. female who presents for a breast evaluation. The most recent left mammogram was done on 10/05/2017.  Patient does perform regular self breast checks and gets regular mammograms done.   The patient has been under additional stress with her mother-in-law becoming very difficult to manage at home and presently in short-term rehabilitation at [facility].  She is the responsible person for this 21+ year old with multiple medical issues.   HPI  Past Medical History:  Diagnosis Date  . Anxiety   . Atrial fibrillation ([place]) 2014  . Breast cancer ([place]) 2014   right breast  . Fibrocystic breast disease 2005  . GERD (gastroesophageal reflux disease)   . Hypertension 2005  . Lump or mass in breast 2014   right mastectomy  . Malignant neoplasm of central portion of female breast ([place]) January 24, 2013   stage II,T2, N1a. Mastectomy with sentinel node biopsy. 2 sentinel nodes negative, non-sentinel node w/ 5 mm macrometastatic foci in non-sentinel node.  Received post mastectomy radiation.     Past Surgical History:  Procedure Laterality Date  . ABDOMINAL HYSTERECTOMY  1999  . BREAST BIOPSY Left 2015   BENIGN FIBROTIC BREAST TISSUE WITH CLUSTERED APOCRINE  . BREAST SURGERY Right 2005,2014   biopsy  . BREAST SURGERY Right 2014   mastectomy  . COLONOSCOPY  2002  . COLONOSCOPY WITH PROPOFOL N/A 10/16/2015   Procedure: COLONOSCOPY WITH PROPOFOL;  Surgeon: Robert Bellow, MD;  Location: [facility] ENDOSCOPY;  Service: Endoscopy;  Laterality: N/A;  . KNEE SURGERY Left 2005  . MASTECTOMY Right 2014   radiation chemo  . thumb surg Right 2004  . TONSILLECTOMY     age of 33    Family History  Problem Relation Age of Onset  . Heart disease Father   . Hypertension Sister   . Cancer Mother        cervical  . Cancer  Other        ovarian,liver,colon cancer-no family member listed  . Osteoporosis Paternal Aunt   . Osteoporosis Paternal Grandmother     Social History Social History  Substance Use Topics  . Smoking status: Never Smoker  . Smokeless tobacco: Never Used  . Alcohol use No    Allergies  Allergen Reactions  . Taxotere [Docetaxel] Itching and Rash  . Sulfa Antibiotics Hives    Current Outpatient Prescriptions  Medication Sig Dispense Refill  . acetaminophen (TYLENOL) 500 MG tablet Take 500-1,000 mg by mouth every 4 (four) hours as needed for pain (q 4-6 prn).    Marland Kitchen amLODipine (NORVASC) 2.5 MG tablet Take 1 tablet (2.5 mg total) by mouth daily. 90 tablet 3  . Ascorbic Acid (VITAMIN C) 1000 MG tablet Take 500 mg by mouth daily.     Marland Kitchen aspirin 81 MG chewable tablet Chew 81 mg by mouth daily.    . bisoprolol-hydrochlorothiazide (ZIAC) 10-6.25 MG tablet Take 1 tablet by mouth daily. 90 tablet 1  . Calcium Carb-Cholecalciferol (OYSTER SHELL CALCIUM + D PO) Take 1 tablet by mouth daily.     . Cholecalciferol (VITAMIN D-3 PO) Take 1,000 Units by mouth daily.     Marland Kitchen docusate sodium (COLACE) 100 MG capsule Take 100 mg by mouth daily.     . hydrochlorothiazide (HYDRODIURIL) 12.5 MG tablet TAKE 1 TABLET  EVERY DAY 90 tablet 4  . KLOR-CON M20 20 MEQ tablet TAKE 1 TABLET EVERY DAY 90 tablet 4  . letrozole (FEMARA) 2.5 MG tablet TAKE 1 TABLET EVERY DAY 90 tablet 0  . lisinopril (PRINIVIL,ZESTRIL) 20 MG tablet TAKE 1 TABLET EVERY DAY 90 tablet 0  . omeprazole (PRILOSEC OTC) 20 MG tablet Take 1 tablet (20 mg total) by mouth daily. 90 tablet 0  . omeprazole (PRILOSEC) 20 MG capsule TAKE 1 CAPSULE EVERY DAY 90 capsule 0  . PNEUMOVAX 23 25 MCG/0.5ML injection inject 0.5 milliliter intramuscularly  0  . Potassium Chloride ER 20 MEQ TBCR TAKE 1 TABLET (20 MEQ TOTAL) BY MOUTH DAILY. 90 tablet 1  . traZODone (DESYREL) 50 MG tablet Take 1 tablet (50 mg total) by mouth daily. 90 tablet 1  . escitalopram  (LEXAPRO) 10 MG tablet TAKE 1 TABLET EVERY DAY 90 tablet 4  . warfarin (COUMADIN) 2 MG tablet Take 2 mg by mouth daily.     No current facility-administered medications for this visit.     Review of Systems Review of Systems  Constitutional: Negative.   Respiratory: Negative.   Cardiovascular: Negative.     Blood pressure (!) 142/70, pulse 70, resp. rate 14, height 5\' 6"  (1.676 m), weight 231 lb (104.8 kg).  Physical Exam Physical Exam  Constitutional: She is oriented to person, place, and time. She appears well-developed and well-nourished.  Eyes: Conjunctivae are normal. No scleral icterus.  Neck: Neck supple.  Cardiovascular: Normal rate, regular rhythm and normal heart sounds.   Pulmonary/Chest: Effort normal and breath sounds normal. Left breast exhibits no inverted nipple, no mass, no nipple discharge, no skin change and no tenderness.    Right mastectomy site is clean and healing well.   Lymphadenopathy:    She has no cervical adenopathy.    She has no axillary adenopathy.       Right: No supraclavicular adenopathy present.       Left: No supraclavicular adenopathy present.  Neurological: She is alert and oriented to person, place, and time.  Skin: Skin is warm and dry.   Upper extremity measurements were obtained at location 15 cm above as well as 10 and 20 cm below the olecranon process.  Right: 39.5, 31, 23 cm. Left: 40, 29, 23 cm.  Data Reviewed Left screening mammogram dated 10/05/2017 was reviewed. BIRAD-1.  Assessment    No evidence of recurrent cancer.    Plan        The patient has been asked to return to the office in one year with a left breast screening mammogram. The patient is aware to call back for any questions or concerns.  HPI, Physical Exam, Assessment and Plan have been scribed under the direction and in the presence of Hervey Ard, MD.  Gaspar Cola, CMA   Bary Castilla [place] [facility] 10/14/2017, 12:01 PM

## 2017-10-13 NOTE — Telephone Encounter (Signed)
Pharmacy requesting refills. Thanks!  

## 2017-10-13 NOTE — Patient Instructions (Addendum)
The patient has been asked to return to the office in one year with a left breast screening mammogram. The patient is aware to call back for any questions or concerns.

## 2017-10-14 ENCOUNTER — Encounter: Payer: Self-pay | Admitting: General Surgery

## 2017-10-14 DIAGNOSIS — E7800 Pure hypercholesterolemia, unspecified: Secondary | ICD-10-CM | POA: Diagnosis not present

## 2017-10-14 DIAGNOSIS — I4891 Unspecified atrial fibrillation: Secondary | ICD-10-CM | POA: Diagnosis not present

## 2017-10-14 DIAGNOSIS — I10 Essential (primary) hypertension: Secondary | ICD-10-CM | POA: Diagnosis not present

## 2017-10-21 DIAGNOSIS — I4891 Unspecified atrial fibrillation: Secondary | ICD-10-CM | POA: Diagnosis not present

## 2017-10-22 ENCOUNTER — Other Ambulatory Visit: Payer: Self-pay | Admitting: Physician Assistant

## 2017-10-22 DIAGNOSIS — I10 Essential (primary) hypertension: Secondary | ICD-10-CM

## 2017-10-27 DIAGNOSIS — H25813 Combined forms of age-related cataract, bilateral: Secondary | ICD-10-CM | POA: Diagnosis not present

## 2017-10-27 DIAGNOSIS — H40013 Open angle with borderline findings, low risk, bilateral: Secondary | ICD-10-CM | POA: Diagnosis not present

## 2017-11-18 DIAGNOSIS — I4891 Unspecified atrial fibrillation: Secondary | ICD-10-CM | POA: Diagnosis not present

## 2017-11-27 ENCOUNTER — Encounter: Payer: Self-pay | Admitting: Physician Assistant

## 2017-11-27 ENCOUNTER — Ambulatory Visit: Payer: Medicare HMO | Admitting: Physician Assistant

## 2017-11-27 VITALS — BP 138/86 | HR 80 | Temp 98.1°F | Resp 16 | Wt 231.0 lb

## 2017-11-27 DIAGNOSIS — G4700 Insomnia, unspecified: Secondary | ICD-10-CM | POA: Diagnosis not present

## 2017-11-27 DIAGNOSIS — I10 Essential (primary) hypertension: Secondary | ICD-10-CM

## 2017-11-27 DIAGNOSIS — I4891 Unspecified atrial fibrillation: Secondary | ICD-10-CM

## 2017-11-27 DIAGNOSIS — F419 Anxiety disorder, unspecified: Secondary | ICD-10-CM | POA: Diagnosis not present

## 2017-11-27 DIAGNOSIS — E7800 Pure hypercholesterolemia, unspecified: Secondary | ICD-10-CM | POA: Diagnosis not present

## 2017-11-27 LAB — LIPID PANEL
Cholesterol: 192 mg/dL (ref <200)
HDL: 55 mg/dL (ref >50)
LDL Cholesterol (Calc): 102 mg/dL (calc) — ABNORMAL HIGH
Non-HDL Cholesterol (Calc): 137 mg/dL (calc) — ABNORMAL HIGH (ref <130)
Total CHOL/HDL Ratio: 3.5 (calc) (ref <5.0)
Triglycerides: 233 mg/dL — ABNORMAL HIGH (ref <150)

## 2017-11-27 NOTE — Patient Instructions (Signed)

## 2017-11-27 NOTE — Progress Notes (Signed)
Patient: Erin Romero Female    DOB: 1951/10/22   66 y.o.   MRN: [id number] Visit Date: 11/27/2017  Today's Provider: Trinna Post, PA-C   Chief Complaint  Patient presents with  . Hypertension   Subjective:    Erin Romero is a 66 y/o woman presenting today to follow up for HTN, HLD, anxiety.   She is maintained on bisoprolol-HCTZ, Lisinopril, and amlodipine. She is having no side effects. No chest pain, blurred vision, difficulty urinating.  She has a history of HLD but has not had a lipid profile drawn in some time. Will get some today.  She is using Lexapro 20 mg for anxiety. She is doing well on this. Her mother-in-law just died before Thanksgiving. She was the primary caretaker for her and so this has been difficult for her. Now she lives alone with her dog.  She has gotten her flu shot and her pneumonia at the [facility] pharmacy.  Hypertension  This is a chronic problem. The problem is unchanged. Associated symptoms include palpitations. Pertinent negatives include no anxiety, blurred vision, chest pain, headaches, malaise/fatigue, neck pain, orthopnea, peripheral edema, PND, shortness of breath or sweats. There are no associated agents to hypertension. There are no compliance problems.        Allergies  Allergen Reactions  . Taxotere [Docetaxel] Itching and Rash  . Sulfa Antibiotics Hives     Current Outpatient Medications:  .  acetaminophen (TYLENOL) 500 MG tablet, Take 500-1,000 mg by mouth every 4 (four) hours as needed for pain (q 4-6 prn)., Disp: , Rfl:  .  amLODipine (NORVASC) 2.5 MG tablet, Take 1 tablet (2.5 mg total) by mouth daily., Disp: 90 tablet, Rfl: 3 .  Ascorbic Acid (VITAMIN C) 1000 MG tablet, Take 500 mg by mouth daily. , Disp: , Rfl:  .  aspirin 81 MG chewable tablet, Chew 81 mg by mouth daily., Disp: , Rfl:  .  bisoprolol-hydrochlorothiazide (ZIAC) 10-6.25 MG tablet, Take 1 tablet by mouth daily., Disp: 90 tablet, Rfl: 1 .   Calcium Carb-Cholecalciferol (OYSTER SHELL CALCIUM + D PO), Take 1 tablet by mouth daily. , Disp: , Rfl:  .  Cholecalciferol (VITAMIN D-3 PO), Take 1,000 Units by mouth daily. , Disp: , Rfl:  .  docusate sodium (COLACE) 100 MG capsule, Take 100 mg by mouth daily. , Disp: , Rfl:  .  escitalopram (LEXAPRO) 10 MG tablet, TAKE 1 TABLET EVERY DAY, Disp: 90 tablet, Rfl: 4 .  hydrochlorothiazide (HYDRODIURIL) 12.5 MG tablet, TAKE 1 TABLET EVERY DAY, Disp: 90 tablet, Rfl: 4 .  KLOR-CON M20 20 MEQ tablet, TAKE 1 TABLET EVERY DAY, Disp: 90 tablet, Rfl: 4 .  letrozole (FEMARA) 2.5 MG tablet, TAKE 1 TABLET EVERY DAY, Disp: 90 tablet, Rfl: 0 .  lisinopril (PRINIVIL,ZESTRIL) 20 MG tablet, TAKE 1 TABLET EVERY DAY, Disp: 90 tablet, Rfl: 0 .  omeprazole (PRILOSEC) 20 MG capsule, TAKE 1 CAPSULE EVERY DAY, Disp: 90 capsule, Rfl: 0 .  Potassium Chloride ER 20 MEQ TBCR, TAKE 1 TABLET (20 MEQ TOTAL) BY MOUTH DAILY., Disp: 90 tablet, Rfl: 1 .  traZODone (DESYREL) 50 MG tablet, Take 1 tablet (50 mg total) by mouth daily., Disp: 90 tablet, Rfl: 1 .  omeprazole (PRILOSEC OTC) 20 MG tablet, Take 1 tablet (20 mg total) by mouth daily., Disp: 90 tablet, Rfl: 0 .  PNEUMOVAX 23 25 MCG/0.5ML injection, inject 0.5 milliliter intramuscularly, Disp: , Rfl: 0 .  warfarin (COUMADIN) 2 MG  tablet, Take 2 mg by mouth daily., Disp: , Rfl:   Review of Systems  Constitutional: Negative.  Negative for malaise/fatigue.  Eyes: Negative for blurred vision.  Respiratory: Negative.  Negative for shortness of breath.   Cardiovascular: Positive for palpitations. Negative for chest pain, orthopnea, leg swelling and PND.       Pt has A-fib   Musculoskeletal: Negative for neck pain.  Neurological: Negative for dizziness, light-headedness and headaches.    Social History   Tobacco Use  . Smoking status: Never Smoker  . Smokeless tobacco: Never Used  Substance Use Topics  . Alcohol use: No   Objective:   BP 138/86 (BP Location: Left  Arm, Patient Position: Sitting, Cuff Size: Large)   Pulse 80   Temp 98.1 F (36.7 C) (Oral)   Resp 16   Wt 231 lb (104.8 kg)   BMI 37.28 kg/m  Vitals:   11/27/17 1119  BP: 138/86  Pulse: 80  Resp: 16  Temp: 98.1 F (36.7 C)  TempSrc: Oral  Weight: 231 lb (104.8 kg)     Physical Exam  Constitutional: She is oriented to person, place, and time. She appears well-developed and well-nourished.  Cardiovascular: Normal rate and regular rhythm.  Pulmonary/Chest: Effort normal and breath sounds normal.  Musculoskeletal: She exhibits no edema.  Neurological: She is alert and oriented to person, place, and time.  Skin: Skin is warm and dry.  Psychiatric: She has a normal mood and affect. Her behavior is normal.        Assessment & Plan:     1. Hypercholesteremia  Will check this today.   - Lipid Profile  2. Insomnia, unspecified type  Taking her trazodone, doing well.  3. Essential hypertension  HTN relatively well controlled today.  4. Anxiety  Controlled on lexapro.   5. Atrial fibrillation, unspecified type (HCC)  On Warfarin, sees Dr. Ubaldo Glassing.  Return in about 6 months (around 05/28/2018) for chronic disease: HTN, HLD, .  The entirety of the information documented in the History of Present Illness, Review of Systems and Physical Exam were personally obtained by me. Portions of this information were initially documented by Ashley Royalty, CMA and reviewed by me for thoroughness and accuracy.          Trinna Post, PA-C  [facility]

## 2017-12-01 ENCOUNTER — Other Ambulatory Visit: Payer: Self-pay | Admitting: Physician Assistant

## 2017-12-17 DIAGNOSIS — I4891 Unspecified atrial fibrillation: Secondary | ICD-10-CM | POA: Diagnosis not present

## 2018-01-04 ENCOUNTER — Other Ambulatory Visit: Payer: Self-pay | Admitting: Physician Assistant

## 2018-01-04 DIAGNOSIS — I10 Essential (primary) hypertension: Secondary | ICD-10-CM

## 2018-01-14 DIAGNOSIS — I4891 Unspecified atrial fibrillation: Secondary | ICD-10-CM | POA: Diagnosis not present

## 2018-02-01 ENCOUNTER — Other Ambulatory Visit: Payer: Medicare HMO

## 2018-02-01 ENCOUNTER — Ambulatory Visit: Payer: Medicare HMO | Admitting: Hematology and Oncology

## 2018-02-02 ENCOUNTER — Telehealth: Payer: Self-pay | Admitting: Physician Assistant

## 2018-02-02 ENCOUNTER — Inpatient Hospital Stay (HOSPITAL_BASED_OUTPATIENT_CLINIC_OR_DEPARTMENT_OTHER): Payer: Medicare HMO | Admitting: Hematology and Oncology

## 2018-02-02 ENCOUNTER — Inpatient Hospital Stay: Payer: Medicare HMO | Attending: Hematology and Oncology

## 2018-02-02 ENCOUNTER — Encounter: Payer: Self-pay | Admitting: Hematology and Oncology

## 2018-02-02 VITALS — BP 160/93 | HR 79 | Temp 99.1°F | Resp 18 | Wt 235.4 lb

## 2018-02-02 DIAGNOSIS — K219 Gastro-esophageal reflux disease without esophagitis: Secondary | ICD-10-CM | POA: Insufficient documentation

## 2018-02-02 DIAGNOSIS — Z170 Estrogen receptor positive status [ER+]: Secondary | ICD-10-CM

## 2018-02-02 DIAGNOSIS — C50111 Malignant neoplasm of central portion of right female breast: Secondary | ICD-10-CM | POA: Diagnosis not present

## 2018-02-02 DIAGNOSIS — F419 Anxiety disorder, unspecified: Secondary | ICD-10-CM | POA: Insufficient documentation

## 2018-02-02 DIAGNOSIS — Z923 Personal history of irradiation: Secondary | ICD-10-CM | POA: Diagnosis not present

## 2018-02-02 DIAGNOSIS — I10 Essential (primary) hypertension: Secondary | ICD-10-CM

## 2018-02-02 DIAGNOSIS — Z9011 Acquired absence of right breast and nipple: Secondary | ICD-10-CM | POA: Diagnosis not present

## 2018-02-02 DIAGNOSIS — Z7901 Long term (current) use of anticoagulants: Secondary | ICD-10-CM | POA: Insufficient documentation

## 2018-02-02 DIAGNOSIS — Z79811 Long term (current) use of aromatase inhibitors: Secondary | ICD-10-CM | POA: Insufficient documentation

## 2018-02-02 DIAGNOSIS — Z79899 Other long term (current) drug therapy: Secondary | ICD-10-CM | POA: Insufficient documentation

## 2018-02-02 DIAGNOSIS — M85852 Other specified disorders of bone density and structure, left thigh: Secondary | ICD-10-CM

## 2018-02-02 DIAGNOSIS — Z7982 Long term (current) use of aspirin: Secondary | ICD-10-CM | POA: Diagnosis not present

## 2018-02-02 DIAGNOSIS — I4891 Unspecified atrial fibrillation: Secondary | ICD-10-CM | POA: Diagnosis not present

## 2018-02-02 DIAGNOSIS — C50119 Malignant neoplasm of central portion of unspecified female breast: Secondary | ICD-10-CM

## 2018-02-02 LAB — CBC WITH DIFFERENTIAL/PLATELET
Basophils Absolute: 0.1 10*3/uL (ref 0–0.1)
Basophils Relative: 1 %
Eosinophils Absolute: 0.1 10*3/uL (ref 0–0.7)
Eosinophils Relative: 1 %
HCT: 39.7 % (ref 35.0–47.0)
Hemoglobin: 13.7 g/dL (ref 12.0–16.0)
Lymphocytes Relative: 19 %
Lymphs Abs: 1.8 10*3/uL (ref 1.0–3.6)
MCH: 29.6 pg (ref 26.0–34.0)
MCHC: 34.6 g/dL (ref 32.0–36.0)
MCV: 85.4 fL (ref 80.0–100.0)
Monocytes Absolute: 0.7 10*3/uL (ref 0.2–0.9)
Monocytes Relative: 8 %
Neutro Abs: 6.6 10*3/uL — ABNORMAL HIGH (ref 1.4–6.5)
Neutrophils Relative %: 71 %
Platelets: 230 10*3/uL (ref 150–440)
RBC: 4.65 MIL/uL (ref 3.80–5.20)
RDW: 15.9 % — ABNORMAL HIGH (ref 11.5–14.5)
WBC: 9.3 10*3/uL (ref 3.6–11.0)

## 2018-02-02 LAB — COMPREHENSIVE METABOLIC PANEL
ALT: 15 U/L (ref 14–54)
AST: 28 U/L (ref 15–41)
Albumin: 3.9 g/dL (ref 3.5–5.0)
Alkaline Phosphatase: 76 U/L (ref 38–126)
Anion gap: 9 (ref 5–15)
BUN: 13 mg/dL (ref 6–20)
CO2: 26 mmol/L (ref 22–32)
Calcium: 9.2 mg/dL (ref 8.9–10.3)
Chloride: 102 mmol/L (ref 101–111)
Creatinine, Ser: 0.79 mg/dL (ref 0.44–1.00)
GFR calc Af Amer: >60 mL/min (ref >60)
GFR calc non Af Amer: >60 mL/min (ref >60)
Glucose, Bld: 130 mg/dL — ABNORMAL HIGH (ref 65–99)
Potassium: 3.5 mmol/L (ref 3.5–5.1)
Sodium: 137 mmol/L (ref 135–145)
Total Bilirubin: 0.8 mg/dL (ref 0.3–1.2)
Total Protein: 7.9 g/dL (ref 6.5–8.1)

## 2018-02-02 NOTE — Progress Notes (Signed)
[facility] day:  02/02/18   Chief Complaint: Erin Romero is a 67 y.o. female with stage IIB right breast cancer who is seen for 6 month assessment.  HPI: The patient was last seen in the medical oncology clinic on 08/03/2018.  At that time, she was doing well on Femara.  Exam was stable.  Labs were normal including CA27.29 (23.5).  We discussed BCI testing.  Left mammogram on 10/05/2017 revealed no evidence of malignancy  During the interim, patient has been having increased arthritic pain. Her knees really bother her when it rains. Patient denies any breast concerns. She is not experiencing any B symptoms or interval infections. Patient is eating well. Her weight is up 4 pounds.  She complains of pain rated 7/10.    Past Medical History:  Diagnosis Date  . Anxiety   . Atrial fibrillation ([place]) 2014  . Breast cancer ([place]) 2014   right breast  . Fibrocystic breast disease 2005  . GERD (gastroesophageal reflux disease)   . Hypertension 2005  . Lump or mass in breast 2014   right mastectomy  . Malignant neoplasm of central portion of female breast ([place]) 01/24/2013   Stage II,T2, N1a. ER/PR 90%, HER-2/neu: Not overexpressed. Mastectomy with sentinel node biopsy. 2 sentinel nodes negative, non-sentinel node w/ 5 mm macrometastatic foci in non-sentinel node.  Received post mastectomy radiation.     Past Surgical History:  Procedure Laterality Date  . ABDOMINAL HYSTERECTOMY  1999  . BREAST BIOPSY Left 2015   BENIGN FIBROTIC BREAST TISSUE WITH CLUSTERED APOCRINE  . BREAST SURGERY Right 2005,2014   biopsy  . BREAST SURGERY Right 2014   mastectomy  . COLONOSCOPY  2002  . COLONOSCOPY WITH PROPOFOL N/A 10/16/2015   Procedure: COLONOSCOPY WITH PROPOFOL;  Surgeon: Robert Bellow, MD;  Location: [facility] ENDOSCOPY;  Service: Endoscopy;  Laterality: N/A;  . KNEE SURGERY Left 2005  . MASTECTOMY Right 2014   radiation chemo  . thumb surg  Right 2004  . TONSILLECTOMY     age of 7    Family History  Problem Relation Age of Onset  . Heart disease Father   . Hypertension Sister   . Cancer Mother        cervical  . Cancer Other        ovarian,liver,colon cancer-no family member listed  . Osteoporosis Paternal Aunt   . Osteoporosis Paternal Grandmother     Social History:  reports that  has never smoked. she has never used smokeless tobacco. She reports that she does not drink alcohol or use drugs.  Her husband, Ludwig Clarks, died 2015/06/14.  She takes care of her mother-in-law (she can not see and needs everything done for her).  She lives in [place].  She ha a fluffy miniature daschund.  The patient is alone today.  Allergies:  Allergies  Allergen Reactions  . Taxotere [Docetaxel] Itching and Rash  . Sulfa Antibiotics Hives    Current Medications: Current Outpatient Medications  Medication Sig Dispense Refill  . acetaminophen (TYLENOL) 500 MG tablet Take 500-1,000 mg by mouth every 4 (four) hours as needed for pain (q 4-6 prn).    Marland Kitchen amLODipine (NORVASC) 2.5 MG tablet Take 1 tablet (2.5 mg total) by mouth daily. 90 tablet 3  . Ascorbic Acid (VITAMIN C) 1000 MG tablet Take 500 mg by mouth daily.     Marland Kitchen aspirin 81 MG chewable tablet Chew 81 mg by mouth daily.    Marland Kitchen  bisoprolol-hydrochlorothiazide (ZIAC) 10-6.25 MG tablet Take 1 tablet by mouth daily. 90 tablet 1  . Calcium Carb-Cholecalciferol (OYSTER SHELL CALCIUM + D PO) Take 1 tablet by mouth daily.     . Cholecalciferol (VITAMIN D-3 PO) Take 1,000 Units by mouth daily.     Marland Kitchen docusate sodium (COLACE) 100 MG capsule Take 100 mg by mouth daily.     Marland Kitchen escitalopram (LEXAPRO) 10 MG tablet TAKE 1 TABLET EVERY DAY 90 tablet 4  . hydrochlorothiazide (HYDRODIURIL) 12.5 MG tablet TAKE 1 TABLET EVERY DAY 90 tablet 4  . KLOR-CON M20 20 MEQ tablet TAKE 1 TABLET EVERY DAY 90 tablet 4  . letrozole (FEMARA) 2.5 MG tablet TAKE 1 TABLET EVERY DAY 90 tablet 0  . lisinopril  (PRINIVIL,ZESTRIL) 20 MG tablet TAKE 1 TABLET EVERY DAY 90 tablet 0  . omeprazole (PRILOSEC) 20 MG capsule TAKE 1 CAPSULE EVERY DAY 90 capsule 0  . PNEUMOVAX 23 25 MCG/0.5ML injection inject 0.5 milliliter intramuscularly  0  . traZODone (DESYREL) 50 MG tablet Take 1 tablet (50 mg total) by mouth daily. 90 tablet 1  . warfarin (COUMADIN) 2 MG tablet Take 2 mg by mouth daily.     No current facility-administered medications for this visit.     Review of Systems:  GENERAL:  Feels "alright I reckon".  No fevers or sweats.  Weight up 4 pounds. PERFORMANCE STATUS (ECOG):  1 HEENT:  No visual changes, runny nose, sore throat, mouth sores or tenderness. Lungs: No shortness of breath or cough.  No hemoptysis. Cardiac:  No chest pain, palpitations, orthopnea, or PND. GI:  No nausea, vomiting, diarrhea, constipation, melena or hematochezia. GU:  No urgency, frequency, dysuria, or hematuria. Musculoskeletal:  No back pain.  No joint pain.  No muscle tenderness. Extremities:  No pain or swelling. Skin:  No rashes, ulcers or skin changes.  Picks at skin.  Uses Nystatin powder prn to prevent Candida infection.  Neuro:  No headache, numbness or weakness, balance or coordination issues. Endocrine:  Vasomoter symptoms.  No diabetes, thyroid issues, or night sweats.  Hot flashes. Psych:  Stress.  No anxiety or depression. Pain:  7/10 - generalized arthritic pain. Review of systems:  All other systems reviewed and found to be negative.  Physical Exam: Blood pressure (!) 160/93, pulse 79, temperature 99.1 F (37.3 C), temperature source Tympanic, resp. rate 18, weight 235 lb 7 oz (106.8 kg). GENERAL:  Well developed, well nourished, woman sitting comfortably in the exam room in no acute distress.  She is tearful at times. MENTAL STATUS:  Alert and oriented to person, place and time. HEAD:  Shoulder length brown hair with graying.  Normocephalic, atraumatic, face symmetric, no Cushingoid features. EYES:   Glasses.  Blue/brown eyes.  Pupils equal round and reactive to light and accomodation.  No conjunctivitis or scleral icterus. ENT:  Oropharynx clear without lesion.  Tongue normal. Mucous membranes moist.  RESPIRATORY:  Clear to auscultation without rales, wheezes or rhonchi. CARDIOVASCULAR:  Regular rate and rhythm without murmur, rub or gallop. BREAST:  Right mastectomy.  No erythema or nodularity.  Left breast with fibrocystic changes inferiorly.  No masses, skin changes or nipple discharge.  ABDOMEN:  Soft, non-tender, with active bowel sounds, and no appreciable  hepatosplenomegaly.  No masses. SKIN:  No rashes, ulcers or skin lesions. Scarring s/p scratching. EXTREMITIES: No edema, no skin discoloration or tenderness.  No palpable cords. LYMPH NODES: No palpable cervical, supraclavicular, axillary or inguinal adenopathy  NEUROLOGICAL: Unremarkable. PSYCH:  Appropriate.  Appointment on 02/02/2018  Component Date Value Ref Range Status  . Sodium 02/02/2018 137  135 - 145 mmol/L Final  . Potassium 02/02/2018 3.5  3.5 - 5.1 mmol/L Final  . Chloride 02/02/2018 102  101 - 111 mmol/L Final  . CO2 02/02/2018 26  22 - 32 mmol/L Final  . Glucose, Bld 02/02/2018 130* 65 - 99 mg/dL Final  . BUN 02/02/2018 13  6 - 20 mg/dL Final  . Creatinine, Ser 02/02/2018 0.79  0.44 - 1.00 mg/dL Final  . Calcium 02/02/2018 9.2  8.9 - 10.3 mg/dL Final  . Total Protein 02/02/2018 7.9  6.5 - 8.1 g/dL Final  . Albumin 02/02/2018 3.9  3.5 - 5.0 g/dL Final  . AST 02/02/2018 28  15 - 41 U/L Final  . ALT 02/02/2018 15  14 - 54 U/L Final  . Alkaline Phosphatase 02/02/2018 76  38 - 126 U/L Final  . Total Bilirubin 02/02/2018 0.8  0.3 - 1.2 mg/dL Final  . GFR calc non Af Amer 02/02/2018 >60  >60 mL/min Final  . GFR calc Af Amer 02/02/2018 >60  >60 mL/min Final   Comment: (NOTE) The eGFR has been calculated using the CKD EPI equation. This calculation has not been validated in all clinical situations. eGFR's  persistently <60 mL/min signify possible Chronic Kidney Disease.   Erin Romero gap 02/02/2018 9  5 - 15 Final   Performed at [facility], [address] [place], [place] [address]  . WBC 02/02/2018 9.3  3.6 - 11.0 K/uL Final  . RBC 02/02/2018 4.65  3.80 - 5.20 MIL/uL Final  . Hemoglobin 02/02/2018 13.7  12.0 - 16.0 g/dL Final  . HCT 02/02/2018 39.7  35.0 - 47.0 % Final  . MCV 02/02/2018 85.4  80.0 - 100.0 fL Final  . MCH 02/02/2018 29.6  26.0 - 34.0 pg Final  . MCHC 02/02/2018 34.6  32.0 - 36.0 g/dL Final  . RDW 02/02/2018 15.9* 11.5 - 14.5 % Final  . Platelets 02/02/2018 230  150 - 440 K/uL Final  . Neutrophils Relative % 02/02/2018 71  % Final  . Neutro Abs 02/02/2018 6.6* 1.4 - 6.5 K/uL Final  . Lymphocytes Relative 02/02/2018 19  % Final  . Lymphs Abs 02/02/2018 1.8  1.0 - 3.6 K/uL Final  . Monocytes Relative 02/02/2018 8  % Final  . Monocytes Absolute 02/02/2018 0.7  0.2 - 0.9 K/uL Final  . Eosinophils Relative 02/02/2018 1  % Final  . Eosinophils Absolute 02/02/2018 0.1  0 - 0.7 K/uL Final  . Basophils Relative 02/02/2018 1  % Final  . Basophils Absolute 02/02/2018 0.1  0 - 0.1 K/uL Final   Performed at [facility], [address] [place], [place] [address]    Assessment:  Kiyani Jernigan is a 67 y.o. female with a history of stage IIB (T2N1M0) right breast cancer status post radical mastectomy on 01/24/2013. Pathology revealed a 3 cm grade II invasive mammary carcinoma.  Zero of 2 sentinel lymph nodes were positive.  A non-sentinel lymph node was positive for a 0.5 cm macrometastasis with extracapsular extension.  DCIS was present. Estrogen receptor was > 90%, progesterone receptor greater than 90%, and HER-2/neu negative.  She is s/p 3 cycles of Taxotere and Cytoxan (03/08/2013 - 04/20/2013). Treatment was complicated by erythromelalgia and an urticarial reaction. She did not receive any further chemotherapy.  She received radiation from 05/30/2013 until  07/19/2013. She began Femara.  Mammogram on 02/06/2014 was suspicious for left breast  mass.  Left breast biopsy on 02/27/2014 was negative for malignancy.  Left-sided mammogram on 10/05/2017 was negative.    CA27.29 was 24.9 on 12/20/2014, 28.8 on 05/22/2015, 26.3 on 11/26/2015, 31.3 on 05/26/2016, 22.0 on 11/24/2016, 23.5 on 08/03/2017, and 22.2 on 02/02/2018.  Bone density study on 06/05/2015 revealed osteopenia with a T score of -1.9 in the left femoral neck.  Patient is on calcium and vitamin D.  Patient declined Prolia and Fosamax.  Symptomatically, she is doing well.  She is having increased arthritic pain, especially when it rains. Exam is stable.  Plan: 1.  Labs today:  CBC with diff, CMP, CA27.29. 2.  Review interval mammogram- no evidence of malignancy. 3.  Continue Femara as previously prescribed. Anticipate completion during the first part of August.  4.  Discuss BCI testing (benefit of 5 versus 10 years of adjuvant hormonal therapy). Patient declined testing.  5.  Anticipate mammogram in 09/2018.  Exam to be ordered by Dr. Bary Castilla. 6.  RTC after mammogram (end of October) for MD assessment and labs (CBC with diff, CMP, CA27.29).   Honor Loh, NP  02/02/2018 , 9:43 AM   I saw and evaluated the patient, participating in the key portions of the service and reviewing pertinent diagnostic studies and records.  I reviewed the nurse practitioner's note and agree with the findings and the plan.  The assessment and plan were discussed with the patient.  A few questions were asked by the patient and answered.   Nolon Stalls, MD 02/02/2018,9:43 AM

## 2018-02-02 NOTE — Telephone Encounter (Signed)
Patient is requesting refills on her hydrochlorothiazide (HYDRODIURIL) 12.5 MG tablet   bisoprolol-hydrochlorothiazide (ZIAC) 10-6.25 MG tablet    [facility]

## 2018-02-02 NOTE — Progress Notes (Signed)
Patient offers no complaints today, other than arthritis pain.

## 2018-02-03 ENCOUNTER — Other Ambulatory Visit: Payer: Self-pay | Admitting: Physician Assistant

## 2018-02-03 LAB — CANCER ANTIGEN 27.29: CA 27.29: 22.2 U/mL (ref 0.0–38.6)

## 2018-02-04 MED ORDER — BISOPROLOL-HYDROCHLOROTHIAZIDE 10-6.25 MG PO TABS: 1.0000 | ORAL_TABLET | Freq: Every day | ORAL | 1 refills | Status: DC

## 2018-02-04 MED ORDER — HYDROCHLOROTHIAZIDE 12.5 MG PO TABS: 12.5000 mg | ORAL_TABLET | Freq: Every day | ORAL | 1 refills | Status: DC

## 2018-02-17 DIAGNOSIS — I4891 Unspecified atrial fibrillation: Secondary | ICD-10-CM | POA: Diagnosis not present

## 2018-03-08 ENCOUNTER — Other Ambulatory Visit: Payer: Self-pay | Admitting: Physician Assistant

## 2018-03-08 DIAGNOSIS — I10 Essential (primary) hypertension: Secondary | ICD-10-CM

## 2018-03-11 ENCOUNTER — Other Ambulatory Visit: Payer: Self-pay | Admitting: Physician Assistant

## 2018-03-11 DIAGNOSIS — I10 Essential (primary) hypertension: Secondary | ICD-10-CM

## 2018-03-11 MED ORDER — LISINOPRIL 20 MG PO TABS: 20.0000 mg | ORAL_TABLET | Freq: Every day | ORAL | 0 refills | Status: DC

## 2018-03-11 NOTE — Telephone Encounter (Signed)
Patient called stating she is almost out of Lisinopril 20 mg.   She said [facility] had faxed a request to [place] but has not heard from [place]. Please send in refills to [facility] mail order.

## 2018-03-17 DIAGNOSIS — I4891 Unspecified atrial fibrillation: Secondary | ICD-10-CM | POA: Diagnosis not present

## 2018-04-07 ENCOUNTER — Other Ambulatory Visit: Payer: Self-pay | Admitting: Physician Assistant

## 2018-04-07 DIAGNOSIS — R791 Abnormal coagulation profile: Secondary | ICD-10-CM | POA: Diagnosis not present

## 2018-04-14 DIAGNOSIS — E7800 Pure hypercholesterolemia, unspecified: Secondary | ICD-10-CM | POA: Diagnosis not present

## 2018-04-14 DIAGNOSIS — I10 Essential (primary) hypertension: Secondary | ICD-10-CM | POA: Diagnosis not present

## 2018-04-14 DIAGNOSIS — R791 Abnormal coagulation profile: Secondary | ICD-10-CM | POA: Diagnosis not present

## 2018-04-14 DIAGNOSIS — I482 Chronic atrial fibrillation: Secondary | ICD-10-CM | POA: Diagnosis not present

## 2018-04-14 DIAGNOSIS — I4891 Unspecified atrial fibrillation: Secondary | ICD-10-CM | POA: Diagnosis not present

## 2018-04-16 ENCOUNTER — Other Ambulatory Visit: Payer: Self-pay | Admitting: Physician Assistant

## 2018-04-16 DIAGNOSIS — G4700 Insomnia, unspecified: Secondary | ICD-10-CM

## 2018-04-16 MED ORDER — TRAZODONE HCL 50 MG PO TABS: 50.0000 mg | ORAL_TABLET | Freq: Every day | ORAL | 1 refills | Status: DC

## 2018-04-16 NOTE — Telephone Encounter (Signed)
t needs a refill on her trazodone 50 mg  She uses [facility] back is [phone]  Thanks teri

## 2018-04-21 DIAGNOSIS — I4891 Unspecified atrial fibrillation: Secondary | ICD-10-CM | POA: Diagnosis not present

## 2018-04-21 DIAGNOSIS — R791 Abnormal coagulation profile: Secondary | ICD-10-CM | POA: Diagnosis not present

## 2018-04-27 DIAGNOSIS — H524 Presbyopia: Secondary | ICD-10-CM | POA: Diagnosis not present

## 2018-05-17 ENCOUNTER — Other Ambulatory Visit: Payer: Self-pay | Admitting: Physician Assistant

## 2018-05-17 DIAGNOSIS — I10 Essential (primary) hypertension: Secondary | ICD-10-CM

## 2018-05-19 DIAGNOSIS — I4891 Unspecified atrial fibrillation: Secondary | ICD-10-CM | POA: Diagnosis not present

## 2018-05-28 ENCOUNTER — Ambulatory Visit (INDEPENDENT_AMBULATORY_CARE_PROVIDER_SITE_OTHER): Payer: Medicare HMO | Admitting: Physician Assistant

## 2018-05-28 ENCOUNTER — Encounter: Payer: Self-pay | Admitting: Physician Assistant

## 2018-05-28 VITALS — BP 138/86 | HR 84 | Temp 99.1°F | Resp 16 | Wt 236.0 lb

## 2018-05-28 DIAGNOSIS — I10 Essential (primary) hypertension: Secondary | ICD-10-CM | POA: Diagnosis not present

## 2018-05-28 DIAGNOSIS — E876 Hypokalemia: Secondary | ICD-10-CM

## 2018-05-28 DIAGNOSIS — E7800 Pure hypercholesterolemia, unspecified: Secondary | ICD-10-CM | POA: Diagnosis not present

## 2018-05-28 DIAGNOSIS — G4700 Insomnia, unspecified: Secondary | ICD-10-CM | POA: Diagnosis not present

## 2018-05-28 DIAGNOSIS — F419 Anxiety disorder, unspecified: Secondary | ICD-10-CM | POA: Diagnosis not present

## 2018-05-28 MED ORDER — ATORVASTATIN CALCIUM 10 MG PO TABS: 10.0000 mg | ORAL_TABLET | Freq: Every day | ORAL | 0 refills | Status: DC

## 2018-05-28 NOTE — Patient Instructions (Addendum)
MAMMOGRAM: October, scheduled by surgery Please schedule appointment with oncology after your mammogram in October We will see you back at [facility] in 6 -8 weeks to check on your cholesterol Your visit with cardiology for PT is on 05/31/2018

## 2018-05-28 NOTE — Progress Notes (Signed)
Patient: Erin Romero Female    DOB: Apr 21, 1951   67 y.o.   MRN: [id number] Visit Date: 05/28/2018  Today's Provider: Trinna Post, PA-C   Chief Complaint  Patient presents with  . Hypertension   Subjective:   Depression/Insomnia: Lexapro and trazodone.   HLD: requires statin  HTN: amlodipine, Lisinopril, HCTZ  Not taking bisoprolol-HCTZ    Hypertension  This is a chronic problem. The problem is unchanged. The problem is controlled. Associated symptoms include shortness of breath. Pertinent negatives include no anxiety, blurred vision, chest pain, headaches, malaise/fatigue, neck pain, orthopnea, palpitations, peripheral edema, PND or sweats. There are no associated agents to hypertension. There are no known risk factors for coronary artery disease. There are no compliance problems.    BP Readings from Last 3 Encounters:  05/28/18 138/86  02/02/18 (!) 160/93  11/27/17 138/86        Allergies  Allergen Reactions  . Taxotere [Docetaxel] Itching and Rash  . Sulfa Antibiotics Hives     Current Outpatient Medications:  .  acetaminophen (TYLENOL) 500 MG tablet, Take 500-1,000 mg by mouth every 4 (four) hours as needed for pain (q 4-6 prn)., Disp: , Rfl:  .  amLODipine (NORVASC) 2.5 MG tablet, Take 1 tablet (2.5 mg total) by mouth daily., Disp: 90 tablet, Rfl: 3 .  aspirin 81 MG chewable tablet, Chew 81 mg by mouth daily., Disp: , Rfl:  .  Calcium Carb-Cholecalciferol (OYSTER SHELL CALCIUM + D PO), Take 1 tablet by mouth daily. , Disp: , Rfl:  .  Cholecalciferol (VITAMIN D-3 PO), Take 1,000 Units by mouth daily. , Disp: , Rfl:  .  docusate sodium (COLACE) 100 MG capsule, Take 100 mg by mouth daily. , Disp: , Rfl:  .  escitalopram (LEXAPRO) 10 MG tablet, TAKE 1 TABLET EVERY DAY, Disp: 90 tablet, Rfl: 4 .  hydrochlorothiazide (HYDRODIURIL) 12.5 MG tablet, Take 1 tablet (12.5 mg total) by mouth daily., Disp: 90 tablet, Rfl: 1 .  KLOR-CON M20 20 MEQ tablet, TAKE  1 TABLET EVERY DAY, Disp: 90 tablet, Rfl: 4 .  letrozole (FEMARA) 2.5 MG tablet, TAKE 1 TABLET EVERY DAY, Disp: 90 tablet, Rfl: 0 .  lisinopril (PRINIVIL,ZESTRIL) 20 MG tablet, TAKE 1 TABLET (20 MG TOTAL) BY MOUTH DAILY., Disp: 90 tablet, Rfl: 0 .  omeprazole (PRILOSEC) 20 MG capsule, TAKE 1 CAPSULE EVERY DAY, Disp: 90 capsule, Rfl: 0 .  traZODone (DESYREL) 50 MG tablet, Take 1 tablet (50 mg total) by mouth daily., Disp: 90 tablet, Rfl: 1 .  Ascorbic Acid (VITAMIN C) 1000 MG tablet, Take 500 mg by mouth daily. , Disp: , Rfl:  .  bisoprolol-hydrochlorothiazide (ZIAC) 10-6.25 MG tablet, Take 1 tablet by mouth daily. (Patient not taking: Reported on 05/28/2018), Disp: 90 tablet, Rfl: 1 .  PNEUMOVAX 23 25 MCG/0.5ML injection, inject 0.5 milliliter intramuscularly, Disp: , Rfl: 0 .  warfarin (COUMADIN) 2 MG tablet, Take 2 mg by mouth daily., Disp: , Rfl:   Review of Systems  Constitutional: Positive for fatigue. Negative for activity change, appetite change, chills, diaphoresis, fever, malaise/fatigue and unexpected weight change.  Eyes: Negative for blurred vision.  Respiratory: Positive for shortness of breath. Negative for apnea, cough, choking, chest tightness, wheezing and stridor.   Cardiovascular: Negative for chest pain, palpitations, orthopnea, leg swelling and PND.  Gastrointestinal: Negative.   Musculoskeletal: Positive for joint swelling and myalgias. Negative for arthralgias, back pain, gait problem, neck pain and neck stiffness.  Neurological: Negative for  dizziness, light-headedness and headaches.    Social History   Tobacco Use  . Smoking status: Never Smoker  . Smokeless tobacco: Never Used  Substance Use Topics  . Alcohol use: No   Objective:   BP 138/86 (BP Location: Left Arm, Patient Position: Sitting, Cuff Size: Large)   Pulse 84   Temp 99.1 F (37.3 C) (Oral)   Resp 16   Wt 236 lb (107 kg)   BMI 38.09 kg/m  Vitals:   05/28/18 1113  BP: 138/86  Pulse: 84  Resp:  16  Temp: 99.1 F (37.3 C)  TempSrc: Oral  Weight: 236 lb (107 kg)     Physical Exam  Constitutional: She is oriented to person, place, and time. She appears well-developed and well-nourished.  Cardiovascular: Normal rate and regular rhythm.  Pulmonary/Chest: Effort normal and breath sounds normal.  Neurological: She is alert and oriented to person, place, and time.  Psychiatric: She has a normal mood and affect. Her behavior is normal.        Assessment & Plan:     1. Essential hypertension  - Comprehensive Metabolic Panel (CMET)  2. Anxiety  Continue current medications.  3. Insomnia, unspecified type  Continue current medications.   4. Hypokalemia  - Comprehensive Metabolic Panel (CMET)  5. Hypercholesteremia  - atorvastatin (LIPITOR) 10 MG tablet; Take 1 tablet (10 mg total) by mouth daily.  Dispense: 90 tablet; Refill: 0 - Comprehensive Metabolic Panel (CMET)  Return in about 6 weeks (around 07/09/2018).  The entirety of the information documented in the History of Present Illness, Review of Systems and Physical Exam were personally obtained by me. Portions of this information were initially documented by Ashley Royalty, CMA and reviewed by me for thoroughness and accuracy.        Trinna Post, PA-C  [facility]

## 2018-05-29 LAB — COMPREHENSIVE METABOLIC PANEL
ALT: 13 IU/L (ref 0–32)
AST: 17 IU/L (ref 0–40)
Albumin/Globulin Ratio: 1.3 (ref 1.2–2.2)
Albumin: 4.1 g/dL (ref 3.6–4.8)
Alkaline Phosphatase: 82 IU/L (ref 39–117)
BUN/Creatinine Ratio: 17 (ref 12–28)
BUN: 14 mg/dL (ref 8–27)
Bilirubin Total: 0.4 mg/dL (ref 0.0–1.2)
CO2: 26 mmol/L (ref 20–29)
Calcium: 9.4 mg/dL (ref 8.7–10.3)
Chloride: 100 mmol/L (ref 96–106)
Creatinine, Ser: 0.83 mg/dL (ref 0.57–1.00)
GFR calc Af Amer: 84 mL/min/{1.73_m2} (ref >59)
GFR calc non Af Amer: 73 mL/min/{1.73_m2} (ref >59)
Globulin, Total: 3.2 g/dL (ref 1.5–4.5)
Glucose: 93 mg/dL (ref 65–99)
Potassium: 4.3 mmol/L (ref 3.5–5.2)
Sodium: 142 mmol/L (ref 134–144)
Total Protein: 7.3 g/dL (ref 6.0–8.5)

## 2018-05-31 ENCOUNTER — Other Ambulatory Visit: Payer: Self-pay | Admitting: Physician Assistant

## 2018-05-31 DIAGNOSIS — I4891 Unspecified atrial fibrillation: Secondary | ICD-10-CM | POA: Diagnosis not present

## 2018-05-31 DIAGNOSIS — I10 Essential (primary) hypertension: Secondary | ICD-10-CM

## 2018-06-03 NOTE — Telephone Encounter (Signed)
Pt called saying [employer] called her today saying they have not heard back regarding the request for a refill on her Amlodopine 2.5mg   [employer] gave her a fax number that we can fax the refill request back to [phone]   Thanks teri

## 2018-06-09 ENCOUNTER — Other Ambulatory Visit: Payer: Self-pay | Admitting: Physician Assistant

## 2018-06-14 DIAGNOSIS — R791 Abnormal coagulation profile: Secondary | ICD-10-CM | POA: Diagnosis not present

## 2018-06-15 ENCOUNTER — Ambulatory Visit (INDEPENDENT_AMBULATORY_CARE_PROVIDER_SITE_OTHER): Payer: 59

## 2018-06-15 VITALS — BP 162/76 | HR 92 | Temp 99.6°F | Ht 64.0 in | Wt 240.0 lb

## 2018-06-15 DIAGNOSIS — Z0000 Encounter for general adult medical examination without abnormal findings: Secondary | ICD-10-CM | POA: Diagnosis not present

## 2018-06-15 NOTE — Progress Notes (Signed)
Subjective:   Erin Romero is a 67 y.o. female who presents for Medicare Annual (Subsequent) preventive examination.  Review of Systems:  N/A  Cardiac Risk Factors include: advanced age (>67mn, >>78women);obesity (BMI >30kg/m2);hypertension;dyslipidemia     Objective:     Vitals: BP (!) 162/76 (BP Location: Left Arm)   Pulse 92   Temp 99.6 F (37.6 C) (Oral)   Ht 5' 4"  (1.626 m)   Wt 240 lb (108.9 kg)   BMI 41.20 kg/m   Body mass index is 41.2 kg/m.  Advanced Directives 06/15/2018 08/03/2017 06/25/2017 11/24/2016 11/05/2016 05/26/2016 01/09/2016  Does Patient Have a Medical Advance Directive? Yes Yes Yes No No No No  Type of AParamedicof ACampusLiving will - Living will - - - -  Copy of HAlvaradoin Chart? Yes - - - - - -  Would patient like information on creating a medical advance directive? - - - No - Patient declined Yes - EScientist, clinical (histocompatibility and immunogenetics)given Yes - EScientist, clinical (histocompatibility and immunogenetics)given -    Tobacco Social History   Tobacco Use  Smoking Status Never Smoker  Smokeless Tobacco Never Used     Counseling given: Not Answered   Clinical Intake:  Pre-visit preparation completed: Yes  Pain : 0-10 Pain Score: 6  Pain Type: Chronic pain Pain Location: Knee Pain Orientation: Right Pain Descriptors / Indicators: Throbbing Pain Frequency: Intermittent     Nutritional Status: BMI > 30  Obese Diabetes: No  How often do you need to have someone help you when you read instructions, pamphlets, or other written materials from your doctor or pharmacy?: 1 - Never  Interpreter Needed?: No  Information [facility] :: [facility]  Past Medical History:  Diagnosis Date  . Anxiety   . Atrial fibrillation [place]  . Breast cancer ([place]   right breast  . Fibrocystic breast disease 2005  . GERD (gastroesophageal reflux disease)   . Hypertension 2005  . Lump or mass in breast 2014   right mastectomy  . Malignant  neoplasm of central portion of female breast ([place] 01/24/2013   Stage II,T2, N1a. ER/PR 90%, HER-2/neu: Not overexpressed. Mastectomy with sentinel node biopsy. 2 sentinel nodes negative, non-sentinel node w/ 5 mm macrometastatic foci in non-sentinel node.  Received post mastectomy radiation.    Past Surgical History:  Procedure Laterality Date  . ABDOMINAL HYSTERECTOMY  1999  . BREAST BIOPSY Left 2015   BENIGN FIBROTIC BREAST TISSUE WITH CLUSTERED APOCRINE  . BREAST SURGERY Right 2005,2014   biopsy  . BREAST SURGERY Right 2014   mastectomy  . COLONOSCOPY  2002  . COLONOSCOPY WITH PROPOFOL N/A 10/16/2015   Procedure: COLONOSCOPY WITH PROPOFOL;  Surgeon: Erin Bellow MD;  [facility]  Service: Endoscopy;  Laterality: N/A;  . KNEE SURGERY Left 2005  . MASTECTOMY Right 2014   radiation chemo  . thumb surg Right 2004  . TONSILLECTOMY     age of 89  Family History  Problem Relation Age of Onset  . Heart disease Father   . Hypertension Sister   . Cancer Mother        cervical  . Cancer Other        ovarian,liver,colon cancer-no family member listed  . Osteoporosis Paternal Aunt   . Osteoporosis Paternal Grandmother    Social History   Socioeconomic History  . Marital status: Widowed    Spouse name: EShama Romero . Number of children: 0  .  Years of education: 69  . Highest education level: 12th grade  Occupational History    Employer: UNEMPLOYED    Comment: disabilty from breast cancer  Social Needs  . Financial resource strain: Not hard at all  . Food insecurity:    Worry: Never true    Inability: Never true  . Transportation needs:    Medical: No    Non-medical: No  Tobacco Use  . Smoking status: Never Smoker  . Smokeless tobacco: Never Used  Substance and Sexual Activity  . Alcohol use: No  . Drug use: No  . Sexual activity: Not on file  Lifestyle  . Physical activity:    Days per week: Not on file    Minutes per session: Not on file  .  Stress: Not at all  Relationships  . Social connections:    Talks on phone: Not on file    Gets together: Not on file    Attends religious service: Not on file    Active member of club or organization: Not on file    Attends meetings of clubs or organizations: Not on file    Relationship status: Not on file  Other Topics Concern  . Not on file  Social History Narrative  . Not on file    Outpatient Encounter Medications as of 06/15/2018  Medication Sig  . acetaminophen (TYLENOL) 500 MG tablet Take 500-1,000 mg by mouth every 4 (four) hours as needed for pain (q 4-6 prn).  Marland Kitchen amLODipine (NORVASC) 2.5 MG tablet TAKE 1 TABLET EVERY DAY  . Ascorbic Acid (VITAMIN C) 1000 MG tablet Take 500 mg by mouth daily.   Marland Kitchen aspirin 81 MG chewable tablet Chew 81 mg by mouth daily.  Marland Kitchen atorvastatin (LIPITOR) 10 MG tablet Take 1 tablet (10 mg total) by mouth daily.  . Calcium Carb-Cholecalciferol (OYSTER SHELL CALCIUM + D PO) Take 1 tablet by mouth daily.   . Cholecalciferol (VITAMIN D-3 PO) Take 1,000 Units by mouth daily.   Marland Kitchen docusate sodium (COLACE) 100 MG capsule Take 100 mg by mouth daily.   Marland Kitchen escitalopram (LEXAPRO) 10 MG tablet TAKE 1 TABLET EVERY DAY  . hydrochlorothiazide (HYDRODIURIL) 12.5 MG tablet Take 1 tablet (12.5 mg total) by mouth daily.  Marland Kitchen KLOR-CON M20 20 MEQ tablet TAKE 1 TABLET EVERY DAY  . letrozole (FEMARA) 2.5 MG tablet TAKE 1 TABLET EVERY DAY  . lisinopril (PRINIVIL,ZESTRIL) 20 MG tablet TAKE 1 TABLET (20 MG TOTAL) BY MOUTH DAILY.  Marland Kitchen omeprazole (PRILOSEC) 20 MG capsule TAKE 1 CAPSULE EVERY DAY  . PNEUMOVAX 23 25 MCG/0.5ML injection inject 0.5 milliliter intramuscularly  . traZODone (DESYREL) 50 MG tablet Take 1 tablet (50 mg total) by mouth daily.  Marland Kitchen warfarin (COUMADIN) 2 MG tablet Take 2 mg by mouth daily.  . bisoprolol-hydrochlorothiazide (ZIAC) 10-6.25 MG tablet Take 1 tablet by mouth daily. (Patient not taking: Reported on 05/28/2018)   No facility-administered encounter  medications on file as of 06/15/2018.     Activities of Daily Living In your present state of health, do you have any difficulty performing the following activities: 06/15/2018 06/25/2017  Hearing? N N  Vision? Y Y  Comment Due to cataracts on both eyes. -  Difficulty concentrating or making decisions? N N  Walking or climbing stairs? N Y  Dressing or bathing? N N  Doing errands, shopping? N N  Preparing Food and eating ? N N  Using the Toilet? N N  In the past six months, have you accidently leaked  urine? N N  Do you have problems with loss of bowel control? N N  Managing your Medications? N N  Managing your Finances? N N  Housekeeping or managing your Housekeeping? N N  Some recent data might be hidden    Patient Care Team: Paulene Floor as PCP - General (Physician Assistant) Bary Castilla Forest Gleason, MD as Consulting Physician (General Surgery) Teodoro Spray, MD as Consulting Physician (Cardiology) Lequita Asal, MD as Referring Physician (Hematology and Oncology)    Assessment:   This is a routine wellness examination for [place].  Exercise Activities and Dietary recommendations Current Exercise Habits: The patient does not participate in regular exercise at present, Exercise limited by: None identified  Goals    . DIET - REDUCE SUGAR INTAKE     Recommend cutting out sugar and ice cream from daily diet and substituting for sugar free snacks.     . Exercise 150 minutes per week (moderate activity)       Fall Risk Fall Risk  06/15/2018 06/25/2017 11/05/2016 08/22/2015 07/26/2015  Falls in the past year? No No No No No   Is the patient's home free of loose throw rugs in walkways, pet beds, electrical cords, etc?   yes      Grab bars in the bathroom? no      Handrails on the stairs?   no      Adequate lighting?   yes  Timed Get Up and Go performed: N/A  Depression Screen PHQ 2/9 Scores 06/15/2018 06/25/2017 05/21/2017 11/05/2016  PHQ - 2 Score 1 0 1 0  PHQ- 9  Score - 1 5 -     Cognitive Function     6CIT Screen 06/15/2018 06/25/2017  What Year? 0 points 0 points  What month? 0 points 0 points  What time? 0 points 0 points  Count back from 20 0 points 0 points  Months in reverse 0 points 0 points  Repeat phrase 0 points 4 points  Total Score 0 4    Immunization History  Administered Date(s) Administered  . Influenza,inj,Quad PF,6+ Mos 09/06/2015, 09/21/2017  . Pneumococcal Conjugate-13 08/22/2015  . Pneumococcal Polysaccharide-23 09/03/2016, 09/21/2017    Qualifies for Shingles Vaccine? Due for Shingles vaccine. Declined my offer to administer today. Education has been provided regarding the importance of this vaccine. Pt has been advised to call her insurance company to determine her out of pocket expense. Advised she may also receive this vaccine at her local pharmacy or Health Dept. Verbalized acceptance and understanding.  Screening Tests Health Maintenance  Topic Date Due  . TETANUS/TDAP  12/22/2026 (Originally 03/23/1970)  . INFLUENZA VACCINE  07/22/2018  . MAMMOGRAM  10/06/2019  . COLONOSCOPY  10/15/2025  . DEXA SCAN  Completed  . Hepatitis C Screening  Completed  . PNA vac Low Risk Adult  Completed    Cancer Screenings: Lung: Low Dose CT Chest recommended if Age 33-80 years, 30 pack-year currently smoking OR have quit w/in 15years. Patient does not qualify. Breast:  Up to date on Mammogram? Yes   Up to date of Bone Density/Dexa? Yes Colorectal: Up to date  Additional Screenings:  Hepatitis C Screening: Up to date     Plan:  I have personally reviewed and addressed the Medicare Annual Wellness questionnaire and have noted the following in the patient's chart:  A. Medical and social history B. Use of alcohol, tobacco or illicit drugs  C. Current medications and supplements D. Functional ability and status E.  Nutritional status F.  Physical activity G. Advance directives H. List of other physicians I.    Hospitalizations, surgeries, and ER visits in previous 12 months J.  [place] such as hearing and vision if needed, cognitive and depression L. Referrals and appointments - none  In addition, I have reviewed and discussed with patient certain preventive protocols, quality metrics, and best practice recommendations. A written personalized care plan for preventive services as well as general preventive health recommendations were provided to patient.  See attached scanned questionnaire for additional information.   Signed,  Fabio Neighbors, LPN Nurse Health Advisor   Nurse Recommendations: Pt declined the tetanus vaccine today. Pt's BP was high on both readings today. Pt declined any abnormal s/s. Advised pt to check BP once daily for a couple weeks to see if she is getting high BP readings at home. Pt to CB if so or f/u with cardiology.

## 2018-06-15 NOTE — Patient Instructions (Addendum)
Erin Romero , Thank you for taking time to come for your Medicare Wellness Visit. I appreciate your ongoing commitment to your health goals. Please review the following plan we discussed and let me know if I can assist you in the future.   Screening recommendations/referrals: Colonoscopy: Up to date Mammogram: Up to date Bone Density: Up to date Recommended yearly ophthalmology/optometry visit for glaucoma screening and checkup Recommended yearly dental visit for hygiene and checkup  Vaccinations: Influenza vaccine: Up to date Pneumococcal vaccine: Up to date Tdap vaccine: Pt declines today.  Shingles vaccine: Pt declines today.     Advanced directives: Already on file.  Conditions/risks identified: Obesity- recommend cutting out sugar and ice cream from daily diet and substituting for sugar free snacks.   Next appointment: 08/04/18 @ 11 AM with Carles Collet.   Preventive Care 68 Years and Older, Female Preventive care refers to lifestyle choices and visits with your health care provider that can promote health and wellness. What does preventive care include?  A yearly physical exam. This is also called an annual well check.  Dental exams once or twice a year.  Routine eye exams. Ask your health care provider how often you should have your eyes checked.  Personal lifestyle choices, including:  Daily care of your teeth and gums.  Regular physical activity.  Eating a healthy diet.  Avoiding tobacco and drug use.  Limiting alcohol use.  Practicing safe sex.  Taking low-dose aspirin every day.  Taking vitamin and mineral supplements as recommended by your health care provider. What happens during an annual well check? The services and screenings done by your health care provider during your annual well check will depend on your age, overall health, lifestyle risk factors, and family history of disease. Counseling  Your health care provider may ask you questions about  your:  Alcohol use.  Tobacco use.  Drug use.  Emotional well-being.  Home and relationship well-being.  Sexual activity.  Eating habits.  History of falls.  Memory and ability to understand (cognition).  Work and work Statistician.  Reproductive health. Screening  You may have the following tests or measurements:  Height, weight, and BMI.  Blood pressure.  Lipid and cholesterol levels. These may be checked every 5 years, or more frequently if you are over 71 years old.  Skin check.  Lung cancer screening. You may have this screening every year starting at age 28 if you have a 30-pack-year history of smoking and currently smoke or have quit within the past 15 years.  Fecal occult blood test (FOBT) of the stool. You may have this test every year starting at age 7.  Flexible sigmoidoscopy or colonoscopy. You may have a sigmoidoscopy every 5 years or a colonoscopy every 10 years starting at age 76.  Hepatitis C blood test.  Hepatitis B blood test.  Sexually transmitted disease (STD) testing.  Diabetes screening. This is done by checking your blood sugar (glucose) after you have not eaten for a while (fasting). You may have this done every 1-3 years.  Bone density scan. This is done to screen for osteoporosis. You may have this done starting at age 59.  Mammogram. This may be done every 1-2 years. Talk to your health care provider about how often you should have regular mammograms. Talk with your health care provider about your test results, treatment options, and if necessary, the need for more tests. Vaccines  Your health care provider may recommend certain vaccines, such as:  Influenza  vaccine. This is recommended every year.  Tetanus, diphtheria, and acellular pertussis (Tdap, Td) vaccine. You may need a Td booster every 10 years.  Zoster vaccine. You may need this after age 32.  Pneumococcal 13-valent conjugate (PCV13) vaccine. One dose is recommended  after age 67.  Pneumococcal polysaccharide (PPSV23) vaccine. One dose is recommended after age 31. Talk to your health care provider about which screenings and vaccines you need and how often you need them. This information is not intended to replace advice given to you by your health care provider. Make sure you discuss any questions you have with your health care provider. Document Released: 01/04/2016 Document Revised: 08/27/2016 Document Reviewed: 10/09/2015 Elsevier Interactive Patient Education  2017 [place] Prevention in the Home Falls can cause injuries. They can happen to people of all ages. There are many things you can do to make your home safe and to help prevent falls. What can I do on the outside of my home?  Regularly fix the edges of walkways and driveways and fix any cracks.  Remove anything that might make you trip as you walk through a door, such as a raised step or threshold.  Trim any bushes or trees on the path to your home.  Use bright outdoor lighting.  Clear any walking paths of anything that might make someone trip, such as rocks or tools.  Regularly check to see if handrails are loose or broken. Make sure that both sides of any steps have handrails.  Any raised decks and porches should have guardrails on the edges.  Have any leaves, snow, or ice cleared regularly.  Use sand or salt on walking paths during winter.  Clean up any spills in your garage right away. This includes oil or grease spills. What can I do in the bathroom?  Use night lights.  Install grab bars by the toilet and in the tub and shower. Do not use towel bars as grab bars.  Use non-skid mats or decals in the tub or shower.  If you need to sit down in the shower, use a plastic, non-slip stool.  Keep the floor dry. Clean up any water that spills on the floor as soon as it happens.  Remove soap buildup in the tub or shower regularly.  Attach bath mats securely with  double-sided non-slip rug tape.  Do not have throw rugs and other things on the floor that can make you trip. What can I do in the bedroom?  Use night lights.  Make sure that you have a light by your bed that is easy to reach.  Do not use any sheets or blankets that are too big for your bed. They should not hang down onto the floor.  Have a firm chair that has side arms. You can use this for support while you get dressed.  Do not have throw rugs and other things on the floor that can make you trip. What can I do in the kitchen?  Clean up any spills right away.  Avoid walking on wet floors.  Keep items that you use a lot in easy-to-reach places.  If you need to reach something above you, use a strong step stool that has a grab bar.  Keep electrical cords out of the way.  Do not use floor polish or wax that makes floors slippery. If you must use wax, use non-skid floor wax.  Do not have throw rugs and other things on the floor that can  make you trip. What can I do with my stairs?  Do not leave any items on the stairs.  Make sure that there are handrails on both sides of the stairs and use them. Fix handrails that are broken or loose. Make sure that handrails are as long as the stairways.  Check any carpeting to make sure that it is firmly attached to the stairs. Fix any carpet that is loose or worn.  Avoid having throw rugs at the top or bottom of the stairs. If you do have throw rugs, attach them to the floor with carpet tape.  Make sure that you have a light switch at the top of the stairs and the bottom of the stairs. If you do not have them, ask someone to add them for you. What else can I do to help prevent falls?  Wear shoes that:  Do not have high heels.  Have rubber bottoms.  Are comfortable and fit you well.  Are closed at the toe. Do not wear sandals.  If you use a stepladder:  Make sure that it is fully opened. Do not climb a closed stepladder.  Make  sure that both sides of the stepladder are locked into place.  Ask someone to hold it for you, if possible.  Clearly mark and make sure that you can see:  Any grab bars or handrails.  First and last steps.  Where the edge of each step is.  Use tools that help you move around (mobility aids) if they are needed. These include:  Canes.  Walkers.  Scooters.  Crutches.  Turn on the lights when you go into a dark area. Replace any light bulbs as soon as they burn out.  Set up your furniture so you have a clear path. Avoid moving your furniture around.  If any of your floors are uneven, fix them.  If there are any pets around you, be aware of where they are.  Review your medicines with your doctor. Some medicines can make you feel dizzy. This can increase your chance of falling. Ask your doctor what other things that you can do to help prevent falls. This information is not intended to replace advice given to you by your health care provider. Make sure you discuss any questions you have with your health care provider. Document Released: 10/04/2009 Document Revised: 05/15/2016 Document Reviewed: 01/12/2015 Elsevier Interactive Patient Education  2017 [employer].

## 2018-06-21 ENCOUNTER — Other Ambulatory Visit: Payer: Self-pay | Admitting: Physician Assistant

## 2018-06-21 DIAGNOSIS — I10 Essential (primary) hypertension: Secondary | ICD-10-CM

## 2018-07-12 DIAGNOSIS — I481 Persistent atrial fibrillation: Secondary | ICD-10-CM | POA: Diagnosis not present

## 2018-07-12 DIAGNOSIS — I10 Essential (primary) hypertension: Secondary | ICD-10-CM | POA: Diagnosis not present

## 2018-07-12 DIAGNOSIS — I4891 Unspecified atrial fibrillation: Secondary | ICD-10-CM | POA: Diagnosis not present

## 2018-07-12 DIAGNOSIS — E7800 Pure hypercholesterolemia, unspecified: Secondary | ICD-10-CM | POA: Diagnosis not present

## 2018-07-21 ENCOUNTER — Other Ambulatory Visit: Payer: Self-pay | Admitting: Physician Assistant

## 2018-07-21 DIAGNOSIS — I10 Essential (primary) hypertension: Secondary | ICD-10-CM

## 2018-08-02 ENCOUNTER — Other Ambulatory Visit: Payer: Self-pay | Admitting: Physician Assistant

## 2018-08-02 DIAGNOSIS — E7800 Pure hypercholesterolemia, unspecified: Secondary | ICD-10-CM

## 2018-08-04 ENCOUNTER — Ambulatory Visit: Payer: Medicare HMO | Admitting: Physician Assistant

## 2018-08-06 ENCOUNTER — Encounter: Payer: Self-pay | Admitting: Physician Assistant

## 2018-08-06 ENCOUNTER — Ambulatory Visit (INDEPENDENT_AMBULATORY_CARE_PROVIDER_SITE_OTHER): Payer: Medicare HMO | Admitting: Physician Assistant

## 2018-08-06 VITALS — BP 138/88 | HR 81 | Temp 97.8°F | Wt 239.0 lb

## 2018-08-06 DIAGNOSIS — F419 Anxiety disorder, unspecified: Secondary | ICD-10-CM

## 2018-08-06 DIAGNOSIS — Z0000 Encounter for general adult medical examination without abnormal findings: Secondary | ICD-10-CM

## 2018-08-06 DIAGNOSIS — Z780 Asymptomatic menopausal state: Secondary | ICD-10-CM | POA: Diagnosis not present

## 2018-08-06 DIAGNOSIS — E7800 Pure hypercholesterolemia, unspecified: Secondary | ICD-10-CM | POA: Diagnosis not present

## 2018-08-06 DIAGNOSIS — I10 Essential (primary) hypertension: Secondary | ICD-10-CM

## 2018-08-06 NOTE — Progress Notes (Signed)
Patient: Erin Romero Female    DOB: Jul 02, 1951   67 y.o.   MRN: [id number] Visit Date: 08/06/2018  Today's Provider: Trinna Post, PA-C   Chief Complaint  Patient presents with  . Follow-up  . Hyperlipidemia   Subjective:    HPI   Lipid/Cholesterol, Follow-up:   Last seen for this 2 months ago.  Management since that visit includes; currently taking atorvastatin 10 mg qd.  Last Lipid Panel:    Component Value Date/Time   CHOL 192 11/27/2017 1149   TRIG 233 (H) 11/27/2017 1149   HDL 55 11/27/2017 1149   CHOLHDL 3.5 11/27/2017 1149   LDLCALC 102 (H) 11/27/2017 1149    She reports good compliance with treatment. She is not having side effects. none  Wt Readings from Last 3 Encounters:  08/06/18 239 lb (108.4 kg)  06/15/18 240 lb (108.9 kg)  05/28/18 236 lb (107 kg)    ---------------------------------------------------------------    Allergies  Allergen Reactions  . Taxotere [Docetaxel] Itching and Rash  . Sulfa Antibiotics Hives     Current Outpatient Medications:  .  acetaminophen (TYLENOL) 500 MG tablet, Take 500-1,000 mg by mouth every 4 (four) hours as needed for pain (q 4-6 prn)., Disp: , Rfl:  .  amLODipine (NORVASC) 2.5 MG tablet, TAKE 1 TABLET EVERY DAY, Disp: 90 tablet, Rfl: 3 .  Ascorbic Acid (VITAMIN C) 1000 MG tablet, Take 500 mg by mouth daily. , Disp: , Rfl:  .  aspirin 81 MG chewable tablet, Chew 81 mg by mouth daily., Disp: , Rfl:  .  atorvastatin (LIPITOR) 10 MG tablet, TAKE 1 TABLET (10 MG TOTAL) BY MOUTH DAILY., Disp: 90 tablet, Rfl: 0 .  bisoprolol-hydrochlorothiazide (ZIAC) 10-6.25 MG tablet, Take 1 tablet by mouth daily., Disp: 90 tablet, Rfl: 1 .  Calcium Carb-Cholecalciferol (OYSTER SHELL CALCIUM + D PO), Take 1 tablet by mouth daily. , Disp: , Rfl:  .  Cholecalciferol (VITAMIN D-3 PO), Take 1,000 Units by mouth daily. , Disp: , Rfl:  .  docusate sodium (COLACE) 100 MG capsule, Take 100 mg by mouth daily. , Disp: ,  Rfl:  .  escitalopram (LEXAPRO) 10 MG tablet, TAKE 1 TABLET EVERY DAY, Disp: 90 tablet, Rfl: 4 .  hydrochlorothiazide (HYDRODIURIL) 12.5 MG tablet, TAKE 1 TABLET EVERY DAY, Disp: 90 tablet, Rfl: 1 .  KLOR-CON M20 20 MEQ tablet, TAKE 1 TABLET EVERY DAY, Disp: 90 tablet, Rfl: 4 .  letrozole (FEMARA) 2.5 MG tablet, TAKE 1 TABLET EVERY DAY, Disp: 90 tablet, Rfl: 0 .  lisinopril (PRINIVIL,ZESTRIL) 20 MG tablet, TAKE 1 TABLET EVERY DAY, Disp: 90 tablet, Rfl: 0 .  omeprazole (PRILOSEC) 20 MG capsule, TAKE 1 CAPSULE EVERY DAY, Disp: 90 capsule, Rfl: 0 .  PNEUMOVAX 23 25 MCG/0.5ML injection, inject 0.5 milliliter intramuscularly, Disp: , Rfl: 0 .  traZODone (DESYREL) 50 MG tablet, Take 1 tablet (50 mg total) by mouth daily., Disp: 90 tablet, Rfl: 1 .  warfarin (COUMADIN) 2 MG tablet, Take 2 mg by mouth daily., Disp: , Rfl:   Review of Systems  Constitutional: Negative for appetite change, chills, fatigue and fever.  Respiratory: Negative for chest tightness and shortness of breath.   Cardiovascular: Negative for chest pain and palpitations.  Gastrointestinal: Negative for abdominal pain, nausea and vomiting.  Neurological: Negative for dizziness and weakness.    Social History   Tobacco Use  . Smoking status: Never Smoker  . Smokeless tobacco: Never Used  Substance Use Topics  .  Alcohol use: No   Objective:   Wt 239 lb (108.4 kg)   BMI 41.02 kg/m  Vitals:   08/06/18 1113  Weight: 239 lb (108.4 kg)     Physical Exam      Assessment & Plan:           Trinna Post, PA-C  [facility]

## 2018-08-06 NOTE — Patient Instructions (Signed)

## 2018-08-06 NOTE — Progress Notes (Signed)
   Subjective:    Patient ID: Erin Romero, female    DOB: 08/22/1951, 67 y.o.   MRN: [id number]  Erin Romero is a 67 y.o. female presenting on 08/06/2018 for Follow-up and Hyperlipidemia   HPI   CPE:   Mammogram: 09/2017 normal, due this year 2/2 breast cancer ordered by Dr. Bary Castilla Colonoscopy: 2016 PAP: not indicated DEXA: due   HTN  Dr. Ubaldo Glassing has recently increased blood pressure regimen and added back Ziac 10-6.25 mg. Also takes potassium.   BP Readings from Last 3 Encounters:  08/06/18 138/88  06/15/18 (!) 162/76  05/28/18 138/86   Wt Readings from Last 3 Encounters:  08/06/18 239 lb (108.4 kg)  06/15/18 240 lb (108.9 kg)  05/28/18 236 lb (107 kg)   HLD  Lipid Panel     Component Value Date/Time   CHOL 139 08/06/2018 1146   TRIG 194 (H) 08/06/2018 1146   HDL 47 08/06/2018 1146   CHOLHDL 3.0 08/06/2018 1146   CHOLHDL 3.5 11/27/2017 1149   LDLCALC 53 08/06/2018 1146   LDLCALC 102 (H) 11/27/2017 1149   Depression: Well controlled on Lexparo 10 mg.    Social History   Tobacco Use  . Smoking status: Never Smoker  . Smokeless tobacco: Never Used  Substance Use Topics  . Alcohol use: No  . Drug use: No    Review of Systems Per HPI unless specifically indicated above     Objective:    BP 138/88 (BP Location: Right Arm, Patient Position: Sitting, Cuff Size: Large)   Pulse 81   Temp 97.8 F (36.6 C) (Oral)   Wt 239 lb (108.4 kg)   SpO2 96%   BMI 41.02 kg/m   Wt Readings from Last 3 Encounters:  08/06/18 239 lb (108.4 kg)  06/15/18 240 lb (108.9 kg)  05/28/18 236 lb (107 kg)    Physical Exam  Constitutional: She is oriented to person, place, and time. She appears well-developed and well-nourished.  Cardiovascular: Normal rate and regular rhythm.  Pulmonary/Chest: Effort normal and breath sounds normal.  Neurological: She is alert and oriented to person, place, and time.  Skin: Skin is warm and dry.  Psychiatric: She has a normal  mood and affect. Her behavior is normal.   Results for orders placed or performed in visit on 08/06/18  Lipid Profile  Result Value Ref Range   Cholesterol, Total 139 100 - 199 mg/dL   Triglycerides 194 (H) 0 - 149 mg/dL   HDL 47 >39 mg/dL   VLDL Cholesterol Cal 39 5 - 40 mg/dL   LDL Calculated 53 0 - 99 mg/dL   Chol/HDL Ratio 3.0 0.0 - 4.4 ratio      Assessment & Plan:  1. Annual physical exam    2. Hypercholesteremia  Continue medications.  - Lipid Profile  3. Postmenopausal  - DG Bone Density; Future  4. Essential hypertension  Controlled, continue medications.   5. Anxiety  Controlled on Lipitor 10 mg.   The entirety of the information documented in the History of Present Illness, Review of Systems and Physical Exam were personally obtained by me. Portions of this information were initially documented by April M. Sabra Heck, CMA and reviewed by me for thoroughness and accuracy.   Follow up plan: Return in about 3 months (around 11/06/2018) for HTN, HLD.  Carles Collet, PA-C [facility] 08/12/2018, 12:31 PM

## 2018-08-07 LAB — LIPID PANEL
Chol/HDL Ratio: 3 ratio (ref 0.0–4.4)
Cholesterol, Total: 139 mg/dL (ref 100–199)
HDL: 47 mg/dL (ref >39)
LDL Calculated: 53 mg/dL (ref 0–99)
Triglycerides: 194 mg/dL — ABNORMAL HIGH (ref 0–149)
VLDL Cholesterol Cal: 39 mg/dL (ref 5–40)

## 2018-08-10 ENCOUNTER — Telehealth: Payer: Self-pay

## 2018-08-10 NOTE — Telephone Encounter (Signed)
-----   Message from Trinna Post, [place] sent at 08/09/2018  5:02 PM EDT ----- Cholesterol much improved. Continue current medication.

## 2018-08-10 NOTE — Telephone Encounter (Signed)
Left message advising pt.   Thanks,   -Marlyn Tondreau  

## 2018-08-11 ENCOUNTER — Other Ambulatory Visit: Payer: Self-pay | Admitting: Physician Assistant

## 2018-08-12 DIAGNOSIS — I4891 Unspecified atrial fibrillation: Secondary | ICD-10-CM | POA: Diagnosis not present

## 2018-08-30 ENCOUNTER — Other Ambulatory Visit: Payer: Self-pay

## 2018-08-30 DIAGNOSIS — Z1231 Encounter for screening mammogram for malignant neoplasm of breast: Secondary | ICD-10-CM

## 2018-08-30 NOTE — Progress Notes (Signed)
Order placed for mammogram.

## 2018-09-01 ENCOUNTER — Other Ambulatory Visit: Payer: Self-pay | Admitting: Physician Assistant

## 2018-09-01 DIAGNOSIS — G4700 Insomnia, unspecified: Secondary | ICD-10-CM

## 2018-09-01 NOTE — Telephone Encounter (Signed)
Last (acute) Last routine OV: 08/06/18 Next OV: 02/08/19

## 2018-09-08 ENCOUNTER — Ambulatory Visit
Admission: RE | Admit: 2018-09-08 | Discharge: 2018-09-08 | Disposition: A | Discharge status: Home / Self Care | Payer: Medicare HMO | Source: Ambulatory Visit | Attending: Physician Assistant | Admitting: Physician Assistant

## 2018-09-08 DIAGNOSIS — Z780 Asymptomatic menopausal state: Secondary | ICD-10-CM | POA: Diagnosis not present

## 2018-09-08 DIAGNOSIS — M8588 Other specified disorders of bone density and structure, other site: Secondary | ICD-10-CM | POA: Diagnosis not present

## 2018-09-08 DIAGNOSIS — M810 Age-related osteoporosis without current pathological fracture: Secondary | ICD-10-CM | POA: Diagnosis not present

## 2018-09-13 DIAGNOSIS — I481 Persistent atrial fibrillation: Secondary | ICD-10-CM | POA: Diagnosis not present

## 2018-09-17 ENCOUNTER — Telehealth: Payer: Self-pay

## 2018-09-17 MED ORDER — ALENDRONATE SODIUM 70 MG PO TABS: 70.0000 mg | ORAL_TABLET | ORAL | 11 refills | Status: DC

## 2018-09-17 NOTE — Telephone Encounter (Signed)
Patient advised as below. Patient verbalizes understanding and is in agreement with treatment plan.  

## 2018-09-17 NOTE — Telephone Encounter (Signed)
-----   Message from Trinna Post, [place] sent at 09/16/2018  2:40 PM EDT ----- Patient's bone density scan shows she has transitioned from osteopenia to osteoporosis. We do treat this with medication called Fosamax one pill weekly. Must be able to take this on empty stomach with full glass of water and sit upright 30 min afterwards. If she wants to start this I will send this in and please schedule her f/u in 4 -6 wks.

## 2018-09-20 DIAGNOSIS — R791 Abnormal coagulation profile: Secondary | ICD-10-CM | POA: Diagnosis not present

## 2018-09-28 ENCOUNTER — Other Ambulatory Visit: Payer: Self-pay | Admitting: Physician Assistant

## 2018-09-28 DIAGNOSIS — I10 Essential (primary) hypertension: Secondary | ICD-10-CM

## 2018-09-30 DIAGNOSIS — R791 Abnormal coagulation profile: Secondary | ICD-10-CM | POA: Diagnosis not present

## 2018-10-06 ENCOUNTER — Other Ambulatory Visit: Payer: Self-pay | Admitting: Physician Assistant

## 2018-10-06 ENCOUNTER — Other Ambulatory Visit: Payer: Self-pay | Admitting: Family Medicine

## 2018-10-06 DIAGNOSIS — E7800 Pure hypercholesterolemia, unspecified: Secondary | ICD-10-CM

## 2018-10-06 DIAGNOSIS — F419 Anxiety disorder, unspecified: Secondary | ICD-10-CM

## 2018-10-11 ENCOUNTER — Ambulatory Visit
Admission: RE | Admit: 2018-10-11 | Discharge: 2018-10-11 | Disposition: A | Discharge status: Home / Self Care | Payer: Medicare HMO | Source: Ambulatory Visit | Attending: General Surgery | Admitting: General Surgery

## 2018-10-11 ENCOUNTER — Ambulatory Visit: Payer: Medicare HMO

## 2018-10-11 DIAGNOSIS — Z1231 Encounter for screening mammogram for malignant neoplasm of breast: Secondary | ICD-10-CM | POA: Diagnosis not present

## 2018-10-11 DIAGNOSIS — I10 Essential (primary) hypertension: Secondary | ICD-10-CM | POA: Diagnosis not present

## 2018-10-11 DIAGNOSIS — E7800 Pure hypercholesterolemia, unspecified: Secondary | ICD-10-CM | POA: Diagnosis not present

## 2018-10-11 DIAGNOSIS — Z6841 Body Mass Index (BMI) 40.0 and over, adult: Secondary | ICD-10-CM | POA: Diagnosis not present

## 2018-10-11 DIAGNOSIS — I4891 Unspecified atrial fibrillation: Secondary | ICD-10-CM | POA: Diagnosis not present

## 2018-10-12 ENCOUNTER — Telehealth: Payer: Self-pay | Admitting: Physician Assistant

## 2018-10-12 NOTE — Telephone Encounter (Signed)
Mail order pharmacy has faxed requests for pt's refills. Pt couldn't remember names of medications needing refills.  Please advise.  Thanks, [employer]

## 2018-10-15 ENCOUNTER — Encounter: Payer: Self-pay | Admitting: Physician Assistant

## 2018-10-15 ENCOUNTER — Telehealth: Payer: Self-pay | Admitting: *Deleted

## 2018-10-15 ENCOUNTER — Ambulatory Visit (INDEPENDENT_AMBULATORY_CARE_PROVIDER_SITE_OTHER): Payer: Medicare HMO | Admitting: Physician Assistant

## 2018-10-15 VITALS — BP 127/82 | HR 76 | Temp 98.4°F | Resp 16 | Wt 239.0 lb

## 2018-10-15 DIAGNOSIS — M810 Age-related osteoporosis without current pathological fracture: Secondary | ICD-10-CM | POA: Diagnosis not present

## 2018-10-15 DIAGNOSIS — G4700 Insomnia, unspecified: Secondary | ICD-10-CM

## 2018-10-15 MED ORDER — TRAZODONE HCL 50 MG PO TABS: 50.0000 mg | ORAL_TABLET | Freq: Every day | ORAL | 1 refills | Status: DC

## 2018-10-15 NOTE — Patient Instructions (Signed)
Osteoporosis Osteoporosis happens when your bones become thinner and weaker. Weak bones can break (fracture) more easily when you slip or fall. Bones most at risk of breaking are in the hip, wrist, and spine. Follow these instructions at home:  Get enough calcium and vitamin D. These nutrients are good for your bones.  Exercise as told by your doctor.  Do not use any tobacco products. This includes cigarettes, chewing tobacco, and electronic cigarettes. If you need help quitting, ask your doctor.  Limit the amount of alcohol you drink.  Take medicines only as told by your doctor.  Keep all follow-up visits as told by your doctor. This is important.  Take care at home to prevent falls. Some ways to do this are: ? Keep rooms well lit and tidy. ? Put safety rails on your stairs. ? Put a rubber mat in the bathroom and other places that are often wet or slippery. Get help right away if:  You fall.  You hurt yourself. This information is not intended to replace advice given to you by your health care provider. Make sure you discuss any questions you have with your health care provider. Document Released: 03/01/2012 Document Revised: 05/15/2016 Document Reviewed: 05/18/2014 Elsevier Interactive Patient Education  2018 Elsevier Inc.  

## 2018-10-15 NOTE — Telephone Encounter (Signed)
Per MD to move patient's appt out. I called pt. x3 to R/S her 10/18/18  lab/MD appts. A message was left on patient's vmail in regards to R/S these appts.

## 2018-10-15 NOTE — Telephone Encounter (Signed)
Filled trazodone for patient during her clinic visit.

## 2018-10-15 NOTE — Progress Notes (Signed)
Patient: Erin Romero Female    DOB: [birth year]   67 y.o.   MRN: [id number] Visit Date: 10/15/2018  Today's Provider: Trinna Post, PA-C   Chief Complaint  Patient presents with  . Follow-up   Subjective:    HPI  Follow up for osteoporosis  The patient was started on Fosamax  1 months ago. Changes made at last visit include BMD and starting Fosamax.  She reports excellent compliance with treatment. She feels that condition is Worse. She is having side effects. Pain all over body. Did not pick up Refill.   Insomnia: Requesting refill of trazodone.  ------------------------------------------------------------------------------------      Allergies  Allergen Reactions  . Taxotere [Docetaxel] Itching and Rash  . Sulfa Antibiotics Hives     Current Outpatient Medications:  .  acetaminophen (TYLENOL) 500 MG tablet, Take 500-1,000 mg by mouth every 4 (four) hours as needed for pain (q 4-6 prn)., Disp: , Rfl:  .  alendronate (FOSAMAX) 70 MG tablet, Take 1 tablet (70 mg total) by mouth every 7 (seven) days. Take with a full glass of water on an empty stomach., Disp: 4 tablet, Rfl: 11 .  amLODipine (NORVASC) 2.5 MG tablet, TAKE 1 TABLET EVERY DAY, Disp: 90 tablet, Rfl: 3 .  Ascorbic Acid (VITAMIN C) 1000 MG tablet, Take 500 mg by mouth daily. , Disp: , Rfl:  .  aspirin 81 MG chewable tablet, Chew 81 mg by mouth daily., Disp: , Rfl:  .  atorvastatin (LIPITOR) 10 MG tablet, TAKE 1 TABLET (10 MG TOTAL) BY MOUTH DAILY., Disp: 90 tablet, Rfl: 1 .  bisoprolol-hydrochlorothiazide (ZIAC) 10-6.25 MG tablet, Take 1 tablet by mouth daily., Disp: 90 tablet, Rfl: 1 .  Calcium Carb-Cholecalciferol (OYSTER SHELL CALCIUM + D PO), Take 1 tablet by mouth daily. , Disp: , Rfl:  .  Cholecalciferol (VITAMIN D-3 PO), Take 1,000 Units by mouth daily. , Disp: , Rfl:  .  docusate sodium (COLACE) 100 MG capsule, Take 100 mg by mouth daily. , Disp: , Rfl:  .  escitalopram (LEXAPRO) 10 MG  tablet, TAKE 1 TABLET EVERY DAY, Disp: 90 tablet, Rfl: 1 .  hydrochlorothiazide (HYDRODIURIL) 12.5 MG tablet, TAKE 1 TABLET EVERY DAY, Disp: 90 tablet, Rfl: 1 .  letrozole (FEMARA) 2.5 MG tablet, TAKE 1 TABLET EVERY DAY, Disp: 90 tablet, Rfl: 0 .  lisinopril (PRINIVIL,ZESTRIL) 20 MG tablet, TAKE 1 TABLET EVERY DAY, Disp: 90 tablet, Rfl: 0 .  omeprazole (PRILOSEC) 20 MG capsule, TAKE 1 CAPSULE EVERY DAY, Disp: 90 capsule, Rfl: 1 .  PNEUMOVAX 23 25 MCG/0.5ML injection, inject 0.5 milliliter intramuscularly, Disp: , Rfl: 0 .  potassium chloride SA (K-DUR,KLOR-CON) 20 MEQ tablet, TAKE 1 TABLET EVERY DAY, Disp: 90 tablet, Rfl: 1 .  traZODone (DESYREL) 50 MG tablet, TAKE 1 TABLET (50 MG TOTAL) BY MOUTH DAILY., Disp: 90 tablet, Rfl: 1 .  warfarin (COUMADIN) 2 MG tablet, Take 2 mg by mouth daily., Disp: , Rfl:   Review of Systems  Constitutional: Negative.   Musculoskeletal: Positive for arthralgias, back pain, gait problem and myalgias.    Social History   Tobacco Use  . Smoking status: Never Smoker  . Smokeless tobacco: Never Used  Substance Use Topics  . Alcohol use: No   Objective:   BP 127/82 (BP Location: Left Arm, Patient Position: Sitting, Cuff Size: Large)   Pulse 76   Temp 98.4 F (36.9 C) (Oral)   Resp 16   Wt 239 lb (108.4  kg)   SpO2 99%   BMI 41.02 kg/m  Vitals:   10/15/18 1058  BP: 127/82  Pulse: 76  Resp: 16  Temp: 98.4 F (36.9 C)  TempSrc: Oral  SpO2: 99%  Weight: 239 lb (108.4 kg)     Physical Exam  Constitutional: She is oriented to person, place, and time. She appears well-developed and well-nourished.  Cardiovascular: Normal rate.  Pulmonary/Chest: Effort normal.  Neurological: She is alert and oriented to person, place, and time.  Skin: Skin is warm and dry.  Psychiatric: She has a normal mood and affect. Her behavior is normal.        Assessment & Plan:     1. Osteoporosis, unspecified osteoporosis type, unspecified pathological fracture  presence  Intolerable side effects from Fosamax including bone pain and myalgias. Will stop this medication. She declines referral to endocrinology at this point for further treatment of osteoporosis.   2. Insomnia, unspecified type  - traZODone (DESYREL) 50 MG tablet; Take 1 tablet (50 mg total) by mouth daily.  Dispense: 90 tablet; Refill: 1  Return in about 4 months (around 02/15/2019) for HTN, depression, insomnia .  The entirety of the information documented in the History of Present Illness, Review of Systems and Physical Exam were personally obtained by me. Portions of this information were initially documented by Lynford Humphrey, CMA and reviewed by me for thoroughness and accuracy.         Trinna Post, PA-C  [facility]

## 2018-10-18 ENCOUNTER — Inpatient Hospital Stay: Payer: Medicare HMO

## 2018-10-18 ENCOUNTER — Inpatient Hospital Stay: Payer: Medicare HMO | Admitting: Hematology and Oncology

## 2018-10-19 ENCOUNTER — Ambulatory Visit: Payer: Medicare HMO | Admitting: General Surgery

## 2018-10-19 ENCOUNTER — Encounter: Payer: Self-pay | Admitting: General Surgery

## 2018-10-19 ENCOUNTER — Other Ambulatory Visit: Payer: Self-pay

## 2018-10-19 VITALS — BP 138/68 | HR 76 | Resp 14 | Ht 64.0 in | Wt 239.0 lb

## 2018-10-19 DIAGNOSIS — Z853 Personal history of malignant neoplasm of breast: Secondary | ICD-10-CM | POA: Diagnosis not present

## 2018-10-19 NOTE — Patient Instructions (Addendum)
The patient is aware to call back for any questions or new concerns. The patient has been asked to return to the office in one year with a unilateral left breast screening mammogram.     

## 2018-10-19 NOTE — Progress Notes (Signed)
Patient ID: Erin Romero, female   DOB: August 29, 1951, 67 y.o.   MRN: [id number]  Chief Complaint  Patient presents with  . Follow-up    HPI Erin Romero is a 67 y.o. female.  who presents for her follow up right breast cancer and a breast evaluation. The most recent left mammogram was done on 10-11-18. She admits to "soreness" left lateral breast, but doesn't occur every day. She also admits to some left shoulder discomfort but feels it is the way she sits playing games on her phone. She admits to knee and back pain as well. Patient does not perform regular self breast checks but she gets regular mammograms done.     HPI  Past Medical History:  Diagnosis Date  . Anxiety   . Atrial fibrillation ([place]) 2014  . Breast cancer ([place]) 2014   right breast  . Fibrocystic breast disease 2005  . GERD (gastroesophageal reflux disease)   . Hypertension 2005  . Lump or mass in breast 2014   right mastectomy  . Malignant neoplasm of central portion of female breast ([place]) 01/24/2013   Stage II,T2, N1a. ER/PR 90%, HER-2/neu: Not overexpressed. Mastectomy with sentinel node biopsy. 2 sentinel nodes negative, non-sentinel node w/ 5 mm macrometastatic foci in non-sentinel node.  Received post mastectomy radiation.     Past Surgical History:  Procedure Laterality Date  . ABDOMINAL HYSTERECTOMY  1999  . BREAST BIOPSY Left 2015   BENIGN FIBROTIC BREAST TISSUE WITH CLUSTERED APOCRINE  . BREAST SURGERY Right 2005,2014   biopsy  . BREAST SURGERY Right 2014   mastectomy  . COLONOSCOPY  2002  . COLONOSCOPY WITH PROPOFOL N/A 10/16/2015   Procedure: COLONOSCOPY WITH PROPOFOL;  Surgeon: Robert Bellow, MD;  Location: [facility] ENDOSCOPY;  Service: Endoscopy;  Laterality: N/A;  . KNEE SURGERY Left 2005  . MASTECTOMY Right 2014   radiation chemo  . thumb surg Right 2004  . TONSILLECTOMY     age of 49    Family History  Problem Relation Age of Onset  . Heart disease Father   . Hypertension  Sister   . Cancer Mother        cervical  . Cancer Other        ovarian,liver,colon cancer-no family member listed  . Osteoporosis Paternal Aunt   . Osteoporosis Paternal Grandmother     Social History Social History   Tobacco Use  . Smoking status: Never Smoker  . Smokeless tobacco: Never Used  Substance Use Topics  . Alcohol use: No  . Drug use: No    Allergies  Allergen Reactions  . Taxotere [Docetaxel] Itching and Rash  . Sulfa Antibiotics Hives    Current Outpatient Medications  Medication Sig Dispense Refill  . acetaminophen (TYLENOL) 500 MG tablet Take 500-1,000 mg by mouth every 4 (four) hours as needed for pain (q 4-6 prn).    Marland Kitchen amLODipine (NORVASC) 2.5 MG tablet TAKE 1 TABLET EVERY DAY 90 tablet 3  . aspirin 81 MG chewable tablet Chew 81 mg by mouth daily.    Marland Kitchen atorvastatin (LIPITOR) 10 MG tablet TAKE 1 TABLET (10 MG TOTAL) BY MOUTH DAILY. 90 tablet 1  . bisoprolol-hydrochlorothiazide (ZIAC) 10-6.25 MG tablet Take 1 tablet by mouth daily. 90 tablet 1  . Calcium Carb-Cholecalciferol (OYSTER SHELL CALCIUM + D PO) Take 1 tablet by mouth daily.     . Cholecalciferol (VITAMIN D-3 PO) Take 1,000 Units by mouth daily.     Marland Kitchen docusate sodium (COLACE) 100 MG  capsule Take 100 mg by mouth daily.     Marland Kitchen escitalopram (LEXAPRO) 10 MG tablet TAKE 1 TABLET EVERY DAY 90 tablet 1  . hydrochlorothiazide (HYDRODIURIL) 12.5 MG tablet TAKE 1 TABLET EVERY DAY 90 tablet 1  . lisinopril (PRINIVIL,ZESTRIL) 20 MG tablet TAKE 1 TABLET EVERY DAY 90 tablet 0  . omeprazole (PRILOSEC) 20 MG capsule TAKE 1 CAPSULE EVERY DAY 90 capsule 1  . potassium chloride SA (K-DUR,KLOR-CON) 20 MEQ tablet TAKE 1 TABLET EVERY DAY 90 tablet 1  . traZODone (DESYREL) 50 MG tablet Take 1 tablet (50 mg total) by mouth daily. 90 tablet 1  . PNEUMOVAX 23 25 MCG/0.5ML injection inject 0.5 milliliter intramuscularly  0  . warfarin (COUMADIN) 2 MG tablet Take 1 mg by mouth daily.      No current facility-administered  medications for this visit.     Review of Systems Review of Systems  Constitutional: Negative.   Respiratory: Negative.   Cardiovascular: Negative.     Blood pressure 138/68, pulse 76, resp. rate 14, height _0  (1.626 m), weight 239 lb (108.4 kg), SpO2 98 %.  Physical Exam Physical Exam  Constitutional: She is oriented to person, place, and time. She appears well-developed and well-nourished.  HENT:  Mouth/Throat: Oropharynx is clear and moist.  Eyes: Conjunctivae are normal. No scleral icterus.  Neck: Neck supple.  Cardiovascular: Normal rate and normal heart sounds. An irregular rhythm present.  Pulmonary/Chest: Effort normal and breath sounds normal. Left breast exhibits no inverted nipple, no mass, no nipple discharge, no skin change and no tenderness.  The left breast is soft and supple without mass or thickening.    Lymphadenopathy:    She has no cervical adenopathy.    She has no axillary adenopathy.       Right: No supraclavicular adenopathy present.  Neurological: She is alert and oriented to person, place, and time.  Skin: Skin is warm and dry.  Psychiatric: Her behavior is normal.    Data Reviewed Left screening mammogram dated October 11, 2018 reviewed: BI-RADS-1.  Medical oncology notes from spring 2019 reviewed.  Recommendation for BCI testing for the possibility of extended antiestrogen therapy reviewed.  Patient declined at that time.  Assessment    Doing well now 5 years out from management of a right breast cancer with mastectomy, adjuvant chemotherapy and radiation therapy.  Candidate for BCI testing.    Plan    The patient reported that she had completed her available prescription of letrozole.  We talked about the rationale behind BCI testing to determine if she would benefit from additional 5 years of therapy.  Today she is not opposed to the idea, and she will discuss at her upcoming appointment with Dr. Mike Gip next month.  The patient has  been asked to return to the office in one year with a unilateral left breast screening mammogram. The patient is aware to call back for any questions or new concerns.   Discussed Breast Cancer Index testing, upcoming appointment with Dr Mike Gip is 10-29-18      HPI, Physical Exam, Assessment and Plan have been scribed under the direction and in the presence of Robert Bellow, MD. Karie Fetch, RN   I have completed the exam and reviewed the above documentation for accuracy and completeness.  I agree with the above.  Haematologist has been used and any errors in dictation or transcription are unintentional.  Hervey Ard, M.D., F.A.C.S.  Erin Romero Erin Romero 10/20/2018, 5:26 AM

## 2018-10-29 ENCOUNTER — Other Ambulatory Visit: Payer: Self-pay

## 2018-10-29 ENCOUNTER — Inpatient Hospital Stay: Payer: Medicare HMO | Attending: Hematology and Oncology

## 2018-10-29 ENCOUNTER — Encounter: Payer: Self-pay | Admitting: Hematology and Oncology

## 2018-10-29 ENCOUNTER — Inpatient Hospital Stay: Payer: Medicare HMO | Admitting: Hematology and Oncology

## 2018-10-29 VITALS — BP 160/88 | HR 72 | Temp 97.9°F | Resp 18 | Wt 238.1 lb

## 2018-10-29 DIAGNOSIS — Z79899 Other long term (current) drug therapy: Secondary | ICD-10-CM | POA: Insufficient documentation

## 2018-10-29 DIAGNOSIS — Z9221 Personal history of antineoplastic chemotherapy: Secondary | ICD-10-CM | POA: Insufficient documentation

## 2018-10-29 DIAGNOSIS — Z853 Personal history of malignant neoplasm of breast: Secondary | ICD-10-CM | POA: Insufficient documentation

## 2018-10-29 DIAGNOSIS — M810 Age-related osteoporosis without current pathological fracture: Secondary | ICD-10-CM | POA: Insufficient documentation

## 2018-10-29 DIAGNOSIS — Z9011 Acquired absence of right breast and nipple: Secondary | ICD-10-CM | POA: Insufficient documentation

## 2018-10-29 DIAGNOSIS — Z170 Estrogen receptor positive status [ER+]: Secondary | ICD-10-CM

## 2018-10-29 DIAGNOSIS — C50111 Malignant neoplasm of central portion of right female breast: Secondary | ICD-10-CM | POA: Diagnosis not present

## 2018-10-29 DIAGNOSIS — Z923 Personal history of irradiation: Secondary | ICD-10-CM

## 2018-10-29 DIAGNOSIS — Z79811 Long term (current) use of aromatase inhibitors: Secondary | ICD-10-CM | POA: Diagnosis not present

## 2018-10-29 DIAGNOSIS — M816 Localized osteoporosis [Lequesne]: Secondary | ICD-10-CM

## 2018-10-29 LAB — COMPREHENSIVE METABOLIC PANEL
ALT: 15 U/L (ref 0–44)
AST: 22 U/L (ref 15–41)
Albumin: 3.6 g/dL (ref 3.5–5.0)
Alkaline Phosphatase: 67 U/L (ref 38–126)
Anion gap: 8 (ref 5–15)
BUN: 14 mg/dL (ref 8–23)
CO2: 25 mmol/L (ref 22–32)
Calcium: 9 mg/dL (ref 8.9–10.3)
Chloride: 103 mmol/L (ref 98–111)
Creatinine, Ser: 0.8 mg/dL (ref 0.44–1.00)
GFR calc Af Amer: >60 mL/min (ref >60)
GFR calc non Af Amer: >60 mL/min (ref >60)
Glucose, Bld: 127 mg/dL — ABNORMAL HIGH (ref 70–99)
Potassium: 3.9 mmol/L (ref 3.5–5.1)
Sodium: 136 mmol/L (ref 135–145)
Total Bilirubin: 0.5 mg/dL (ref 0.3–1.2)
Total Protein: 7.3 g/dL (ref 6.5–8.1)

## 2018-10-29 LAB — CBC WITH DIFFERENTIAL/PLATELET
Abs Immature Granulocytes: 0.03 10*3/uL (ref 0.00–0.07)
Basophils Absolute: 0.1 10*3/uL (ref 0.0–0.1)
Basophils Relative: 1 %
Eosinophils Absolute: 0.1 10*3/uL (ref 0.0–0.5)
Eosinophils Relative: 1 %
HCT: 36.2 % (ref 36.0–46.0)
Hemoglobin: 12.1 g/dL (ref 12.0–15.0)
Immature Granulocytes: 0 %
Lymphocytes Relative: 21 %
Lymphs Abs: 1.9 10*3/uL (ref 0.7–4.0)
MCH: 28.7 pg (ref 26.0–34.0)
MCHC: 33.4 g/dL (ref 30.0–36.0)
MCV: 86 fL (ref 80.0–100.0)
Monocytes Absolute: 0.8 10*3/uL (ref 0.1–1.0)
Monocytes Relative: 8 %
Neutro Abs: 6.2 10*3/uL (ref 1.7–7.7)
Neutrophils Relative %: 69 %
Platelets: 210 10*3/uL (ref 150–400)
RBC: 4.21 MIL/uL (ref 3.87–5.11)
RDW: 15 % (ref 11.5–15.5)
WBC: 9 10*3/uL (ref 4.0–10.5)
nRBC: 0 % (ref 0.0–0.2)

## 2018-10-29 NOTE — Progress Notes (Signed)
[facility] day:  10/29/18   Chief Complaint: Erin Romero is a 67 y.o. female with stage IIB right breast cancer who is seen for 8 month assessment.  HPI: The patient was last seen in the medical oncology clinic on 02/02/2018.  At that time, she was doing well.  She was having increased arthritic pain, especially when it rained. Exam was stable.  CA27.29 was normal.  She declined BCI testing.  Bone density on 09/08/2018 revealed osteoporosis with a T-score of -2.5 in the left femoral neck and -2.2 in AP spine L2-L4.  Left breast screening mammogram on 10/11/2018 revealed no evidence of malignancy.  She saw Dr. Bary Castilla on 10/19/2018.  Notes reviewed.  She was doing well.  She described intermittent soreness in her left lateral breast.  BCI testing was again discussed.  She seemed more open to further discussions.  During the interim, patient notes that "Erin Romero is killing me". She complains of pain in her back and knees. Patient denies that she has experienced any B symptoms. She denies any interval infections.  Patient does not verbalize any concerns with regards to her breasts today. Patient does not perform monthly self breast examinations as recommended. She states, "They feel yucky and lumpy to me. I don't see the need". Patient has not taken her letrozole in over a month.   Patient was on weekly bisphosphonate x 4 weeks. She is unsure the name of her medication. Patient notes that she discontinued the medication due to joint pains. She states, "She was going to send me to another doctor about my bones, but I asked her to wait until the spring. Cold weather makes my bones hurt". Patient unable to use antiinflammatory medications due to her bone pain. She is unable to use other pain medications due to her chronic anticoagulation (warfarin) therapy. Patient has been taking APAP arthritis 650 mg x 3 tabs all at one time.   Patient has been unable  to speak to a "live human" at [facility] to discuss cost associated with recommended BCI testing.   Patient advises that she maintains an adequate appetite. She is eating well. Weight today is 238 lb 2 oz (108 kg), which compared to her last visit to the clinic, represents a 3 pound increase.   Patient denies pain in the clinic today.   Past Medical History:  Diagnosis Date  . Anxiety   . Atrial fibrillation ([place]) 2014  . Breast cancer ([place]) 2014   right breast  . Fibrocystic breast disease 2005  . GERD (gastroesophageal reflux disease)   . Hypertension 2005  . Lump or mass in breast 2014   right mastectomy  . Malignant neoplasm of central portion of female breast ([place]) 01/24/2013   Stage II,T2, N1a. ER/PR 90%, HER-2/neu: Not overexpressed. Mastectomy with sentinel node biopsy. 2 sentinel nodes negative, non-sentinel node w/ 5 mm macrometastatic foci in non-sentinel node.  Received post mastectomy radiation.     Past Surgical History:  Procedure Laterality Date  . ABDOMINAL HYSTERECTOMY  1999  . BREAST BIOPSY Left 2015   BENIGN FIBROTIC BREAST TISSUE WITH CLUSTERED APOCRINE  . BREAST SURGERY Right 2005,2014   biopsy  . BREAST SURGERY Right 2014   mastectomy  . COLONOSCOPY  2002  . COLONOSCOPY WITH PROPOFOL N/A 10/16/2015   Procedure: COLONOSCOPY WITH PROPOFOL;  Surgeon: Robert Bellow, MD;  Location: [facility] ENDOSCOPY;  Service: Endoscopy;  Laterality: N/A;  . KNEE SURGERY Left 2005  .  MASTECTOMY Right 2014   radiation chemo  . thumb surg Right 2004  . TONSILLECTOMY     age of 24    Family History  Problem Relation Age of Onset  . Heart disease Father   . Hypertension Sister   . Cancer Mother        cervical  . Cancer Other        ovarian,liver,colon cancer-no family member listed  . Osteoporosis Paternal Aunt   . Osteoporosis Paternal Grandmother     Social History:  reports that she has never smoked. She has never used smokeless tobacco. She reports that she does  not drink alcohol or use drugs.  Her husband, Erin Romero, died 05-17-15.  She takes care of her mother-in-law (she can not see and needs everything done for her).  She lives in [place].  She ha a fluffy miniature daschund.  The patient is alone today.  Allergies:  Allergies  Allergen Reactions  . Taxotere [Docetaxel] Itching and Rash  . Sulfa Antibiotics Hives    Current Medications: Current Outpatient Medications  Medication Sig Dispense Refill  . acetaminophen (TYLENOL) 500 MG tablet Take 500-1,000 mg by mouth every 4 (four) hours as needed for pain (q 4-6 prn).    Marland Kitchen amLODipine (NORVASC) 2.5 MG tablet TAKE 1 TABLET EVERY DAY 90 tablet 3  . aspirin 81 MG chewable tablet Chew 81 mg by mouth daily.    Marland Kitchen atorvastatin (LIPITOR) 10 MG tablet TAKE 1 TABLET (10 MG TOTAL) BY MOUTH DAILY. 90 tablet 1  . bisoprolol-hydrochlorothiazide (ZIAC) 10-6.25 MG tablet Take 1 tablet by mouth daily. 90 tablet 1  . Calcium Carb-Cholecalciferol (OYSTER SHELL CALCIUM + D PO) Take 1 tablet by mouth daily.     . Cholecalciferol (VITAMIN D-3 PO) Take 1,000 Units by mouth daily.     Marland Kitchen docusate sodium (COLACE) 100 MG capsule Take 100 mg by mouth daily.     Marland Kitchen escitalopram (LEXAPRO) 10 MG tablet TAKE 1 TABLET EVERY DAY 90 tablet 1  . hydrochlorothiazide (HYDRODIURIL) 12.5 MG tablet TAKE 1 TABLET EVERY DAY 90 tablet 1  . lisinopril (PRINIVIL,ZESTRIL) 20 MG tablet TAKE 1 TABLET EVERY DAY 90 tablet 0  . omeprazole (PRILOSEC) 20 MG capsule TAKE 1 CAPSULE EVERY DAY 90 capsule 1  . potassium chloride SA (K-DUR,KLOR-CON) 20 MEQ tablet TAKE 1 TABLET EVERY DAY 90 tablet 1  . traZODone (DESYREL) 50 MG tablet Take 1 tablet (50 mg total) by mouth daily. 90 tablet 1  . warfarin (COUMADIN) 2 MG tablet Take 1 mg by mouth daily.      No current facility-administered medications for this visit.     Review of Systems:  GENERAL:  Feels"ok".  No fevers, sweats or weight loss.  Weight up 3 pounds. PERFORMANCE STATUS (ECOG):   1 HEENT:  No visual changes, runny nose, sore throat, mouth sores or tenderness. Lungs: No shortness of breath or cough.  No hemoptysis. Cardiac:  No chest pain, palpitations, orthopnea, or PND. GI:  No nausea, vomiting, diarrhea, constipation, melena or hematochezia. GU:  No urgency, frequency, dysuria, or hematuria. Musculoskeletal:  No back pain.  No joint pain.  No muscle tenderness. Extremities:  No pain or swelling. Skin:  No rashes or skin changes. Picks at skin. Neuro:  No headache, numbness or weakness, balance or coordination issues. Endocrine:  No diabetes, thyroid issues, hot flashes or night sweats. Psych:  No mood changes, depression or anxiety. Pain:  Arthritis pain. Review of systems:  All other systems reviewed  and found to be negative.   Physical Exam: Blood pressure (!) 160/88, pulse 72, temperature 97.9 F (36.6 C), temperature source Tympanic, resp. rate 18, weight 238 lb 2 oz (108 kg). GENERAL:  Well developed, well nourished, woman sitting comfortably in the exam room in no acute distress. MENTAL STATUS:  Alert and oriented to person, place and time. HEAD:  Shoulder length brown hair.  Normocephalic, atraumatic, face symmetric, no Cushingoid features. EYES:  Glasses. Pupils equal round and reactive to light and accomodation.  No conjunctivitis or scleral icterus. ENT:  Oropharynx clear without lesion.  Tongue normal. Mucous membranes moist.  RESPIRATORY:  Clear to auscultation without rales, wheezes or rhonchi. CARDIOVASCULAR:  Regular rate and rhythm without murmur, rub or gallop. BREAST:  Right mastectomy.  No erythema or nodularity.  Left breast without masses, skin changes or nipple discharge. ABDOMEN:  Soft, non-tender, with active bowel sounds, and no appreciable hepatosplenomegaly.  No masses. SKIN:  No rashes, ulcers or lesions. EXTREMITIES: No edema, no skin discoloration or tenderness.  No palpable cords. LYMPH NODES: No palpable cervical,  supraclavicular, axillary or inguinal adenopathy  NEUROLOGICAL: Unremarkable. PSYCH:  Appropriate.    Orders Only on 10/29/2018  Component Date Value Ref Range Status  . Sodium 10/29/2018 136  135 - 145 mmol/L Final  . Potassium 10/29/2018 3.9  3.5 - 5.1 mmol/L Final  . Chloride 10/29/2018 103  98 - 111 mmol/L Final  . CO2 10/29/2018 25  22 - 32 mmol/L Final  . Glucose, Bld 10/29/2018 127* 70 - 99 mg/dL Final  . BUN 10/29/2018 14  8 - 23 mg/dL Final  . Creatinine, Ser 10/29/2018 0.80  0.44 - 1.00 mg/dL Final  . Calcium 10/29/2018 9.0  8.9 - 10.3 mg/dL Final  . Total Protein 10/29/2018 7.3  6.5 - 8.1 g/dL Final  . Albumin 10/29/2018 3.6  3.5 - 5.0 g/dL Final  . AST 10/29/2018 22  15 - 41 U/L Final  . ALT 10/29/2018 15  0 - 44 U/L Final  . Alkaline Phosphatase 10/29/2018 67  38 - 126 U/L Final  . Total Bilirubin 10/29/2018 0.5  0.3 - 1.2 mg/dL Final  . GFR calc non Af Amer 10/29/2018 >60  >60 mL/min Final  . GFR calc Af Amer 10/29/2018 >60  >60 mL/min Final   Comment: (NOTE) The eGFR has been calculated using the CKD EPI equation. This calculation has not been validated in all clinical situations. eGFR's persistently <60 mL/min signify possible Chronic Kidney Disease.   Georgiann Hahn gap 10/29/2018 8  5 - 15 Final   Performed at [facility], [address] [place], [place] [address]  . WBC 10/29/2018 9.0  4.0 - 10.5 K/uL Final  . RBC 10/29/2018 4.21  3.87 - 5.11 MIL/uL Final  . Hemoglobin 10/29/2018 12.1  12.0 - 15.0 g/dL Final  . HCT 10/29/2018 36.2  36.0 - 46.0 % Final  . MCV 10/29/2018 86.0  80.0 - 100.0 fL Final  . MCH 10/29/2018 28.7  26.0 - 34.0 pg Final  . MCHC 10/29/2018 33.4  30.0 - 36.0 g/dL Final  . RDW 10/29/2018 15.0  11.5 - 15.5 % Final  . Platelets 10/29/2018 210  150 - 400 K/uL Final  . nRBC 10/29/2018 0.0  0.0 - 0.2 % Final  . Neutrophils Relative % 10/29/2018 69  % Final  . Neutro Abs 10/29/2018 6.2  1.7 - 7.7 K/uL Final  . Lymphocytes Relative 10/29/2018  21  % Final  . Lymphs Abs 10/29/2018  1.9  0.7 - 4.0 K/uL Final  . Monocytes Relative 10/29/2018 8  % Final  . Monocytes Absolute 10/29/2018 0.8  0.1 - 1.0 K/uL Final  . Eosinophils Relative 10/29/2018 1  % Final  . Eosinophils Absolute 10/29/2018 0.1  0.0 - 0.5 K/uL Final  . Basophils Relative 10/29/2018 1  % Final  . Basophils Absolute 10/29/2018 0.1  0.0 - 0.1 K/uL Final  . Immature Granulocytes 10/29/2018 0  % Final  . Abs Immature Granulocytes 10/29/2018 0.03  0.00 - 0.07 K/uL Final   Performed at [facility] [address], [place], [place] [address]    Assessment:  Erin Romero is a 67 y.o. female with a history of stage IIB (T2N1M0) right breast cancer status post radical mastectomy on 01/24/2013. Pathology revealed a 3 cm grade II invasive mammary carcinoma.  Zero of 2 sentinel lymph nodes were positive.  A non-sentinel lymph node was positive for a 0.5 cm macrometastasis with extracapsular extension.  DCIS was present. Estrogen receptor was > 90%, progesterone receptor greater than 90%, and HER-2/neu negative.  She is s/p 3 cycles of Taxotere and Cytoxan (03/08/2013 - 04/20/2013). Treatment was complicated by erythromelalgia and an urticarial reaction. She did not receive any further chemotherapy.  She received radiation from 05/30/2013 until 07/19/2013. She began Femara.  Femara was discontinued in 09/2018.  Mammogram on 02/06/2014 was suspicious for left breast mass.  Left breast biopsy on 02/27/2014 was negative for malignancy.  Left-sided mammogram on 10/05/2017 was negative.   Left breast screening mammogram on 10/11/2018 revealed no evidence of malignancy.  CA27.29 was 24.9 on 12/20/2014, 28.8 on 05/22/2015, 26.3 on 11/26/2015, 31.3 on 05/26/2016, 22.0 on 11/24/2016, 23.5 on 08/03/2017, 22.2 on 02/02/2018, and 19.7 on 10/29/2018.  Bone density study on 06/05/2015 revealed osteopenia with a T score of -1.9 in the left femoral neck.  Bone density on 09/08/2018  revealed osteoporosis with a T-score of -2.5 in the left femoral neck and -2.2 in AP spine L2-L4.  Patient is on calcium and vitamin D.  She was on an oral bisphosphonate x 1 month.  Patient previosly declined Prolia.  Symptomatically, she is doing well.  She denies any breast concerns.  Exam is stable.  Plan: 1.  Labs today:  CBC with diff, CMP, CA27.29. 2.  Stage IIB right breast cancer:  CA27.29 is normal.  Exam reveals no evidence of disease.  Review interval mammogram- no evidence of disease.  Letrozole stopped 1 month ago.  Discuss BCI testing regarding extended adjuvant endocrine therapy.  Provide additional information. 3. Osteoporosis:  Interval bone density reveals progression from osteopenia to osteoporosis.  Patient on calcium and vitamin D.  Patient declined Prolia or a bisphosphonate in the past.  Discuss Prolia again.  Side effects reviewed.  Information provided. 4.  RTC in 6 months for MD assessment and labs (CBC with diff, CMP, CA27.29).    Honor Loh, NP  10/29/2018 , 3:20 PM   I saw and evaluated the patient, participating in the key portions of the service and reviewing pertinent diagnostic studies and records.  I reviewed the nurse practitioner's note and agree with the findings and the plan.  The assessment and plan were discussed with the patient.  Multiple questions were asked by the patient and answered.   Nolon Stalls, MD 10/29/2018,3:20 PM

## 2018-10-29 NOTE — Patient Instructions (Signed)

## 2018-10-30 LAB — CA 27.29 (SERIAL MONITOR): CA 27.29: 19.7 U/mL (ref 0.0–38.6)

## 2018-11-01 ENCOUNTER — Telehealth: Payer: Self-pay | Admitting: *Deleted

## 2018-11-01 DIAGNOSIS — I4819 Other persistent atrial fibrillation: Secondary | ICD-10-CM | POA: Diagnosis not present

## 2018-11-01 DIAGNOSIS — R791 Abnormal coagulation profile: Secondary | ICD-10-CM | POA: Diagnosis not present

## 2018-11-01 NOTE — Telephone Encounter (Signed)
Patient called and states to go ahead with breast cancer testing, her insurance company said it would be a $35 copay for her

## 2018-11-01 NOTE — Telephone Encounter (Signed)
  Rodena Piety,  Please complete form for BCI testing.  I will sign.  M

## 2018-11-02 DIAGNOSIS — H40013 Open angle with borderline findings, low risk, bilateral: Secondary | ICD-10-CM | POA: Diagnosis not present

## 2018-11-03 ENCOUNTER — Other Ambulatory Visit: Payer: Self-pay | Admitting: Physician Assistant

## 2018-11-03 DIAGNOSIS — I10 Essential (primary) hypertension: Secondary | ICD-10-CM

## 2018-11-09 DIAGNOSIS — I4891 Unspecified atrial fibrillation: Secondary | ICD-10-CM | POA: Diagnosis not present

## 2018-11-15 NOTE — Telephone Encounter (Signed)
Pt contacted office for refill request on the following medications:  hydrochlorothiazide (HYDRODIURIL) 12.5 MG tablet  [facility]  Please advise. Thanks TNP

## 2018-11-16 DIAGNOSIS — I4819 Other persistent atrial fibrillation: Secondary | ICD-10-CM | POA: Diagnosis not present

## 2018-11-17 ENCOUNTER — Encounter: Payer: Self-pay | Admitting: Hematology and Oncology

## 2018-12-06 ENCOUNTER — Other Ambulatory Visit: Payer: Self-pay | Admitting: Physician Assistant

## 2018-12-06 DIAGNOSIS — I10 Essential (primary) hypertension: Secondary | ICD-10-CM

## 2018-12-20 DIAGNOSIS — I4819 Other persistent atrial fibrillation: Secondary | ICD-10-CM | POA: Diagnosis not present

## 2019-01-03 ENCOUNTER — Other Ambulatory Visit: Payer: Self-pay | Admitting: Physician Assistant

## 2019-01-17 ENCOUNTER — Other Ambulatory Visit: Payer: Self-pay | Admitting: Physician Assistant

## 2019-01-17 DIAGNOSIS — I4819 Other persistent atrial fibrillation: Secondary | ICD-10-CM | POA: Diagnosis not present

## 2019-01-17 DIAGNOSIS — I10 Essential (primary) hypertension: Secondary | ICD-10-CM

## 2019-01-26 DIAGNOSIS — I4819 Other persistent atrial fibrillation: Secondary | ICD-10-CM | POA: Diagnosis not present

## 2019-02-03 DIAGNOSIS — I4819 Other persistent atrial fibrillation: Secondary | ICD-10-CM | POA: Diagnosis not present

## 2019-02-08 ENCOUNTER — Encounter: Payer: Self-pay | Admitting: Physician Assistant

## 2019-02-08 ENCOUNTER — Ambulatory Visit (INDEPENDENT_AMBULATORY_CARE_PROVIDER_SITE_OTHER): Payer: Medicare HMO | Admitting: Physician Assistant

## 2019-02-08 VITALS — BP 136/78 | HR 84 | Temp 98.5°F | Wt 238.0 lb

## 2019-02-08 DIAGNOSIS — I10 Essential (primary) hypertension: Secondary | ICD-10-CM

## 2019-02-08 DIAGNOSIS — F329 Major depressive disorder, single episode, unspecified: Secondary | ICD-10-CM | POA: Diagnosis not present

## 2019-02-08 DIAGNOSIS — F32A Depression, unspecified: Secondary | ICD-10-CM

## 2019-02-08 DIAGNOSIS — G4700 Insomnia, unspecified: Secondary | ICD-10-CM | POA: Diagnosis not present

## 2019-02-08 DIAGNOSIS — E7800 Pure hypercholesterolemia, unspecified: Secondary | ICD-10-CM | POA: Diagnosis not present

## 2019-02-08 NOTE — Progress Notes (Signed)
Patient: Erin Romero Female    DOB: 10-11-1951   68 y.o.   MRN: [id number] Visit Date: 02/08/2019  Today's Provider: Trinna Post, PA-C   Chief Complaint  Patient presents with  . Depression  . Hypertension  . Insomnia   Subjective:    Depression         This is a recurrent problem.  The current episode started more than 1 year ago.   Associated symptoms include insomnia, decreased interest and body aches. She is taking lexapro 10 mg daily.    Hypertension, follow-up:  BP Readings from Last 3 Encounters:  02/08/19 136/78  10/29/18 (!) 160/88  10/19/18 138/68    She was last seen for hypertension 3 months ago.  BP at that visit was 127/82. Management changes since that visit include none. She reports good compliance with treatment. She is not having side effects.  She is not exercising. She is adherent to low salt diet.   Outside blood pressures are n/a. She is experiencing none.  Patient denies chest pain, chest pressure/discomfort, exertional chest pressure/discomfort, fatigue, irregular heart beat, lower extremity edema, palpitations and tachypnea.   Cardiovascular risk factors include advanced age (older than 98 for men, 76 for women) and hypertension.  Use of agents associated with hypertension: none.     Weight trend: fluctuating a bit Wt Readings from Last 3 Encounters:  02/08/19 238 lb (108 kg)  10/29/18 238 lb 2 oz (108 kg)  10/19/18 239 lb (108.4 kg)    Current diet: well balanced  ------------------------------------------------------------------------  Insomnia Follow up  She presents today for follow up of insomnia. She was last seen for this 3 months ago. Management changes included patient is taking Trazodone 50 mg daily. She is not having adverse reaction from treatment.  Insomnia is getting better. She does not have difficulty FALLING asleep. She does not have difficulty STAYING asleep.  She is taking stimulant  medications.  She is not taking new medications:  She is not taking OTC sleeping aid. . She is taking medications to help sleep. She is not drinking alcohol to help sleep. She is not using illicit drugs.  HLD: Currently taking lipitor 10 mg daily.   Lipid Panel     Component Value Date/Time   CHOL 139 08/06/2018 1146   TRIG 194 (H) 08/06/2018 1146   HDL 47 08/06/2018 1146   CHOLHDL 3.0 08/06/2018 1146   CHOLHDL 3.5 11/27/2017 1149   LDLCALC 53 08/06/2018 1146   LDLCALC 102 (H) 11/27/2017 1149    --------------------------------------------------------------------    Allergies  Allergen Reactions  . Taxotere [Docetaxel] Itching and Rash  . Sulfa Antibiotics Hives     Current Outpatient Medications:  .  acetaminophen (TYLENOL) 500 MG tablet, Take 500-1,000 mg by mouth every 4 (four) hours as needed for pain (q 4-6 prn)., Disp: , Rfl:  .  amLODipine (NORVASC) 2.5 MG tablet, TAKE 1 TABLET EVERY DAY, Disp: 90 tablet, Rfl: 3 .  aspirin 81 MG chewable tablet, Chew 81 mg by mouth daily., Disp: , Rfl:  .  atorvastatin (LIPITOR) 10 MG tablet, TAKE 1 TABLET (10 MG TOTAL) BY MOUTH DAILY., Disp: 90 tablet, Rfl: 1 .  bisoprolol-hydrochlorothiazide (ZIAC) 10-6.25 MG tablet, Take 1 tablet by mouth daily., Disp: 90 tablet, Rfl: 1 .  Calcium Carb-Cholecalciferol (OYSTER SHELL CALCIUM + D PO), Take 1 tablet by mouth daily. , Disp: , Rfl:  .  Cholecalciferol (VITAMIN D-3 PO), Take 1,000 Units by mouth  daily. , Disp: , Rfl:  .  docusate sodium (COLACE) 100 MG capsule, Take 100 mg by mouth daily. , Disp: , Rfl:  .  escitalopram (LEXAPRO) 10 MG tablet, TAKE 1 TABLET EVERY DAY, Disp: 90 tablet, Rfl: 1 .  hydrochlorothiazide (HYDRODIURIL) 12.5 MG tablet, TAKE 1 TABLET EVERY DAY, Disp: 90 tablet, Rfl: 0 .  lisinopril (PRINIVIL,ZESTRIL) 20 MG tablet, TAKE 1 TABLET EVERY DAY, Disp: 90 tablet, Rfl: 0 .  omeprazole (PRILOSEC) 20 MG capsule, TAKE 1 CAPSULE EVERY DAY, Disp: 90 capsule, Rfl: 1 .   potassium chloride SA (K-DUR,KLOR-CON) 20 MEQ tablet, TAKE 1 TABLET EVERY DAY, Disp: 90 tablet, Rfl: 1 .  traZODone (DESYREL) 50 MG tablet, Take 1 tablet (50 mg total) by mouth daily., Disp: 90 tablet, Rfl: 1 .  warfarin (COUMADIN) 2 MG tablet, Take 1 mg by mouth daily. , Disp: , Rfl:   Review of Systems  HENT: Negative.   Respiratory: Negative.   Genitourinary: Negative.   Neurological: Negative.   Psychiatric/Behavioral: Positive for depression. The patient has insomnia.     Social History   Tobacco Use  . Smoking status: Never Smoker  . Smokeless tobacco: Never Used  Substance Use Topics  . Alcohol use: No      Objective:   BP 136/78 (BP Location: Left Arm, Patient Position: Sitting, Cuff Size: Large)   Pulse 84   Temp 98.5 F (36.9 C) (Oral)   Wt 238 lb (108 kg)   BMI 40.85 kg/m  Vitals:   02/08/19 1055  BP: 136/78  Pulse: 84  Temp: 98.5 F (36.9 C)  TempSrc: Oral  Weight: 238 lb (108 kg)     Physical Exam Constitutional:      Appearance: Normal appearance.  Cardiovascular:     Rate and Rhythm: Normal rate and regular rhythm.     Heart sounds: Normal heart sounds.  Pulmonary:     Breath sounds: Normal breath sounds.  Skin:    General: Skin is warm and dry.  Neurological:     Mental Status: She is alert and oriented to person, place, and time. Mental status is at baseline.  Psychiatric:        Mood and Affect: Mood normal.        Behavior: Behavior normal.         Assessment & Plan    1. Essential hypertension  Continue current medications.  2. Insomnia, unspecified type  Continue trazodone.  3. Hypercholesteremia  Continue Lipitor.  4. Depression, unspecified depression type  Continue Lexapro.   5. Osteoporosis  Did not tolerate fosamax. Touched base again on prolia, patient says she would still like to think about it.  Return in about 6 months (around 08/09/2019) for CPE and f/u.  The entirety of the information documented in the  History of Present Illness, Review of Systems and Physical Exam were personally obtained by me. Portions of this information were initially documented by [facility], CMA and reviewed by me for thoroughness and accuracy.        Trinna Post, PA-C  [facility]

## 2019-02-08 NOTE — Patient Instructions (Signed)

## 2019-02-09 ENCOUNTER — Other Ambulatory Visit: Payer: Self-pay | Admitting: Physician Assistant

## 2019-02-09 DIAGNOSIS — I10 Essential (primary) hypertension: Secondary | ICD-10-CM

## 2019-03-07 ENCOUNTER — Other Ambulatory Visit: Payer: Self-pay | Admitting: Physician Assistant

## 2019-03-07 DIAGNOSIS — E7800 Pure hypercholesterolemia, unspecified: Secondary | ICD-10-CM

## 2019-03-07 DIAGNOSIS — F419 Anxiety disorder, unspecified: Secondary | ICD-10-CM

## 2019-03-07 DIAGNOSIS — I4819 Other persistent atrial fibrillation: Secondary | ICD-10-CM | POA: Diagnosis not present

## 2019-03-16 DIAGNOSIS — I4819 Other persistent atrial fibrillation: Secondary | ICD-10-CM | POA: Diagnosis not present

## 2019-03-21 ENCOUNTER — Other Ambulatory Visit: Payer: Self-pay | Admitting: Physician Assistant

## 2019-03-21 DIAGNOSIS — G4700 Insomnia, unspecified: Secondary | ICD-10-CM

## 2019-03-21 DIAGNOSIS — I10 Essential (primary) hypertension: Secondary | ICD-10-CM

## 2019-03-21 NOTE — Telephone Encounter (Signed)
Please review

## 2019-03-30 DIAGNOSIS — I4819 Other persistent atrial fibrillation: Secondary | ICD-10-CM | POA: Diagnosis not present

## 2019-03-30 DIAGNOSIS — I10 Essential (primary) hypertension: Secondary | ICD-10-CM | POA: Diagnosis not present

## 2019-03-30 DIAGNOSIS — I4891 Unspecified atrial fibrillation: Secondary | ICD-10-CM | POA: Diagnosis not present

## 2019-03-30 DIAGNOSIS — E7800 Pure hypercholesterolemia, unspecified: Secondary | ICD-10-CM | POA: Diagnosis not present

## 2019-03-30 DIAGNOSIS — Z6841 Body Mass Index (BMI) 40.0 and over, adult: Secondary | ICD-10-CM | POA: Diagnosis not present

## 2019-04-16 ENCOUNTER — Other Ambulatory Visit: Payer: Self-pay | Admitting: Physician Assistant

## 2019-04-16 DIAGNOSIS — I10 Essential (primary) hypertension: Secondary | ICD-10-CM

## 2019-04-29 ENCOUNTER — Other Ambulatory Visit: Payer: Medicare HMO

## 2019-04-29 ENCOUNTER — Ambulatory Visit: Payer: Medicare HMO | Admitting: Hematology and Oncology

## 2019-05-02 DIAGNOSIS — I4891 Unspecified atrial fibrillation: Secondary | ICD-10-CM | POA: Diagnosis not present

## 2019-05-02 DIAGNOSIS — Z7901 Long term (current) use of anticoagulants: Secondary | ICD-10-CM | POA: Diagnosis not present

## 2019-05-09 DIAGNOSIS — H524 Presbyopia: Secondary | ICD-10-CM | POA: Diagnosis not present

## 2019-05-10 DIAGNOSIS — R791 Abnormal coagulation profile: Secondary | ICD-10-CM | POA: Diagnosis not present

## 2019-05-11 ENCOUNTER — Other Ambulatory Visit: Payer: Self-pay | Admitting: Physician Assistant

## 2019-05-13 ENCOUNTER — Other Ambulatory Visit: Payer: Self-pay | Admitting: Physician Assistant

## 2019-05-18 NOTE — Telephone Encounter (Signed)
Okay to fill? KW 

## 2019-05-19 DIAGNOSIS — R791 Abnormal coagulation profile: Secondary | ICD-10-CM | POA: Diagnosis not present

## 2019-06-01 ENCOUNTER — Encounter: Payer: Self-pay | Admitting: Hematology and Oncology

## 2019-06-20 ENCOUNTER — Other Ambulatory Visit: Payer: Self-pay

## 2019-06-20 ENCOUNTER — Ambulatory Visit (INDEPENDENT_AMBULATORY_CARE_PROVIDER_SITE_OTHER): Payer: Medicare HMO

## 2019-06-20 DIAGNOSIS — Z0000 Encounter for general adult medical examination without abnormal findings: Secondary | ICD-10-CM

## 2019-06-20 NOTE — Progress Notes (Signed)
Subjective:   Erin Romero is a 68 y.o. female who presents for Medicare Annual (Subsequent) preventive examination.    This visit is being conducted through telemedicine due to the COVID-19 pandemic. This patient has given me verbal consent via doximity to conduct this visit, patient states they are participating from their home address. Some vital signs may be absent or patient reported.    Patient identification: identified by name, DOB, and current address  Review of Systems:  N/A  Cardiac Risk Factors include: dyslipidemia;obesity (BMI >30kg/m2);advanced age (>51mn, >>46women);hypertension     Objective:     Vitals: There were no vitals taken for this visit.  There is no height or weight on file to calculate BMI. Unable to obtain vitals due to visit being conducted via telephonically.   Advanced Directives 06/20/2019 06/15/2018 08/03/2017 06/25/2017 11/24/2016 11/05/2016 05/26/2016  Does Patient Have a Medical Advance Directive? Yes Yes Yes Yes No No No  Type of AParamed[place] - Living will - - -  Copy of [place] Chart? Yes - validated most recent copy scanned in chart (See row information) Yes - - - - -  Would patient like information on creating a medical advance directive? - - - - No - Patient declined Yes - EScientist, clinical (histocompatibility and immunogenetics)given Yes - EScientist, clinical (histocompatibility and immunogenetics)given    Tobacco Social History   Tobacco Use  Smoking Status Never Smoker  Smokeless Tobacco Never Used     Counseling given: Not Answered   Clinical Intake:  Pre-visit preparation completed: Yes  Pain : No/denies pain Pain Score: 0-No pain     Nutritional Risks: None Diabetes: No  How often do you need to have someone help you when you read instructions, pamphlets, or other written materials from your doctor or pharmacy?: 1 - Never  Interpreter Needed?: No  Information [facility] ::  [facility]  Past Medical History:  Diagnosis Date  . Anxiety   . [place]rial fibrillation [place]  . Breast cancer [place]   right breast  . Fibrocystic breast disease 2005  . GERD (gastroesophageal reflux disease)   . Hypertension 2005  . Lump or mass in breast 2014   right mastectomy  . Malignant ne[facility] of central portion of female breast [place]   Stage II,T2, N1a. ER/PR 90%, HER-2/neu: Not overexpressed. Mastectomy with sentinel node biopsy. 2 sentinel nodes negative, non-sentinel node w/ 5 mm macrometastatic foci in non-sentinel node.  Received post mastectomy radiation.    Past Surgical History:  Procedure Laterality Date  . ABDOMINAL HYSTERECTOMY  1999  . BREAST BIOPSY Left 2015   BENIGN FIBROTIC BREAST TISSUE WITH CLUSTERED APOCRINE  . BREAST SURGERY Right 2005,2014   biopsy  . BREAST SURGERY Right 2014   mastectomy  . COLONOSCOPY  2002  . COLONOSCOPY WITH PROPOFOL N/A 10/16/2015   Procedure: COLONOSCOPY WITH PROPOFOL;  Surgeon: JRobert Bellow MD;  [facility]  Service: Endoscopy;  Laterality: N/A;  . KNEE SURGERY Left 2005  . MASTECTOMY Right 2014   radiation chemo  . thumb surg Right 2004  . TONSILLECTOMY     age of 89  Family History  Problem Relation Age of Onset  . Heart disease Father   . Hypertension Sister   . Cancer Mother        cervical  . Cancer Other        ovarian,liver,colon cancer-no family member listed  .  Osteoporosis Paternal Aunt   . Osteoporosis Paternal Grandmother    Social History   Socioeconomic History  . Marital status: Widowed    Spouse name: Joani Cosma  . Number of children: 0  . Years of education: 14  . Highest education level: 12th grade  Occupational History    Employer: UNEMPLOYED    Comment: disabilty from breast cancer  Social Needs  . Financial resource strain: Not hard at all  . Food insecurity    Worry: Never true    Inability: Never true  . Transportation needs     Medical: No    Non-medical: No  Tobacco Use  . Smoking status: Never Smoker  . Smokeless tobacco: Never Used  Substance and Sexual Activity  . Alcohol use: No  . Drug use: No  . Sexual activity: Not on file  Lifestyle  . Physical activity    Days per week: 0 days    Minutes per session: 0 min  . Stress: Not at all  Relationships  . Social Herbalist on phone: Patient refused    Gets together: Patient refused    Attends religious service: Patient refused    Active member of club or organization: Patient refused    Attends meetings of clubs or organizations: Patient refused    Relationship status: Patient refused  Other Topics Concern  . Not on file  Social History Narrative  . Not on file    Outpatient Encounter Medications as of 06/20/2019  Medication Sig  . acetaminophen (TYLENOL) 500 MG tablet Take 500-1,000 mg by mouth every 4 (four) hours as needed for pain (q 4-6 prn).  Marland Kitchen amLODipine (NORVASC) 2.5 MG tablet TAKE 1 TABLET EVERY DAY  . aspirin 81 MG chewable tablet Chew 81 mg by mouth daily.  Marland Kitchen atorvastatin (LIPITOR) 10 MG tablet TAKE 1 TABLET EVERY DAY  . bisoprolol-hydrochlorothiazide (ZIAC) 10-6.25 MG tablet Take 1 tablet by mouth daily.  . Calcium Carb-Cholecalciferol (OYSTER SHELL CALCIUM + D PO) Take 1 tablet by mouth daily.   . Cholecalciferol (VITAMIN D-3 PO) Take 1,000 Units by mouth daily.   Marland Kitchen docusate sodium (COLACE) 100 MG capsule Take 100 mg by mouth daily as needed.   Marland Kitchen escitalopram (LEXAPRO) 10 MG tablet TAKE 1 TABLET EVERY DAY  . hydrochlorothiazide (HYDRODIURIL) 12.5 MG tablet TAKE 1 TABLET EVERY DAY  . lisinopril (ZESTRIL) 20 MG tablet TAKE 1 TABLET EVERY DAY  . omeprazole (PRILOSEC) 20 MG capsule TAKE 1 CAPSULE EVERY DAY  . potassium chloride SA (K-DUR) 20 MEQ tablet TAKE 1 TABLET EVERY DAY  . traZODone (DESYREL) 50 MG tablet TAKE 1 TABLET (50 MG TOTAL) BY MOUTH DAILY.  Marland Kitchen warfarin (COUMADIN) 1 MG tablet Take 1.5 mg by mouth daily.  Marland Kitchen warfarin  (COUMADIN) 2 MG tablet Take 1 mg by mouth daily.    No facility-administered encounter medications on file as of 06/20/2019.     Activities of Daily Living In your present state of health, do you have any difficulty performing the following activities: 06/20/2019  Hearing? N  Vision? Y  Comment Due to cataracts forming on both eyes.  Difficulty concentrating or making decisions? N  Walking or climbing stairs? Y  Comment Due to pain in knees and SOB.  Dressing or bathing? N  Doing errands, shopping? N  Preparing Food and eating ? N  Using the Toilet? N  In the past six months, have you accidently leaked urine? N  Do you have problems with loss  of bowel control? N  Managing your Medications? N  Managing your Finances? N  Housekeeping or managing your Housekeeping? N  Some recent data might be hidden    Patient Care Team: Paulene Floor as PCP - General (Physician Assistant) Bary Castilla [place] Gleason, MD as Consulting Physician (General Surgery) Teodoro Spray, MD as Consulting Physician (Cardiology) Lequita Asal, MD as Referring Physician (Hematology and Oncology)    Assessment:   This is a routine wellness examination for [place].  Exercise Activities and Dietary recommendations Current Exercise Habits: The patient does not participate in regular exercise at present, Exercise limited by: None identified  Goals    . DIET - REDUCE SUGAR INTAKE     Recommend cutting out sugar and ice cream from daily diet and substituting for sugar free snacks.     . Exercise 150 minutes per week (moderate activity)    . Increase water intake     Recommend increasing water intake to 4 glasses of water a day.        Fall Risk: Fall Risk  06/20/2019 06/15/2018 06/25/2017 11/05/2016 08/22/2015  Falls in the past year? 0 No No No No    FALL RISK PREVENTION PERTAINING TO THE HOME:  Any stairs in or around the home? No  If so, are there any without handrails? N/A  Home free of loose  throw rugs in walkways, pet beds, electrical cords, etc? Yes  Adequate lighting in your home to reduce risk of falls? Yes   ASSISTIVE DEVICES UTILIZED TO PREVENT FALLS:  Life alert? No  Use of a cane, walker or w/c? No  Grab bars in the bathroom? Yes  Shower chair or bench in shower? No  Elevated toilet seat or a handicapped toilet? No    TIMED UP AND GO:  Was the test performed? No .    Depression Screen PHQ 2/9 Scores 06/20/2019 06/15/2018 06/25/2017 05/21/2017  PHQ - 2 Score 0 1 0 1  PHQ- 9 Score - - 1 5     Cognitive Function     6CIT Screen 06/20/2019 06/15/2018 06/25/2017  What Year? 0 points 0 points 0 points  What month? 0 points 0 points 0 points  What time? 0 points 0 points 0 points  Count back from 20 0 points 0 points 0 points  Months in reverse 0 points 0 points 0 points  Repeat phrase 0 points 0 points 4 points  Total Score 0 0 4    Immunization History  Administered Date(s) Administered  . Influenza, High Dose Seasonal PF 09/14/2018  . Influenza,inj,Quad PF,6+ Mos 09/06/2015, 09/21/2017  . Pneumococcal Conjugate-13 08/22/2015  . Pneumococcal Polysaccharide-23 09/03/2016, 09/21/2017    Qualifies for Shingles Vaccine? Yes . Due for Shingrix. Education has been provided regarding the importance of this vaccine. Pt has been advised to call insurance company to determine out of pocket expense. Advised may also receive vaccine at local pharmacy or Health Dept. Verbalized acceptance and understanding.  Tdap: Although this vaccine is not a covered service during a Wellness Exam, does the patient still wish to receive this vaccine today?  No .  Education has been provided regarding the importance of this vaccine. Advised may receive this vaccine at local pharmacy or Health Dept. Aware to provide a copy of the vaccination record if obtained from local pharmacy or Health Dept. Verbalized acceptance and understanding.  Flu Vaccine: Due fall 2020  Pneumococcal Vaccine:  Completed series  Screening Tests Health Maintenance  Topic Date  Due  . TETANUS/TDAP  12/22/2026 (Originally 03/23/1970)  . INFLUENZA VACCINE  07/23/2019  . DEXA SCAN  09/08/2020  . MAMMOGRAM  10/11/2020  . COLONOSCOPY  10/15/2025  . Hepatitis C Screening  Completed  . PNA vac Low Risk Adult  Completed    Cancer Screenings:  Colorectal Screening: Completed 10/16/15. Repeat every 10 years.  Mammogram: Completed 10/11/18.   Bone Density: Completed 09/08/18. Results reflect OSTEOPOROSIS. Repeat every 2 years.   Lung Cancer Screening: (Low Dose CT Chest recommended if Age 95-80 years, 30 pack-year currently smoking OR have quit w/in 15years.) does not qualify.   Additional Screening:  Hepatitis C Screening: Up to date  Vision Screening: Recommended annual ophthalmology exams for early detection of glaucoma and other disorders of the eye.  Dental Screening: Recommended annual dental exams for proper oral hygiene  Community Resource Referral:  CRR required this visit?  No       Plan:  I have personally reviewed and addressed the Medicare Annual Wellness questionnaire and have noted the following in the patient's chart:  A. Medical and social history B. Use of alcohol, tobacco or illicit drugs  C. Current medications and supplements D. Functional ability and status E.  Nutritional status F.  Physical activity G. Advance directives H. List of other physicians I.  Hospitalizations, surgeries, and ER visits in previous 12 months J.  [place] such as hearing and vision if needed, cognitive and depression L. Referrals and appointments   In addition, I have reviewed and discussed with patient certain preventive protocols, quality metrics, and best practice recommendations. A written personalized care plan for preventive services as well as general preventive health recommendations were provided to patient. Nurse Health Advisor  Signed,    Abass Misener [place], [place]   02/04/864 Nurse Health Advisor   Nurse Notes: None.

## 2019-06-20 NOTE — Patient Instructions (Addendum)
Erin Romero , Thank you for taking time to come for your Medicare Wellness Visit. I appreciate your ongoing commitment to your health goals. Please review the following plan we discussed and let me know if I can assist you in the future.   Screening recommendations/referrals: Colonoscopy: Up to date, due 09/2025 Mammogram: Up to date, due 09/2021 Bone Density: Up to date, due 08/2020 Recommended yearly ophthalmology/optometry visit for glaucoma screening and checkup Recommended yearly dental visit for hygiene and checkup  Vaccinations: Influenza vaccine: Due fall 2020 Pneumococcal vaccine: Completed series Tdap vaccine: Pt declines today.  Shingles vaccine: Pt declines today.     Advanced directives: Currently on file.  Conditions/risks identified: Continue to cut back on sugars in daily diet and increase water intake to 6-8 8 oz glasses a day.   Next appointment: 08/09/19 @ 10 AM with Carles Collet.    Preventive Care 22 Years and Older, Female Preventive care refers to lifestyle choices and visits with your health care provider that can promote health and wellness. What does preventive care include?  A yearly physical exam. This is also called an annual well check.  Dental exams once or twice a year.  Routine eye exams. Ask your health care provider how often you should have your eyes checked.  Personal lifestyle choices, including:  Daily care of your teeth and gums.  Regular physical activity.  Eating a healthy diet.  Avoiding tobacco and drug use.  Limiting alcohol use.  Practicing safe sex.  Taking low-dose aspirin every day.  Taking vitamin and mineral supplements as recommended by your health care provider. What happens during an annual well check? The services and screenings done by your health care provider during your annual well check will depend on your age, overall health, lifestyle risk factors, and family history of disease. Counseling  Your health  care provider may ask you questions about your:  Alcohol use.  Tobacco use.  Drug use.  Emotional well-being.  Home and relationship well-being.  Sexual activity.  Eating habits.  History of falls.  Memory and ability to understand (cognition).  Work and work Statistician.  Reproductive health. Screening  You may have the following tests or measurements:  Height, weight, and BMI.  Blood pressure.  Lipid and cholesterol levels. These may be checked every 5 years, or more frequently if you are over 23 years old.  Skin check.  Lung cancer screening. You may have this screening every year starting at age 75 if you have a 30-pack-year history of smoking and currently smoke or have quit within the past 15 years.  Fecal occult blood test (FOBT) of the stool. You may have this test every year starting at age 61.  Flexible sigmoidoscopy or colonoscopy. You may have a sigmoidoscopy every 5 years or a colonoscopy every 10 years starting at age 2.  Hepatitis C blood test.  Hepatitis B blood test.  Sexually transmitted disease (STD) testing.  Diabetes screening. This is done by checking your blood sugar (glucose) after you have not eaten for a while (fasting). You may have this done every 1-3 years.  Bone density scan. This is done to screen for osteoporosis. You may have this done starting at age 67.  Mammogram. This may be done every 1-2 years. Talk to your health care provider about how often you should have regular mammograms. Talk with your health care provider about your test results, treatment options, and if necessary, the need for more tests. Vaccines  Your health care  provider may recommend certain vaccines, such as:  Influenza vaccine. This is recommended every year.  Tetanus, diphtheria, and acellular pertussis (Tdap, Td) vaccine. You may need a Td booster every 10 years.  Zoster vaccine. You may need this after age 27.  Pneumococcal 13-valent conjugate  (PCV13) vaccine. One dose is recommended after age 54.  Pneumococcal polysaccharide (PPSV23) vaccine. One dose is recommended after age 54. Talk to your health care provider about which screenings and vaccines you need and how often you need them. This information is not intended to replace advice given to you by your health care provider. Make sure you discuss any questions you have with your health care provider. Document Released: 01/04/2016 Document Revised: 08/27/2016 Document Reviewed: 10/09/2015 Elsevier Interactive Patient Education  2017 [place] Prevention in the Home Falls can cause injuries. They can happen to people of all ages. There are many things you can do to make your home safe and to help prevent falls. What can I do on the outside of my home?  Regularly fix the edges of walkways and driveways and fix any cracks.  Remove anything that might make you trip as you walk through a door, such as a raised step or threshold.  Trim any bushes or trees on the path to your home.  Use bright outdoor lighting.  Clear any walking paths of anything that might make someone trip, such as rocks or tools.  Regularly check to see if handrails are loose or broken. Make sure that both sides of any steps have handrails.  Any raised decks and porches should have guardrails on the edges.  Have any leaves, snow, or ice cleared regularly.  Use sand or salt on walking paths during winter.  Clean up any spills in your garage right away. This includes oil or grease spills. What can I do in the bathroom?  Use night lights.  Install grab bars by the toilet and in the tub and shower. Do not use towel bars as grab bars.  Use non-skid mats or decals in the tub or shower.  If you need to sit down in the shower, use a plastic, non-slip stool.  Keep the floor dry. Clean up any water that spills on the floor as soon as it happens.  Remove soap buildup in the tub or shower  regularly.  Attach bath mats securely with double-sided non-slip rug tape.  Do not have throw rugs and other things on the floor that can make you trip. What can I do in the bedroom?  Use night lights.  Make sure that you have a light by your bed that is easy to reach.  Do not use any sheets or blankets that are too big for your bed. They should not hang down onto the floor.  Have a firm chair that has side arms. You can use this for support while you get dressed.  Do not have throw rugs and other things on the floor that can make you trip. What can I do in the kitchen?  Clean up any spills right away.  Avoid walking on wet floors.  Keep items that you use a lot in easy-to-reach places.  If you need to reach something above you, use a strong step stool that has a grab bar.  Keep electrical cords out of the way.  Do not use floor polish or wax that makes floors slippery. If you must use wax, use non-skid floor wax.  Do not have throw  rugs and other things on the floor that can make you trip. What can I do with my stairs?  Do not leave any items on the stairs.  Make sure that there are handrails on both sides of the stairs and use them. Fix handrails that are broken or loose. Make sure that handrails are as long as the stairways.  Check any carpeting to make sure that it is firmly attached to the stairs. Fix any carpet that is loose or worn.  Avoid having throw rugs at the top or bottom of the stairs. If you do have throw rugs, attach them to the floor with carpet tape.  Make sure that you have a light switch at the top of the stairs and the bottom of the stairs. If you do not have them, ask someone to add them for you. What else can I do to help prevent falls?  Wear shoes that:  Do not have high heels.  Have rubber bottoms.  Are comfortable and fit you well.  Are closed at the toe. Do not wear sandals.  If you use a stepladder:  Make sure that it is fully  opened. Do not climb a closed stepladder.  Make sure that both sides of the stepladder are locked into place.  Ask someone to hold it for you, if possible.  Clearly mark and make sure that you can see:  Any grab bars or handrails.  First and last steps.  Where the edge of each step is.  Use tools that help you move around (mobility aids) if they are needed. These include:  Canes.  Walkers.  Scooters.  Crutches.  Turn on the lights when you go into a dark area. Replace any light bulbs as soon as they burn out.  Set up your furniture so you have a clear path. Avoid moving your furniture around.  If any of your floors are uneven, fix them.  If there are any pets around you, be aware of where they are.  Review your medicines with your doctor. Some medicines can make you feel dizzy. This can increase your chance of falling. Ask your doctor what other things that you can do to help prevent falls. This information is not intended to replace advice given to you by your health care provider. Make sure you discuss any questions you have with your health care provider. Document Released: 10/04/2009 Document Revised: 05/15/2016 Document Reviewed: 01/12/2015 Elsevier Interactive Patient Education  2017 [employer].

## 2019-06-21 DIAGNOSIS — I4891 Unspecified atrial fibrillation: Secondary | ICD-10-CM | POA: Diagnosis not present

## 2019-06-21 DIAGNOSIS — Z7901 Long term (current) use of anticoagulants: Secondary | ICD-10-CM | POA: Diagnosis not present

## 2019-06-23 ENCOUNTER — Other Ambulatory Visit: Payer: Self-pay | Admitting: Physician Assistant

## 2019-06-23 DIAGNOSIS — I10 Essential (primary) hypertension: Secondary | ICD-10-CM

## 2019-07-12 ENCOUNTER — Encounter: Payer: Self-pay | Admitting: General Surgery

## 2019-07-15 ENCOUNTER — Other Ambulatory Visit: Payer: Self-pay | Admitting: Physician Assistant

## 2019-07-15 DIAGNOSIS — E7800 Pure hypercholesterolemia, unspecified: Secondary | ICD-10-CM

## 2019-07-15 DIAGNOSIS — F419 Anxiety disorder, unspecified: Secondary | ICD-10-CM

## 2019-07-19 DIAGNOSIS — Z7901 Long term (current) use of anticoagulants: Secondary | ICD-10-CM | POA: Diagnosis not present

## 2019-07-19 DIAGNOSIS — I4891 Unspecified atrial fibrillation: Secondary | ICD-10-CM | POA: Diagnosis not present

## 2019-07-29 ENCOUNTER — Other Ambulatory Visit: Payer: Self-pay | Admitting: Physician Assistant

## 2019-07-29 DIAGNOSIS — G4700 Insomnia, unspecified: Secondary | ICD-10-CM

## 2019-08-09 ENCOUNTER — Other Ambulatory Visit: Payer: Self-pay

## 2019-08-09 ENCOUNTER — Ambulatory Visit (INDEPENDENT_AMBULATORY_CARE_PROVIDER_SITE_OTHER): Payer: Medicare HMO | Admitting: Physician Assistant

## 2019-08-09 ENCOUNTER — Encounter: Payer: Self-pay | Admitting: Physician Assistant

## 2019-08-09 VITALS — BP 110/70 | HR 76 | Temp 97.6°F | Resp 16 | Wt 237.0 lb

## 2019-08-09 DIAGNOSIS — E876 Hypokalemia: Secondary | ICD-10-CM | POA: Diagnosis not present

## 2019-08-09 DIAGNOSIS — Z170 Estrogen receptor positive status [ER+]: Secondary | ICD-10-CM

## 2019-08-09 DIAGNOSIS — Z0000 Encounter for general adult medical examination without abnormal findings: Secondary | ICD-10-CM

## 2019-08-09 DIAGNOSIS — F329 Major depressive disorder, single episode, unspecified: Secondary | ICD-10-CM

## 2019-08-09 DIAGNOSIS — F419 Anxiety disorder, unspecified: Secondary | ICD-10-CM | POA: Diagnosis not present

## 2019-08-09 DIAGNOSIS — F32A Depression, unspecified: Secondary | ICD-10-CM

## 2019-08-09 DIAGNOSIS — E7800 Pure hypercholesterolemia, unspecified: Secondary | ICD-10-CM

## 2019-08-09 DIAGNOSIS — I10 Essential (primary) hypertension: Secondary | ICD-10-CM

## 2019-08-09 DIAGNOSIS — C50111 Malignant neoplasm of central portion of right female breast: Secondary | ICD-10-CM

## 2019-08-09 DIAGNOSIS — R739 Hyperglycemia, unspecified: Secondary | ICD-10-CM | POA: Diagnosis not present

## 2019-08-09 NOTE — Patient Instructions (Signed)
Health Maintenance, Female Adopting a healthy lifestyle and getting preventive care are important in promoting health and wellness. Ask your health care provider about:  The right schedule for you to have regular tests and exams.  Things you can do on your own to prevent diseases and keep yourself healthy. What should I know about diet, weight, and exercise? Eat a healthy diet   Eat a diet that includes plenty of vegetables, fruits, low-fat dairy products, and lean protein.  Do not eat a lot of foods that are high in solid fats, added sugars, or sodium. Maintain a healthy weight Body mass index (BMI) is used to identify weight problems. It estimates body fat based on height and weight. Your health care provider can help determine your BMI and help you achieve or maintain a healthy weight. Get regular exercise Get regular exercise. This is one of the most important things you can do for your health. Most adults should:  Exercise for at least 150 minutes each week. The exercise should increase your heart rate and make you sweat (moderate-intensity exercise).  Do strengthening exercises at least twice a week. This is in addition to the moderate-intensity exercise.  Spend less time sitting. Even light physical activity can be beneficial. Watch cholesterol and blood lipids Have your blood tested for lipids and cholesterol at 69 years of age, then have this test every 5 years. Have your cholesterol levels checked more often if:  Your lipid or cholesterol levels are high.  You are older than 69 years of age.  You are at high risk for heart disease. What should I know about cancer screening? Depending on your health history and family history, you may need to have cancer screening at various ages. This may include screening for:  Breast cancer.  Cervical cancer.  Colorectal cancer.  Skin cancer.  Lung cancer. What should I know about heart disease, diabetes, and high blood  pressure? Blood pressure and heart disease  High blood pressure causes heart disease and increases the risk of stroke. This is more likely to develop in people who have high blood pressure readings, are of African descent, or are overweight.  Have your blood pressure checked: ? Every 3-5 years if you are 18-39 years of age. ? Every year if you are 40 years old or older. Diabetes Have regular diabetes screenings. This checks your fasting blood sugar level. Have the screening done:  Once every three years after age 40 if you are at a normal weight and have a low risk for diabetes.  More often and at a younger age if you are overweight or have a high risk for diabetes. What should I know about preventing infection? Hepatitis B If you have a higher risk for hepatitis B, you should be screened for this virus. Talk with your health care provider to find out if you are at risk for hepatitis B infection. Hepatitis C Testing is recommended for:  Everyone born from 1945 through 1965.  Anyone with known risk factors for hepatitis C. Sexually transmitted infections (STIs)  Get screened for STIs, including gonorrhea and chlamydia, if: ? You are sexually active and are younger than 69 years of age. ? You are older than 69 years of age and your health care provider tells you that you are at risk for this type of infection. ? Your sexual activity has changed since you were last screened, and you are at increased risk for chlamydia or gonorrhea. Ask your health care provider if   you are at risk.  Ask your health care provider about whether you are at high risk for HIV. Your health care provider may recommend a prescription medicine to help prevent HIV infection. If you choose to take medicine to prevent HIV, you should first get tested for HIV. You should then be tested every 3 months for as long as you are taking the medicine. Pregnancy  If you are about to stop having your period (premenopausal) and  you may become pregnant, seek counseling before you get pregnant.  Take 400 to 800 micrograms (mcg) of folic acid every day if you become pregnant.  Ask for birth control (contraception) if you want to prevent pregnancy. Osteoporosis and menopause Osteoporosis is a disease in which the bones lose minerals and strength with aging. This can result in bone fractures. If you are 65 years old or older, or if you are at risk for osteoporosis and fractures, ask your health care provider if you should:  Be screened for bone loss.  Take a calcium or vitamin D supplement to lower your risk of fractures.  Be given hormone replacement therapy (HRT) to treat symptoms of menopause. Follow these instructions at home: Lifestyle  Do not use any products that contain nicotine or tobacco, such as cigarettes, e-cigarettes, and chewing tobacco. If you need help quitting, ask your health care provider.  Do not use street drugs.  Do not share needles.  Ask your health care provider for help if you need support or information about quitting drugs. Alcohol use  Do not drink alcohol if: ? Your health care provider tells you not to drink. ? You are pregnant, may be pregnant, or are planning to become pregnant.  If you drink alcohol: ? Limit how much you use to 0-1 drink a day. ? Limit intake if you are breastfeeding.  Be aware of how much alcohol is in your drink. In the [place] one drink equals one 12 oz bottle of beer (355 mL), one 5 oz glass of wine (148 mL), or one 1 oz glass of hard liquor (44 mL). General instructions  Schedule regular health, dental, and eye exams.  Stay current with your vaccines.  Tell your health care provider if: ? You often feel depressed. ? You have ever been abused or do not feel safe at home. Summary  Adopting a healthy lifestyle and getting preventive care are important in promoting health and wellness.  Follow your health care provider's instructions about healthy  diet, exercising, and getting tested or screened for diseases.  Follow your health care provider's instructions on monitoring your cholesterol and blood pressure. This information is not intended to replace advice given to you by your health care provider. Make sure you discuss any questions you have with your health care provider. Document Released: 06/23/2011 Document Revised: 12/01/2018 Document Reviewed: 12/01/2018 Elsevier Patient Education  2020 Elsevier Inc.  

## 2019-08-09 NOTE — Progress Notes (Signed)
Patient: Erin Romero, Female    DOB: 01/16/1951, 68 y.o.   MRN: [id number] Visit Date: 08/09/2019  Today's Provider: Trinna Post, PA-C   Chief Complaint  Patient presents with  . Annual Exam   Subjective:   Patient had AWV with [facility] on 06/20/2019.   Complete Physical [name] is a 68 y.o. female. She feels poorly. She reports exercising none. She reports she is sleeping fairly well.  Wt Readings from Last 3 Encounters:  08/09/19 237 lb (107.5 kg)  02/08/19 238 lb (108 kg)  10/29/18 238 lb 2 oz (108 kg)   Mammogram: 10/11/2018 left normal, history of right breast cancer and right mastectomy. This is ordered through Dr. Bary Castilla. Give surgery clinic a call and see if another provider will assume the yearly check up. If not, I will order.  Colonoscopy: 10/16/2015 normal and repeat in 10 years  Atrial Fibrillation: This has proven more difficult recently to control, she sees Dr. Ubaldo Glassing for this at [facility]. Chronically anticoagulated with warfarin.   DEXA: 2019 with osteoporosis, patient has tried Fosamax and did not tolerate due to bone pain. She declined other treatments of this.   Insomnia: Continues trazodone 50 mg QHS.   HTN: Currently taking lisinopril 20 mg daily, hctz 12.5 mg daily, Ziac 10-6.25 mg daily, and amlodipine 2.5 mg daily.   HLD: She is currently lipitor 10 mg daily   Depression: Currently taking Lexapro 20 mg daily.   History of right sided breast cancer: Used to see Nolon Stalls, MD but provider has moved to [facility] and doesn't want to travel there. Is in the process of finding somebody else.  ----------------------------------------------   Review of Systems  HENT: Positive for tinnitus and trouble swallowing.   Eyes: Positive for visual disturbance.  Respiratory: Positive for shortness of breath.   Musculoskeletal: Positive for back pain.  Neurological: Positive for dizziness.  Hematological:  Bruises/bleeds easily.  All other systems reviewed and are negative.   Social History   Socioeconomic History  . Marital status: Widowed    Spouse name: Keila Turan  . Number of children: 0  . Years of education: 62  . Highest education level: 12th grade  Occupational History    Employer: UNEMPLOYED    Comment: disabilty from breast cancer  Social Needs  . Financial resource strain: Not hard at all  . Food insecurity    Worry: Never true    Inability: Never true  . Transportation needs    Medical: No    Non-medical: No  Tobacco Use  . Smoking status: Never Smoker  . Smokeless tobacco: Never Used  Substance and Sexual Activity  . Alcohol use: No  . Drug use: No  . Sexual activity: Not on file  Lifestyle  . Physical activity    Days per week: 0 days    Minutes per session: 0 min  . Stress: Not at all  Relationships  . Social Herbalist on phone: Patient refused    Gets together: Patient refused    Attends religious service: Patient refused    Active member of club or organization: Patient refused    Attends meetings of clubs or organizations: Patient refused    Relationship status: Patient refused  . Intimate partner violence    Fear of current or ex partner: Patient refused    Emotionally abused: Patient refused    Physically abused: Patient refused    Forced sexual activity:  Patient refused  Other Topics Concern  . Not on file  Social History Narrative  . Not on file    Past Medical History:  Diagnosis Date  . Anxiety   . Atrial fibrillation ([place]) 2014  . Breast cancer ([place]) 2014   right breast  . Fibrocystic breast disease 2005  . GERD (gastroesophageal reflux disease)   . Hypertension 2005  . Lump or mass in breast 2014   right mastectomy  . Malignant neoplasm of central portion of female breast ([place]) 01/24/2013   Stage II,T2, N1a. ER/PR 90%, HER-2/neu: Not overexpressed. Mastectomy with sentinel node biopsy. 2 sentinel nodes negative,  non-sentinel node w/ 5 mm macrometastatic foci in non-sentinel node.  Received Romero mastectomy radiation.      Patient Active Problem List   Diagnosis Date Noted  . Hypokalemia 02/04/2016  . Cystitis 07/26/2015  . Osteoporosis 06/05/2015  . Adaptation reaction 05/10/2015  . Anxiety 05/10/2015  . Arthritis 05/10/2015  . Esophageal tissue web 05/10/2015  . Genital herpes 05/10/2015  . Bloodgood disease 05/10/2015  . Blood glucose elevated 05/10/2015  . BP (high blood pressure) 05/10/2015  . Insomnia 05/10/2015  . Headache, migraine 05/10/2015  . Avitaminosis D 05/10/2015  . Rash and nonspecific skin eruption 04/04/2015  . Clinical depression 10/02/2014  . Hypercholesteremia 10/02/2014  . A-fib ([place]) 04/18/2014  . Breast mass, left 02/27/2014  . Breast cancer, right breast ([place]) 09/12/2013  . Lump or mass in breast   . Malignant neoplasm of central portion of female breast [facility])     Past Surgical History:  Procedure Laterality Date  . ABDOMINAL HYSTERECTOMY  1999  . BREAST BIOPSY Left 2015   BENIGN FIBROTIC BREAST TISSUE WITH CLUSTERED APOCRINE  . BREAST SURGERY Right 2005,2014   biopsy  . BREAST SURGERY Right 2014   mastectomy  . COLONOSCOPY  2002  . COLONOSCOPY WITH PROPOFOL N/A 10/16/2015   Procedure: COLONOSCOPY WITH PROPOFOL;  Surgeon: Robert Bellow, MD;  Location: [facility] ENDOSCOPY;  Service: Endoscopy;  Laterality: N/A;  . KNEE SURGERY Left 2005  . MASTECTOMY Right 2014   radiation chemo  . thumb surg Right 2004  . TONSILLECTOMY     age of 89    Her family history includes Cancer in her mother and another family member; Heart disease in her father; Hypertension in her sister; Osteoporosis in her paternal aunt and paternal grandmother.   Current Outpatient Medications:  .  acetaminophen (TYLENOL) 500 MG tablet, Take 500-1,000 mg by mouth every 4 (four) hours as needed for pain (q 4-6 prn)., Disp: , Rfl:  .  amLODipine (NORVASC) 2.5 MG tablet, TAKE 1 TABLET  EVERY DAY, Disp: 90 tablet, Rfl: 3 .  aspirin 81 MG chewable tablet, Chew 81 mg by mouth daily., Disp: , Rfl:  .  atorvastatin (LIPITOR) 10 MG tablet, TAKE 1 TABLET EVERY DAY, Disp: 90 tablet, Rfl: 1 .  bisoprolol-hydrochlorothiazide (ZIAC) 10-6.25 MG tablet, Take 1 tablet by mouth daily., Disp: 90 tablet, Rfl: 1 .  Calcium Carb-Cholecalciferol (OYSTER SHELL CALCIUM + D PO), Take 1 tablet by mouth daily. , Disp: , Rfl:  .  Cholecalciferol (VITAMIN D-3 PO), Take 1,000 Units by mouth daily. , Disp: , Rfl:  .  docusate sodium (COLACE) 100 MG capsule, Take 100 mg by mouth daily as needed. , Disp: , Rfl:  .  escitalopram (LEXAPRO) 10 MG tablet, TAKE 1 TABLET EVERY DAY, Disp: 90 tablet, Rfl: 1 .  hydrochlorothiazide (HYDRODIURIL) 12.5 MG tablet, TAKE 1 TABLET EVERY DAY,  Disp: 90 tablet, Rfl: 0 .  lisinopril (ZESTRIL) 20 MG tablet, TAKE 1 TABLET EVERY DAY, Disp: 90 tablet, Rfl: 1 .  omeprazole (PRILOSEC) 20 MG capsule, TAKE 1 CAPSULE EVERY DAY, Disp: 90 capsule, Rfl: 1 .  potassium chloride SA (K-DUR) 20 MEQ tablet, TAKE 1 TABLET EVERY DAY, Disp: 90 tablet, Rfl: 1 .  traZODone (DESYREL) 50 MG tablet, TAKE 1 TABLET EVERY DAY, Disp: 90 tablet, Rfl: 1 .  warfarin (COUMADIN) 1 MG tablet, Take 1.5 mg by mouth daily., Disp: , Rfl:  .  warfarin (COUMADIN) 1 MG tablet, Take by mouth., Disp: , Rfl:  .  warfarin (COUMADIN) 2 MG tablet, Take 1 mg by mouth daily. , Disp: , Rfl:   Patient Care Team: Paulene Floor as PCP - General (Physician Assistant) Bary Castilla Forest Gleason, MD as Consulting Physician (General Surgery) Teodoro Spray, MD as Consulting Physician (Cardiology) Lequita Asal, MD as Referring Physician (Hematology and Oncology)     Objective:    Vitals: BP 110/70 (BP Location: Left Arm, Patient Position: Sitting, Cuff Size: Large)   Pulse 76   Temp 97.6 F (36.4 C) (Temporal)   Resp 16   Wt 237 lb (107.5 kg)   SpO2 97%   BMI 40.68 kg/m   Physical Exam Constitutional:       Appearance: Normal appearance. She is obese.  Cardiovascular:     Rate and Rhythm: Normal rate and regular rhythm.     Heart sounds: Normal heart sounds.  Pulmonary:     Effort: Pulmonary effort is normal.     Breath sounds: Normal breath sounds.  Neurological:     Mental Status: She is alert and oriented to person, place, and time. Mental status is at baseline.  Psychiatric:        Mood and Affect: Mood normal.        Behavior: Behavior normal.     Activities of Daily Living In your present state of health, do you have any difficulty performing the following activities: 06/20/2019  Hearing? N  Vision? Y  Comment Due to cataracts forming on both eyes.  Difficulty concentrating or making decisions? N  Walking or climbing stairs? Y  Comment Due to pain in knees and SOB.  Dressing or bathing? N  Doing errands, shopping? N  Preparing Food and eating ? N  Using the Toilet? N  In the past six months, have you accidently leaked urine? N  Do you have problems with loss of bowel control? N  Managing your Medications? N  Managing your Finances? N  Housekeeping or managing your Housekeeping? N  Some recent data might be hidden    Fall Risk Assessment Fall Risk  06/20/2019 06/15/2018 06/25/2017 11/05/2016 08/22/2015  Falls in the past year? 0 No No No No     Depression Screen PHQ 2/9 Scores 06/20/2019 06/15/2018 06/25/2017 05/21/2017  PHQ - 2 Score 0 1 0 1  PHQ- 9 Score - - 1 5    6CIT Screen 06/20/2019  What Year? 0 points  What month? 0 points  What time? 0 points  Count back from 20 0 points  Months in reverse 0 points  Repeat phrase 0 points  Total Score 0       Assessment & Plan:    Annual Physical Reviewed patient's Family Medical History Reviewed and updated list of patient's medical providers Assessment of cognitive impairment was done Assessed patient's functional ability Established a written schedule for health screening services Health Risk  Assessent Completed  and Reviewed  Exercise Activities and Dietary recommendations Goals    . DIET - REDUCE SUGAR INTAKE     Recommend cutting out sugar and ice cream from daily diet and substituting for sugar free snacks.     . Exercise 150 minutes per week (moderate activity)    . Increase water intake     Recommend increasing water intake to 4 glasses of water a day.        Immunization History  Administered Date(s) Administered  . Influenza, High Dose Seasonal PF 09/14/2018  . Influenza,inj,Quad PF,6+ Mos 09/06/2015, 09/21/2017  . Pneumococcal Conjugate-13 08/22/2015  . Pneumococcal Polysaccharide-23 09/03/2016, 09/21/2017    Health Maintenance  Topic Date Due  . INFLUENZA VACCINE  07/23/2019  . TETANUS/TDAP  12/22/2026 (Originally 03/23/1970)  . DEXA SCAN  09/08/2020  . MAMMOGRAM  10/11/2020  . COLONOSCOPY  10/15/2025  . Hepatitis C Screening  Completed  . PNA vac Low Risk Adult  Completed     Discussed health benefits of physical activity, and encouraged her to engage in regular exercise appropriate for her age and condition.    1. Annual physical exam  Screenings are up to date. Vaccinations are up to date.   2. Malignant neoplasm of central portion of right breast in female, estrogen receptor positive ([place])  History of right mastectomy. Previously saw Dr. Bary Castilla for yearly check ups who has since left the practice. I will order her mammogram but have instructed her either to switch providers within surgery clinic or re-establish with cancer clinic to monitor this.   - MM Digital Diagnostic Unilat L; Future  3. Hypokalemia   4. Anxiety  Continue Lexapro.  5. Essential hypertension  Continue current medications.  - Comprehensive Metabolic Panel (CMET) - CBC with Differential  6. Hypercholesteremia  Continue Lipitor.   - Lipid Profile  7. Depression, unspecified depression type  Continue Lexapro.   The entirety of the information documented in the History of  Present Illness, Review of Systems and Physical Exam were personally obtained by me. Portions of this information were initially documented by April M. Sabra Heck, CMA and reviewed by me for thoroughness and accuracy.   F/u 6 months chronic  ------------------------------------------------------------------------------------------------------------    Erin Post, PA-C  [facility]

## 2019-08-10 LAB — CBC WITH DIFFERENTIAL/PLATELET
Basophils Absolute: 0.1 10*3/uL (ref 0.0–0.2)
Basos: 1 %
EOS (ABSOLUTE): 0.1 10*3/uL (ref 0.0–0.4)
Eos: 1 %
Hematocrit: 39.4 % (ref 34.0–46.6)
Hemoglobin: 12.6 g/dL (ref 11.1–15.9)
Immature Grans (Abs): 0 10*3/uL (ref 0.0–0.1)
Immature Granulocytes: 1 %
Lymphocytes Absolute: 1.8 10*3/uL (ref 0.7–3.1)
Lymphs: 21 %
MCH: 26.1 pg — ABNORMAL LOW (ref 26.6–33.0)
MCHC: 32 g/dL (ref 31.5–35.7)
MCV: 82 fL (ref 79–97)
Monocytes Absolute: 0.7 10*3/uL (ref 0.1–0.9)
Monocytes: 8 %
Neutrophils Absolute: 6.1 10*3/uL (ref 1.4–7.0)
Neutrophils: 68 %
Platelets: 229 10*3/uL (ref 150–450)
RBC: 4.82 x10E6/uL (ref 3.77–5.28)
RDW: 15.8 % — ABNORMAL HIGH (ref 11.7–15.4)
WBC: 8.8 10*3/uL (ref 3.4–10.8)

## 2019-08-10 LAB — LIPID PANEL
Chol/HDL Ratio: 2.6 ratio (ref 0.0–4.4)
Cholesterol, Total: 132 mg/dL (ref 100–199)
HDL: 50 mg/dL (ref >39)
LDL Calculated: 55 mg/dL (ref 0–99)
Triglycerides: 133 mg/dL (ref 0–149)
VLDL Cholesterol Cal: 27 mg/dL (ref 5–40)

## 2019-08-10 LAB — COMPREHENSIVE METABOLIC PANEL
ALT: 11 IU/L (ref 0–32)
AST: 14 IU/L (ref 0–40)
Albumin/Globulin Ratio: 1.4 (ref 1.2–2.2)
Albumin: 4.2 g/dL (ref 3.8–4.8)
Alkaline Phosphatase: 89 IU/L (ref 39–117)
BUN/Creatinine Ratio: 13 (ref 12–28)
BUN: 13 mg/dL (ref 8–27)
Bilirubin Total: 0.5 mg/dL (ref 0.0–1.2)
CO2: 24 mmol/L (ref 20–29)
Calcium: 9.1 mg/dL (ref 8.7–10.3)
Chloride: 100 mmol/L (ref 96–106)
Creatinine, Ser: 0.97 mg/dL (ref 0.57–1.00)
GFR calc Af Amer: 69 mL/min/{1.73_m2} (ref >59)
GFR calc non Af Amer: 60 mL/min/{1.73_m2} (ref >59)
Globulin, Total: 3.1 g/dL (ref 1.5–4.5)
Glucose: 118 mg/dL — ABNORMAL HIGH (ref 65–99)
Potassium: 4.1 mmol/L (ref 3.5–5.2)
Sodium: 140 mmol/L (ref 134–144)
Total Protein: 7.3 g/dL (ref 6.0–8.5)

## 2019-08-12 ENCOUNTER — Telehealth: Payer: Self-pay

## 2019-08-12 LAB — SPECIMEN STATUS REPORT

## 2019-08-12 LAB — HGB A1C W/O EAG: Hgb A1c MFr Bld: 5.3 % (ref 4.8–5.6)

## 2019-08-12 NOTE — Telephone Encounter (Signed)
-----   Message from Trinna Post, [place] sent at 08/12/2019  8:18 AM EDT ----- A1c does not show prediabetes.

## 2019-08-12 NOTE — Telephone Encounter (Signed)
Patient advised as directed below. 

## 2019-08-16 ENCOUNTER — Inpatient Hospital Stay (HOSPITAL_BASED_OUTPATIENT_CLINIC_OR_DEPARTMENT_OTHER): Payer: Medicare HMO | Admitting: Oncology

## 2019-08-16 ENCOUNTER — Inpatient Hospital Stay: Payer: Medicare HMO | Attending: Oncology

## 2019-08-16 ENCOUNTER — Other Ambulatory Visit: Payer: Self-pay

## 2019-08-16 ENCOUNTER — Encounter: Payer: Self-pay | Admitting: Oncology

## 2019-08-16 VITALS — BP 138/85 | HR 75 | Temp 99.4°F | Resp 18 | Wt 235.3 lb

## 2019-08-16 DIAGNOSIS — I10 Essential (primary) hypertension: Secondary | ICD-10-CM | POA: Diagnosis not present

## 2019-08-16 DIAGNOSIS — Z90710 Acquired absence of both cervix and uterus: Secondary | ICD-10-CM | POA: Insufficient documentation

## 2019-08-16 DIAGNOSIS — Z9011 Acquired absence of right breast and nipple: Secondary | ICD-10-CM | POA: Insufficient documentation

## 2019-08-16 DIAGNOSIS — M816 Localized osteoporosis [Lequesne]: Secondary | ICD-10-CM

## 2019-08-16 DIAGNOSIS — C50111 Malignant neoplasm of central portion of right female breast: Secondary | ICD-10-CM | POA: Insufficient documentation

## 2019-08-16 DIAGNOSIS — Z7982 Long term (current) use of aspirin: Secondary | ICD-10-CM | POA: Insufficient documentation

## 2019-08-16 DIAGNOSIS — Z170 Estrogen receptor positive status [ER+]: Secondary | ICD-10-CM | POA: Diagnosis not present

## 2019-08-16 DIAGNOSIS — F419 Anxiety disorder, unspecified: Secondary | ICD-10-CM | POA: Diagnosis not present

## 2019-08-16 DIAGNOSIS — Z7901 Long term (current) use of anticoagulants: Secondary | ICD-10-CM | POA: Diagnosis not present

## 2019-08-16 DIAGNOSIS — I4891 Unspecified atrial fibrillation: Secondary | ICD-10-CM | POA: Insufficient documentation

## 2019-08-16 DIAGNOSIS — Z79811 Long term (current) use of aromatase inhibitors: Secondary | ICD-10-CM | POA: Insufficient documentation

## 2019-08-16 DIAGNOSIS — Z1231 Encounter for screening mammogram for malignant neoplasm of breast: Secondary | ICD-10-CM

## 2019-08-16 DIAGNOSIS — Z79899 Other long term (current) drug therapy: Secondary | ICD-10-CM | POA: Diagnosis not present

## 2019-08-16 DIAGNOSIS — M810 Age-related osteoporosis without current pathological fracture: Secondary | ICD-10-CM | POA: Diagnosis not present

## 2019-08-16 LAB — CBC WITH DIFFERENTIAL/PLATELET
Abs Immature Granulocytes: 0.03 10*3/uL (ref 0.00–0.07)
Basophils Absolute: 0.1 10*3/uL (ref 0.0–0.1)
Basophils Relative: 1 %
Eosinophils Absolute: 0.1 10*3/uL (ref 0.0–0.5)
Eosinophils Relative: 2 %
HCT: 36.9 % (ref 36.0–46.0)
Hemoglobin: 12.1 g/dL (ref 12.0–15.0)
Immature Granulocytes: 0 %
Lymphocytes Relative: 21 %
Lymphs Abs: 1.8 10*3/uL (ref 0.7–4.0)
MCH: 26.9 pg (ref 26.0–34.0)
MCHC: 32.8 g/dL (ref 30.0–36.0)
MCV: 82.2 fL (ref 80.0–100.0)
Monocytes Absolute: 0.7 10*3/uL (ref 0.1–1.0)
Monocytes Relative: 8 %
Neutro Abs: 5.9 10*3/uL (ref 1.7–7.7)
Neutrophils Relative %: 68 %
Platelets: 217 10*3/uL (ref 150–400)
RBC: 4.49 MIL/uL (ref 3.87–5.11)
RDW: 15.7 % — ABNORMAL HIGH (ref 11.5–15.5)
WBC: 8.6 10*3/uL (ref 4.0–10.5)
nRBC: 0 % (ref 0.0–0.2)

## 2019-08-16 LAB — COMPREHENSIVE METABOLIC PANEL
ALT: 14 U/L (ref 0–44)
AST: 21 U/L (ref 15–41)
Albumin: 3.6 g/dL (ref 3.5–5.0)
Alkaline Phosphatase: 72 U/L (ref 38–126)
Anion gap: 9 (ref 5–15)
BUN: 11 mg/dL (ref 8–23)
CO2: 26 mmol/L (ref 22–32)
Calcium: 8.9 mg/dL (ref 8.9–10.3)
Chloride: 103 mmol/L (ref 98–111)
Creatinine, Ser: 0.88 mg/dL (ref 0.44–1.00)
GFR calc Af Amer: >60 mL/min (ref >60)
GFR calc non Af Amer: >60 mL/min (ref >60)
Glucose, Bld: 119 mg/dL — ABNORMAL HIGH (ref 70–99)
Potassium: 3.5 mmol/L (ref 3.5–5.1)
Sodium: 138 mmol/L (ref 135–145)
Total Bilirubin: 0.8 mg/dL (ref 0.3–1.2)
Total Protein: 7.4 g/dL (ref 6.5–8.1)

## 2019-08-16 NOTE — Progress Notes (Signed)
[facility] day:  08/16/19   Chief Complaint: Erin Romero is a 68 y.o. female with stage IIB right breast cancer who is here for follow up.   PERTINENT ONCOLOGY HISTORY Patient previously followed up with Dr. Mike Gip.  Switch care to me on 08/16/2019. Extensive medical records were reviewed. history of stage IIB (T2N1M0) right breast cancer status post radical mastectomy on 2/3/2 medical 014.  Pathology revealed a 3 cm grade 2 invasive mammary carcinoma. 0/2 sentinel lymph nodes were positive.  In non-sentinel lymph nodes were positive for 0.5 cm macro metastasis with extracapsular extension.  DCIS was present.  ER >90 percent, PR>90%, HER-2/neu negative. Patient underwent 3 cycles of adjuvant chemotherapy with Taxotere and Cytoxan (03/08/2013 - 04/20/2013).  Treatment was complicated with erythromelalgia and an urticarial reaction.  And she did not receive any additional chemotherapy. Adjuvant radiation finished in July 2014.  Patient was started on letrozole in August 2014 and she self discontinued in October 2019. BCI showed low likelihood of benefit from extended endocrine therapy.  13.6% risk of late recurrence years 5-10. Patient has been off Letrozole since 09/2018.   Mammogram on 02/06/2014 was suspicious for left breast mass.  Left breast biopsy on 02/27/2014 was negative for malignancy.  Left-sided mammogram on 10/05/2017 was negative.   Left breast screening mammogram on 10/11/2018 revealed no evidence of malignancy.  CA27.29 was 24.9 on 12/20/2014, 28.8 on 05/22/2015, 26.3 on 11/26/2015, 31.3 on 05/26/2016, 22.0 on 11/24/2016, 23.5 on 08/03/2017, 22.2 on 02/02/2018, and 19.7 on 10/29/2018.  Bone density study on 06/05/2015 revealed osteopenia with a T score of -1.9 in the left femoral neck.  Bone density on 09/08/2018 revealed osteoporosis with a T-score of -2.5 in the left femoral neck and -2.2 in AP spine L2-L4.  Patient is on  calcium and vitamin D.  She was on an oral bisphosphonate x 1 month.  Patient previosly declined Prolia.   INTERVAL HISTORY Erin Romero is a 68 y.o. female who has above history reviewed by me today presents for follow up visit for management of breast cancer Problems and complaints are listed below: # Chronic multiple arthritic pain, ongoing problem for her.  She is on tramadol 28m daily with some relief.   # Osteoporosis, with T score of -2.5 of left femoral neck and -2.2 AP spine L2-L4.  Previously declined Prolia.  She reports that she had tried some bone strengthen medication for 1 month last year, and did not tolerate.  She cannot remember the name of the medication.  Appetite is good. Weight is stable,  235 lb 4.8 oz (106.7 kg), which compared to her last visit to the clinic, represents a 3 pound decrease.      Past Medical History:  Diagnosis Date  . Anxiety   . Atrial fibrillation ([facility] 2014  . Breast cancer ([facility] 2014   right breast  . Fibrocystic breast disease 2005  . GERD (gastroesophageal reflux disease)   . Hypertension 2005  . Lump or mass in breast 2014   right mastectomy  . Malignant neoplasm of central portion of female breast ([facility] 01/24/2013   Stage II,T2, N1a. ER/PR 90%, HER-2/neu: Not overexpressed. Mastectomy with sentinel node biopsy. 2 sentinel nodes negative, non-sentinel node w/ 5 mm macrometastatic foci in non-sentinel node.  Received post mastectomy radiation.     Past Surgical History:  Procedure Laterality Date  . ABDOMINAL HYSTERECTOMY  1999  . BREAST BIOPSY Left 2015   BENIGN  FIBROTIC BREAST TISSUE WITH CLUSTERED APOCRINE  . BREAST SURGERY Right 2005,2014   biopsy  . BREAST SURGERY Right 2014   mastectomy  . COLONOSCOPY  2002  . COLONOSCOPY WITH PROPOFOL N/A 10/16/2015   Procedure: COLONOSCOPY WITH PROPOFOL;  Surgeon: Robert Bellow, MD;  Location: [facility] ENDOSCOPY;  Service: Endoscopy;  Laterality: N/A;  . KNEE SURGERY Left  2005  . MASTECTOMY Right 2014   radiation chemo  . thumb surg Right 2004  . TONSILLECTOMY     age of 23    Family History  Problem Relation Age of Onset  . Heart disease Father   . Hypertension Sister   . Cancer Mother        cervical  . Cancer Other        ovarian,liver,colon cancer-no family member listed  . Osteoporosis Paternal Aunt   . Osteoporosis Paternal Grandmother     Social History:  reports that she has never smoked. She has never used smokeless tobacco. She reports that she does not drink alcohol or use drugs.  Her husband, Ludwig Clarks, died May 22, 2015.  She takes care of her mother-in-law (she can not see and needs everything done for her).  She lives in [place].  She ha a fluffy miniature daschund.  The patient is alone today.  Allergies:  Allergies  Allergen Reactions  . Taxotere [Docetaxel] Itching and Rash  . Sulfa Antibiotics Hives    Current Medications: Current Outpatient Medications  Medication Sig Dispense Refill  . acetaminophen (TYLENOL) 500 MG tablet Take 500-1,000 mg by mouth every 4 (four) hours as needed for pain (q 4-6 prn).    Marland Kitchen amLODipine (NORVASC) 2.5 MG tablet TAKE 1 TABLET EVERY DAY 90 tablet 3  . aspirin 81 MG chewable tablet Chew 81 mg by mouth daily.    Marland Kitchen atorvastatin (LIPITOR) 10 MG tablet TAKE 1 TABLET EVERY DAY 90 tablet 1  . bisoprolol-hydrochlorothiazide (ZIAC) 10-6.25 MG tablet Take 1 tablet by mouth daily. 90 tablet 1  . Calcium Carb-Cholecalciferol (OYSTER SHELL CALCIUM + D PO) Take 1 tablet by mouth daily.     . Cholecalciferol (VITAMIN D-3 PO) Take 1,000 Units by mouth daily.     Marland Kitchen docusate sodium (COLACE) 100 MG capsule Take 100 mg by mouth daily as needed.     Marland Kitchen escitalopram (LEXAPRO) 10 MG tablet TAKE 1 TABLET EVERY DAY 90 tablet 1  . hydrochlorothiazide (HYDRODIURIL) 12.5 MG tablet TAKE 1 TABLET EVERY DAY 90 tablet 0  . lisinopril (ZESTRIL) 20 MG tablet TAKE 1 TABLET EVERY DAY 90 tablet 1  . omeprazole (PRILOSEC) 20 MG capsule  TAKE 1 CAPSULE EVERY DAY 90 capsule 1  . potassium chloride SA (K-DUR) 20 MEQ tablet TAKE 1 TABLET EVERY DAY 90 tablet 1  . traZODone (DESYREL) 50 MG tablet TAKE 1 TABLET EVERY DAY 90 tablet 1  . warfarin (COUMADIN) 1 MG tablet Take 1.5 mg by mouth daily.    Marland Kitchen warfarin (COUMADIN) 1 MG tablet Take by mouth.    . warfarin (COUMADIN) 2 MG tablet Take 1 mg by mouth daily.      No current facility-administered medications for this visit.    Review of Systems  Constitutional: Positive for fatigue. Negative for appetite change, chills and fever.  HENT:   Negative for hearing loss and voice change.   Eyes: Negative for eye problems.  Respiratory: Negative for chest tightness and cough.   Cardiovascular: Negative for chest pain.  Gastrointestinal: Negative for abdominal distention, abdominal pain and blood in  stool.  Endocrine: Negative for hot flashes.  Genitourinary: Negative for difficulty urinating and frequency.   Musculoskeletal: Positive for arthralgias.  Skin: Negative for itching and rash.  Neurological: Negative for extremity weakness.  Hematological: Negative for adenopathy.  Psychiatric/Behavioral: Negative for confusion.    Physical Exam: Blood pressure 138/85, pulse 75, temperature 99.4 F (37.4 C), resp. rate 18, weight 235 lb 4.8 oz (106.7 kg). Physical Exam  Constitutional: She is oriented to person, place, and time. No distress.  Morbid obese  HENT:  Head: Normocephalic and atraumatic.  Mouth/Throat: No oropharyngeal exudate.  Eyes: Pupils are equal, round, and reactive to light. EOM are normal. No scleral icterus.  Neck: Normal range of motion. Neck supple.  Cardiovascular: Normal rate and regular rhythm.  No murmur heard. Pulmonary/Chest: Effort normal. No respiratory distress.  Abdominal: Soft. She exhibits no distension. There is no abdominal tenderness.  Musculoskeletal: Normal range of motion.        General: No edema.  Neurological: She is alert and oriented  to person, place, and time. No cranial nerve deficit.  Skin: Skin is warm and dry. She is not diaphoretic. No erythema.  Psychiatric: Affect normal.  Breast exam is performed in seated and lying down position. Patient is status post right mastectomy there is no evidence of any chest wall recurrence. No evidence of bilateral axillary adenopathy    Appointment on 08/16/2019  Component Date Value Ref Range Status  . Sodium 08/16/2019 138  135 - 145 mmol/L Final  . Potassium 08/16/2019 3.5  3.5 - 5.1 mmol/L Final  . Chloride 08/16/2019 103  98 - 111 mmol/L Final  . CO2 08/16/2019 26  22 - 32 mmol/L Final  . Glucose, Bld 08/16/2019 119* 70 - 99 mg/dL Final  . BUN 08/16/2019 11  8 - 23 mg/dL Final  . Creatinine, Ser 08/16/2019 0.88  0.44 - 1.00 mg/dL Final  . Calcium 08/16/2019 8.9  8.9 - 10.3 mg/dL Final  . Total Protein 08/16/2019 7.4  6.5 - 8.1 g/dL Final  . Albumin 08/16/2019 3.6  3.5 - 5.0 g/dL Final  . AST 08/16/2019 21  15 - 41 U/L Final  . ALT 08/16/2019 14  0 - 44 U/L Final  . Alkaline Phosphatase 08/16/2019 72  38 - 126 U/L Final  . Total Bilirubin 08/16/2019 0.8  0.3 - 1.2 mg/dL Final  . GFR calc non Af Amer 08/16/2019 >60  >60 mL/min Final  . GFR calc Af Amer 08/16/2019 >60  >60 mL/min Final  . Anion gap 08/16/2019 9  5 - 15 Final   Performed at [facility], [address] [place], [place] [address]  . WBC 08/16/2019 8.6  4.0 - 10.5 K/uL Final  . RBC 08/16/2019 4.49  3.87 - 5.11 MIL/uL Final  . Hemoglobin 08/16/2019 12.1  12.0 - 15.0 g/dL Final  . HCT 08/16/2019 36.9  36.0 - 46.0 % Final  . MCV 08/16/2019 82.2  80.0 - 100.0 fL Final  . MCH 08/16/2019 26.9  26.0 - 34.0 pg Final  . MCHC 08/16/2019 32.8  30.0 - 36.0 g/dL Final  . RDW 08/16/2019 15.7* 11.5 - 15.5 % Final  . Platelets 08/16/2019 217  150 - 400 K/uL Final  . nRBC 08/16/2019 0.0  0.0 - 0.2 % Final  . Neutrophils Relative % 08/16/2019 68  % Final  . Neutro Abs 08/16/2019 5.9  1.7 - 7.7 K/uL Final  .  Lymphocytes Relative 08/16/2019 21  % Final  . Lymphs Abs 08/16/2019  1.8  0.7 - 4.0 K/uL Final  . Monocytes Relative 08/16/2019 8  % Final  . Monocytes Absolute 08/16/2019 0.7  0.1 - 1.0 K/uL Final  . Eosinophils Relative 08/16/2019 2  % Final  . Eosinophils Absolute 08/16/2019 0.1  0.0 - 0.5 K/uL Final  . Basophils Relative 08/16/2019 1  % Final  . Basophils Absolute 08/16/2019 0.1  0.0 - 0.1 K/uL Final  . Immature Granulocytes 08/16/2019 0  % Final  . Abs Immature Granulocytes 08/16/2019 0.03  0.00 - 0.07 K/uL Final   Performed at [facility], [address] [place], [place] [address]    Assessment:  Riyana Biel is a 68 y.o. female follows up for breast cancer. 1. Malignant neoplasm of central portion of right breast in female, estrogen receptor positive ([place])   2. Localized osteoporosis without current pathological fracture    #History of stage IIB (T2N1M0) right breast cancer, ER PR positive HER-2 negative Patient finished approximately 5 years of letrozole and stopped in 09/2018. BCI results was discussed with patient.  Although she carries a risk of 13.5% late recurrence rate, she has low likelihood of benefit from extended endocrine therapy. Clinically doing well.  Denies any breast concerns.  Examination is stable. She is due for annual screening mammogram of left breast.  Will obtain.  #Osteoporosis, bone density showed osteoporosis.  Discussed with patient about continue calcium and vitamin D. Patient previously declined bisphosphonate or Prolia.  Encourage patient to reconsider and update me if she changes her mind.  RTC in 6 months for MD assessment and labs (CBC with diff, CMP, CA27.29).  We spent sufficient time to discuss many aspect of care, questions were answered to patient's satisfaction. Total face to face encounter time for this patient visit was 25 min. >50% of the time was  spent in counseling and coordination of care.    Earlie Server, MD  08/16/2019  , 8:19 PM

## 2019-08-16 NOTE — Progress Notes (Signed)
Patient is transitioning her care to Dr. Tasia Catchings.  Reports chronic arthritis that is bothering her today.

## 2019-08-17 LAB — CANCER ANTIGEN 27.29: CA 27.29: 28.1 U/mL (ref 0.0–38.6)

## 2019-08-31 ENCOUNTER — Other Ambulatory Visit: Payer: Self-pay | Admitting: Physician Assistant

## 2019-08-31 DIAGNOSIS — I10 Essential (primary) hypertension: Secondary | ICD-10-CM

## 2019-09-19 DIAGNOSIS — Z7901 Long term (current) use of anticoagulants: Secondary | ICD-10-CM | POA: Diagnosis not present

## 2019-09-19 DIAGNOSIS — I4891 Unspecified atrial fibrillation: Secondary | ICD-10-CM | POA: Diagnosis not present

## 2019-09-21 ENCOUNTER — Other Ambulatory Visit: Payer: Self-pay | Admitting: Physician Assistant

## 2019-09-21 DIAGNOSIS — I10 Essential (primary) hypertension: Secondary | ICD-10-CM

## 2019-09-29 DIAGNOSIS — I4891 Unspecified atrial fibrillation: Secondary | ICD-10-CM | POA: Diagnosis not present

## 2019-09-29 DIAGNOSIS — Z6841 Body Mass Index (BMI) 40.0 and over, adult: Secondary | ICD-10-CM | POA: Diagnosis not present

## 2019-09-29 DIAGNOSIS — E7800 Pure hypercholesterolemia, unspecified: Secondary | ICD-10-CM | POA: Diagnosis not present

## 2019-09-29 DIAGNOSIS — I10 Essential (primary) hypertension: Secondary | ICD-10-CM | POA: Diagnosis not present

## 2019-10-05 ENCOUNTER — Other Ambulatory Visit: Payer: Self-pay | Admitting: Physician Assistant

## 2019-10-13 ENCOUNTER — Ambulatory Visit
Admission: RE | Admit: 2019-10-13 | Discharge: 2019-10-13 | Disposition: A | Discharge status: Home / Self Care | Payer: Medicare HMO | Source: Ambulatory Visit | Attending: Oncology | Admitting: Oncology

## 2019-10-13 DIAGNOSIS — Z853 Personal history of malignant neoplasm of breast: Secondary | ICD-10-CM | POA: Diagnosis not present

## 2019-10-13 DIAGNOSIS — C50111 Malignant neoplasm of central portion of right female breast: Secondary | ICD-10-CM | POA: Diagnosis not present

## 2019-10-13 DIAGNOSIS — Z1231 Encounter for screening mammogram for malignant neoplasm of breast: Secondary | ICD-10-CM | POA: Diagnosis not present

## 2019-10-13 DIAGNOSIS — Z170 Estrogen receptor positive status [ER+]: Secondary | ICD-10-CM | POA: Insufficient documentation

## 2019-10-14 ENCOUNTER — Other Ambulatory Visit: Payer: Self-pay | Admitting: Oncology

## 2019-10-14 ENCOUNTER — Other Ambulatory Visit: Payer: Self-pay | Admitting: Physician Assistant

## 2019-10-14 DIAGNOSIS — R921 Mammographic calcification found on diagnostic imaging of breast: Secondary | ICD-10-CM

## 2019-10-14 DIAGNOSIS — R928 Other abnormal and inconclusive findings on diagnostic imaging of breast: Secondary | ICD-10-CM

## 2019-10-15 NOTE — Progress Notes (Signed)
Erin Romero,  Please inform patient that left breast calcification needs further evaluation. Please arrange patient to obtain left breast diagnostic mammogram. Thank you.

## 2019-10-17 ENCOUNTER — Telehealth: Payer: Self-pay

## 2019-10-17 NOTE — Telephone Encounter (Signed)
-----   Message from Jules Husbands, MD sent at 10/17/2019 10:27 AM EDT ----- Please contact Erin Romero and make sure pt will have further images for abn calcs ----- Message ----- From: Interface, Rad Results In Sent: 10/13/2019   4:03 PM EDT To: Jules Husbands, MD

## 2019-10-17 NOTE — Telephone Encounter (Signed)
Spoke with patient to notify her that [facility] would contact her to get her scheduled for additional imaging. Once she is scheduled for the appointment. Patient will contact our office to come in to discuss results with Dr.Pabon. Patient verbalizes understanding.

## 2019-10-17 NOTE — Telephone Encounter (Signed)
[place] spoke with Roselyn Reef and she notified me that [facility] will contact patient to get her scheduled for additional imaging for abnormal calcifications.

## 2019-10-17 NOTE — Telephone Encounter (Signed)
L.O.V. was on 08/09/2019 and no upcoming appointment.

## 2019-10-19 ENCOUNTER — Ambulatory Visit: Payer: Medicare HMO | Admitting: Surgery

## 2019-10-24 DIAGNOSIS — Z7901 Long term (current) use of anticoagulants: Secondary | ICD-10-CM | POA: Diagnosis not present

## 2019-10-24 DIAGNOSIS — I4891 Unspecified atrial fibrillation: Secondary | ICD-10-CM | POA: Diagnosis not present

## 2019-10-26 ENCOUNTER — Ambulatory Visit
Admission: RE | Admit: 2019-10-26 | Discharge: 2019-10-26 | Disposition: A | Discharge status: Home / Self Care | Payer: Medicare HMO | Source: Ambulatory Visit | Attending: Oncology | Admitting: Oncology

## 2019-10-26 ENCOUNTER — Other Ambulatory Visit: Payer: Self-pay | Admitting: Oncology

## 2019-10-26 DIAGNOSIS — R921 Mammographic calcification found on diagnostic imaging of breast: Secondary | ICD-10-CM | POA: Diagnosis not present

## 2019-10-26 DIAGNOSIS — R928 Other abnormal and inconclusive findings on diagnostic imaging of breast: Secondary | ICD-10-CM

## 2019-10-28 ENCOUNTER — Ambulatory Visit: Payer: Medicare HMO

## 2019-11-02 ENCOUNTER — Ambulatory Visit
Admission: RE | Admit: 2019-11-02 | Discharge: 2019-11-02 | Disposition: A | Discharge status: Home / Self Care | Payer: Medicare HMO | Source: Ambulatory Visit | Attending: Oncology | Admitting: Oncology

## 2019-11-02 DIAGNOSIS — R928 Other abnormal and inconclusive findings on diagnostic imaging of breast: Secondary | ICD-10-CM | POA: Insufficient documentation

## 2019-11-02 DIAGNOSIS — R921 Mammographic calcification found on diagnostic imaging of breast: Secondary | ICD-10-CM

## 2019-11-02 DIAGNOSIS — Z853 Personal history of malignant neoplasm of breast: Secondary | ICD-10-CM | POA: Diagnosis not present

## 2019-11-02 HISTORY — PX: BREAST BIOPSY: SHX20

## 2019-11-03 ENCOUNTER — Other Ambulatory Visit: Payer: Self-pay | Admitting: Oncology

## 2019-11-03 LAB — SURGICAL PATHOLOGY

## 2019-11-07 ENCOUNTER — Other Ambulatory Visit: Payer: Self-pay

## 2019-11-07 ENCOUNTER — Encounter: Payer: Self-pay | Admitting: Oncology

## 2019-11-07 ENCOUNTER — Encounter: Payer: Self-pay | Admitting: *Deleted

## 2019-11-07 ENCOUNTER — Inpatient Hospital Stay: Payer: Medicare HMO | Attending: Oncology | Admitting: Oncology

## 2019-11-07 VITALS — BP 138/84 | HR 75 | Temp 98.0°F | Resp 18 | Wt 235.6 lb

## 2019-11-07 DIAGNOSIS — D0512 Intraductal carcinoma in situ of left breast: Secondary | ICD-10-CM | POA: Diagnosis not present

## 2019-11-07 DIAGNOSIS — Z9011 Acquired absence of right breast and nipple: Secondary | ICD-10-CM | POA: Diagnosis not present

## 2019-11-07 DIAGNOSIS — I4891 Unspecified atrial fibrillation: Secondary | ICD-10-CM | POA: Diagnosis not present

## 2019-11-07 DIAGNOSIS — H40013 Open angle with borderline findings, low risk, bilateral: Secondary | ICD-10-CM | POA: Diagnosis not present

## 2019-11-07 DIAGNOSIS — Z9223 Personal history of estrogen therapy: Secondary | ICD-10-CM | POA: Diagnosis not present

## 2019-11-07 DIAGNOSIS — M816 Localized osteoporosis [Lequesne]: Secondary | ICD-10-CM | POA: Diagnosis not present

## 2019-11-07 DIAGNOSIS — Z853 Personal history of malignant neoplasm of breast: Secondary | ICD-10-CM | POA: Diagnosis not present

## 2019-11-07 DIAGNOSIS — Z170 Estrogen receptor positive status [ER+]: Secondary | ICD-10-CM | POA: Diagnosis not present

## 2019-11-07 DIAGNOSIS — C50111 Malignant neoplasm of central portion of right female breast: Secondary | ICD-10-CM | POA: Diagnosis not present

## 2019-11-07 DIAGNOSIS — Z809 Family history of malignant neoplasm, unspecified: Secondary | ICD-10-CM

## 2019-11-07 DIAGNOSIS — Z6841 Body Mass Index (BMI) 40.0 and over, adult: Secondary | ICD-10-CM | POA: Diagnosis not present

## 2019-11-07 NOTE — Progress Notes (Signed)
Met patient today during her follow up with Dr. Tasia Catchings.  She has been diagnosed with her second breast cancer.  This time in the left breast with DCIS.  She is scheduled to see Dr. Bary Castilla tomorrow at 4:30.  She was informed that due to insurance credentialing he could not do surgery until after 11/22/19.  She and Dr. Tasia Catchings agree surgery can wait until then.  She is to let me know her surgery date so I can schedule her post surgery follow up with Dr. Tasia Catchings.  She is agreeable.

## 2019-11-07 NOTE — Progress Notes (Signed)
Patient here for follow up. No concerns voiced.  °

## 2019-11-07 NOTE — Progress Notes (Signed)
[facility] day:  11/07/19   Chief Complaint: Erin Romero is a 68 y.o. female with stage IIB right breast cancer who is here for follow up.   PERTINENT ONCOLOGY HISTORY Patient previously followed up with Dr. Mike Romero.  Switch care to me on 08/16/2019. Extensive medical records were reviewed. history of stage IIB (T2N1M0) right breast cancer status post radical mastectomy on 2/3/2 medical 014.  Pathology revealed a 3 cm grade 2 invasive mammary carcinoma. 0/2 sentinel lymph nodes were positive.  In non-sentinel lymph nodes were positive for 0.5 cm macro metastasis with extracapsular extension.  DCIS was present.  ER >90 percent, PR>90%, HER-2/neu negative. Patient underwent 3 cycles of adjuvant chemotherapy with Taxotere and Cytoxan (03/08/2013 - 04/20/2013).  Treatment was complicated with erythromelalgia and an urticarial reaction.  And she did not receive any additional chemotherapy. Adjuvant radiation finished in July 2014.  Patient was started on letrozole in August 2014 and she self discontinued in October 2019. BCI showed low likelihood of benefit from extended endocrine therapy.  13.6% risk of late recurrence years 5-10. Patient has been off Letrozole since 09/2018.   Mammogram on 02/06/2014 was suspicious for left breast mass.  Left breast biopsy on 02/27/2014 was negative for malignancy.  Left-sided mammogram on 10/05/2017 was negative.   Left breast screening mammogram on 10/11/2018 revealed no evidence of malignancy.  CA27.29 was 24.9 on 12/20/2014, 28.8 on 05/22/2015, 26.3 on 11/26/2015, 31.3 on 05/26/2016, 22.0 on 11/24/2016, 23.5 on 08/03/2017, 22.2 on 02/02/2018, and 19.7 on 10/29/2018.  #  Osteoporosis,  Bone density study on 06/05/2015 revealed osteopenia with a T score of -1.9 in the left femoral neck.  Bone density on 09/08/2018 revealed osteoporosis with a T-score of -2.5 in the left femoral neck and -2.2 in AP spine  L2-L4.  Patient is on calcium and vitamin D.  She was on an oral bisphosphonate x 1 month.  Patient previosly declined Prolia. with T score of -2.5 of left femoral neck and -2.2 AP spine L2-L4.  Previously declined Prolia.  She reports that she had tried some bone strengthen medication for 1 month last year, and did not tolerate.  She cannot remember the name of the medication.   INTERVAL HISTORY Erin Romero is a 68 y.o. female who has above history reviewed by me today presents for follow up visit for management of breast cancer Problems and complaints are listed below: #  10/13/2019 left screening mammogram showed calcifications warrants further evaluation. 10/26/2019 patient underwent diagnostic mammogram which showed suspicious calcification in the lower inner quadrant of left breast.  11/02/2019 underwent stereotactic biopsy which showed DCIS, high-grade, with comedonecrosis and associated calcifications.  Patient reports feeling okay today.  No breast pain. Has some bruising around the area of biopsy. Patient takes Coumadin for atrial fibrillation.    Past Medical History:  Diagnosis Date  . Anxiety   . Atrial fibrillation (Erin Romero) 2014  . Breast cancer ([place]) 2014   right breast  . Fibrocystic breast disease 2005  . GERD (gastroesophageal reflux disease)   . Hypertension 2005  . Lump or mass in breast 2014   right mastectomy  . Malignant neoplasm of central portion of female breast ([place]) 01/24/2013   Stage II,T2, N1a. ER/PR 90%, HER-2/neu: Not overexpressed. Mastectomy with sentinel node biopsy. 2 sentinel nodes negative, non-sentinel node w/ 5 mm macrometastatic foci in non-sentinel node.  Received post mastectomy radiation.     Past Surgical History:  Procedure  Laterality Date  . ABDOMINAL HYSTERECTOMY  1999  . BREAST BIOPSY Left 2015   BENIGN FIBROTIC BREAST TISSUE WITH CLUSTERED APOCRINE  . BREAST BIOPSY Left 11/02/2019   affirm bx, x marker, path pending  .  BREAST SURGERY Right 2005,2014   biopsy  . BREAST SURGERY Right 2014   mastectomy  . COLONOSCOPY  2002  . COLONOSCOPY WITH PROPOFOL N/A 10/16/2015   Procedure: COLONOSCOPY WITH PROPOFOL;  Surgeon: Erin Bellow, MD;  Location: [facility] ENDOSCOPY;  Service: Endoscopy;  Laterality: N/A;  . KNEE SURGERY Left 2005  . MASTECTOMY Right 2014   radiation chemo  . thumb surg Right 2004  . TONSILLECTOMY     age of 42    Family History  Problem Relation Age of Onset  . Heart disease Father   . Hypertension Sister   . Cancer Mother        cervical  . Cancer Other        ovarian,liver,colon cancer-no family member listed  . Osteoporosis Paternal Aunt   . Osteoporosis Paternal Grandmother     Social History:  reports that she has never smoked. She has never used smokeless tobacco. She reports that she does not drink alcohol or use drugs.  Her husband, Erin Romero, died 05/25/2015.  She takes care of her mother-in-law (she can not see and needs everything done for her).  She lives in [place].  She ha a fluffy miniature daschund.  The patient is alone today.  Allergies:  Allergies  Allergen Reactions  . Taxotere [Docetaxel] Itching and Rash  . Sulfa Antibiotics Hives    Current Medications: Current Outpatient Medications  Medication Sig Dispense Refill  . acetaminophen (TYLENOL) 500 MG tablet Take 500-1,000 mg by mouth every 4 (four) hours as needed for pain (q 4-6 prn).    Marland Kitchen amLODipine (NORVASC) 2.5 MG tablet TAKE 1 TABLET EVERY DAY 90 tablet 3  . aspirin 81 MG chewable tablet Chew 81 mg by mouth daily.    Marland Kitchen atorvastatin (LIPITOR) 10 MG tablet TAKE 1 TABLET EVERY DAY 90 tablet 1  . bisoprolol-hydrochlorothiazide (ZIAC) 10-6.25 MG tablet Take 1 tablet by mouth daily. 90 tablet 1  . Calcium Carb-Cholecalciferol (OYSTER SHELL CALCIUM + D PO) Take 1 tablet by mouth daily.     . Cholecalciferol (VITAMIN D-3 PO) Take 1,000 Units by mouth daily.     Marland Kitchen docusate sodium (COLACE) 100 MG capsule Take  100 mg by mouth daily as needed.     Marland Kitchen escitalopram (LEXAPRO) 10 MG tablet TAKE 1 TABLET EVERY DAY 90 tablet 1  . hydrochlorothiazide (HYDRODIURIL) 12.5 MG tablet TAKE 1 TABLET EVERY DAY 90 tablet 0  . lisinopril (ZESTRIL) 20 MG tablet TAKE 1 TABLET EVERY DAY 90 tablet 1  . omeprazole (PRILOSEC) 20 MG capsule TAKE 1 CAPSULE EVERY DAY 90 capsule 1  . potassium chloride SA (KLOR-CON) 20 MEQ tablet TAKE 1 TABLET EVERY DAY 90 tablet 1  . traZODone (DESYREL) 50 MG tablet TAKE 1 TABLET EVERY DAY 90 tablet 1  . warfarin (COUMADIN) 1 MG tablet Take 1.5 mg by mouth daily.    Marland Kitchen warfarin (COUMADIN) 1 MG tablet Take by mouth.    . warfarin (COUMADIN) 2 MG tablet Take 1 mg by mouth daily.      No current facility-administered medications for this visit.    Review of Systems  Constitutional: Positive for fatigue. Negative for appetite change, chills and fever.  HENT:   Negative for hearing loss and voice change.  Eyes: Negative for eye problems.  Respiratory: Negative for chest tightness and cough.   Cardiovascular: Negative for chest pain.  Gastrointestinal: Negative for abdominal distention, abdominal pain and blood in stool.  Endocrine: Negative for hot flashes.  Genitourinary: Negative for difficulty urinating and frequency.   Musculoskeletal: Positive for arthralgias.  Skin: Negative for itching and rash.  Neurological: Negative for extremity weakness.  Hematological: Negative for adenopathy.  Psychiatric/Behavioral: Negative for confusion.    Physical Exam: Blood pressure 138/84, pulse 75, temperature 98 F (36.7 C), resp. rate 18, weight 235 lb 9.6 oz (106.9 kg). Physical Exam  Constitutional: She is oriented to person, place, and time. No distress.  Morbid obese  HENT:  Head: Normocephalic and atraumatic.  Mouth/Throat: No oropharyngeal exudate.  Eyes: Pupils are equal, round, and reactive to light. EOM are normal. No scleral icterus.  Neck: Normal range of motion. Neck supple.   Cardiovascular: Normal rate and regular rhythm.  No murmur heard. Pulmonary/Chest: Effort normal. No respiratory distress.  Abdominal: Soft. She exhibits no distension. There is no abdominal tenderness.  Musculoskeletal: Normal range of motion.        General: No edema.  Neurological: She is alert and oriented to person, place, and time. No cranial nerve deficit.  Skin: Skin is warm and dry. She is not diaphoretic. No erythema.  Psychiatric: Affect normal.  Breast exam is performed in seated and lying down position. Patient is status post right mastectomy there is no evidence of any chest wall recurrence.Left breast bruising at the site of biopsy.  No palpable mass. No evidence of bilateral axillary adenopathy    RADIOGRAPHIC STUDIES: I have personally reviewed the radiological images as listed and agreed with the findings in the report. Mm Digital Diagnostic Unilat L  Result Date: 10/26/2019 CLINICAL DATA:  Patient was called back from screening mammogram for left breast calcifications. History of right breast cancer status post mastectomy. EXAM: DIGITAL DIAGNOSTIC UNILATERAL LEFT MAMMOGRAM WITH CAD AND TOMO COMPARISON:  Previous exam(s). ACR Breast Density Category b: There are scattered areas of fibroglandular density. FINDINGS: Additional imaging of the left breast was performed. There are developing coarse heterogeneous calcifications in the lower-inner quadrant of the left breast spanning 1.6 cm. There is no associated mass. Mammographic images were processed with CAD. IMPRESSION: Suspicious calcifications in the lower-inner quadrant of the left breast. RECOMMENDATION: Stereotactic biopsy of the calcifications in the lower-inner quadrant of the left breast is recommended. I have discussed the findings and recommendations with the patient. If applicable, a reminder letter will be sent to the patient regarding the next appointment. BI-RADS CATEGORY  4: Suspicious. Electronically Signed   By:  Lillia Mountain M.D.   On: 10/26/2019 11:44   Mm 3d Screen Breast Uni Left  Result Date: 10/13/2019 CLINICAL DATA:  Screening. History of RIGHT breast cancer and mastectomy. EXAM: DIGITAL SCREENING UNILATERAL LEFT MAMMOGRAM WITH CAD AND TOMO COMPARISON:  Previous exam(s). ACR Breast Density Category b: There are scattered areas of fibroglandular density. FINDINGS: In the left breast, calcifications warrant further evaluation. Images were processed with CAD. IMPRESSION: Further evaluation is suggested for calcifications in the left breast. RECOMMENDATION: Diagnostic mammogram of the left breast. (Code:[id number]-L-44M) The patient will be contacted regarding the findings, and additional imaging will be scheduled. BI-RADS CATEGORY  0: Incomplete. Need additional imaging evaluation and/or prior mammograms for comparison. Electronically Signed   By: Margarette Canada M.D.   On: 10/13/2019 16:01   Mm Clip Placement Left  Result Date: 11/02/2019 CLINICAL DATA:  Patient presents for biopsy of left breast calcifications. EXAM: DIAGNOSTIC LEFT MAMMOGRAM POST STEREOTACTIC BIOPSY COMPARISON:  Previous exam(s). FINDINGS: Mammographic images were obtained following stereotactic guided biopsy of calcifications in the lower inner left breast. The biopsy marking clip is in expected position at the site of biopsy. IMPRESSION: Appropriate positioning of the X shaped biopsy marking clip at the site of biopsy in the lower inner left breast. Final Assessment: Post Procedure Mammograms for Marker Placement Electronically Signed   By: Audie Pinto M.D.   On: 11/02/2019 11:52   Mm Lt Breast Bx W Loc Dev 1st Lesion Image Bx Spec Stereo Guide  Addendum Date: 11/07/2019   ADDENDUM REPORT: 11/04/2019 14:42 ADDENDUM: PATHOLOGY revealed: A. LEFT BREAST, CALCIFICATIONS, STEREOTACTIC NEEDLE CORE BIOPSY: - DUCTAL CARCINOMA IN SITU, HIGH GRADE, WITH COMEDONECROSIS AND ASSOCIATED CALCIFICATIONS. Comment: DCIS involves 7 of 7 tissue blocks, with a  greatest linear extent of 12 mm. ER and PR are deferred to an excision specimen, but stains can be performed on block A2 if necessary. Pathology results are CONCORDANT with imaging findings, per Dr. Audie Pinto. Pathology results were discussed with patient via telephone. The patient reported doing well after the biopsy with no adverse symptoms, only tenderness at the site. Post biopsy care instructions were reviewed and questions were answered. The patient was encouraged to call [facility] for any additional questions or concerns. Recommendation: Surgical referral. Request for surgical referral was relayed to nurse navigators at [facility] by Electa Sniff RN on 11/04/2019. Addendum by Electa Sniff RN on 11/04/2019. Electronically Signed   By: Audie Pinto M.D.   On: 11/04/2019 14:42   Result Date: 11/07/2019 CLINICAL DATA:  Patient presents for biopsy of calcifications in the lower inner left breast EXAM: LEFT BREAST STEREOTACTIC CORE NEEDLE BIOPSY COMPARISON:  Previous exams. FINDINGS: The patient and I discussed the procedure of stereotactic-guided biopsy including benefits and alternatives. We discussed the high likelihood of a successful procedure. We discussed the risks of the procedure including infection, bleeding, tissue injury, clip migration, and inadequate sampling. Informed written consent was given. The usual time out protocol was performed immediately prior to the procedure. Using sterile technique and 1% Lidocaine as local anesthetic, under stereotactic guidance, a 9 gauge vacuum assisted device was used to perform core needle biopsy of calcifications in the lower inner left breast using a medial approach. Specimen radiograph was performed showing 2 of 12 specimens with calcifications. Specimens with calcifications are identified for pathology. Lesion quadrant: Lower inner quadrant At the conclusion of the procedure, an X tissue marker clip was deployed into  the biopsy cavity. Follow-up 2-view mammogram was performed and dictated separately. IMPRESSION: Stereotactic-guided biopsy of left breast calcifications. No apparent complications. Electronically Signed: By: Audie Pinto M.D. On: 11/02/2019 11:53     Assessment:  Erin Romero is a 68 y.o. female follows up for breast cancer. 1. Ductal carcinoma in situ (DCIS) of left breast   2. Malignant neoplasm of central portion of right breast in female, estrogen receptor positive ([place])   3. Localized osteoporosis without current pathological fracture   4. Family history of cancer    #History of stage IIB (T2N1M0) right breast cancer, ER PR positive HER-2 negative Patient finished approximately 5 years of letrozole and self stopped in 09/2018.  #Newly diagnosis of left DCIS, high-grade.  Mammogram images were independent reviewed by me and discussed. Reviewed pathology with patient. The DCIS diagnosis and care plan were discussed with patient in detail.  We discussed that DCIS is a non invasive breast cancer,  cancer is only in the duct and has not spread into the tissues around it. Given that DCIS is high grade, recommend resection.  Will refer patient back to Dr. Fleet Contras for lumpectomy. I discussed with patient that if lumpectomy showed no invasive components, she will need adjuvant radiation therapy followed by antiestrogen treatments if DCIS is ER positive. If there is invasive component, then she will need sentinel lymph node biopsy, further testing will need of adjuvant chemo followed by radiation. Communicated with Dr. Fleet Contras who will see patient tomorrow.   #Osteoporosis, bone density showed osteoporosis.  Continue calcium and vitamin D. We discussed about bisphosphonate or Prolia treatment.  She has not changed her mind.  She declines. #Family history of cancer, personal history of invasive breast cancer.  No newly diagnosed DCIS, I discussed with patient about genetic testing.   Patient declines.  We spent sufficient time to discuss many aspect of care, questions were answered to patient's satisfaction. Total face to face encounter time for this patient visit was 40 min. >50% of the time was  spent in counseling and coordination of care.   She will follow-up 1 week after lumpectomy to go over pathology results and further management plan.  Earlie Server, MD  11/07/2019 , 8:12 PM

## 2019-11-08 DIAGNOSIS — C50312 Malignant neoplasm of lower-inner quadrant of left female breast: Secondary | ICD-10-CM | POA: Diagnosis not present

## 2019-11-08 DIAGNOSIS — I4891 Unspecified atrial fibrillation: Secondary | ICD-10-CM | POA: Diagnosis not present

## 2019-11-10 ENCOUNTER — Other Ambulatory Visit: Payer: Self-pay | Admitting: *Deleted

## 2019-11-12 ENCOUNTER — Other Ambulatory Visit: Payer: Self-pay | Admitting: General Surgery

## 2019-11-12 DIAGNOSIS — D0512 Intraductal carcinoma in situ of left breast: Secondary | ICD-10-CM

## 2019-11-14 ENCOUNTER — Inpatient Hospital Stay: Payer: Medicare HMO

## 2019-11-18 ENCOUNTER — Other Ambulatory Visit: Payer: Self-pay | Admitting: Physician Assistant

## 2019-11-18 DIAGNOSIS — I10 Essential (primary) hypertension: Secondary | ICD-10-CM

## 2019-11-19 ENCOUNTER — Other Ambulatory Visit: Payer: Self-pay | Admitting: General Surgery

## 2019-11-23 ENCOUNTER — Other Ambulatory Visit: Payer: Self-pay

## 2019-11-23 ENCOUNTER — Encounter
Admission: RE | Admit: 2019-11-23 | Discharge: 2019-11-23 | Disposition: A | Discharge status: Home / Self Care | Payer: Medicare HMO | Source: Ambulatory Visit | Attending: General Surgery | Admitting: General Surgery

## 2019-11-23 DIAGNOSIS — Z01810 Encounter for preprocedural cardiovascular examination: Secondary | ICD-10-CM | POA: Insufficient documentation

## 2019-11-23 DIAGNOSIS — I4891 Unspecified atrial fibrillation: Secondary | ICD-10-CM | POA: Diagnosis not present

## 2019-11-23 DIAGNOSIS — Z01812 Encounter for preprocedural laboratory examination: Secondary | ICD-10-CM | POA: Diagnosis not present

## 2019-11-23 DIAGNOSIS — I10 Essential (primary) hypertension: Secondary | ICD-10-CM | POA: Diagnosis not present

## 2019-11-23 HISTORY — DX: Cardiac arrhythmia, unspecified: I49.9

## 2019-11-23 LAB — BASIC METABOLIC PANEL
Anion gap: 9 (ref 5–15)
BUN: 15 mg/dL (ref 8–23)
CO2: 26 mmol/L (ref 22–32)
Calcium: 8.7 mg/dL — ABNORMAL LOW (ref 8.9–10.3)
Chloride: 102 mmol/L (ref 98–111)
Creatinine, Ser: 0.91 mg/dL (ref 0.44–1.00)
GFR calc Af Amer: >60 mL/min (ref >60)
GFR calc non Af Amer: >60 mL/min (ref >60)
Glucose, Bld: 119 mg/dL — ABNORMAL HIGH (ref 70–99)
Potassium: 3.6 mmol/L (ref 3.5–5.1)
Sodium: 137 mmol/L (ref 135–145)

## 2019-11-23 LAB — CBC
HCT: 36.4 % (ref 36.0–46.0)
Hemoglobin: 11.7 g/dL — ABNORMAL LOW (ref 12.0–15.0)
MCH: 26.2 pg (ref 26.0–34.0)
MCHC: 32.1 g/dL (ref 30.0–36.0)
MCV: 81.4 fL (ref 80.0–100.0)
Platelets: 228 10*3/uL (ref 150–400)
RBC: 4.47 MIL/uL (ref 3.87–5.11)
RDW: 15.9 % — ABNORMAL HIGH (ref 11.5–15.5)
WBC: 9.7 10*3/uL (ref 4.0–10.5)
nRBC: 0 % (ref 0.0–0.2)

## 2019-11-23 LAB — PROTIME-INR
INR: 2.1 — ABNORMAL HIGH (ref 0.8–1.2)
Prothrombin Time: 23.2 seconds — ABNORMAL HIGH (ref 11.4–15.2)

## 2019-11-23 NOTE — Patient Instructions (Addendum)
Your procedure is scheduled on: Wednesday 11/30/19.  Report to DAY SURGERY DEPARTMENT LOCATED ON 2ND FLOOR MEDICAL MALL ENTRANCE. To find out your arrival time please call [phone] between 1PM - 3PM on Tuesday 11/29/19.   Remember: Instructions that are not followed completely may result in serious medical risk, up to and including death, or upon the discretion of your surgeon and anesthesiologist your surgery may need to be rescheduled.      _X__ 1. Do not eat food after midnight the night before your procedure.                 No gum chewing or hard candies. You may drink clear liquids up to 2 hours                 before you are scheduled to arrive for your surgery- DO NOT drink clear                 liquids within 2 hours of the start of your surgery.                 Clear Liquids include:  water, apple juice without pulp, clear carbohydrate                 drink such as Clearfast or Gatorade, Black Coffee or Tea (Do not add                 milk or creamer to coffee or tea).    __X__2.  On the morning of surgery brush your teeth with toothpaste and water, you may rinse your mouth with mouthwash if you wish.  Do not swallow any toothpaste or mouthwash.       __X__3.  Notify your doctor if there is any change in your medical condition      (cold, fever, infections).       Do not wear jewelry, make-up, hairpins, clips or nail polish. Do not wear lotions, powders, or perfumes.  Do not shave 48 hours prior to surgery. Men may shave face and neck. Do not bring valuables to the hospital.     [facility] is not responsible for any belongings or valuables.    Contacts, dentures/partials or body piercings may not be worn into surgery. Bring a case for your contacts, glasses or hearing aids, a denture cup will be supplied.     Patients discharged the day of surgery will not be allowed to drive home.     Please read over the following fact sheets that you were given:   MRSA  Information    __X__ Take these medicines the morning of surgery with A SIP OF WATER:     1. escitalopram (LEXAPRO) 10 MG tablet  2. omeprazole (PRILOSEC) 20 MG capsule  3. acetaminophen (TYLENOL) 500 MG tablet if needed      __X__ Use CHG Soap as directed    __X__ Stop Blood Thinners: Coumadin. The patient has been instructed to discontinue her coumadin five days prior to the procedure.She may continue on her pediatric ASA. Your last dose of Coumadin will be on Thursday 12/3.   __X__ Stop Anti-inflammatories 7 days before surgery such as Advil, Ibuprofen, Motrin, BC or Goodies Powder, Naprosyn, Naproxen, Aleve, Meloxicam. May take Tylenol if needed for pain or discomfort.    __X__ Don't start taking any new herbal supplements before your procedure.

## 2019-11-23 NOTE — Progress Notes (Addendum)
Pre-Admit Testing Provider Notification Note  Provider Notified: Dr. Ubaldo Glassing  Notification Mode: Fax  Reason: PT/INR results  Response: Fax confirmation received.  Additional Information:   PT/INR collected for patient as she was getting pre-op lab work anyway and she was due for INR collection the same day.   Dr. Bary Castilla notified as well via [place] for his information.  Signed: Beulah Gandy, RN

## 2019-11-25 ENCOUNTER — Other Ambulatory Visit
Admission: RE | Admit: 2019-11-25 | Discharge: 2019-11-25 | Disposition: A | Discharge status: Home / Self Care | Payer: Medicare HMO | Source: Ambulatory Visit | Attending: General Surgery | Admitting: General Surgery

## 2019-11-25 ENCOUNTER — Other Ambulatory Visit: Payer: Self-pay

## 2019-11-25 DIAGNOSIS — Z20828 Contact with and (suspected) exposure to other viral communicable diseases: Secondary | ICD-10-CM | POA: Insufficient documentation

## 2019-11-25 DIAGNOSIS — Z01812 Encounter for preprocedural laboratory examination: Secondary | ICD-10-CM | POA: Diagnosis not present

## 2019-11-25 LAB — SARS CORONAVIRUS 2 (TAT 6-24 HRS): SARS Coronavirus 2: NEGATIVE

## 2019-11-30 ENCOUNTER — Other Ambulatory Visit: Payer: Self-pay

## 2019-11-30 ENCOUNTER — Ambulatory Visit: Payer: Medicare HMO | Admitting: Certified Registered Nurse Anesthetist

## 2019-11-30 ENCOUNTER — Encounter
Admission: RE | Disposition: A | Discharge status: Home / Self Care | Payer: Self-pay | Source: Home / Self Care | Attending: General Surgery

## 2019-11-30 ENCOUNTER — Ambulatory Visit
Admission: RE | Admit: 2019-11-30 | Discharge: 2019-11-30 | Disposition: A | Discharge status: Home / Self Care | Payer: Medicare HMO | Source: Ambulatory Visit | Attending: General Surgery | Admitting: General Surgery

## 2019-11-30 ENCOUNTER — Ambulatory Visit
Admission: RE | Admit: 2019-11-30 | Discharge: 2019-11-30 | Disposition: A | Discharge status: Home / Self Care | Payer: Medicare HMO | Attending: General Surgery | Admitting: General Surgery

## 2019-11-30 DIAGNOSIS — Z882 Allergy status to sulfonamides status: Secondary | ICD-10-CM | POA: Insufficient documentation

## 2019-11-30 DIAGNOSIS — K219 Gastro-esophageal reflux disease without esophagitis: Secondary | ICD-10-CM | POA: Diagnosis not present

## 2019-11-30 DIAGNOSIS — C50912 Malignant neoplasm of unspecified site of left female breast: Secondary | ICD-10-CM | POA: Diagnosis not present

## 2019-11-30 DIAGNOSIS — Z9011 Acquired absence of right breast and nipple: Secondary | ICD-10-CM | POA: Insufficient documentation

## 2019-11-30 DIAGNOSIS — Z888 Allergy status to other drugs, medicaments and biological substances status: Secondary | ICD-10-CM | POA: Insufficient documentation

## 2019-11-30 DIAGNOSIS — Z881 Allergy status to other antibiotic agents status: Secondary | ICD-10-CM | POA: Diagnosis not present

## 2019-11-30 DIAGNOSIS — Z7982 Long term (current) use of aspirin: Secondary | ICD-10-CM | POA: Diagnosis not present

## 2019-11-30 DIAGNOSIS — M1990 Unspecified osteoarthritis, unspecified site: Secondary | ICD-10-CM | POA: Insufficient documentation

## 2019-11-30 DIAGNOSIS — I10 Essential (primary) hypertension: Secondary | ICD-10-CM | POA: Diagnosis not present

## 2019-11-30 DIAGNOSIS — D0512 Intraductal carcinoma in situ of left breast: Secondary | ICD-10-CM | POA: Diagnosis not present

## 2019-11-30 DIAGNOSIS — Z923 Personal history of irradiation: Secondary | ICD-10-CM | POA: Insufficient documentation

## 2019-11-30 DIAGNOSIS — Z79899 Other long term (current) drug therapy: Secondary | ICD-10-CM | POA: Insufficient documentation

## 2019-11-30 DIAGNOSIS — F419 Anxiety disorder, unspecified: Secondary | ICD-10-CM | POA: Insufficient documentation

## 2019-11-30 DIAGNOSIS — Z9221 Personal history of antineoplastic chemotherapy: Secondary | ICD-10-CM | POA: Diagnosis not present

## 2019-11-30 DIAGNOSIS — I4891 Unspecified atrial fibrillation: Secondary | ICD-10-CM | POA: Diagnosis not present

## 2019-11-30 DIAGNOSIS — Z7901 Long term (current) use of anticoagulants: Secondary | ICD-10-CM | POA: Insufficient documentation

## 2019-11-30 DIAGNOSIS — E7800 Pure hypercholesterolemia, unspecified: Secondary | ICD-10-CM | POA: Diagnosis not present

## 2019-11-30 DIAGNOSIS — C50312 Malignant neoplasm of lower-inner quadrant of left female breast: Secondary | ICD-10-CM | POA: Diagnosis not present

## 2019-11-30 HISTORY — PX: SIMPLE MASTECTOMY WITH AXILLARY SENTINEL NODE BIOPSY: SHX6098

## 2019-11-30 LAB — PROTIME-INR
INR: 1.2 (ref 0.8–1.2)
Prothrombin Time: 14.8 seconds (ref 11.4–15.2)

## 2019-11-30 SURGERY — SIMPLE MASTECTOMY WITH AXILLARY SENTINEL NODE BIOPSY
Anesthesia: General | Laterality: Left

## 2019-11-30 MED ORDER — FENTANYL CITRATE (PF) 100 MCG/2ML IJ SOLN
INTRAMUSCULAR | Status: AC
  Administered 2019-11-30: 25 ug via INTRAVENOUS
  Filled 2019-11-30: qty 2

## 2019-11-30 MED ORDER — ACETAMINOPHEN 10 MG/ML IV SOLN
INTRAVENOUS | Status: DC | PRN
  Administered 2019-11-30: 1000 mg via INTRAVENOUS

## 2019-11-30 MED ORDER — LIDOCAINE HCL (CARDIAC) PF 100 MG/5ML IV SOSY
PREFILLED_SYRINGE | INTRAVENOUS | Status: DC | PRN
  Administered 2019-11-30: 100 mg via INTRAVENOUS

## 2019-11-30 MED ORDER — LACTATED RINGERS IV SOLN
INTRAVENOUS | Status: DC
  Administered 2019-11-30: 12:00:00 via INTRAVENOUS

## 2019-11-30 MED ORDER — ROCURONIUM BROMIDE 100 MG/10ML IV SOLN
INTRAVENOUS | Status: DC | PRN
  Administered 2019-11-30: 50 mg via INTRAVENOUS
  Administered 2019-11-30: 20 mg via INTRAVENOUS

## 2019-11-30 MED ORDER — TECHNETIUM TC 99M SULFUR COLLOID FILTERED
1.0000 | Freq: Once | INTRAVENOUS | Status: AC | PRN
  Administered 2019-11-30: 0.66 via INTRADERMAL

## 2019-11-30 MED ORDER — METHYLENE BLUE 0.5 % INJ SOLN
INTRAVENOUS | Status: DC | PRN
  Administered 2019-11-30: 5 mL via SUBMUCOSAL

## 2019-11-30 MED ORDER — ONDANSETRON HCL 4 MG/2ML IJ SOLN
INTRAMUSCULAR | Status: AC
  Filled 2019-11-30: qty 2

## 2019-11-30 MED ORDER — FENTANYL CITRATE (PF) 100 MCG/2ML IJ SOLN
INTRAMUSCULAR | Status: DC | PRN
  Administered 2019-11-30 (×2): 50 ug via INTRAVENOUS

## 2019-11-30 MED ORDER — ROCURONIUM BROMIDE 50 MG/5ML IV SOLN
INTRAVENOUS | Status: AC
  Filled 2019-11-30: qty 2

## 2019-11-30 MED ORDER — ONDANSETRON HCL 4 MG/2ML IJ SOLN
INTRAMUSCULAR | Status: DC | PRN
  Administered 2019-11-30: 4 mg via INTRAVENOUS

## 2019-11-30 MED ORDER — SUGAMMADEX SODIUM 500 MG/5ML IV SOLN
INTRAVENOUS | Status: AC
  Filled 2019-11-30: qty 5

## 2019-11-30 MED ORDER — SUCCINYLCHOLINE CHLORIDE 20 MG/ML IJ SOLN
INTRAMUSCULAR | Status: AC
  Filled 2019-11-30: qty 1

## 2019-11-30 MED ORDER — ONDANSETRON HCL 4 MG/2ML IJ SOLN
4.0000 mg | Freq: Once | INTRAMUSCULAR | Status: AC | PRN
  Administered 2019-11-30: 4 mg via INTRAVENOUS

## 2019-11-30 MED ORDER — LIDOCAINE HCL (PF) 2 % IJ SOLN
INTRAMUSCULAR | Status: AC
  Filled 2019-11-30: qty 10

## 2019-11-30 MED ORDER — KETOROLAC TROMETHAMINE 30 MG/ML IJ SOLN
INTRAMUSCULAR | Status: AC
  Filled 2019-11-30: qty 1

## 2019-11-30 MED ORDER — CEFAZOLIN SODIUM-DEXTROSE 2-4 GM/100ML-% IV SOLN
2.0000 g | INTRAVENOUS | Status: AC
  Administered 2019-11-30: 14:00:00 2 g via INTRAVENOUS

## 2019-11-30 MED ORDER — PHENYLEPHRINE HCL (PRESSORS) 10 MG/ML IV SOLN
INTRAVENOUS | Status: AC
  Filled 2019-11-30: qty 1

## 2019-11-30 MED ORDER — FENTANYL CITRATE (PF) 100 MCG/2ML IJ SOLN
INTRAMUSCULAR | Status: AC
  Filled 2019-11-30: qty 2

## 2019-11-30 MED ORDER — FENTANYL CITRATE (PF) 100 MCG/2ML IJ SOLN
25.0000 ug | INTRAMUSCULAR | Status: AC | PRN
  Administered 2019-11-30 (×4): 25 ug via INTRAVENOUS

## 2019-11-30 MED ORDER — DEXAMETHASONE SODIUM PHOSPHATE 10 MG/ML IJ SOLN
INTRAMUSCULAR | Status: DC | PRN
  Administered 2019-11-30: 10 mg via INTRAVENOUS

## 2019-11-30 MED ORDER — SUGAMMADEX SODIUM 500 MG/5ML IV SOLN
INTRAVENOUS | Status: DC | PRN
  Administered 2019-11-30: 260 mg via INTRAVENOUS

## 2019-11-30 MED ORDER — PHENYLEPHRINE HCL (PRESSORS) 10 MG/ML IV SOLN
INTRAVENOUS | Status: DC | PRN
  Administered 2019-11-30: 50 ug via INTRAVENOUS
  Administered 2019-11-30 (×3): 100 ug via INTRAVENOUS

## 2019-11-30 MED ORDER — KETOROLAC TROMETHAMINE 30 MG/ML IJ SOLN
INTRAMUSCULAR | Status: DC | PRN
  Administered 2019-11-30: 15 mg via INTRAVENOUS

## 2019-11-30 MED ORDER — CEFAZOLIN SODIUM-DEXTROSE 2-4 GM/100ML-% IV SOLN
INTRAVENOUS | Status: AC
  Filled 2019-11-30: qty 100

## 2019-11-30 MED ORDER — ACETAMINOPHEN 10 MG/ML IV SOLN
INTRAVENOUS | Status: AC
  Filled 2019-11-30: qty 100

## 2019-11-30 MED ORDER — DEXAMETHASONE SODIUM PHOSPHATE 10 MG/ML IJ SOLN
INTRAMUSCULAR | Status: AC
  Filled 2019-11-30: qty 1

## 2019-11-30 MED ORDER — HYDROCODONE-ACETAMINOPHEN 5-325 MG PO TABS: 1.0000 | ORAL_TABLET | ORAL | 0 refills | Status: DC | PRN

## 2019-11-30 MED ORDER — PROPOFOL 10 MG/ML IV BOLUS
INTRAVENOUS | Status: AC
  Filled 2019-11-30: qty 40

## 2019-11-30 MED ORDER — PROPOFOL 10 MG/ML IV BOLUS
INTRAVENOUS | Status: DC | PRN
  Administered 2019-11-30: 150 mg via INTRAVENOUS
  Administered 2019-11-30: 50 mg via INTRAVENOUS

## 2019-11-30 MED ORDER — ONDANSETRON 4 MG PO TBDP: 4.0000 mg | ORAL_TABLET | ORAL | 0 refills | Status: DC | PRN

## 2019-11-30 MED ORDER — PHENYLEPHRINE HCL-NACL 10-0.9 MG/250ML-% IV SOLN
INTRAVENOUS | Status: DC | PRN
  Administered 2019-11-30: 50 ug/min via INTRAVENOUS

## 2019-11-30 SURGICAL SUPPLY — 54 items
APPLIER CLIP 11 MED OPEN (CLIP)
APPLIER CLIP 13 LRG OPEN (CLIP)
BLADE PHOTON ILLUMINATED (MISCELLANEOUS) IMPLANT
BLADE SURG 15 STRL SS SAFETY (BLADE) ×3 IMPLANT
BULB RESERV EVAC DRAIN JP 100C (MISCELLANEOUS) ×4 IMPLANT
CANISTER SUCT 1200ML W/VALVE (MISCELLANEOUS) ×3 IMPLANT
CHLORAPREP W/TINT 26 (MISCELLANEOUS) ×3 IMPLANT
CLIP APPLIE 11 MED OPEN (CLIP) IMPLANT
CLIP APPLIE 13 LRG OPEN (CLIP) IMPLANT
CLOSURE WOUND 1/2 X4 (GAUZE/BANDAGES/DRESSINGS) ×2
CNTNR SPEC 2.5X3XGRAD LEK (MISCELLANEOUS) ×3
CONT SPEC 4OZ STER OR WHT (MISCELLANEOUS) ×6
CONTAINER SPEC 2.5X3XGRAD LEK (MISCELLANEOUS) ×3 IMPLANT
COVER WAND RF STERILE (DRAPES) ×3 IMPLANT
DRAIN CHANNEL JP 15F RND 16 (MISCELLANEOUS) ×4 IMPLANT
DRAPE LAPAROTOMY TRNSV 106X77 (MISCELLANEOUS) ×3 IMPLANT
DRSG GAUZE FLUFF 36X18 (GAUZE/BANDAGES/DRESSINGS) ×3 IMPLANT
DRSG TEGADERM 4X4.75 (GAUZE/BANDAGES/DRESSINGS) ×4 IMPLANT
DRSG TELFA 3X8 NADH (GAUZE/BANDAGES/DRESSINGS) ×3 IMPLANT
ELECT CAUTERY BLADE TIP 2.5 (TIP) ×3
ELECT REM PT RETURN 9FT ADLT (ELECTROSURGICAL) ×3
ELECTRODE CAUTERY BLDE TIP 2.5 (TIP) ×1 IMPLANT
ELECTRODE REM PT RTRN 9FT ADLT (ELECTROSURGICAL) ×1 IMPLANT
GLOVE BIO SURGEON STRL SZ7.5 (GLOVE) ×3 IMPLANT
GLOVE INDICATOR 8.0 STRL GRN (GLOVE) ×3 IMPLANT
GOWN STRL REUS W/ TWL LRG LVL3 (GOWN DISPOSABLE) ×2 IMPLANT
GOWN STRL REUS W/TWL LRG LVL3 (GOWN DISPOSABLE) ×4
KIT MARKER MARGIN INK (KITS) IMPLANT
LABEL OR SOLS (LABEL) ×3 IMPLANT
PACK BASIN MINOR ARMC (MISCELLANEOUS) ×3 IMPLANT
PAD DRESSING TELFA 3X8 NADH (GAUZE/BANDAGES/DRESSINGS) ×1 IMPLANT
PIN SAFETY STRL (MISCELLANEOUS) ×3 IMPLANT
RETRACTOR RING XSMALL (MISCELLANEOUS) ×1 IMPLANT
RTRCTR WOUND ALEXIS 13CM XS SH (MISCELLANEOUS) ×3
SHEARS FOC LG CVD HARMONIC 17C (MISCELLANEOUS) IMPLANT
SLEVE PROBE SENORX GAMMA FIND (MISCELLANEOUS) ×3 IMPLANT
SPONGE DRAIN TRACH 4X4 STRL 2S (GAUZE/BANDAGES/DRESSINGS) ×4 IMPLANT
SPONGE LAP 18X18 RF (DISPOSABLE) ×3 IMPLANT
STRIP CLOSURE SKIN 1/2X4 (GAUZE/BANDAGES/DRESSINGS) ×4 IMPLANT
SUT ETHILON 3-0 FS-10 30 BLK (SUTURE) ×3
SUT SILK 2 0 (SUTURE) ×2
SUT SILK 2-0 30XBRD TIE 12 (SUTURE) ×1 IMPLANT
SUT SILK 3 0 (SUTURE) ×2
SUT SILK 3-0 18XBRD TIE 12 (SUTURE) ×1 IMPLANT
SUT VIC AB 2-0 CT1 27 (SUTURE) ×8
SUT VIC AB 2-0 CT1 TAPERPNT 27 (SUTURE) ×4 IMPLANT
SUT VIC AB 3-0 SH 27 (SUTURE) ×2
SUT VIC AB 3-0 SH 27X BRD (SUTURE) ×1 IMPLANT
SUT VIC AB 4-0 SH 27 (SUTURE) ×2
SUT VIC AB 4-0 SH 27XANBCTRL (SUTURE) IMPLANT
SUT VICRYL+ 3-0 144IN (SUTURE) ×3 IMPLANT
SUTURE EHLN 3-0 FS-10 30 BLK (SUTURE) ×1 IMPLANT
SWABSTK COMLB BENZOIN TINCTURE (MISCELLANEOUS) ×3 IMPLANT
TAPE TRANSPORE STRL 2 31045 (GAUZE/BANDAGES/DRESSINGS) ×1 IMPLANT

## 2019-11-30 NOTE — Anesthesia Postprocedure Evaluation (Signed)
Anesthesia Post Note  Patient: Erin Romero  Procedure(s) Performed: SIMPLE MASTECTOMY WITH AXILLARY SENTINEL NODE BIOPSY (Left )  Patient location during evaluation: PACU Anesthesia Type: General Level of consciousness: awake and alert Pain management: pain level controlled Vital Signs Assessment: post-procedure vital signs reviewed and stable Respiratory status: spontaneous breathing, nonlabored ventilation and respiratory function stable Cardiovascular status: blood pressure returned to baseline and stable Postop Assessment: no apparent nausea or vomiting Anesthetic complications: no     Last Vitals:  Vitals:   11/30/19 1709 11/30/19 1754  BP: (!) 137/98   Pulse: (!) 101   Resp: 18 20  Temp: 36.8 C   SpO2: 97%     Last Pain:  Vitals:   11/30/19 1754  TempSrc:   PainSc: [place]

## 2019-11-30 NOTE — Anesthesia Post-op Follow-up Note (Signed)
Anesthesia QCDR form completed.        

## 2019-11-30 NOTE — Anesthesia Procedure Notes (Signed)
Procedure Name: Intubation Date/Time: 11/30/2019 1:31 PM Performed by: Lowry Bowl, CRNA Pre-anesthesia Checklist: Patient identified, Emergency Drugs available, Suction available and Patient being monitored Patient Re-evaluated:Patient Re-evaluated prior to induction Oxygen Delivery Method: Circle system utilized Preoxygenation: Pre-oxygenation with 100% oxygen Induction Type: IV induction and Cricoid Pressure applied Ventilation: Mask ventilation without difficulty Laryngoscope Size: McGraph and 4 Grade View: Grade II Tube type: Oral Tube size: 7.0 mm Number of attempts: 2 (Unable to pass ETT thru VC on 1st attempt; 2nd attempt with flexible bougie successful; Both attempts with McGraph.) Airway Equipment and Method: Stylet,  Video-laryngoscopy and Bougie stylet Placement Confirmation: ETT inserted through vocal cords under direct vision,  positive ETCO2 and breath sounds checked- equal and bilateral Secured at: 21 cm Tube secured with: Tape Dental Injury: Teeth and Oropharynx as per pre-operative assessment  Difficulty Due To: Difficulty was anticipated and Difficult Airway- due to anterior larynx Future Recommendations: Recommend- induction with short-acting agent, and alternative techniques readily available

## 2019-11-30 NOTE — Discharge Instructions (Signed)
AMBULATORY SURGERY  °DISCHARGE INSTRUCTIONS ° ° °1) The drugs that you were given will stay in your system until tomorrow so for the next 24 hours you should not: ° °A) Drive an automobile °B) Make any legal decisions °C) Drink any alcoholic beverage ° ° °2) You may resume regular meals tomorrow.  Today it is better to start with liquids and gradually work up to solid foods. ° °You may eat anything you prefer, but it is better to start with liquids, then soup and crackers, and gradually work up to solid foods. ° ° °3) Please notify your doctor immediately if you have any unusual bleeding, trouble breathing, redness and pain at the surgery site, drainage, fever, or pain not relieved by medication. ° °Please contact your physician with any problems or Same Day Surgery at [phone], Monday through Friday 6 am to 4 pm, or [place] [facility] [place] [facility] number at [phone]. °

## 2019-11-30 NOTE — Op Note (Signed)
Preoperative diagnosis: DCIS of the left breast.  Postoperative diagnosis: Same.  Operative procedure: Left simple mastectomy with sentinel node biopsy.  Operating surgeon: Hervey Ard, MD  Anesthesia: General endotracheal.  Estimated blood loss: 50 cc.  Clinical note: This 68 year old woman had previously undergone a right mastectomy.  Recent mammogram showed new calcifications and biopsy showed evidence of DCIS.  Considering the marked asymmetry she elected to proceed to mastectomy.  She was injected with technetium sulfur colloid the morning of the procedure.  She received Ancef 2 g intravenously on induction of anesthesia.  Operative note: After the induction of anesthesia the area of the nipple was cleansed with alcohol and 5 cc of 0.5% methylene blue instilled.  The breast chest and axilla was then cleansed with ChloraPrep and draped.  The "F" cup breast was then marked for an elliptical incision.  The skin was incised sharply.  The remaining dissection was completed using the PlasmaBlade.  Flaps were elevated to the sternum medially, rectus fascia inferiorly, serratus muscle laterally and the clavicle superiorly.  A single hot blue node was appreciated in the axilla with counts of 2000.  The breast was elevated off the underlying pectoralis muscle taking the fascia with the breast specimen.  This was orientated medial and lateral and sent fresh to pathology.  The wound was then irrigated with sterile water.  Meticulous hemostasis was achieved electrocautery as well as 3-0 Vicryl ties and suture ligatures.  Due to the volume of dead space it was elected to place 2-15 [place] Blake drains.  These were brought out through the inferior medial flap and anchored into place with 3-0 nylon sutures.  The skin flaps were then approximated with a running 2-0 Vicryl deep dermal suture in 2 segments.  Bites of the lateral muscle fascia were used to help minimize dead space.  Benzoin and Steri-Strips  were applied.  Telfa, fluff gauze and a compressive wrap was placed.  Patient tolerated the procedure well and was taken recovery in stable condition.

## 2019-11-30 NOTE — Anesthesia Preprocedure Evaluation (Signed)
Anesthesia Evaluation  Patient identified by MRN, date of birth, ID band Patient awake    Reviewed: Allergy & Precautions, H&P , NPO status , Patient's Chart, lab work & pertinent test results, reviewed documented beta blocker date and time   Airway Mallampati: III  TM Distance: >3 FB Neck ROM: limited    Dental no notable dental hx. (+) Teeth Intact   Pulmonary neg pulmonary ROS, neg shortness of breath,    Pulmonary exam normal breath sounds clear to auscultation       Cardiovascular Exercise Tolerance: Good hypertension, Pt. on medications and Pt. on home beta blockers (-) angina(-) Past MI and (-) DOE Normal cardiovascular exam+ dysrhythmias Atrial Fibrillation  Rhythm:regular Rate:Normal     Neuro/Psych  Headaches, PSYCHIATRIC DISORDERS Anxiety Depression    GI/Hepatic Neg liver ROS, GERD  Controlled,  Endo/Other  negative endocrine ROS  Renal/GU negative Renal ROS  negative genitourinary   Musculoskeletal  (+) Arthritis , Osteoarthritis,    Abdominal   Peds  Hematology negative hematology ROS (+)   Anesthesia Other Findings Past Medical History:   Hypertension                                    2005         Atrial fibrillation ([place])                       2014         Lump or mass in breast                          2014           Comment:right mastectomy   Fibrocystic breast disease                      2005         Malignant neoplasm of central portion of femal* February *     Comment:stage II,T2, N1a. Mastectomy with sentinel node              biopsy. 2 sentinel nodes negative, non-sentinel              node w/ 5 mm macrometastatic foci in               non-sentinel node.  Received post mastectomy               radiation.    Anxiety                                                      GERD (gastroesophageal reflux disease)                       Cancer (HCC)                                                  Comment:Breast   Past Surgical History:   BREAST SURGERY  Right [id number]      Comment:biopsy   BREAST SURGERY                                  Right 2014           Comment:mastectomy   KNEE SURGERY                                    Left 2005         ABDOMINAL HYSTERECTOMY                           1999         thumb surg                                      Right 2004         TONSILLECTOMY                                                   Comment:age of 3   COLONOSCOPY                                      2002         BREAST BIOPSY                                   Left 2015           Comment:Negative   MASTECTOMY                                      Right 2014           Comment:radiation chemo  BMI    Body Mass Index   39.45 kg/m 2      Reproductive/Obstetrics negative OB ROS                             Anesthesia Physical  Anesthesia Plan  ASA: III  Anesthesia Plan: General   Post-op Pain Management:    Induction: Intravenous  PONV Risk Score and Plan:   Airway Management Planned: Oral ETT and Video Laryngoscope Planned  Additional Equipment:   Intra-op Plan:   Post-operative Plan: Extubation in OR  Informed Consent: I have reviewed the patients History and Physical, chart, labs and discussed the procedure including the risks, benefits and alternatives for the proposed anesthesia with the patient or authorized representative who has indicated his/her understanding and acceptance.     Dental Advisory Given  Plan Discussed with: Anesthesiologist, CRNA and Surgeon  Anesthesia Plan Comments:         Anesthesia Quick Evaluation

## 2019-11-30 NOTE — Transfer of Care (Signed)
Immediate Anesthesia Transfer of Care Note  Patient: Erin Romero  Procedure(s) Performed: SIMPLE MASTECTOMY WITH AXILLARY SENTINEL NODE BIOPSY (Left )  Patient Location: PACU  Anesthesia Type:General  Level of Consciousness: awake, drowsy and patient cooperative  Airway & Oxygen Therapy: Patient Spontanous Breathing and Patient connected to face mask oxygen  Post-op Assessment: Report given to RN, Post -op Vital signs reviewed and stable and Patient moving all extremities  Post vital signs: Reviewed and stable  Last Vitals:  Vitals Value Taken Time  BP 145/84 11/30/19 1540  Temp 36.2 C 11/30/19 1540  Pulse 47 11/30/19 1544  Resp 15 11/30/19 1544  SpO2 99 % 11/30/19 1544  Vitals shown include unvalidated device data.  Last Pain:  Vitals:   11/30/19 1128  TempSrc: Tympanic  PainSc: 6          Complications: No apparent anesthesia complications

## 2019-11-30 NOTE — H&P (Signed)
Erin Romero [id number] 10/25/1951     HPI: 68 year old woman with recent diagnosis of left breast DCIS.  Considering her contralateral mastectomy she desired to proceed with a left mastectomy.  We will complete sentinel node biopsy on the off chance she is upstaged to invasive cancer.  Medications Prior to Admission  Medication Sig Dispense Refill Last Dose  . acetaminophen (TYLENOL) 500 MG tablet Take 1,500 mg by mouth every 4 (four) hours as needed for moderate pain.    11/29/2019 at 0900  . amLODipine (NORVASC) 2.5 MG tablet TAKE 1 TABLET EVERY DAY (Patient taking differently: Take 2.5 mg by mouth daily. ) 90 tablet 3 11/29/2019 at 0900  . aspirin 81 MG chewable tablet Chew 81 mg by mouth daily.   11/29/2019 at 0900  . atorvastatin (LIPITOR) 10 MG tablet TAKE 1 TABLET EVERY DAY (Patient taking differently: Take 10 mg by mouth daily. ) 90 tablet 1 11/29/2019 at 1900  . bisoprolol-hydrochlorothiazide (ZIAC) 10-6.25 MG tablet Take 1 tablet by mouth daily. 90 tablet 1 11/29/2019 at 0900  . Calcium Carb-Cholecalciferol (OYSTER SHELL CALCIUM + D PO) Take 1 tablet by mouth daily.    11/29/2019 at 0900  . Cholecalciferol (VITAMIN D-3) 25 MCG (1000 UT) CAPS Take 1,000 Units by mouth daily.    11/29/2019 at 0900  . escitalopram (LEXAPRO) 10 MG tablet TAKE 1 TABLET EVERY DAY (Patient taking differently: Take 10 mg by mouth daily. ) 90 tablet 1 11/30/2019 at 0700  . hydrochlorothiazide (HYDRODIURIL) 12.5 MG tablet TAKE 1 TABLET EVERY DAY 90 tablet 0 11/29/2019 at 0900  . lisinopril (ZESTRIL) 20 MG tablet TAKE 1 TABLET EVERY DAY (Patient taking differently: Take 20 mg by mouth daily. ) 90 tablet 1 11/29/2019 at 0900  . omeprazole (PRILOSEC) 20 MG capsule TAKE 1 CAPSULE EVERY DAY (Patient taking differently: Take 20 mg by mouth daily. ) 90 capsule 1 11/30/2019 at 0700  . potassium chloride SA (KLOR-CON) 20 MEQ tablet TAKE 1 TABLET EVERY DAY (Patient taking differently: Take 20 mEq by mouth daily. ) 90 tablet 1  11/29/2019 at 0900  . traZODone (DESYREL) 50 MG tablet TAKE 1 TABLET EVERY DAY (Patient taking differently: Take 50 mg by mouth at bedtime. ) 90 tablet 1 11/29/2019 at 0900  . warfarin (COUMADIN) 1 MG tablet Take 1.5 mg by mouth daily.   Past Week at 1200   Allergies  Allergen Reactions  . Taxotere [Docetaxel] Itching and Rash  . Sulfa Antibiotics Hives   Past Medical History:  Diagnosis Date  . Anxiety   . Atrial fibrillation ([place]) 2014  . Breast cancer ([place]) 2014   right breast  . Dysrhythmia   . Fibrocystic breast disease 2005  . GERD (gastroesophageal reflux disease)   . Hypertension 2005  . Lump or mass in breast 2014   right mastectomy  . Malignant neoplasm of central portion of female breast ([place]) 01/24/2013   Stage II,T2, N1a. ER/PR 90%, HER-2/neu: Not overexpressed. Mastectomy with sentinel node biopsy. 2 sentinel nodes negative, non-sentinel node w/ 5 mm macrometastatic foci in non-sentinel node.  Received post mastectomy radiation.    Past Surgical History:  Procedure Laterality Date  . ABDOMINAL HYSTERECTOMY  1999  . BREAST BIOPSY Left 2015   BENIGN FIBROTIC BREAST TISSUE WITH CLUSTERED APOCRINE  . BREAST BIOPSY Left 11/02/2019   affirm bx, x marker, path pending  . BREAST SURGERY Right 2005,2014   biopsy  . BREAST SURGERY Right 2014   mastectomy  . COLONOSCOPY  2002  .  COLONOSCOPY WITH PROPOFOL N/A 10/16/2015   Procedure: COLONOSCOPY WITH PROPOFOL;  Surgeon: Robert Bellow, MD;  Location: [facility] ENDOSCOPY;  Service: Endoscopy;  Laterality: N/A;  . KNEE SURGERY Left 2005  . MASTECTOMY Right 2014   radiation chemo  . thumb surg Right 2004  . TONSILLECTOMY     age of 3   Social History   Socioeconomic History  . Marital status: Widowed    Spouse name: Naphtali Riede  . Number of children: 0  . Years of education: 25  . Highest education level: 12th grade  Occupational History    Employer: UNEMPLOYED    Comment: disabilty from breast cancer  Social Needs   . Financial resource strain: Not hard at all  . Food insecurity    Worry: Never true    Inability: Never true  . Transportation needs    Medical: No    Non-medical: No  Tobacco Use  . Smoking status: Never Smoker  . Smokeless tobacco: Never Used  Substance and Sexual Activity  . Alcohol use: No  . Drug use: No  . Sexual activity: Not on file  Lifestyle  . Physical activity    Days per week: 0 days    Minutes per session: 0 min  . Stress: Not at all  Relationships  . Social Herbalist on phone: Patient refused    Gets together: Patient refused    Attends religious service: Patient refused    Active member of club or organization: Patient refused    Attends meetings of clubs or organizations: Patient refused    Relationship status: Patient refused  . Intimate partner violence    Fear of current or ex partner: Patient refused    Emotionally abused: Patient refused    Physically abused: Patient refused    Forced sexual activity: Patient refused  Other Topics Concern  . Not on file  Social History Narrative  . Not on file   Social History   Social History Narrative  . Not on file     ROS: Negative.     PE: HEENT: Negative. Lungs: Clear. Cardio: RR.   Assessment/Plan:  Proceed with planned left simple mastectomy and SLN biopsy.  Forest Gleason Martise Waddell 11/30/2019

## 2019-12-01 DIAGNOSIS — C50312 Malignant neoplasm of lower-inner quadrant of left female breast: Secondary | ICD-10-CM | POA: Diagnosis not present

## 2019-12-06 LAB — SURGICAL PATHOLOGY

## 2019-12-06 NOTE — Progress Notes (Signed)
Message sent to scheduling 

## 2019-12-09 ENCOUNTER — Other Ambulatory Visit: Payer: Self-pay | Admitting: Physician Assistant

## 2019-12-09 DIAGNOSIS — F419 Anxiety disorder, unspecified: Secondary | ICD-10-CM

## 2019-12-09 DIAGNOSIS — E7800 Pure hypercholesterolemia, unspecified: Secondary | ICD-10-CM

## 2019-12-12 ENCOUNTER — Inpatient Hospital Stay: Payer: Medicare HMO | Attending: Oncology | Admitting: Oncology

## 2019-12-12 ENCOUNTER — Encounter: Payer: Self-pay | Admitting: Oncology

## 2019-12-12 DIAGNOSIS — Z79899 Other long term (current) drug therapy: Secondary | ICD-10-CM | POA: Diagnosis not present

## 2019-12-12 DIAGNOSIS — D0512 Intraductal carcinoma in situ of left breast: Secondary | ICD-10-CM | POA: Diagnosis not present

## 2019-12-12 DIAGNOSIS — F419 Anxiety disorder, unspecified: Secondary | ICD-10-CM | POA: Insufficient documentation

## 2019-12-12 DIAGNOSIS — Z7901 Long term (current) use of anticoagulants: Secondary | ICD-10-CM | POA: Insufficient documentation

## 2019-12-12 DIAGNOSIS — Z9013 Acquired absence of bilateral breasts and nipples: Secondary | ICD-10-CM | POA: Insufficient documentation

## 2019-12-12 DIAGNOSIS — Z923 Personal history of irradiation: Secondary | ICD-10-CM | POA: Diagnosis not present

## 2019-12-12 DIAGNOSIS — Z170 Estrogen receptor positive status [ER+]: Secondary | ICD-10-CM | POA: Insufficient documentation

## 2019-12-12 DIAGNOSIS — M816 Localized osteoporosis [Lequesne]: Secondary | ICD-10-CM | POA: Insufficient documentation

## 2019-12-12 DIAGNOSIS — K219 Gastro-esophageal reflux disease without esophagitis: Secondary | ICD-10-CM | POA: Diagnosis not present

## 2019-12-12 DIAGNOSIS — I4891 Unspecified atrial fibrillation: Secondary | ICD-10-CM | POA: Diagnosis not present

## 2019-12-12 DIAGNOSIS — Z853 Personal history of malignant neoplasm of breast: Secondary | ICD-10-CM | POA: Diagnosis not present

## 2019-12-12 DIAGNOSIS — I10 Essential (primary) hypertension: Secondary | ICD-10-CM | POA: Diagnosis not present

## 2019-12-12 MED ORDER — LETROZOLE 2.5 MG PO TABS: 2.5000 mg | ORAL_TABLET | Freq: Every day | ORAL | 0 refills | Status: DC

## 2019-12-12 NOTE — Progress Notes (Signed)
Patient verified using two identifiers for virtual visit via telephone today.  Patient recently had a mastectomy and is doing well.

## 2019-12-12 NOTE — Progress Notes (Signed)
HEMATOLOGY-ONCOLOGY TeleHEALTH VISIT PROGRESS NOTE  I connected with Erin Romero on 12/12/19 at  9:30 AM EST by video enabled telemedicine visit and verified that I am speaking with the correct person using two identifiers. I discussed the limitations, risks, security and privacy concerns of performing an evaluation and management service by telemedicine and the availability of in-person appointments. I also discussed with the patient that there may be a patient responsible charge related to this service. The patient expressed understanding and agreed to proceed.   Other persons participating in the visit and their role in the encounter:  None  Patient's location: Home  Provider's location: office Chief Complaint: History of breast cancer, DCIS    INTERVAL HISTORY Erin Romero is a 68 y.o. female who has above history reviewed by me today presents virtually for follow up visit for management of history of breast cancer, DCIS Problems and complaints are listed below:  11/30/2019 patient underwent left simple mastectomy. Pathology showed residual DCIS.  Sclerosing adenosis, apocrine metaplasia, and ductal ectasia.  Recent biopsy site change with metallic marker.  Changes consistent with remote biopsy site in the upper inner quadrant.  Unremarkable skin or nipple.  Sentinel lymph node biopsy negative.  0/1 Today patient reports no new complaints except that she still has some soreness at the site of mastectomy.  She has no concerns of her mastectomy sites.   Review of Systems  Constitutional: Negative for appetite change, chills, fatigue and fever.  HENT:   Negative for hearing loss and voice change.   Eyes: Negative for eye problems.  Respiratory: Negative for chest tightness and cough.   Cardiovascular: Negative for chest pain.  Gastrointestinal: Negative for abdominal distention, abdominal pain and blood in stool.  Endocrine: Negative for hot flashes.  Genitourinary:  Negative for difficulty urinating and frequency.   Musculoskeletal: Negative for arthralgias.  Skin: Negative for itching and rash.       Soreness at the site of mastectomy  Neurological: Negative for extremity weakness.  Hematological: Negative for adenopathy.  Psychiatric/Behavioral: Negative for confusion.    Past Medical History:  Diagnosis Date  . Anxiety   . Atrial fibrillation ([place]) 2014  . Breast cancer ([place]) 2014   right breast  . Dysrhythmia   . Fibrocystic breast disease 2005  . GERD (gastroesophageal reflux disease)   . Hypertension 2005  . Lump or mass in breast 2014   right mastectomy  . Malignant neoplasm of central portion of female breast ([place]) 01/24/2013   Stage II,T2, N1a. ER/PR 90%, HER-2/neu: Not overexpressed. Mastectomy with sentinel node biopsy. 2 sentinel nodes negative, non-sentinel node w/ 5 mm macrometastatic foci in non-sentinel node.  Received post mastectomy radiation.    Past Surgical History:  Procedure Laterality Date  . ABDOMINAL HYSTERECTOMY  1999  . BREAST BIOPSY Left 2015   BENIGN FIBROTIC BREAST TISSUE WITH CLUSTERED APOCRINE  . BREAST BIOPSY Left 11/02/2019   affirm bx, x marker, path pending  . BREAST SURGERY Right 2005,2014   biopsy  . BREAST SURGERY Right 2014   mastectomy  . COLONOSCOPY  2002  . COLONOSCOPY WITH PROPOFOL N/A 10/16/2015   Procedure: COLONOSCOPY WITH PROPOFOL;  Surgeon: Robert Bellow, MD;  Location: [facility] ENDOSCOPY;  Service: Endoscopy;  Laterality: N/A;  . KNEE SURGERY Left 2005  . MASTECTOMY Right 2014   radiation chemo  . SIMPLE MASTECTOMY WITH AXILLARY SENTINEL NODE BIOPSY Left 11/30/2019   Procedure: SIMPLE MASTECTOMY WITH AXILLARY SENTINEL NODE BIOPSY;  Surgeon: Robert Bellow,  MD;  Location: [facility] ORS;  Service: General;  Laterality: Left;  . thumb surg Right 2004  . TONSILLECTOMY     age of 18    Family History  Problem Relation Age of Onset  . Heart disease Father   . Hypertension Sister   .  Cancer Mother        cervical  . Cancer Other        ovarian,liver,colon cancer-no family member listed  . Osteoporosis Paternal Aunt   . Osteoporosis Paternal Grandmother     Social History   Socioeconomic History  . Marital status: Widowed    Spouse name: Teara Duerksen  . Number of children: 0  . Years of education: 58  . Highest education level: 12th grade  Occupational History    Employer: UNEMPLOYED    Comment: disabilty from breast cancer  Tobacco Use  . Smoking status: Never Smoker  . Smokeless tobacco: Never Used  Substance and Sexual Activity  . Alcohol use: No  . Drug use: No  . Sexual activity: Not on file  Other Topics Concern  . Not on file  Social History Narrative  . Not on file   Social Determinants of Health   Financial Resource Strain:   . Difficulty of Paying Living Expenses: Not on file  Food Insecurity:   . Worried About Charity fundraiser in the Last Year: Not on file  . Ran Out of Food in the Last Year: Not on file  Transportation Needs:   . Lack of Transportation (Medical): Not on file  . Lack of Transportation (Non-Medical): Not on file  Physical Activity: Inactive  . Days of Exercise per Week: 0 days  . Minutes of Exercise per Session: 0 min  Stress:   . Feeling of Stress : Not on file  Social Connections: Unknown  . Frequency of Communication with Friends and Family: Patient refused  . Frequency of Social Gatherings with Friends and Family: Patient refused  . Attends Religious Services: Patient refused  . Active Member of Clubs or Organizations: Patient refused  . Attends Archivist Meetings: Patient refused  . Marital Status: Patient refused  Intimate Partner Violence: Unknown  . Fear of Current or Ex-Partner: Patient refused  . Emotionally Abused: Patient refused  . Physically Abused: Patient refused  . Sexually Abused: Patient refused    Current Outpatient Medications on File Prior to Visit  Medication Sig Dispense  Refill  . acetaminophen (TYLENOL) 500 MG tablet Take 1,500 mg by mouth every 4 (four) hours as needed for moderate pain.     Marland Kitchen amLODipine (NORVASC) 2.5 MG tablet TAKE 1 TABLET EVERY DAY (Patient taking differently: Take 2.5 mg by mouth daily. ) 90 tablet 3  . aspirin 81 MG chewable tablet Chew 81 mg by mouth daily.    Marland Kitchen atorvastatin (LIPITOR) 10 MG tablet TAKE 1 TABLET EVERY DAY 90 tablet 0  . bisoprolol-hydrochlorothiazide (ZIAC) 10-6.25 MG tablet Take 1 tablet by mouth daily. 90 tablet 1  . Calcium Carb-Cholecalciferol (OYSTER SHELL CALCIUM + D PO) Take 1 tablet by mouth daily.     . Cholecalciferol (VITAMIN D-3) 25 MCG (1000 UT) CAPS Take 1,000 Units by mouth daily.     Marland Kitchen escitalopram (LEXAPRO) 10 MG tablet TAKE 1 TABLET EVERY DAY 90 tablet 0  . hydrochlorothiazide (HYDRODIURIL) 12.5 MG tablet TAKE 1 TABLET EVERY DAY 90 tablet 0  . HYDROcodone-acetaminophen (NORCO/VICODIN) 5-325 MG tablet Take 1 tablet by mouth every 4 (four) hours as needed  for moderate pain. 20 tablet 0  . lisinopril (ZESTRIL) 20 MG tablet TAKE 1 TABLET EVERY DAY (Patient taking differently: Take 20 mg by mouth daily. ) 90 tablet 1  . omeprazole (PRILOSEC) 20 MG capsule TAKE 1 CAPSULE EVERY DAY (Patient taking differently: Take 20 mg by mouth daily. ) 90 capsule 1  . potassium chloride SA (KLOR-CON) 20 MEQ tablet TAKE 1 TABLET EVERY DAY (Patient taking differently: Take 20 mEq by mouth daily. ) 90 tablet 1  . traZODone (DESYREL) 50 MG tablet TAKE 1 TABLET EVERY DAY (Patient taking differently: Take 50 mg by mouth at bedtime. ) 90 tablet 1  . warfarin (COUMADIN) 2 MG tablet Take 2 mg by mouth daily.    . ondansetron (ZOFRAN ODT) 4 MG disintegrating tablet Take 1 tablet (4 mg total) by mouth every 4 (four) hours as needed for nausea or vomiting. Take with Norco to prevent nausea. (Patient not taking: Reported on 12/12/2019) 20 tablet 0   No current facility-administered medications on file prior to visit.    Allergies   Allergen Reactions  . Taxotere [Docetaxel] Itching and Rash  . Sulfa Antibiotics Hives       Observations/Objective: Today's Vitals   12/12/19 0950  PainSc: 4    There is no height or weight on file to calculate BMI.  Physical Exam  Constitutional: She is oriented to person, place, and time. No distress.  Neurological: She is alert and oriented to person, place, and time.  Psychiatric: Mood normal.    CBC    Component Value Date/Time   WBC 9.7 11/23/2019 1149   RBC 4.47 11/23/2019 1149   HGB 11.7 (L) 11/23/2019 1149   HGB 12.6 08/09/2019 1112   HCT 36.4 11/23/2019 1149   HCT 39.4 08/09/2019 1112   PLT 228 11/23/2019 1149   PLT 229 08/09/2019 1112   MCV 81.4 11/23/2019 1149   MCV 82 08/09/2019 1112   MCV 86 12/20/2014 1100   MCH 26.2 11/23/2019 1149   MCHC 32.1 11/23/2019 1149   RDW 15.9 (H) 11/23/2019 1149   RDW 15.8 (H) 08/09/2019 1112   RDW 15.5 (H) 12/20/2014 1100   LYMPHSABS 1.8 08/16/2019 0847   LYMPHSABS 1.8 08/09/2019 1112   LYMPHSABS 1.7 12/20/2014 1100   MONOABS 0.7 08/16/2019 0847   MONOABS 0.7 12/20/2014 1100   EOSABS 0.1 08/16/2019 0847   EOSABS 0.1 08/09/2019 1112   EOSABS 0.1 12/20/2014 1100   BASOSABS 0.1 08/16/2019 0847   BASOSABS 0.1 08/09/2019 1112   BASOSABS 0.1 12/20/2014 1100    CMP     Component Value Date/Time   NA 137 11/23/2019 1149   NA 140 08/09/2019 1112   NA 142 12/20/2014 1100   K 3.6 11/23/2019 1149   K 3.5 12/20/2014 1100   CL 102 11/23/2019 1149   CL 101 12/20/2014 1100   CO2 26 11/23/2019 1149   CO2 32 12/20/2014 1100   GLUCOSE 119 (H) 11/23/2019 1149   GLUCOSE 116 (H) 12/20/2014 1100   BUN 15 11/23/2019 1149   BUN 13 08/09/2019 1112   BUN 13 12/20/2014 1100   CREATININE 0.91 11/23/2019 1149   CREATININE 0.86 12/20/2014 1100   CALCIUM 8.7 (L) 11/23/2019 1149   CALCIUM 8.9 12/20/2014 1100   PROT 7.4 08/16/2019 0847   PROT 7.3 08/09/2019 1112   PROT 7.4 12/20/2014 1100   ALBUMIN 3.6 08/16/2019 0847   ALBUMIN  4.2 08/09/2019 1112   ALBUMIN 3.4 12/20/2014 1100   AST 21 08/16/2019 0847  AST 16 12/20/2014 1100   ALT 14 08/16/2019 0847   ALT 21 12/20/2014 1100   ALKPHOS 72 08/16/2019 0847   ALKPHOS 73 12/20/2014 1100   BILITOT 0.8 08/16/2019 0847   BILITOT 0.5 08/09/2019 1112   BILITOT 0.5 12/20/2014 1100   GFRNONAA >60 11/23/2019 1149   GFRNONAA >60 12/20/2014 1100   GFRNONAA >60 06/15/2014 1020   GFRAA >60 11/23/2019 1149   GFRAA >60 12/20/2014 1100   GFRAA >60 06/15/2014 1020     Assessment and Plan: 1. Ductal carcinoma in situ (DCIS) of left breast   2. History of breast cancer   3. Localized osteoporosis without current pathological fracture     DCIS of left breast, status post mastectomy. Pathology was reviewed and discussed with patient. No need for adjuvant chemotherapy or radiation  I recommend adjuvant antiestrogen treatments with letrozole 2.5 mg daily.  Prescription was sent to patient's pharmacy.   Other options ie, tamoxifen was discussed.  Due to patient being on chronic anticoagulation on Coumadin, tamoxifen may interact/increased effect of warfarin.  History of stage IIB right breast cancer, patient has finished approximately 5 years of letrozole and is self stopped in October 2020. We discussed about resuming letrozole 2.5 mg daily.  She agrees with the plan. Continue surveillance.  Osteoporosis, with previously discussed about bisphosphonate or Prolia treatments.  She declined at that time. Today again we discussed at length the risk factors [race, post-menopausal status, use of AI]; prevention/treatment strategies- including but not limited to exercise calcium and vitamin D twice a day. Recommend Zometa or Prolia Discussed the potential side effects including but not limited to- hypocalcemia and ONJ; discussed not to have invasive dental procedures few weeks around the time of the injections.  Recommend patient to obtain dental clearance patient prefers to defer at  this point. Recommend patient to continue oral calcium and vitamin D supplementation.  Follow Up Instructions: Early February   I discussed the assessment and treatment plan with the patient. The patient was provided an opportunity to ask questions and all were answered. The patient agreed with the plan and demonstrated an understanding of the instructions.  The patient was advised to call back or seek an in-person evaluation if the symptoms worsen or if the condition fails to improve as anticipated.   Earlie Server, MD 12/12/2019 1:15 PM

## 2019-12-14 DIAGNOSIS — I4891 Unspecified atrial fibrillation: Secondary | ICD-10-CM | POA: Diagnosis not present

## 2019-12-14 DIAGNOSIS — Z7901 Long term (current) use of anticoagulants: Secondary | ICD-10-CM | POA: Diagnosis not present

## 2019-12-20 ENCOUNTER — Encounter: Payer: Self-pay | Admitting: *Deleted

## 2019-12-26 DIAGNOSIS — I4891 Unspecified atrial fibrillation: Secondary | ICD-10-CM | POA: Diagnosis not present

## 2019-12-26 DIAGNOSIS — Z7901 Long term (current) use of anticoagulants: Secondary | ICD-10-CM | POA: Diagnosis not present

## 2020-01-16 ENCOUNTER — Other Ambulatory Visit: Payer: Self-pay | Admitting: Physician Assistant

## 2020-01-16 DIAGNOSIS — I10 Essential (primary) hypertension: Secondary | ICD-10-CM

## 2020-01-17 ENCOUNTER — Inpatient Hospital Stay: Payer: Medicare HMO | Attending: Oncology

## 2020-01-17 ENCOUNTER — Other Ambulatory Visit: Payer: Self-pay

## 2020-01-17 DIAGNOSIS — Z853 Personal history of malignant neoplasm of breast: Secondary | ICD-10-CM | POA: Diagnosis not present

## 2020-01-17 DIAGNOSIS — Z9013 Acquired absence of bilateral breasts and nipples: Secondary | ICD-10-CM | POA: Insufficient documentation

## 2020-01-17 DIAGNOSIS — C50111 Malignant neoplasm of central portion of right female breast: Secondary | ICD-10-CM

## 2020-01-17 DIAGNOSIS — Z923 Personal history of irradiation: Secondary | ICD-10-CM | POA: Insufficient documentation

## 2020-01-17 DIAGNOSIS — D0512 Intraductal carcinoma in situ of left breast: Secondary | ICD-10-CM | POA: Diagnosis not present

## 2020-01-17 DIAGNOSIS — Z170 Estrogen receptor positive status [ER+]: Secondary | ICD-10-CM | POA: Diagnosis not present

## 2020-01-17 LAB — CBC WITH DIFFERENTIAL/PLATELET
Abs Immature Granulocytes: 0.04 10*3/uL (ref 0.00–0.07)
Basophils Absolute: 0.1 10*3/uL (ref 0.0–0.1)
Basophils Relative: 1 %
Eosinophils Absolute: 0.1 10*3/uL (ref 0.0–0.5)
Eosinophils Relative: 1 %
HCT: 38.1 % (ref 36.0–46.0)
Hemoglobin: 12.1 g/dL (ref 12.0–15.0)
Immature Granulocytes: 0 %
Lymphocytes Relative: 21 %
Lymphs Abs: 2.3 10*3/uL (ref 0.7–4.0)
MCH: 26.1 pg (ref 26.0–34.0)
MCHC: 31.8 g/dL (ref 30.0–36.0)
MCV: 82.3 fL (ref 80.0–100.0)
Monocytes Absolute: 1.1 10*3/uL — ABNORMAL HIGH (ref 0.1–1.0)
Monocytes Relative: 10 %
Neutro Abs: 7.5 10*3/uL (ref 1.7–7.7)
Neutrophils Relative %: 67 %
Platelets: 258 10*3/uL (ref 150–400)
RBC: 4.63 MIL/uL (ref 3.87–5.11)
RDW: 15.5 % (ref 11.5–15.5)
WBC: 11 10*3/uL — ABNORMAL HIGH (ref 4.0–10.5)
nRBC: 0 % (ref 0.0–0.2)

## 2020-01-17 LAB — COMPREHENSIVE METABOLIC PANEL
ALT: 12 U/L (ref 0–44)
AST: 20 U/L (ref 15–41)
Albumin: 3.8 g/dL (ref 3.5–5.0)
Alkaline Phosphatase: 75 U/L (ref 38–126)
Anion gap: 8 (ref 5–15)
BUN: 15 mg/dL (ref 8–23)
CO2: 26 mmol/L (ref 22–32)
Calcium: 8.8 mg/dL — ABNORMAL LOW (ref 8.9–10.3)
Chloride: 103 mmol/L (ref 98–111)
Creatinine, Ser: 0.91 mg/dL (ref 0.44–1.00)
GFR calc Af Amer: >60 mL/min (ref >60)
GFR calc non Af Amer: >60 mL/min (ref >60)
Glucose, Bld: 113 mg/dL — ABNORMAL HIGH (ref 70–99)
Potassium: 3.7 mmol/L (ref 3.5–5.1)
Sodium: 137 mmol/L (ref 135–145)
Total Bilirubin: 0.6 mg/dL (ref 0.3–1.2)
Total Protein: 7.6 g/dL (ref 6.5–8.1)

## 2020-01-18 ENCOUNTER — Other Ambulatory Visit: Payer: Self-pay | Admitting: Physician Assistant

## 2020-01-18 DIAGNOSIS — I10 Essential (primary) hypertension: Secondary | ICD-10-CM

## 2020-01-18 LAB — CANCER ANTIGEN 27.29: CA 27.29: 22.6 U/mL (ref 0.0–38.6)

## 2020-01-24 ENCOUNTER — Other Ambulatory Visit: Payer: Medicare HMO

## 2020-01-24 ENCOUNTER — Other Ambulatory Visit: Payer: Self-pay

## 2020-01-24 ENCOUNTER — Encounter: Payer: Self-pay | Admitting: Oncology

## 2020-01-24 ENCOUNTER — Inpatient Hospital Stay: Payer: Medicare HMO | Attending: Oncology | Admitting: Oncology

## 2020-01-24 DIAGNOSIS — F419 Anxiety disorder, unspecified: Secondary | ICD-10-CM | POA: Insufficient documentation

## 2020-01-24 DIAGNOSIS — I4891 Unspecified atrial fibrillation: Secondary | ICD-10-CM | POA: Insufficient documentation

## 2020-01-24 DIAGNOSIS — M816 Localized osteoporosis [Lequesne]: Secondary | ICD-10-CM | POA: Diagnosis not present

## 2020-01-24 DIAGNOSIS — Z79899 Other long term (current) drug therapy: Secondary | ICD-10-CM | POA: Insufficient documentation

## 2020-01-24 DIAGNOSIS — Z7982 Long term (current) use of aspirin: Secondary | ICD-10-CM | POA: Diagnosis not present

## 2020-01-24 DIAGNOSIS — Z853 Personal history of malignant neoplasm of breast: Secondary | ICD-10-CM | POA: Insufficient documentation

## 2020-01-24 DIAGNOSIS — I10 Essential (primary) hypertension: Secondary | ICD-10-CM | POA: Insufficient documentation

## 2020-01-24 DIAGNOSIS — K219 Gastro-esophageal reflux disease without esophagitis: Secondary | ICD-10-CM | POA: Insufficient documentation

## 2020-01-24 DIAGNOSIS — D0512 Intraductal carcinoma in situ of left breast: Secondary | ICD-10-CM | POA: Insufficient documentation

## 2020-01-24 DIAGNOSIS — Z7901 Long term (current) use of anticoagulants: Secondary | ICD-10-CM | POA: Diagnosis not present

## 2020-01-24 MED ORDER — LETROZOLE 2.5 MG PO TABS: 2.5000 mg | ORAL_TABLET | Freq: Every day | ORAL | 0 refills | Status: DC

## 2020-01-24 NOTE — Progress Notes (Signed)
Patient verified using two identifiers for virtual visit via telephone today.  Patient reports having her mastectomy 11/30/2019

## 2020-01-24 NOTE — Progress Notes (Signed)
HEMATOLOGY-ONCOLOGY TeleHEALTH VISIT PROGRESS NOTE  I connected with [name] on 01/24/20 at  2:15 PM EST by video enabled telemedicine visit and verified that I am speaking with the correct person using two identifiers. I discussed the limitations, risks, security and privacy concerns of performing an evaluation and management service by telemedicine and the availability of in-person appointments. I also discussed with the patient that there may be a patient responsible charge related to this service. The patient expressed understanding and agreed to proceed.   Other persons participating in the visit and their role in the encounter:  None  Patient's location: Home  Provider's location: office Chief Complaint: Breast cancer and DCIS   INTERVAL HISTORY Erin Romero is a 69 y.o. female who has above history reviewed by me today presents for follow up visit for management of history of breast cancer and DCIS. Problems and complaints are listed below:  Patient was seen by me in December.  Patient was recommended to resume aromatase inhibitor letrozole 2.5 mg daily.. Patient has not started Letrozole yet.  She has no new complaints today.  Review of Systems  Constitutional: Negative for appetite change, chills, fatigue and fever.  HENT:   Negative for hearing loss and voice change.   Eyes: Negative for eye problems.  Respiratory: Negative for chest tightness and cough.   Cardiovascular: Negative for chest pain.  Gastrointestinal: Negative for abdominal distention, abdominal pain and blood in stool.  Endocrine: Negative for hot flashes.  Genitourinary: Negative for difficulty urinating and frequency.   Musculoskeletal: Negative for arthralgias.  Skin: Negative for itching and rash.  Neurological: Negative for extremity weakness.  Hematological: Negative for adenopathy.  Psychiatric/Behavioral: Negative for confusion.    Past Medical History:  Diagnosis Date  . Anxiety    . Atrial fibrillation ([place]) 2014  . Breast cancer ([place]) 2014   right breast  . Dysrhythmia   . Fibrocystic breast disease 2005  . GERD (gastroesophageal reflux disease)   . Hypertension 2005  . Lump or mass in breast 2014   right mastectomy  . Malignant neoplasm of central portion of female breast ([place]) 01/24/2013   Stage II,T2, N1a. ER/PR 90%, HER-2/neu: Not overexpressed. Mastectomy with sentinel node biopsy. 2 sentinel nodes negative, non-sentinel node w/ 5 mm macrometastatic foci in non-sentinel node.  Received post mastectomy radiation.    Past Surgical History:  Procedure Laterality Date  . ABDOMINAL HYSTERECTOMY  1999  . BREAST BIOPSY Left 2015   BENIGN FIBROTIC BREAST TISSUE WITH CLUSTERED APOCRINE  . BREAST BIOPSY Left 11/02/2019   affirm bx, x marker, path pending  . BREAST SURGERY Right 2005,2014   biopsy  . BREAST SURGERY Right 2014   mastectomy  . COLONOSCOPY  2002  . COLONOSCOPY WITH PROPOFOL N/A 10/16/2015   Procedure: COLONOSCOPY WITH PROPOFOL;  Surgeon: Robert Bellow, MD;  Location: [facility] ENDOSCOPY;  Service: Endoscopy;  Laterality: N/A;  . KNEE SURGERY Left 2005  . MASTECTOMY Right 2014   radiation chemo  . SIMPLE MASTECTOMY WITH AXILLARY SENTINEL NODE BIOPSY Left 11/30/2019   Procedure: SIMPLE MASTECTOMY WITH AXILLARY SENTINEL NODE BIOPSY;  Surgeon: Robert Bellow, MD;  Location: [facility] ORS;  Service: General;  Laterality: Left;  . thumb surg Right 2004  . TONSILLECTOMY     age of 69    Family History  Problem Relation Age of Onset  . Heart disease Father   . Hypertension Sister   . Cancer Mother        cervical  .  Cancer Other        ovarian,liver,colon cancer-no family member listed  . Osteoporosis Paternal Aunt   . Osteoporosis Paternal Grandmother     Social History   Socioeconomic History  . Marital status: Widowed    Spouse name: Shaniqua Guillot  . Number of children: 0  . Years of education: 62  . Highest education level: 12th grade   Occupational History    Employer: UNEMPLOYED    Comment: disabilty from breast cancer  Tobacco Use  . Smoking status: Never Smoker  . Smokeless tobacco: Never Used  Substance and Sexual Activity  . Alcohol use: No  . Drug use: No  . Sexual activity: Not on file  Other Topics Concern  . Not on file  Social History Narrative  . Not on file   Social Determinants of Health   Financial Resource Strain:   . Difficulty of Paying Living Expenses: Not on file  Food Insecurity:   . Worried About Charity fundraiser in the Last Year: Not on file  . Ran Out of Food in the Last Year: Not on file  Transportation Needs:   . Lack of Transportation (Medical): Not on file  . Lack of Transportation (Non-Medical): Not on file  Physical Activity: Inactive  . Days of Exercise per Week: 0 days  . Minutes of Exercise per Session: 0 min  Stress:   . Feeling of Stress : Not on file  Social Connections: Unknown  . Frequency of Communication with Friends and Family: Patient refused  . Frequency of Social Gatherings with Friends and Family: Patient refused  . Attends Religious Services: Patient refused  . Active Member of Clubs or Organizations: Patient refused  . Attends Archivist Meetings: Patient refused  . Marital Status: Patient refused  Intimate Partner Violence: Unknown  . Fear of Current or Ex-Partner: Patient refused  . Emotionally Abused: Patient refused  . Physically Abused: Patient refused  . Sexually Abused: Patient refused    Current Outpatient Medications on File Prior to Visit  Medication Sig Dispense Refill  . acetaminophen (TYLENOL) 500 MG tablet Take 1,500 mg by mouth every 4 (four) hours as needed for moderate pain.     Marland Kitchen amLODipine (NORVASC) 2.5 MG tablet Take 1 tablet (2.5 mg total) by mouth daily. 90 tablet 1  . aspirin 81 MG chewable tablet Chew 81 mg by mouth daily.    Marland Kitchen atorvastatin (LIPITOR) 10 MG tablet TAKE 1 TABLET EVERY DAY 90 tablet 0  .  bisoprolol-hydrochlorothiazide (ZIAC) 10-6.25 MG tablet Take 1 tablet by mouth daily. 90 tablet 1  . Calcium Carb-Cholecalciferol (OYSTER SHELL CALCIUM + D PO) Take 1 tablet by mouth daily.     . Cholecalciferol (VITAMIN D-3) 25 MCG (1000 UT) CAPS Take 1,000 Units by mouth daily.     Marland Kitchen escitalopram (LEXAPRO) 10 MG tablet TAKE 1 TABLET EVERY DAY 90 tablet 0  . hydrochlorothiazide (HYDRODIURIL) 12.5 MG tablet TAKE 1 TABLET EVERY DAY 90 tablet 0  . letrozole (FEMARA) 2.5 MG tablet Take 1 tablet (2.5 mg total) by mouth daily. 90 tablet 0  . lisinopril (ZESTRIL) 20 MG tablet TAKE 1 TABLET EVERY DAY (Patient taking differently: Take 20 mg by mouth daily. ) 90 tablet 1  . omeprazole (PRILOSEC) 20 MG capsule TAKE 1 CAPSULE EVERY DAY (Patient taking differently: Take 20 mg by mouth daily. ) 90 capsule 1  . potassium chloride SA (KLOR-CON) 20 MEQ tablet TAKE 1 TABLET EVERY DAY (Patient taking differently: Take  20 mEq by mouth daily. ) 90 tablet 1  . traZODone (DESYREL) 50 MG tablet TAKE 1 TABLET EVERY DAY (Patient taking differently: Take 50 mg by mouth at bedtime. ) 90 tablet 1  . warfarin (COUMADIN) 2 MG tablet Take 2 mg by mouth daily.     No current facility-administered medications on file prior to visit.    Allergies  Allergen Reactions  . Taxotere [Docetaxel] Itching and Rash  . Sulfa Antibiotics Hives       Observations/Objective: Today's Vitals   01/24/20 1333  PainSc: 0-No pain   There is no height or weight on file to calculate BMI.  Physical Exam  Constitutional: No distress.  Neurological: She is alert.  Psychiatric: Mood normal.    CBC    Component Value Date/Time   WBC 11.0 (H) 01/17/2020 1134   RBC 4.63 01/17/2020 1134   HGB 12.1 01/17/2020 1134   HGB 12.6 08/09/2019 1112   HCT 38.1 01/17/2020 1134   HCT 39.4 08/09/2019 1112   PLT 258 01/17/2020 1134   PLT 229 08/09/2019 1112   MCV 82.3 01/17/2020 1134   MCV 82 08/09/2019 1112   MCV 86 12/20/2014 1100   MCH 26.1  01/17/2020 1134   MCHC 31.8 01/17/2020 1134   RDW 15.5 01/17/2020 1134   RDW 15.8 (H) 08/09/2019 1112   RDW 15.5 (H) 12/20/2014 1100   LYMPHSABS 2.3 01/17/2020 1134   LYMPHSABS 1.8 08/09/2019 1112   LYMPHSABS 1.7 12/20/2014 1100   MONOABS 1.1 (H) 01/17/2020 1134   MONOABS 0.7 12/20/2014 1100   EOSABS 0.1 01/17/2020 1134   EOSABS 0.1 08/09/2019 1112   EOSABS 0.1 12/20/2014 1100   BASOSABS 0.1 01/17/2020 1134   BASOSABS 0.1 08/09/2019 1112   BASOSABS 0.1 12/20/2014 1100    CMP     Component Value Date/Time   NA 137 01/17/2020 1134   NA 140 08/09/2019 1112   NA 142 12/20/2014 1100   K 3.7 01/17/2020 1134   K 3.5 12/20/2014 1100   CL 103 01/17/2020 1134   CL 101 12/20/2014 1100   CO2 26 01/17/2020 1134   CO2 32 12/20/2014 1100   GLUCOSE 113 (H) 01/17/2020 1134   GLUCOSE 116 (H) 12/20/2014 1100   BUN 15 01/17/2020 1134   BUN 13 08/09/2019 1112   BUN 13 12/20/2014 1100   CREATININE 0.91 01/17/2020 1134   CREATININE 0.86 12/20/2014 1100   CALCIUM 8.8 (L) 01/17/2020 1134   CALCIUM 8.9 12/20/2014 1100   PROT 7.6 01/17/2020 1134   PROT 7.3 08/09/2019 1112   PROT 7.4 12/20/2014 1100   ALBUMIN 3.8 01/17/2020 1134   ALBUMIN 4.2 08/09/2019 1112   ALBUMIN 3.4 12/20/2014 1100   AST 20 01/17/2020 1134   AST 16 12/20/2014 1100   ALT 12 01/17/2020 1134   ALT 21 12/20/2014 1100   ALKPHOS 75 01/17/2020 1134   ALKPHOS 73 12/20/2014 1100   BILITOT 0.6 01/17/2020 1134   BILITOT 0.5 08/09/2019 1112   BILITOT 0.5 12/20/2014 1100   GFRNONAA >60 01/17/2020 1134   GFRNONAA >60 12/20/2014 1100   GFRNONAA >60 06/15/2014 1020   GFRAA >60 01/17/2020 1134   GFRAA >60 12/20/2014 1100   GFRAA >60 06/15/2014 1020     Assessment and Plan: 1. Ductal carcinoma in situ (DCIS) of left breast   2. History of breast cancer   3. Localized osteoporosis without current pathological fracture     #DCIS, left breast status post mastectomy. No need for adjuvant chemotherapy or radiation.  She has not  started aromatase inhibitor yet.  Rationale and side effects of letrozole were discussed with patient. Advised patient to resume letrozole 2.5 mg daily.  Prescription was sent to her [facility] mail service.  I also sent 1 month supply prescription to her [place].  Patient agrees with the plan and will pick up her prescription at the local pharmacy and start.  She will call her prescription mail service as well. Labs reviewed and discussed with patient.  History of Stage II right breast cancer, previously finished 5 years of letrozole and self stopped in October 2020. Continue surveillance. Osteoporosis, we discussed about rationale of bisphosphonate or Prolia treatment.  She declined. She reports that she has not got the pills from the hospital for her mastectomy yet.  She would like to wait until she paid off that be before she considers having dental clearance for treatment of bisphosphonate/Prolia. She understands that using of aromatase inhibitor letrozole will further increase her risk of pathologic fracture.    Follow Up Instructions: 6 months   I discussed the assessment and treatment plan with the patient. The patient was provided an opportunity to ask questions and all were answered. The patient agreed with the plan and demonstrated an understanding of the instructions.  The patient was advised to call back or seek an in-person evaluation if the symptoms worsen or if the condition fails to improve as anticipated.   Earlie Server, MD 01/24/2020 4:18 PM

## 2020-01-27 ENCOUNTER — Other Ambulatory Visit: Payer: Self-pay | Admitting: Physician Assistant

## 2020-01-27 DIAGNOSIS — I10 Essential (primary) hypertension: Secondary | ICD-10-CM

## 2020-02-08 NOTE — Progress Notes (Signed)
Patient: Erin Romero Female    DOB: [birth year]   69 y.o.   MRN: [id number] Visit Date: 02/09/2020  Today's Provider: Trinna Post, PA-C   Chief Complaint  Patient presents with  . Hypertension   Subjective:     HPI   Hypertension, follow-up:  BP Readings from Last 3 Encounters:  11/30/19 (!) 137/98  11/07/19 138/84  08/16/19 138/85    She was last seen for hypertension 1 years ago.  BP at that visit was 137/98. Management since that visit includes no changes.She reports excellent compliance with treatment. She is not having side effects.  She is not exercising. She is adherent to low salt diet.   Outside blood pressures are not being checked. She is experiencing irregular heart beat and palpitations.  Patient denies chest pain and chest pressure/discomfort.   Cardiovascular risk factors include advanced age (older than 28 for men, 22 for women), hypertension and obesity (BMI >= 30 kg/m2).  Use of agents associated with hypertension: none.   ------------------------------------------------------------------------    Lipid/Cholesterol, Follow-up:   Last seen for this 1 years ago.  Management since that visit includes no changes.  Last Lipid Panel:    Component Value Date/Time   CHOL 132 08/09/2019 1112   TRIG 133 08/09/2019 1112   HDL 50 08/09/2019 1112   CHOLHDL 2.6 08/09/2019 1112   CHOLHDL 3.5 11/27/2017 1149   LDLCALC 55 08/09/2019 1112   LDLCALC 102 (H) 11/27/2017 1149    She reports excellent compliance with treatment. She is not having side effects.   Wt Readings from Last 3 Encounters:  11/30/19 235 lb 14.3 oz (107 kg)  11/23/19 236 lb (107 kg)  11/07/19 235 lb 9.6 oz (106.9 kg)    ------------------------------------------------------------------------   Anxiety & Depression Patient presents today virtually for anxiety and depression follow-up. Patient is currently taking Lexapro.  Depression screen [facility] 2/9 02/09/2020  06/20/2019 06/15/2018 06/25/2017 05/21/2017  Decreased Interest 0 0 1 0 1  Down, Depressed, Hopeless 0 0 0 0 0  PHQ - 2 Score 0 0 1 0 1  Altered sleeping 0 - - 0 1  Tired, decreased energy 1 - - 1 3  Change in appetite 0 - - 0 0  Feeling bad or failure about yourself  0 - - 0 0  Trouble concentrating 0 - - 0 0  Moving slowly or fidgety/restless 0 - - 0 0  Suicidal thoughts 0 - - 0 0  PHQ-9 Score 1 - - 1 5  Difficult doing work/chores Not difficult at all - - - -   In the interim, she had a left mastectomy for DCIS in 11/2019 with Dr. Fleet Contras and is now on letrozole.   Allergies  Allergen Reactions  . Taxotere [Docetaxel] Itching and Rash  . Sulfa Antibiotics Hives     Current Outpatient Medications:  .  acetaminophen (TYLENOL) 500 MG tablet, Take 1,500 mg by mouth every 4 (four) hours as needed for moderate pain. , Disp: , Rfl:  .  amLODipine (NORVASC) 2.5 MG tablet, Take 1 tablet (2.5 mg total) by mouth daily., Disp: 90 tablet, Rfl: 1 .  aspirin 81 MG chewable tablet, Chew 81 mg by mouth daily., Disp: , Rfl:  .  atorvastatin (LIPITOR) 10 MG tablet, TAKE 1 TABLET EVERY DAY, Disp: 90 tablet, Rfl: 0 .  bisoprolol-hydrochlorothiazide (ZIAC) 10-6.25 MG tablet, Take 1 tablet by mouth daily., Disp: 90 tablet, Rfl: 1 .  Calcium Carb-Cholecalciferol (OYSTER SHELL  CALCIUM + D PO), Take 1 tablet by mouth daily. , Disp: , Rfl:  .  Cholecalciferol (VITAMIN D-3) 25 MCG (1000 UT) CAPS, Take 1,000 Units by mouth daily. , Disp: , Rfl:  .  escitalopram (LEXAPRO) 10 MG tablet, TAKE 1 TABLET EVERY DAY, Disp: 90 tablet, Rfl: 0 .  hydrochlorothiazide (HYDRODIURIL) 12.5 MG tablet, TAKE 1 TABLET EVERY DAY, Disp: 90 tablet, Rfl: 1 .  letrozole (FEMARA) 2.5 MG tablet, Take 1 tablet (2.5 mg total) by mouth daily., Disp: 30 tablet, Rfl: 0 .  lisinopril (ZESTRIL) 20 MG tablet, TAKE 1 TABLET EVERY DAY (Patient taking differently: Take 20 mg by mouth daily. ), Disp: 90 tablet, Rfl: 1 .  omeprazole (PRILOSEC) 20 MG  capsule, TAKE 1 CAPSULE EVERY DAY (Patient taking differently: Take 20 mg by mouth daily. ), Disp: 90 capsule, Rfl: 1 .  potassium chloride SA (KLOR-CON) 20 MEQ tablet, TAKE 1 TABLET EVERY DAY (Patient taking differently: Take 20 mEq by mouth daily. ), Disp: 90 tablet, Rfl: 1 .  traZODone (DESYREL) 50 MG tablet, TAKE 1 TABLET EVERY DAY (Patient taking differently: Take 50 mg by mouth at bedtime. ), Disp: 90 tablet, Rfl: 1 .  warfarin (COUMADIN) 2 MG tablet, Take 2 mg by mouth daily., Disp: , Rfl:   Review of Systems  ROS negative except for HPI.   Social History   Tobacco Use  . Smoking status: Never Smoker  . Smokeless tobacco: Never Used  Substance Use Topics  . Alcohol use: No      Objective:   There were no vitals taken for this visit. There were no vitals filed for this visit.There is no height or weight on file to calculate BMI.   Physical Exam   No results found for any visits on 02/09/20.     Assessment & Plan    1. Anxiety  Continue lexapro. Labs at next visit.   2. Essential hypertension  Continue medications.  3. Hypercholesteremia  Continue current medications.  4. Malignant neoplasm of central portion of right breast in female, estrogen receptor positive ([place])  Now had DCIS in left breast with mastectomy in 11/2019.  5. Ductal carcinoma in situ (DCIS) of left breast  Currently on letrozole and followed by oncology.   The entirety of the information documented in the History of Present Illness, Review of Systems and Physical Exam were personally obtained by me. Portions of this information were initially documented by Lynford Humphrey, CMA and reviewed by me for thoroughness and accuracy.   F/u 6 months chronic.      Trinna Post, PA-C  [facility]

## 2020-02-09 ENCOUNTER — Ambulatory Visit (INDEPENDENT_AMBULATORY_CARE_PROVIDER_SITE_OTHER): Payer: Medicare HMO | Admitting: Physician Assistant

## 2020-02-09 ENCOUNTER — Other Ambulatory Visit: Payer: Self-pay

## 2020-02-09 DIAGNOSIS — I10 Essential (primary) hypertension: Secondary | ICD-10-CM

## 2020-02-09 DIAGNOSIS — C50111 Malignant neoplasm of central portion of right female breast: Secondary | ICD-10-CM

## 2020-02-09 DIAGNOSIS — E7800 Pure hypercholesterolemia, unspecified: Secondary | ICD-10-CM

## 2020-02-09 DIAGNOSIS — F419 Anxiety disorder, unspecified: Secondary | ICD-10-CM

## 2020-02-09 DIAGNOSIS — D0512 Intraductal carcinoma in situ of left breast: Secondary | ICD-10-CM

## 2020-02-09 DIAGNOSIS — Z170 Estrogen receptor positive status [ER+]: Secondary | ICD-10-CM

## 2020-02-13 ENCOUNTER — Other Ambulatory Visit: Payer: Self-pay | Admitting: Physician Assistant

## 2020-02-13 ENCOUNTER — Other Ambulatory Visit: Payer: Medicare HMO

## 2020-02-13 ENCOUNTER — Ambulatory Visit: Payer: Medicare HMO | Admitting: Oncology

## 2020-02-13 DIAGNOSIS — F419 Anxiety disorder, unspecified: Secondary | ICD-10-CM

## 2020-02-13 DIAGNOSIS — E7800 Pure hypercholesterolemia, unspecified: Secondary | ICD-10-CM

## 2020-02-15 ENCOUNTER — Other Ambulatory Visit: Payer: Self-pay | Admitting: Physician Assistant

## 2020-02-15 DIAGNOSIS — G4700 Insomnia, unspecified: Secondary | ICD-10-CM

## 2020-02-22 DIAGNOSIS — I4891 Unspecified atrial fibrillation: Secondary | ICD-10-CM | POA: Diagnosis not present

## 2020-02-22 DIAGNOSIS — Z7901 Long term (current) use of anticoagulants: Secondary | ICD-10-CM | POA: Diagnosis not present

## 2020-03-07 ENCOUNTER — Other Ambulatory Visit: Payer: Self-pay | Admitting: Oncology

## 2020-03-29 DIAGNOSIS — Z6841 Body Mass Index (BMI) 40.0 and over, adult: Secondary | ICD-10-CM | POA: Diagnosis not present

## 2020-03-29 DIAGNOSIS — I482 Chronic atrial fibrillation, unspecified: Secondary | ICD-10-CM | POA: Diagnosis not present

## 2020-03-29 DIAGNOSIS — I4891 Unspecified atrial fibrillation: Secondary | ICD-10-CM | POA: Diagnosis not present

## 2020-04-11 ENCOUNTER — Other Ambulatory Visit: Payer: Self-pay | Admitting: Physician Assistant

## 2020-04-16 ENCOUNTER — Other Ambulatory Visit: Payer: Self-pay | Admitting: Physician Assistant

## 2020-04-16 DIAGNOSIS — F419 Anxiety disorder, unspecified: Secondary | ICD-10-CM

## 2020-04-23 DIAGNOSIS — H524 Presbyopia: Secondary | ICD-10-CM | POA: Diagnosis not present

## 2020-04-25 ENCOUNTER — Other Ambulatory Visit: Payer: Self-pay | Admitting: General Surgery

## 2020-04-26 DIAGNOSIS — Z7901 Long term (current) use of anticoagulants: Secondary | ICD-10-CM | POA: Diagnosis not present

## 2020-04-26 DIAGNOSIS — I4891 Unspecified atrial fibrillation: Secondary | ICD-10-CM | POA: Diagnosis not present

## 2020-05-24 DIAGNOSIS — C50312 Malignant neoplasm of lower-inner quadrant of left female breast: Secondary | ICD-10-CM | POA: Diagnosis not present

## 2020-05-28 DIAGNOSIS — Z7901 Long term (current) use of anticoagulants: Secondary | ICD-10-CM | POA: Diagnosis not present

## 2020-05-28 DIAGNOSIS — I4891 Unspecified atrial fibrillation: Secondary | ICD-10-CM | POA: Diagnosis not present

## 2020-06-02 ENCOUNTER — Other Ambulatory Visit: Payer: Self-pay | Admitting: Physician Assistant

## 2020-06-02 DIAGNOSIS — I10 Essential (primary) hypertension: Secondary | ICD-10-CM

## 2020-06-02 NOTE — Telephone Encounter (Signed)
Requested Prescriptions  Pending Prescriptions Disp Refills  . amLODipine (NORVASC) 2.5 MG tablet [Pharmacy Med Name: AMLODIPINE BESYLATE 2.5 MG Tablet] 90 tablet 0    Sig: TAKE 1 TABLET EVERY DAY     Cardiovascular:  Calcium Channel Blockers Failed - 06/02/2020  1:27 PM      Failed - Last BP in normal range    BP Readings from Last 1 Encounters:  11/30/19 (!) 137/98         Passed - Valid encounter within last 6 months    Recent Outpatient Visits          3 months ago [place] [facility], [place]   9 months ago Annual physical exam   [facility] Trinna Post, [place]   1 year ago Essential hypertension   [place], [place], [place]   1 year ago Osteoporosis, unspecified osteoporosis type, unspecified pathological fracture presence   [facility] Trinna Post, [place]   1 year ago Annual physical exam   [facility] Trinna Post, [place]      Future Appointments            In 2 weeks  [employer], [place]   In 2 months Terrilee Croak, Wendee Beavers, [place], [place]

## 2020-06-13 ENCOUNTER — Other Ambulatory Visit: Payer: Self-pay | Admitting: Physician Assistant

## 2020-06-13 DIAGNOSIS — I10 Essential (primary) hypertension: Secondary | ICD-10-CM

## 2020-06-13 NOTE — Telephone Encounter (Signed)
Medication last ordered 01/27/20 #90 tabs plus one refill. Patient should have enough medication thru 07/26/20. Refusing request-being requested too early.

## 2020-06-16 IMAGING — MG MM BREAST BX W LOC DEV 1ST LESION IMAGE BX SPEC STEREO GUIDE*L*
8 of 16 series · 8 of 20 positions shown · non-contrast
Comparison: Previous exams.
COMPARISON: Previous exams.

Addendum:
CLINICAL DATA: Patient presents for biopsy of calcifications in the
lower inner left breast

EXAM:
LEFT BREAST STEREOTACTIC CORE NEEDLE BIOPSY

[L (1 of 8)]
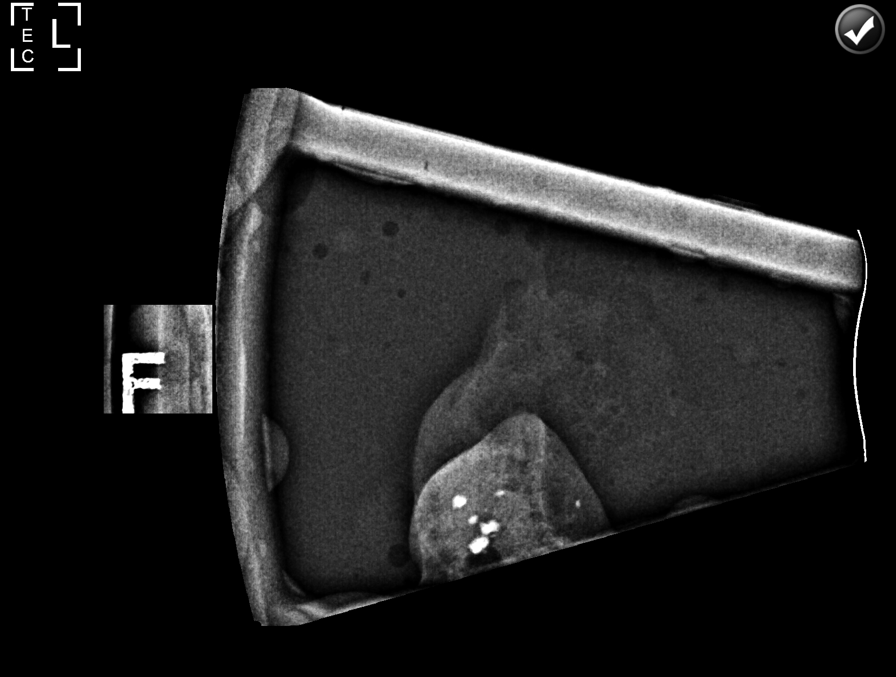

[L (2 of 8)]
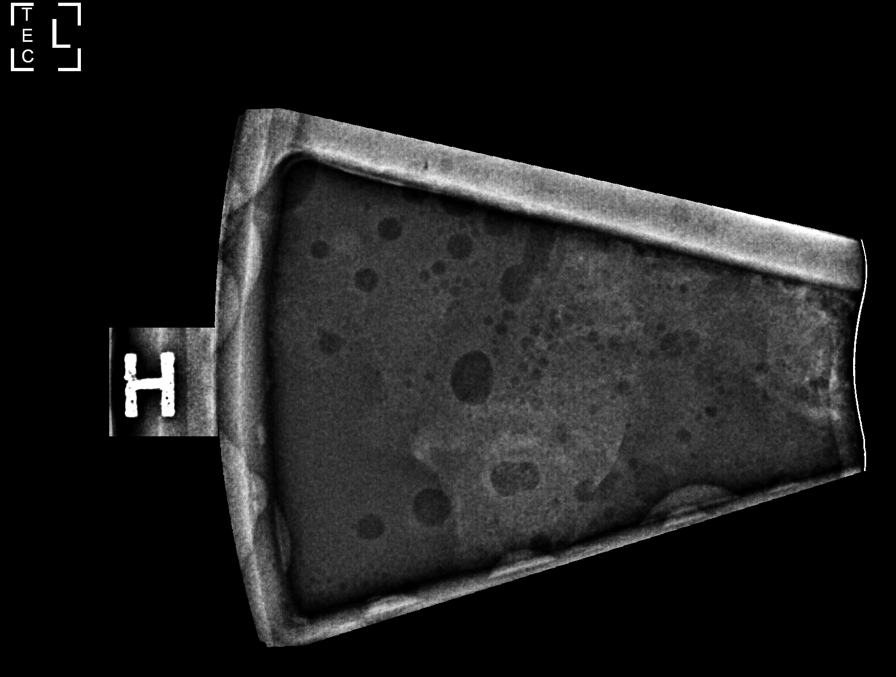

[L (3 of 8)]
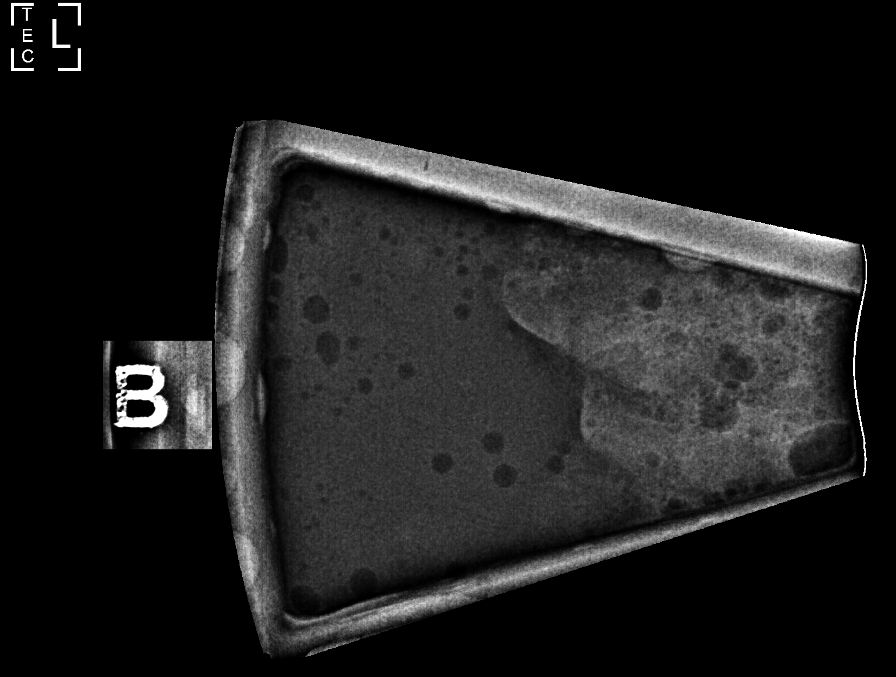

[L (4 of 8)]
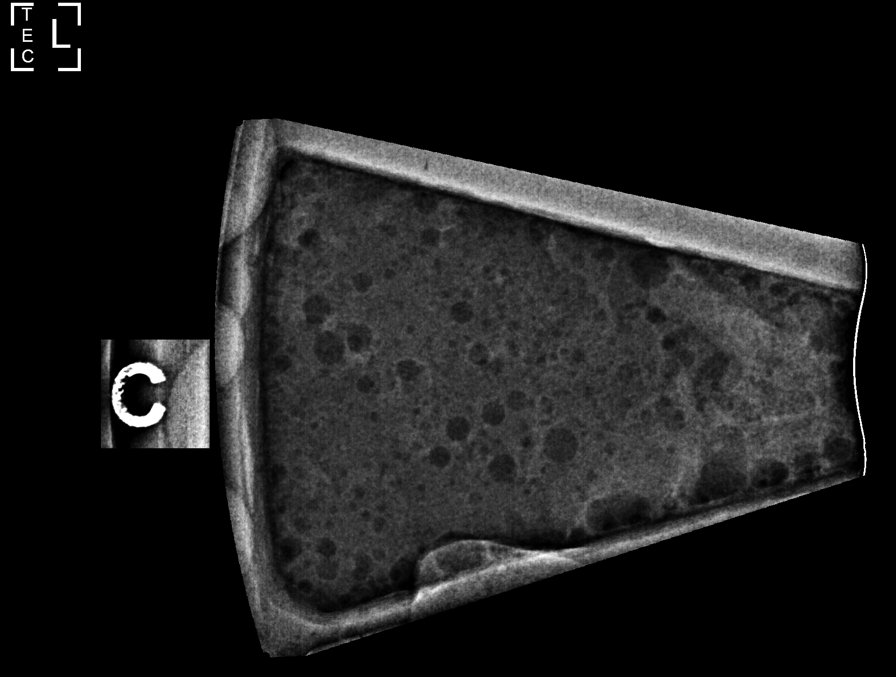

[L (5 of 8)]
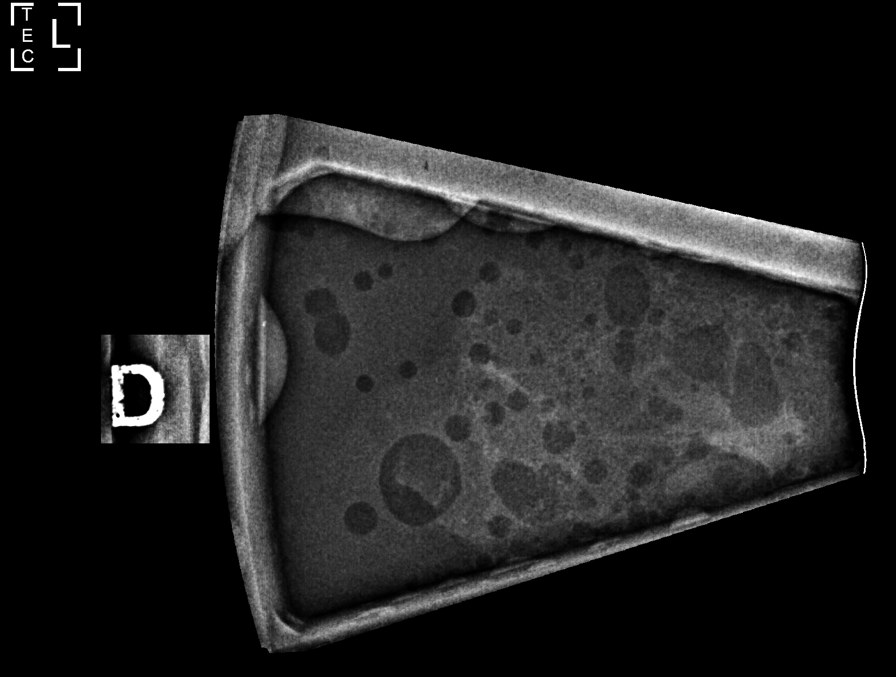

[L (6 of 8)]
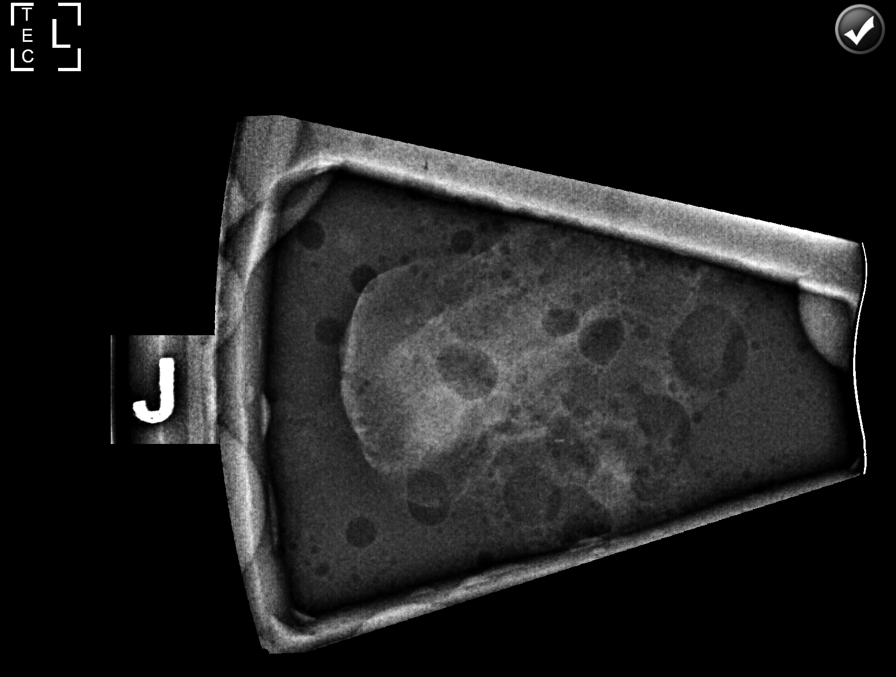

[L (7 of 8)]
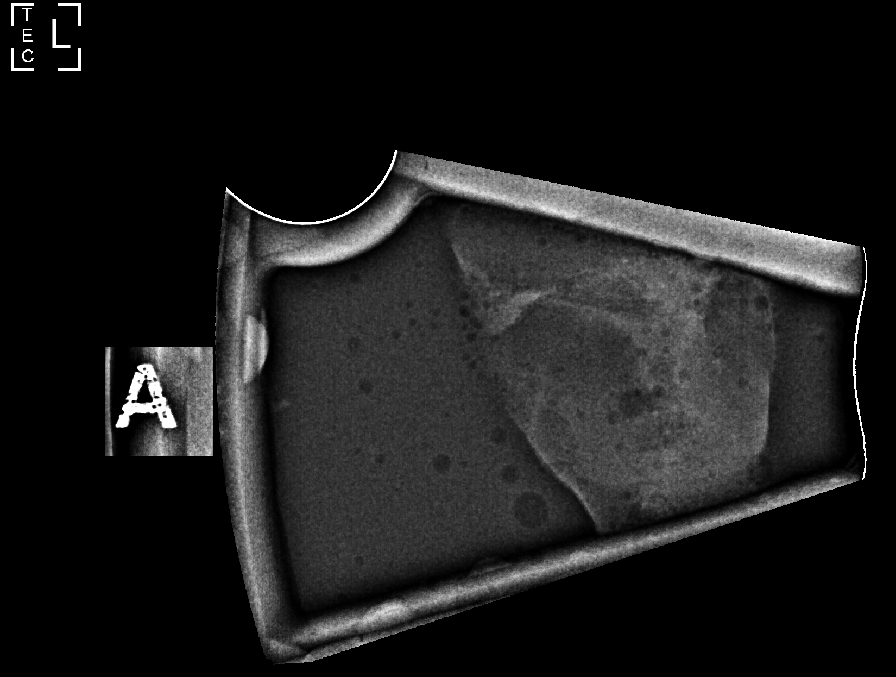

[L (8 of 8)]
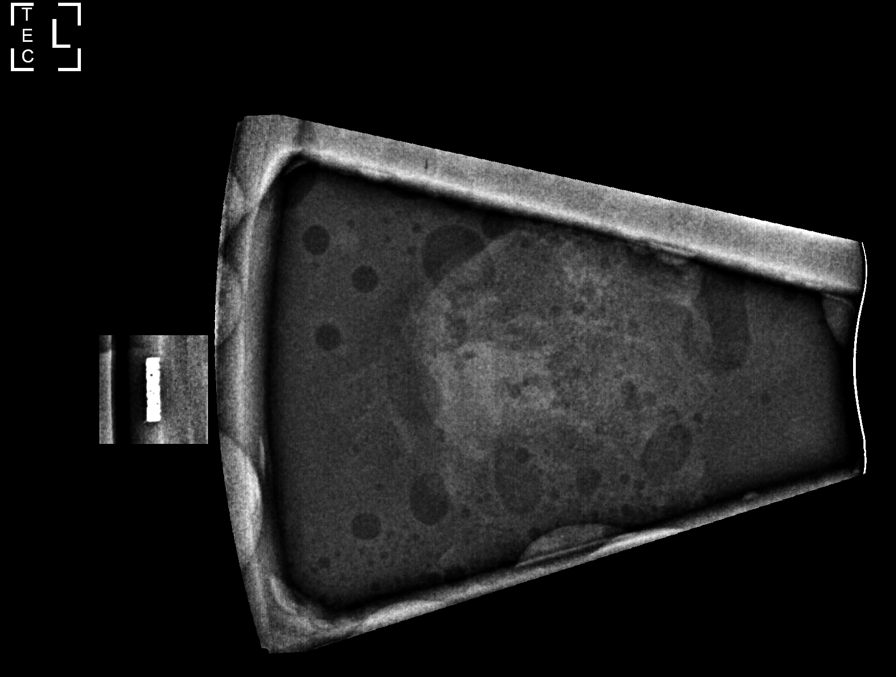

[8 of 20 positions shown; findings below may reference images not displayed]



Using sterile technique and 1% Lidocaine as local anesthetic, under
stereotactic guidance, a 9 gauge vacuum assisted device was used to
perform core needle biopsy of calcifications in the lower inner left
breast using a medial approach. Specimen radiograph was performed
showing 2 of 12 specimens with calcifications. Specimens with
calcifications are identified for pathology.

Lesion quadrant: Lower inner quadrant

At the conclusion of the procedure, an X tissue marker clip was
deployed into the biopsy cavity. Follow-up 2-view mammogram was
performed and dictated separately.
IMPRESSION: Stereotactic-guided biopsy of left breast calcifications. No
apparent complications.

ADDENDUM:
PATHOLOGY revealed: A. LEFT BREAST, CALCIFICATIONS, STEREOTACTIC
NEEDLE CORE BIOPSY: - DUCTAL CARCINOMA IN SITU, HIGH GRADE, WITH
COMEDONECROSIS AND ASSOCIATED CALCIFICATIONS. Comment: DCIS involves
7 of 7 tissue blocks, with a greatest linear extent of 12 mm. ER and
PR are deferred to an excision specimen, but stains can be performed
on block A2 if necessary.

Pathology results are CONCORDANT with imaging findings, per Dr.
Edgar Guillermo Oidor.

Pathology results were discussed with patient via telephone. The
patient reported doing well after the biopsy with no adverse
symptoms, only tenderness at the site. Post biopsy care instructions
were reviewed and questions were answered. The patient was
encouraged to call [HOSPITAL] for any additional
questions or concerns.

Recommendation: Surgical referral. Request for surgical referral was
relayed to nurse navigators at [HOSPITAL] [HOSPITAL] by
Orrin Benoit RN on 11/04/2019.

Addendum by Orrin Benoit RN on 11/04/2019.



Using sterile technique and 1% Lidocaine as local anesthetic, under
stereotactic guidance, a 9 gauge vacuum assisted device was used to
perform core needle biopsy of calcifications in the lower inner left
breast using a medial approach. Specimen radiograph was performed
showing 2 of 12 specimens with calcifications. Specimens with
calcifications are identified for pathology.

Lesion quadrant: Lower inner quadrant

At the conclusion of the procedure, an X tissue marker clip was
deployed into the biopsy cavity. Follow-up 2-view mammogram was
performed and dictated separately.
IMPRESSION: Stereotactic-guided biopsy of left breast calcifications. No
apparent complications.

## 2020-06-16 IMAGING — MG MM BREAST LOCALIZATION CLIP
4 series · 4 of 12 positions shown · non-contrast
Comparison: Previous exam(s).

CLINICAL DATA: Patient presents for biopsy of left breast
calcifications.

EXAM:
DIAGNOSTIC LEFT MAMMOGRAM POST STEREOTACTIC BIOPSY

[L ML synth-2D]
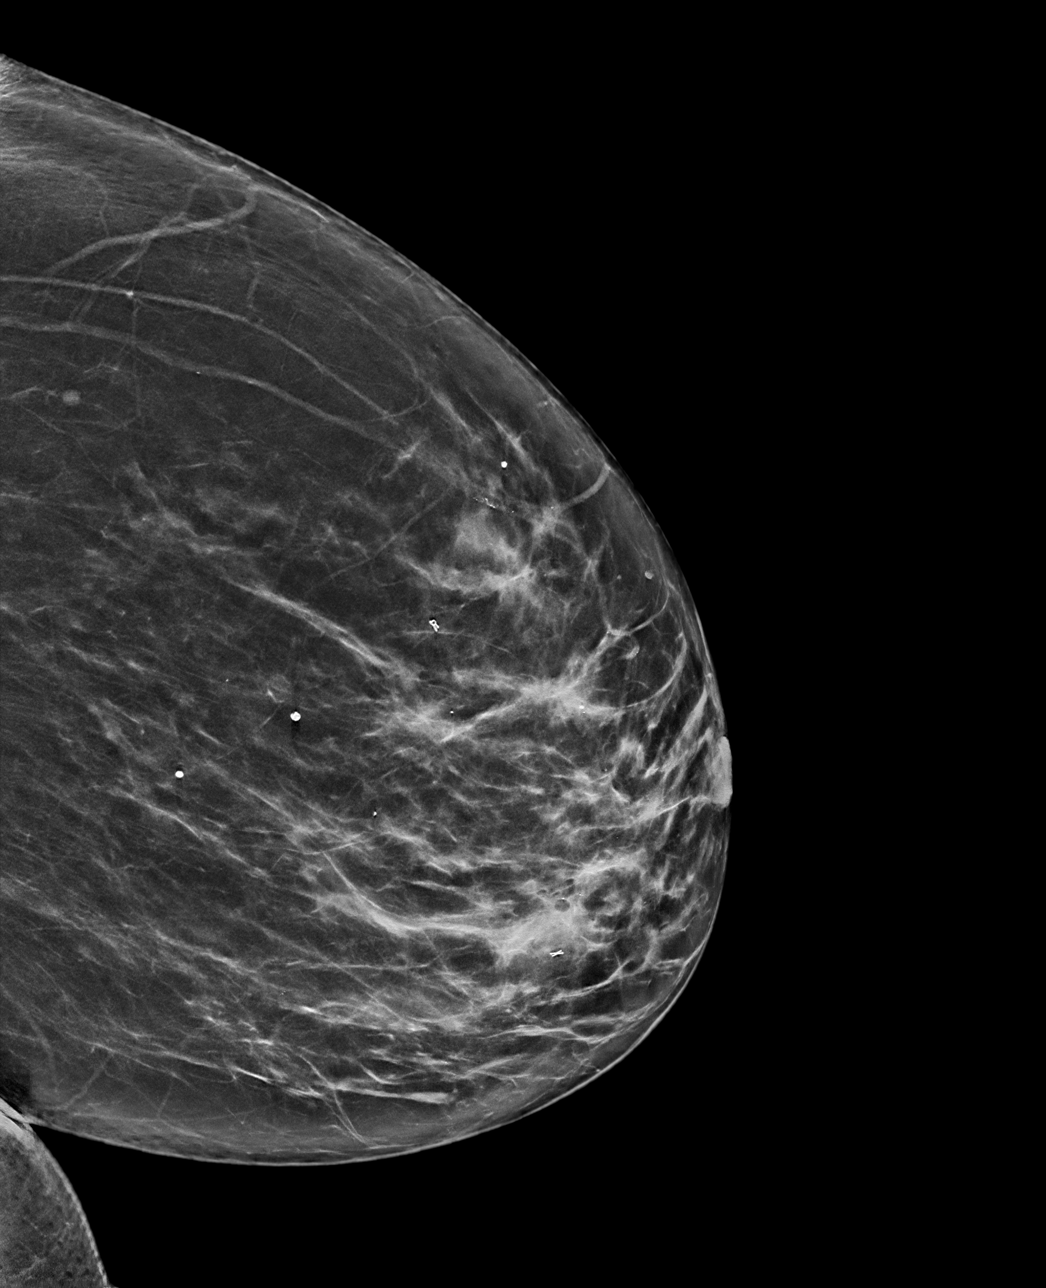

[L CC synth-2D]
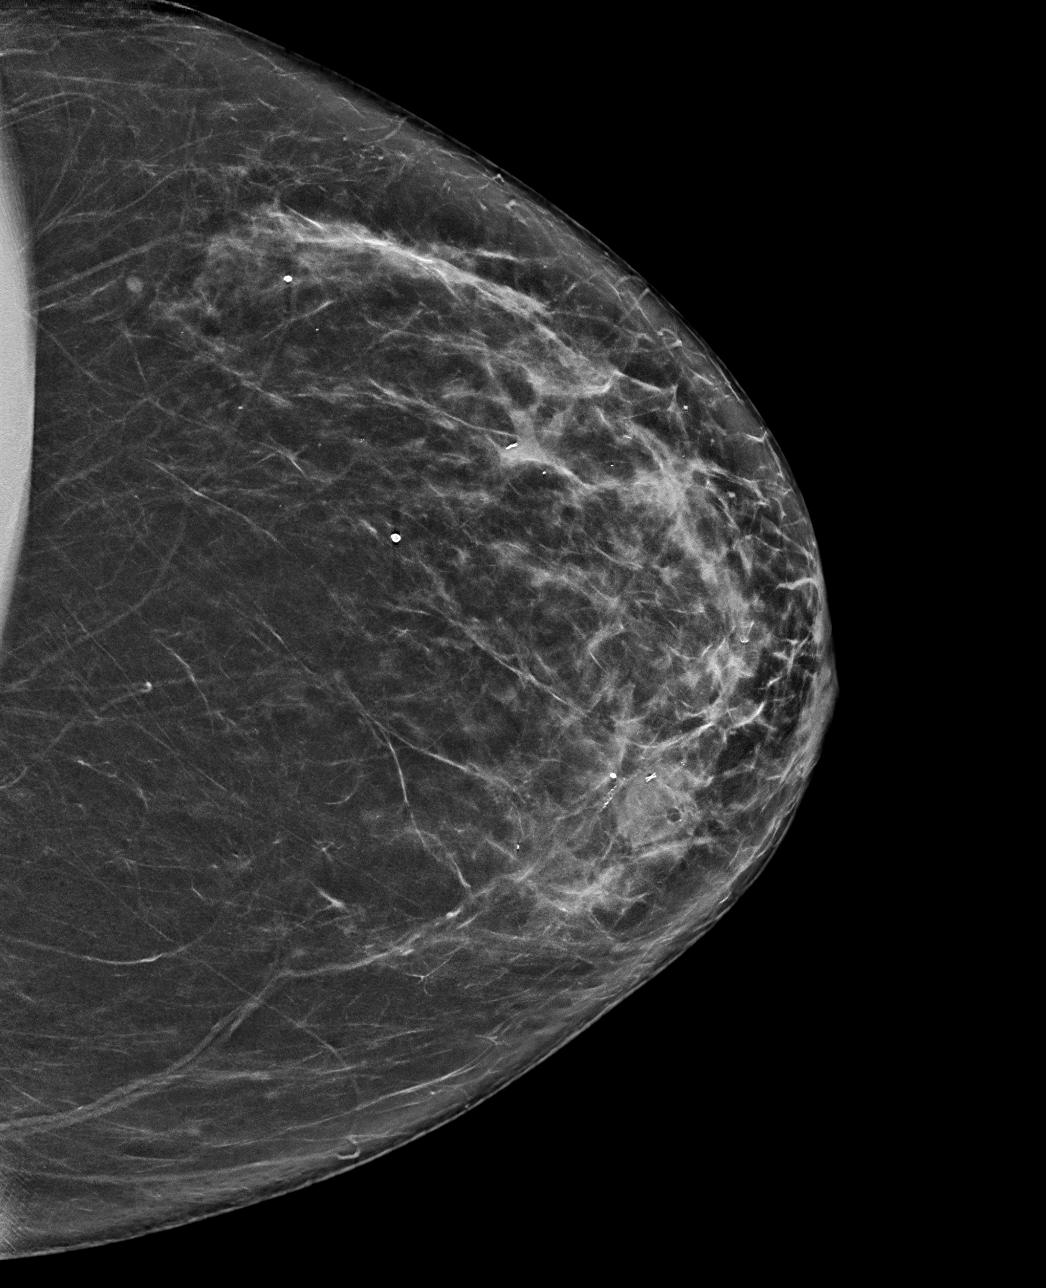

[L CC tomo · tomo slice 40/79.0]
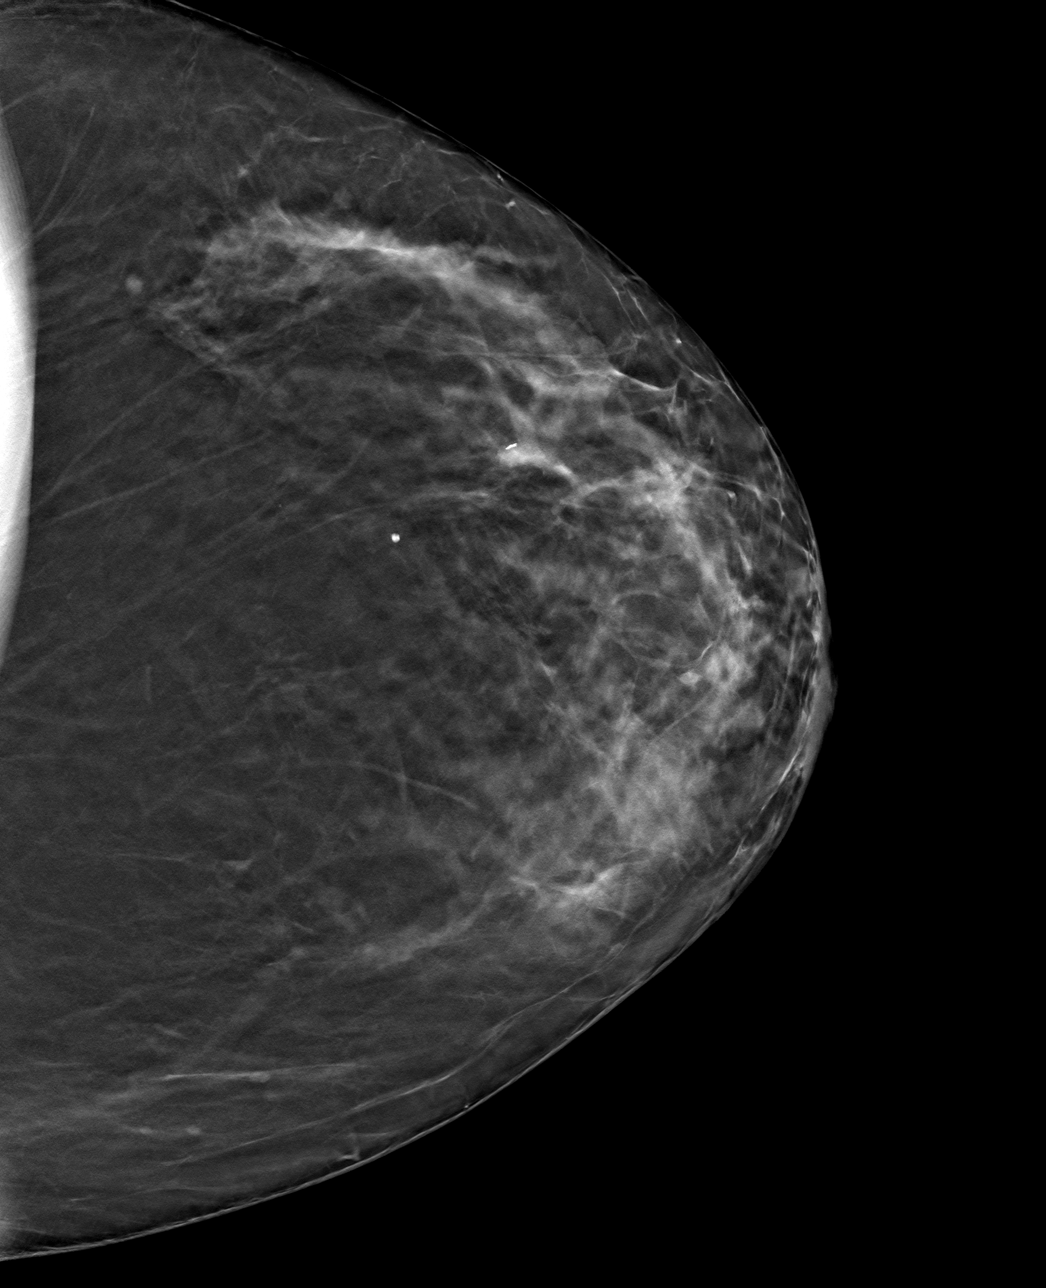

[L ML tomo · tomo slice 41/80.0]
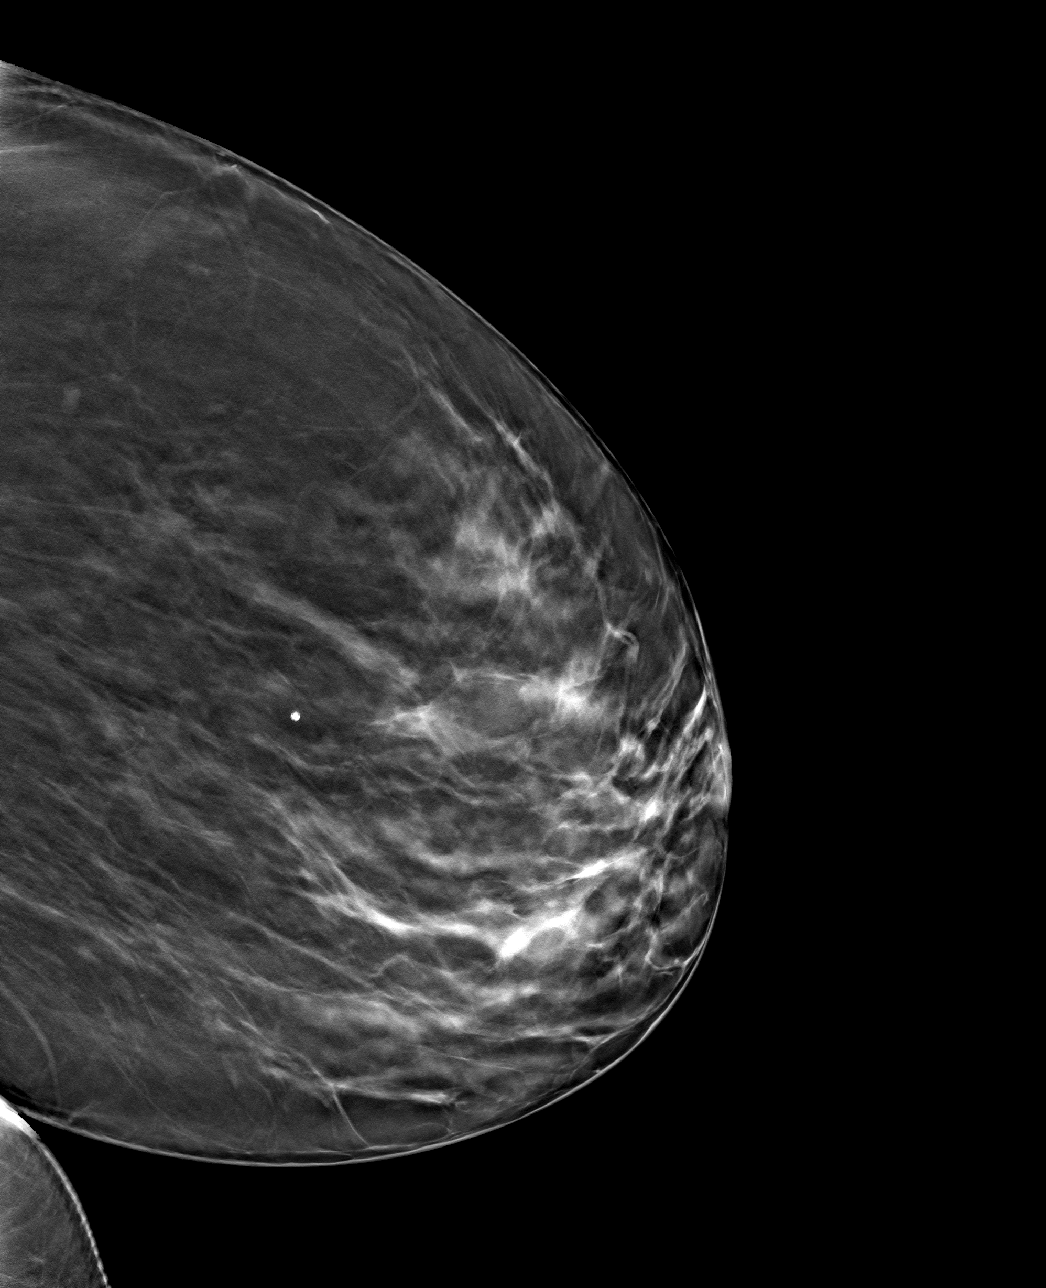

[4 of 12 positions shown; findings below may reference images not displayed]

FINDINGS: Mammographic images were obtained following stereotactic guided
biopsy of calcifications in the lower inner left breast. The biopsy
marking clip is in expected position at the site of biopsy.
IMPRESSION: Appropriate positioning of the X shaped biopsy marking clip at the
site of biopsy in the lower inner left breast.

Final Assessment: Post Procedure Mammograms for Marker Placement

## 2020-06-19 NOTE — Progress Notes (Signed)
Subjective:   Erin Romero is a 69 y.o. female who presents for Medicare Annual (Subsequent) preventive examination.  I connected with Erin Romero today by telephone and verified that I am speaking with the correct person using two identifiers. Location patient: home Location provider: work Persons participating in the virtual visit: patient, provider.   I discussed the limitations, risks, security and privacy concerns of performing an evaluation and management service by telephone and the availability of in person appointments. I also discussed with the patient that there may be a patient responsible charge related to this service. The patient expressed understanding and verbally consented to this telephonic visit.    Interactive audio and video telecommunications were attempted between this provider and patient, however failed, due to patient having technical difficulties OR patient did not have access to video capability.  We continued and completed visit with audio only.  Review of Systems    N/A  Cardiac Risk Factors include: advanced age (>76mn, >>66women);hypertension;dyslipidemia;obesity (BMI >30kg/m2)     Objective:    There were no vitals filed for this visit. There is no height or weight on file to calculate BMI.  Advanced Directives 06/20/2020 01/24/2020 11/30/2019 11/23/2019 11/07/2019 06/20/2019 06/15/2018  Does Patient Have a Medical Advance Directive? Yes No No Yes No Yes Yes  Type of [place]amed[place] will Living will - Living will - [place]  Copy of HH[place] Chart? Yes - validated most recent copy scanned in chart (See row information) - - - - Yes - validated most recent copy scanned in chart (See row information) Yes  Would patient like information on creating a medical advance directive? - No - Patient declined No - Patient  declined - - - -    Current Medications (verified) Outpatient Encounter Medications as of 06/20/2020  Medication Sig  . acetaminophen (TYLENOL) 500 MG tablet Take 1,500 mg by mouth every 4 (four) hours as needed for moderate pain.   .Marland KitchenamLODipine (NORVASC) 2.5 MG tablet TAKE 1 TABLET EVERY DAY  . atorvastatin (LIPITOR) 10 MG tablet TAKE 1 TABLET EVERY DAY  . bisoprolol-hydrochlorothiazide (ZIAC) 10-6.25 MG tablet Take 1 tablet by mouth daily.  . Calcium Carb-Cholecalciferol (OYSTER SHELL CALCIUM + D PO) Take 1 tablet by mouth daily. 6036mcalcium plus 20 mg Vit D  . Cholecalciferol (VITAMIN D-3) 25 MCG (1000 UT) CAPS Take 1,000 Units by mouth daily.   . Marland Kitchenscitalopram (LEXAPRO) 10 MG tablet TAKE 1 TABLET EVERY DAY  . hydrochlorothiazide (HYDRODIURIL) 12.5 MG tablet TAKE 1 TABLET EVERY DAY  . lisinopril (ZESTRIL) 20 MG tablet TAKE 1 TABLET EVERY DAY (Patient taking differently: Take 20 mg by mouth daily. )  . omeprazole (PRILOSEC) 20 MG capsule TAKE 1 CAPSULE EVERY DAY  . potassium chloride SA (KLOR-CON) 20 MEQ tablet TAKE 1 TABLET EVERY DAY (Patient taking differently: Take 20 mEq by mouth daily. )  . traZODone (DESYREL) 50 MG tablet TAKE 1 TABLET EVERY DAY  . warfarin (COUMADIN) 2 MG tablet Take 2 mg by mouth as directed. 1.5 mg x 5 days and 59m60m 2 days  . aspirin 81 MG chewable tablet Chew 81 mg by mouth daily. (Patient not taking: Reported on 06/20/2020)  . letrozole (FEMARA) 2.5 MG tablet TAKE 1 TABLET EVERY DAY (Patient not taking: Reported on 06/20/2020)   No facility-administered encounter medications on file as of 06/20/2020.    Allergies (verified) Taxotere [  docetaxel] and Sulfa antibiotics   History: Past Medical History:  Diagnosis Date  . Anxiety   . Atrial fibrillation ([place]) 2014  . Breast cancer ([place]) 2014   right breast  . Dysrhythmia   . Fibrocystic breast disease 2005  . GERD (gastroesophageal reflux disease)   . Hypertension 2005  . Lump or mass in breast 2014    right mastectomy  . Malignant neoplasm of central portion of female breast ([place]) 01/24/2013   Stage II,T2, N1a. ER/PR 90%, HER-2/neu: Not overexpressed. Mastectomy with sentinel node biopsy. 2 sentinel nodes negative, non-sentinel node w/ 5 mm macrometastatic foci in non-sentinel node.  Received post mastectomy radiation.    Past Surgical History:  Procedure Laterality Date  . ABDOMINAL HYSTERECTOMY  1999  . BREAST BIOPSY Left 2015   BENIGN FIBROTIC BREAST TISSUE WITH CLUSTERED APOCRINE  . BREAST BIOPSY Left 11/02/2019   affirm bx, x marker, path pending  . BREAST SURGERY Right 2005,2014   biopsy  . BREAST SURGERY Right 2014   mastectomy  . COLONOSCOPY  2002  . COLONOSCOPY WITH PROPOFOL N/A 10/16/2015   Procedure: COLONOSCOPY WITH PROPOFOL;  Surgeon: Robert Bellow, MD;  [facility] END[facility]COPY;  Service: Endoscopy;  Laterality: N/A;  . KNEE SURGERY Left 2005  . MASTECTOMY Right 2014   radiation chemo  . SIMPLE MASTECTOMY WITH AXILLARY SENTINEL NODE BIOPSY Left 11/30/2019   Procedure: SIMPLE MASTECTOMY WITH AXILLARY SENTINEL NODE BIOPSY;  Surgeon: Robert Bellow, MD;  Location: [facility] ORS;  Service: General;  Laterality: Left;  . thumb surg Right 2004  . TONSILLECTOMY     age of 89   Family History  Problem Relation Age of Onset  . Heart disease Father   . Hypertension Sister   . Cancer Mother        cervical  . Cancer Other        ovarian,liver,colon cancer-no family member listed  . Osteoporosis Paternal Aunt   . Osteoporosis Paternal Grandmother    Social History   Socioeconomic History  . Marital status: Widowed    Spouse name: Alesia Oshields  . Number of children: 0  . Years of education: 13  . Highest education level: 12th grade  Occupational History  . Occupation: retired  Tobacco Use  . Smoking status: Never Smoker  . Smokeless tobacco: Never Used  Vaping Use  . Vaping Use: Never used  Substance and Sexual Activity  . Alcohol use: No  . Drug use: No    . Sexual activity: Not on file  Other Topics Concern  . Not on file  Social History Narrative  . Not on file   Social Determinants of Health   Financial Resource Strain: Low Risk   . Difficulty of Paying Living Expenses: Not hard at all  Food Insecurity: No Food Insecurity  . Worried About Charity fundraiser in the Last Year: Never true  . Ran Out of Food in the Last Year: Never true  Transportation Needs: No Transportation Needs  . Lack of Transportation (Medical): No  . Lack of Transportation (Non-Medical): No  Physical Activity: Inactive  . Days of Exercise per Week: 0 days  . Minutes of Exercise per Session: 0 min  Stress: No Stress Concern Present  . Feeling of Stress : Not at all  Social Connections: Socially Isolated  . Frequency of Communication with Friends and Family: Twice a week  . Frequency of Social Gatherings with Friends and Family: Never  . Attends Religious Services: Never  .  Active Member of Clubs or Organizations: No  . Attends Archivist Meetings: Never  . Marital Status: Widowed    Tobacco Counseling Counseling given: Not Answered   Clinical Intake:  Pre-visit preparation completed: Yes  Pain : No/denies pain     Nutritional Risks: None Diabetes: No  How often do you need to have someone help you when you read instructions, pamphlets, or other written materials from your doctor or pharmacy?: 1 - Never  Diabetic? No  Interpreter Needed?: No  Information entered by :: [facility], LPN   Activities of Daily Living In your present state of health, do you have any difficulty performing the following activities: 06/20/2020 11/30/2019  Hearing? N N  Vision? Y Y  Comment Has early onset of cataracts in both eyes. -  Difficulty concentrating or making decisions? N N  Walking or climbing stairs? Y Y  Comment Due to knee pains and SOB from a-fib. -  Dressing or bathing? N N  Doing errands, shopping? N -  Preparing Food and eating ? N  -  Using the Toilet? N -  In the past six months, have you accidently leaked urine? N -  Do you have problems with loss of bowel control? N -  Managing your Medications? N -  Managing your Finances? N -  Housekeeping or managing your Housekeeping? N -  Some recent data might be hidden    Patient Care Team: Paulene Floor as PCP - General (Physician Assistant) Bary Castilla Forest Gleason, MD as Consulting Physician (General Surgery) Teodoro Spray, MD as Consulting Physician (Cardiology) Earlie Server, MD as Consulting Physician (Oncology) Cleaster Corin, OD (Optometry)  Indicate any recent Medical Services you may have received from other than [facility] providers in the past year (date may be approximate).     Assessment:   This is a routine wellness examination for [place].  Hearing/Vision screen No exam data present  Dietary issues and exercise activities discussed: Current Exercise Habits: The patient does not participate in regular exercise at present, Exercise limited by: orthopedic condition(s)  Goals    . DIET - REDUCE SUGAR INTAKE     Recommend cutting out sugar and ice cream from daily diet and substituting for sugar free snacks.     . Exercise 150 minutes per week (moderate activity)    . Increase water intake     Recommend increasing water intake to 4 glasses of water a day.       Depression Screen PHQ 2/9 Scores 06/20/2020 02/09/2020 06/20/2019 06/15/2018 06/25/2017 05/21/2017 11/05/2016  PHQ - 2 Score 1 0 0 1 0 1 0  PHQ- 9 Score - 1 - - 1 5 -    Fall Risk Fall Risk  06/20/2020 06/20/2019 06/15/2018 06/25/2017 11/05/2016  Falls in the past year? 0 0 No No No  Number falls in past yr: 0 - - - -  Injury with Fall? 0 - - - -    Any stairs in or around the home? Yes  If so, are there any without handrails? No  Home free of loose throw rugs in walkways, pet beds, electrical cords, etc? Yes  Adequate lighting in your home to reduce risk of falls? Yes   ASSISTIVE DEVICES  UTILIZED TO PREVENT FALLS:  Life alert? No  Use of a cane, walker or w/c? No  Grab bars in the bathroom? Yes  Shower chair or bench in shower? Yes  Elevated toilet seat or a handicapped toilet? No  Cognitive Function: Declined today.     6CIT Screen 06/20/2019 06/15/2018 06/25/2017  What Year? 0 points 0 points 0 points  What month? 0 points 0 points 0 points  What time? 0 points 0 points 0 points  Count back from 20 0 points 0 points 0 points  Months in reverse 0 points 0 points 0 points  Repeat phrase 0 points 0 points 4 points  Total Score 0 0 4    Immunizations Immunization History  Administered Date(s) Administered  . Fluad Quad(high Dose 65+) 09/19/2019  . Influenza, High Dose Seasonal PF 09/14/2018  . Influenza,inj,Quad PF,6+ Mos 09/06/2015, 09/21/2017  . Pneumococcal Conjugate-13 08/22/2015  . Pneumococcal Polysaccharide-23 09/03/2016, 09/21/2017    TDAP status: Due, Education has been provided regarding the importance of this vaccine. Advised may receive this vaccine at local pharmacy or Health Dept. Aware to provide a copy of the vaccination record if obtained from local pharmacy or Health Dept. Verbalized acceptance and understanding. Flu Vaccine status: Up to date Pneumococcal vaccine status: Up to date Covid-19 vaccine status: Completed vaccines  Qualifies for Shingles Vaccine? Yes   Zostavax completed No   Shingrix Completed?: No.    Education has been provided regarding the importance of this vaccine. Patient has been advised to call insurance company to determine out of pocket expense if they have not yet received this vaccine. Advised may also receive vaccine at local pharmacy or Health Dept. Verbalized acceptance and understanding.  Screening Tests Health Maintenance  Topic Date Due  . COVID-19 Vaccine (1) Never done  . TETANUS/TDAP  12/22/2026 (Originally 03/23/1970)  . INFLUENZA VACCINE  07/22/2020  . DEXA SCAN  09/08/2020  . MAMMOGRAM  10/25/2021  .  COLONOSCOPY  10/15/2025  . Hepatitis C Screening  Completed  . PNA vac Low Risk Adult  Completed    Health Maintenance  Health Maintenance Due  Topic Date Due  . COVID-19 Vaccine (1) Never done    Colorectal cancer screening: Completed 10/16/15. Repeat every 10 years Mammogram status: Completed 11/02/19. Repeat every year Bone Density status: Completed 09/08/18. Results reflect: Bone density results: OSTEOPOROSIS. Repeat every 2 years.  Lung Cancer Screening: (Low Dose CT Chest recommended if Age 54-80 years, 30 pack-year currently smoking OR have quit w/in 15years.) does not qualify.   Additional Screening:  Hepatitis C Screening: Up to date  Vision Screening: Recommended annual ophthalmology exams for early detection of glaucoma and other disorders of the eye. Is the patient up to date with their annual eye exam?  Yes  Who is the provider or what is the name of the office in which the patient attends annual eye exams? Dr Algis Liming If pt is not established with a provider, would they like to be referred to a provider to establish care? No .   Dental Screening: Recommended annual dental exams for proper oral hygiene  Community Resource Referral / Chronic Care Management: CRR required this visit?  No   CCM required this visit?  No      Plan:     I have personally reviewed and noted the following in the patient's chart:   . Medical and social history . Use of alcohol, tobacco or illicit drugs  . Current medications and supplements . Functional ability and status . Nutritional status . Physical activity . Advanced directives . List of other physicians . Hospitalizations, surgeries, and ER visits in previous 12 months . Vitals . Screenings to include cognitive, depression, and falls . Referrals and appointments  In  addition, I have reviewed and discussed with patient certain preventive protocols, quality metrics, and best practice recommendations. A written  personalized care plan for preventive services as well as general preventive health recommendations were provided to patient.     Darlys Buis [place], [place]   [id number]   Nurse Notes: Recommend to bring in a copy of the COVID vaccine card.

## 2020-06-20 ENCOUNTER — Other Ambulatory Visit: Payer: Self-pay

## 2020-06-20 ENCOUNTER — Ambulatory Visit (INDEPENDENT_AMBULATORY_CARE_PROVIDER_SITE_OTHER): Payer: Medicare HMO

## 2020-06-20 DIAGNOSIS — Z0000 Encounter for general adult medical examination without abnormal findings: Secondary | ICD-10-CM | POA: Diagnosis not present

## 2020-06-20 NOTE — Patient Instructions (Signed)
[name] , Thank you for taking time to come for your Medicare Wellness Visit. I appreciate your ongoing commitment to your health goals. Please review the following plan we discussed and let me know if I can assist you in the future.   Screening recommendations/referrals: Colonoscopy: Up to date, due 09/2025 Mammogram: Up to date, due 10/2020 Bone Density: Up to date, due 08/2020 Recommended yearly ophthalmology/optometry visit for glaucoma screening and checkup Recommended yearly dental visit for hygiene and checkup  Vaccinations: Influenza vaccine: Done 09/19/19 Pneumococcal vaccine: Completed series Tdap vaccine: Currently due, declined today.  Shingles vaccine: Currently due, declined today.    Advanced directives: Currently on file.  Conditions/risks identified: Recommend to increase water intake to 6-8 8 oz glasses a day. Continue to cut back on sugars in diet and eat more low sugar snacks.  Next appointment: 08/08/20 @ 11:00 AM with Carles Collet.    Preventive Care 70 Years and Older, Female Preventive care refers to lifestyle choices and visits with your health care provider that can promote health and wellness. What does preventive care include?  A yearly physical exam. This is also called an annual well check.  Dental exams once or twice a year.  Routine eye exams. Ask your health care provider how often you should have your eyes checked.  Personal lifestyle choices, including:  Daily care of your teeth and gums.  Regular physical activity.  Eating a healthy diet.  Avoiding tobacco and drug use.  Limiting alcohol use.  Practicing safe sex.  Taking low-dose aspirin every day.  Taking vitamin and mineral supplements as recommended by your health care provider. What happens during an annual well check? The services and screenings done by your health care provider during your annual well check will depend on your age, overall health, lifestyle risk factors,  and family history of disease. Counseling  Your health care provider may ask you questions about your:  Alcohol use.  Tobacco use.  Drug use.  Emotional well-being.  Home and relationship well-being.  Sexual activity.  Eating habits.  History of falls.  Memory and ability to understand (cognition).  Work and work Statistician.  Reproductive health. Screening  You may have the following tests or measurements:  Height, weight, and BMI.  Blood pressure.  Lipid and cholesterol levels. These may be checked every 5 years, or more frequently if you are over 17 years old.  Skin check.  Lung cancer screening. You may have this screening every year starting at age 61 if you have a 30-pack-year history of smoking and currently smoke or have quit within the past 15 years.  Fecal occult blood test (FOBT) of the stool. You may have this test every year starting at age 18.  Flexible sigmoidoscopy or colonoscopy. You may have a sigmoidoscopy every 5 years or a colonoscopy every 10 years starting at age 86.  Hepatitis C blood test.  Hepatitis B blood test.  Sexually transmitted disease (STD) testing.  Diabetes screening. This is done by checking your blood sugar (glucose) after you have not eaten for a while (fasting). You may have this done every 1-3 years.  Bone density scan. This is done to screen for osteoporosis. You may have this done starting at age 31.  Mammogram. This may be done every 1-2 years. Talk to your health care provider about how often you should have regular mammograms. Talk with your health care provider about your test results, treatment options, and if necessary, the need for more tests.  Vaccines  Your health care provider may recommend certain vaccines, such as:  Influenza vaccine. This is recommended every year.  Tetanus, diphtheria, and acellular pertussis (Tdap, Td) vaccine. You may need a Td booster every 10 years.  Zoster vaccine. You may need  this after age 41.  Pneumococcal 13-valent conjugate (PCV13) vaccine. One dose is recommended after age 58.  Pneumococcal polysaccharide (PPSV23) vaccine. One dose is recommended after age 51. Talk to your health care provider about which screenings and vaccines you need and how often you need them. This information is not intended to replace advice given to you by your health care provider. Make sure you discuss any questions you have with your health care provider. Document Released: 01/04/2016 Document Revised: 08/27/2016 Document Reviewed: 10/09/2015 Elsevier Interactive Patient Education  2017 [employer] Prevention in the Home Falls can cause injuries. They can happen to people of all ages. There are many things you can do to make your home safe and to help prevent falls. What can I do on the outside of my home?  Regularly fix the edges of walkways and driveways and fix any cracks.  Remove anything that might make you trip as you walk through a door, such as a raised step or threshold.  Trim any bushes or trees on the path to your home.  Use bright outdoor lighting.  Clear any walking paths of anything that might make someone trip, such as rocks or tools.  Regularly check to see if handrails are loose or broken. Make sure that both sides of any steps have handrails.  Any raised decks and porches should have guardrails on the edges.  Have any leaves, snow, or ice cleared regularly.  Use sand or salt on walking paths during winter.  Clean up any spills in your garage right away. This includes oil or grease spills. What can I do in the bathroom?  Use night lights.  Install grab bars by the toilet and in the tub and shower. Do not use towel bars as grab bars.  Use non-skid mats or decals in the tub or shower.  If you need to sit down in the shower, use a plastic, non-slip stool.  Keep the floor dry. Clean up any water that spills on the floor as soon as it  happens.  Remove soap buildup in the tub or shower regularly.  Attach bath mats securely with double-sided non-slip rug tape.  Do not have throw rugs and other things on the floor that can make you trip. What can I do in the bedroom?  Use night lights.  Make sure that you have a light by your bed that is easy to reach.  Do not use any sheets or blankets that are too big for your bed. They should not hang down onto the floor.  Have a firm chair that has side arms. You can use this for support while you get dressed.  Do not have throw rugs and other things on the floor that can make you trip. What can I do in the kitchen?  Clean up any spills right away.  Avoid walking on wet floors.  Keep items that you use a lot in easy-to-reach places.  If you need to reach something above you, use a strong step stool that has a grab bar.  Keep electrical cords out of the way.  Do not use floor polish or wax that makes floors slippery. If you must use wax, use non-skid floor wax.  Do not have throw rugs and other things on the floor that can make you trip. What can I do with my stairs?  Do not leave any items on the stairs.  Make sure that there are handrails on both sides of the stairs and use them. Fix handrails that are broken or loose. Make sure that handrails are as long as the stairways.  Check any carpeting to make sure that it is firmly attached to the stairs. Fix any carpet that is loose or worn.  Avoid having throw rugs at the top or bottom of the stairs. If you do have throw rugs, attach them to the floor with carpet tape.  Make sure that you have a light switch at the top of the stairs and the bottom of the stairs. If you do not have them, ask someone to add them for you. What else can I do to help prevent falls?  Wear shoes that:  Do not have high heels.  Have rubber bottoms.  Are comfortable and fit you well.  Are closed at the toe. Do not wear sandals.  If you  use a stepladder:  Make sure that it is fully opened. Do not climb a closed stepladder.  Make sure that both sides of the stepladder are locked into place.  Ask someone to hold it for you, if possible.  Clearly mark and make sure that you can see:  Any grab bars or handrails.  First and last steps.  Where the edge of each step is.  Use tools that help you move around (mobility aids) if they are needed. These include:  Canes.  Walkers.  Scooters.  Crutches.  Turn on the lights when you go into a dark area. Replace any light bulbs as soon as they burn out.  Set up your furniture so you have a clear path. Avoid moving your furniture around.  If any of your floors are uneven, fix them.  If there are any pets around you, be aware of where they are.  Review your medicines with your doctor. Some medicines can make you feel dizzy. This can increase your chance of falling. Ask your doctor what other things that you can do to help prevent falls. This information is not intended to replace advice given to you by your health care provider. Make sure you discuss any questions you have with your health care provider. Document Released: 10/04/2009 Document Revised: 05/15/2016 Document Reviewed: 01/12/2015 Elsevier Interactive Patient Education  2017 [employer].

## 2020-06-25 ENCOUNTER — Other Ambulatory Visit: Payer: Self-pay | Admitting: Physician Assistant

## 2020-06-25 DIAGNOSIS — E7800 Pure hypercholesterolemia, unspecified: Secondary | ICD-10-CM

## 2020-06-25 DIAGNOSIS — F419 Anxiety disorder, unspecified: Secondary | ICD-10-CM

## 2020-06-27 DIAGNOSIS — I4891 Unspecified atrial fibrillation: Secondary | ICD-10-CM | POA: Diagnosis not present

## 2020-07-13 ENCOUNTER — Other Ambulatory Visit: Payer: Self-pay | Admitting: Oncology

## 2020-07-24 ENCOUNTER — Other Ambulatory Visit: Payer: Self-pay

## 2020-07-24 ENCOUNTER — Inpatient Hospital Stay: Payer: Medicare HMO | Attending: Oncology

## 2020-07-24 ENCOUNTER — Inpatient Hospital Stay: Payer: Medicare HMO | Admitting: Oncology

## 2020-07-24 ENCOUNTER — Encounter: Payer: Self-pay | Admitting: Oncology

## 2020-07-24 VITALS — BP 144/75 | HR 76 | Temp 97.9°F | Resp 20 | Wt 225.5 lb

## 2020-07-24 DIAGNOSIS — D0512 Intraductal carcinoma in situ of left breast: Secondary | ICD-10-CM

## 2020-07-24 DIAGNOSIS — Z809 Family history of malignant neoplasm, unspecified: Secondary | ICD-10-CM

## 2020-07-24 DIAGNOSIS — M816 Localized osteoporosis [Lequesne]: Secondary | ICD-10-CM | POA: Diagnosis not present

## 2020-07-24 DIAGNOSIS — D500 Iron deficiency anemia secondary to blood loss (chronic): Secondary | ICD-10-CM

## 2020-07-24 DIAGNOSIS — Z853 Personal history of malignant neoplasm of breast: Secondary | ICD-10-CM | POA: Diagnosis not present

## 2020-07-24 DIAGNOSIS — Z9013 Acquired absence of bilateral breasts and nipples: Secondary | ICD-10-CM | POA: Insufficient documentation

## 2020-07-24 LAB — COMPREHENSIVE METABOLIC PANEL
ALT: 13 U/L (ref 0–44)
AST: 18 U/L (ref 15–41)
Albumin: 3.4 g/dL — ABNORMAL LOW (ref 3.5–5.0)
Alkaline Phosphatase: 73 U/L (ref 38–126)
Anion gap: 8 (ref 5–15)
BUN: 15 mg/dL (ref 8–23)
CO2: 29 mmol/L (ref 22–32)
Calcium: 8.7 mg/dL — ABNORMAL LOW (ref 8.9–10.3)
Chloride: 103 mmol/L (ref 98–111)
Creatinine, Ser: 0.91 mg/dL (ref 0.44–1.00)
GFR calc Af Amer: >60 mL/min (ref >60)
GFR calc non Af Amer: >60 mL/min (ref >60)
Glucose, Bld: 154 mg/dL — ABNORMAL HIGH (ref 70–99)
Potassium: 3.6 mmol/L (ref 3.5–5.1)
Sodium: 140 mmol/L (ref 135–145)
Total Bilirubin: 0.6 mg/dL (ref 0.3–1.2)
Total Protein: 7.4 g/dL (ref 6.5–8.1)

## 2020-07-24 LAB — CBC WITH DIFFERENTIAL/PLATELET
Abs Immature Granulocytes: 0.03 10*3/uL (ref 0.00–0.07)
Basophils Absolute: 0.1 10*3/uL (ref 0.0–0.1)
Basophils Relative: 1 %
Eosinophils Absolute: 0.1 10*3/uL (ref 0.0–0.5)
Eosinophils Relative: 1 %
HCT: 33.8 % — ABNORMAL LOW (ref 36.0–46.0)
Hemoglobin: 10.6 g/dL — ABNORMAL LOW (ref 12.0–15.0)
Immature Granulocytes: 0 %
Lymphocytes Relative: 18 %
Lymphs Abs: 1.7 10*3/uL (ref 0.7–4.0)
MCH: 24.8 pg — ABNORMAL LOW (ref 26.0–34.0)
MCHC: 31.4 g/dL (ref 30.0–36.0)
MCV: 79.2 fL — ABNORMAL LOW (ref 80.0–100.0)
Monocytes Absolute: 0.7 10*3/uL (ref 0.1–1.0)
Monocytes Relative: 7 %
Neutro Abs: 6.6 10*3/uL (ref 1.7–7.7)
Neutrophils Relative %: 73 %
Platelets: 225 10*3/uL (ref 150–400)
RBC: 4.27 MIL/uL (ref 3.87–5.11)
RDW: 17 % — ABNORMAL HIGH (ref 11.5–15.5)
WBC: 9.2 10*3/uL (ref 4.0–10.5)
nRBC: 0 % (ref 0.0–0.2)

## 2020-07-24 LAB — IRON AND TIBC
Iron: 37 ug/dL (ref 28–170)
Saturation Ratios: 9 % — ABNORMAL LOW (ref 10.4–31.8)
TIBC: 403 ug/dL (ref 250–450)
UIBC: 366 ug/dL

## 2020-07-24 LAB — RETIC PANEL
Immature Retic Fract: 21.3 % — ABNORMAL HIGH (ref 2.3–15.9)
RBC.: 4.24 MIL/uL (ref 3.87–5.11)
Retic Count, Absolute: 93.3 10*3/uL (ref 19.0–186.0)
Retic Ct Pct: 2.2 % (ref 0.4–3.1)
Reticulocyte Hemoglobin: 26.9 pg — ABNORMAL LOW (ref >27.9)

## 2020-07-24 MED ORDER — FERROUS GLUCONATE 324 (38 FE) MG PO TABS
324.0000 mg | ORAL_TABLET | Freq: Two times a day (BID) | ORAL | 0 refills | Status: DC
Start: 2020-07-24 — End: 2020-09-24

## 2020-07-24 NOTE — Progress Notes (Signed)
[facility] day:  07/24/20   Chief Complaint: Erin Romero is a 69 y.o. female with stage IIB right breast cancer who is here for follow up.   PERTINENT ONCOLOGY HISTORY Patient previously followed up with Dr. Mike Gip.  Switch care to me on 08/16/2019. Extensive medical records were reviewed. history of stage IIB (T2N1M0) right breast cancer status post radical mastectomy on 2/3/2 medical 014.  Pathology revealed a 3 cm grade 2 invasive mammary carcinoma. 0/2 sentinel lymph nodes were positive.  In non-sentinel lymph nodes were positive for 0.5 cm macro metastasis with extracapsular extension.  DCIS was present.  ER >90 percent, PR>90%, HER-2/neu negative. Patient underwent 3 cycles of adjuvant chemotherapy with Taxotere and Cytoxan (03/08/2013 - 04/20/2013).  Treatment was complicated with erythromelalgia and an urticarial reaction.  And she did not receive any additional chemotherapy. Adjuvant radiation finished in July 2014.  Patient was started on letrozole in August 2014 and she self discontinued in October 2019. BCI showed low likelihood of benefit from extended endocrine therapy.  13.6% risk of late recurrence years 5-10. Patient has been off Letrozole since 09/2018.   Mammogram on 02/06/2014 was suspicious for left breast mass.  Left breast biopsy on 02/27/2014 was negative for malignancy.  Left-sided mammogram on 10/05/2017 was negative.   Left breast screening mammogram on 10/11/2018 revealed no evidence of malignancy.  #  Osteoporosis,  Bone density study on 06/05/2015 revealed osteopenia with a T score of -1.9 in the left femoral neck.  Bone density on 09/08/2018 revealed osteoporosis with a T-score of -2.5 in the left femoral neck and -2.2 in AP spine L2-L4.  Patient is on calcium and vitamin D.  She was on an oral bisphosphonate x 1 month.  Patient previosly declined Prolia. with T score of -2.5 of left femoral neck and -2.2 AP  spine L2-L4.  Previously declined Prolia.  She reports that she had tried some bone strengthen medication for 1 month last year, and did not tolerate.  She cannot remember the name of the medication.  # 01/30/19 left DCIS ER +, s/p mastectomy   INTERVAL HISTORY Erin Romero is a 69 y.o. female who has above history reviewed by me today presents for follow up visit for management of breast cancer Problems and complaints are listed below: Patient was resumed on letrozole in December 2020.  After discussion with Dr. Bary Castilla, decision was made to stop letrozole given that patient has now had bilateral mastectomy and benefit of prophylaxis from AI is very small  Today patient reports no new complaints.  She has no particular concerns about mastectomy sites. Patient is on Coumadin for atrial fibrillation.   Past Medical History:  Diagnosis Date  . Anxiety   . Atrial fibrillation ([place]) 2014  . Breast cancer ([place]) 2014   right breast  . Dysrhythmia   . Fibrocystic breast disease 2005  . GERD (gastroesophageal reflux disease)   . Hypertension 2005  . Lump or mass in breast 2014   right mastectomy  . Malignant neoplasm of central portion of female breast ([place]) 01/24/2013   Stage II,T2, N1a. ER/PR 90%, HER-2/neu: Not overexpressed. Mastectomy with sentinel node biopsy. 2 sentinel nodes negative, non-sentinel node w/ 5 mm macrometastatic foci in non-sentinel node.  Received post mastectomy radiation.     Past Surgical History:  Procedure Laterality Date  . ABDOMINAL HYSTERECTOMY  1999  . BREAST BIOPSY Left 2015   BENIGN FIBROTIC BREAST TISSUE WITH CLUSTERED  APOCRINE  . BREAST BIOPSY Left 11/02/2019   affirm bx, x marker, path pending  . BREAST SURGERY Right 2005,2014   biopsy  . BREAST SURGERY Right 2014   mastectomy  . COLONOSCOPY  2002  . COLONOSCOPY WITH PROPOFOL N/A 10/16/2015   Procedure: COLONOSCOPY WITH PROPOFOL;  Surgeon: Robert Bellow, MD;  Location: [facility] ENDOSCOPY;   Service: Endoscopy;  Laterality: N/A;  . KNEE SURGERY Left 2005  . MASTECTOMY Right 2014   radiation chemo  . SIMPLE MASTECTOMY WITH AXILLARY SENTINEL NODE BIOPSY Left 11/30/2019   Procedure: SIMPLE MASTECTOMY WITH AXILLARY SENTINEL NODE BIOPSY;  Surgeon: Robert Bellow, MD;  Location: [facility] ORS;  Service: General;  Laterality: Left;  . thumb surg Right 2004  . TONSILLECTOMY     age of 89    Family History  Problem Relation Age of Onset  . Heart disease Father   . Hypertension Sister   . Cancer Mother        cervical  . Cancer Other        ovarian,liver,colon cancer-no family member listed  . Osteoporosis Paternal Aunt   . Osteoporosis Paternal Grandmother     Social History:  reports that she has never smoked. She has never used smokeless tobacco. She reports that she does not drink alcohol and does not use drugs.    Allergies:  Allergies  Allergen Reactions  . Taxotere [Docetaxel] Itching and Rash  . Sulfa Antibiotics Hives    Current Medications: Current Outpatient Medications  Medication Sig Dispense Refill  . acetaminophen (TYLENOL) 500 MG tablet Take 1,500 mg by mouth every 4 (four) hours as needed for moderate pain.     Marland Kitchen amLODipine (NORVASC) 2.5 MG tablet TAKE 1 TABLET EVERY DAY 90 tablet 0  . atorvastatin (LIPITOR) 10 MG tablet TAKE 1 TABLET EVERY DAY 90 tablet 1  . bisoprolol-hydrochlorothiazide (ZIAC) 10-6.25 MG tablet Take 1 tablet by mouth daily. 90 tablet 1  . Calcium Carb-Cholecalciferol (OYSTER SHELL CALCIUM + D PO) Take 1 tablet by mouth daily. 657m calcium plus 20 mg Vit D    . Cholecalciferol (VITAMIN D-3) 25 MCG (1000 UT) CAPS Take 1,000 Units by mouth daily.     .Marland Kitchenescitalopram (LEXAPRO) 10 MG tablet TAKE 1 TABLET EVERY DAY 90 tablet 0  . hydrochlorothiazide (HYDRODIURIL) 12.5 MG tablet TAKE 1 TABLET EVERY DAY 90 tablet 1  . lisinopril (ZESTRIL) 20 MG tablet TAKE 1 TABLET EVERY DAY (Patient taking differently: Take 20 mg by mouth daily. ) 90 tablet 1   . omeprazole (PRILOSEC) 20 MG capsule TAKE 1 CAPSULE EVERY DAY 90 capsule 1  . potassium chloride SA (KLOR-CON) 20 MEQ tablet TAKE 1 TABLET EVERY DAY (Patient taking differently: Take 20 mEq by mouth daily. ) 90 tablet 1  . traZODone (DESYREL) 50 MG tablet TAKE 1 TABLET EVERY DAY 90 tablet 1  . warfarin (COUMADIN) 2 MG tablet Take 2 mg by mouth as directed. 1.5 mg x 5 days and 29mx 2 days    . aspirin 81 MG chewable tablet Chew 81 mg by mouth daily. (Patient not taking: Reported on 07/24/2020)    . ferrous gluconate (FERGON) 324 MG tablet Take 1 tablet (324 mg total) by mouth 2 (two) times daily with a meal. 14 tablet 0  . letrozole (FEMARA) 2.5 MG tablet TAKE 1 TABLET EVERY DAY (Patient not taking: Reported on 07/24/2020) 90 tablet 1   No current facility-administered medications for this visit.   Review of Systems  Constitutional:  Negative for appetite change, chills, fatigue and fever.  HENT:   Negative for hearing loss and voice change.   Eyes: Negative for eye problems.  Respiratory: Negative for chest tightness and cough.   Cardiovascular: Negative for chest pain.  Gastrointestinal: Negative for abdominal distention, abdominal pain and blood in stool.  Endocrine: Negative for hot flashes.  Genitourinary: Negative for difficulty urinating and frequency.   Musculoskeletal: Positive for arthralgias.  Skin: Negative for itching and rash.  Neurological: Negative for extremity weakness.  Hematological: Negative for adenopathy.  Psychiatric/Behavioral: Negative for confusion.    Physical Exam: Blood pressure (!) 144/75, pulse 76, temperature 97.9 F (36.6 C), temperature source Tympanic, resp. rate 20, weight 225 lb 8 oz (102.3 kg), SpO2 99 %. Physical Exam Constitutional:      General: She is not in acute distress.    Appearance: She is not diaphoretic.     Comments: Morbid obese  HENT:     Head: Normocephalic and atraumatic.     Mouth/Throat:     Pharynx: No oropharyngeal  exudate.  Eyes:     General: No scleral icterus.    Pupils: Pupils are equal, round, and reactive to light.  Cardiovascular:     Rate and Rhythm: Normal rate and regular rhythm.     Heart sounds: No murmur heard.   Pulmonary:     Effort: Pulmonary effort is normal. No respiratory distress.  Abdominal:     General: There is no distension.     Palpations: Abdomen is soft.     Tenderness: There is no abdominal tenderness.  Musculoskeletal:        General: Normal range of motion.     Cervical back: Normal range of motion and neck supple.  Skin:    General: Skin is warm and dry.     Findings: No erythema.  Neurological:     Mental Status: She is alert and oriented to person, place, and time.     Cranial Nerves: No cranial nerve deficit.  Psychiatric:        Mood and Affect: Affect normal.   Status post bilateral mastectomy.  No palpable chest wall mass.  No palpable lymphadenopathy bilateral axillary  RADIOGRAPHIC STUDIES: I have personally reviewed the radiological images as listed and agreed with the findings in the report. No results found.   Assessment:  Erin Romero is a 69 y.o. female follows up for breast cancer. 1. Ductal carcinoma in situ (DCIS) of left breast   2. History of breast cancer   3. Localized osteoporosis without current pathological fracture   4. Family history of cancer   5. Iron deficiency anemia due to chronic blood loss    #History of stage IIB (T2N1M0) right breast cancer, ER PR positive HER-2 negative Patient finished approximately 5 years of letrozole and self stopped in 09/2018.  #11/2019  left DCIS, high-grade Currently she is off letrozole given that she has had mastectomy for DCIS of treatment and benefit of aromatase inhibitor is low.  Letrozole has been discontinued.  #Iron deficiency anemia, hemoglobin has dropped to 10.6, microcytic, iron panel was added which showed saturation of 9, Ferritin is pending. Suspect chronic blood loss  from GI tract.  Patient is on anticoagulation for atrial fibrillation. I recommend patient to discuss with primary care provider for gastroenterology work-up.  Last colonoscopy was done in 2016.  #Osteoporosis, continue calcium and vitamin D supplementation. Previously discussed about bisphosphonate or Prolia treatment.  Patient declined.   #Family history of  cancer, personal history of invasive breast cancer.  No newly diagnosed DCIS, I discussed with patient about genetic testing.  Patient declines.  We spent sufficient time to discuss many aspect of care, questions were answered to patient's satisfaction. Total face to face encounter time for this patient visit was 40 min. >50% of the time was  spent in counseling and coordination of care.   She will follow-up 3 months Earlie Server, MD  07/24/2020 , 4:09 PM

## 2020-07-25 ENCOUNTER — Other Ambulatory Visit: Payer: Self-pay | Admitting: Oncology

## 2020-07-25 DIAGNOSIS — I4891 Unspecified atrial fibrillation: Secondary | ICD-10-CM | POA: Diagnosis not present

## 2020-07-25 LAB — FERRITIN: Ferritin: 6 ng/mL — ABNORMAL LOW (ref 11–307)

## 2020-07-25 MED ORDER — FERROUS GLUCONATE 324 (38 FE) MG PO TABS
324.0000 mg | ORAL_TABLET | Freq: Two times a day (BID) | ORAL | 3 refills | Status: DC
Start: 2020-07-25 — End: 2021-06-10

## 2020-07-26 ENCOUNTER — Other Ambulatory Visit: Payer: Self-pay | Admitting: Physician Assistant

## 2020-07-26 DIAGNOSIS — G4700 Insomnia, unspecified: Secondary | ICD-10-CM

## 2020-08-07 NOTE — Progress Notes (Signed)
Complete physical exam   Patient: Erin Romero   DOB: 02-21-1951   69 y.o. Female  MRN: [id number] Visit Date: 08/08/2020  Today's healthcare provider: Trinna Post, PA-C   Chief Complaint  Patient presents with  . Annual Exam   Subjective    Erin Romero is a 69 y.o. female who presents today for a complete physical exam and follow up.  She reports consuming a general diet. The patient does not participate in regular exercise at present. She generally feels fairly well. She reports sleeping fairly well. She does have additional problems to discuss today.   Patient reports that she has skin lesions that are all over her body that comes and goes. She denies any pain. She reports that she does bruise once the lesions go away. She has had symptoms for at least 6 months.   Osteoporosis Declines DEXA. Has osteoporosis, last scan 08/2018 showed T score of -2.5. She was placed on fosamax which she took for one month and stopped due to bone pain. She does not want to take any other medications for osteoporosis.  Iron Deficiency Anemia  This was recently detected on 07/24/2020 visit with oncology. She was started on iron pills. She deferred GI workup until she was seen her. She is currently on warfarin chronically for a-fib. Her last colonoscopy was in 2016 and was normal. She denies abdominal pain, hematemesis, hematochezia.   CBC Latest Ref Rng & Units 07/24/2020 01/17/2020 11/23/2019  WBC 4.0 - 10.5 K/uL 9.2 11.0(H) 9.7  Hemoglobin 12.0 - 15.0 g/dL 10.6(L) 12.1 11.7(L)  Hematocrit 36 - 46 % 33.8(L) 38.1 36.4  Platelets 150 - 400 K/uL 225 258 228    DCIS  She is followed by cancer center, she has had double mastectomy. Recently taken off letrozole.   Hypertension, follow-up  BP Readings from Last 3 Encounters:  08/08/20 132/72  07/24/20 (!) 144/75  11/30/19 (!) 137/98   Wt Readings from Last 3 Encounters:  08/08/20 225 lb 12.8 oz (102.4 kg)  07/24/20 225 lb 8 oz  (102.3 kg)  11/30/19 235 lb 14.3 oz (107 kg)     She was last seen for hypertension 6 months ago.  BP at that visit was normal. Management since that visit includes continue medications.  She reports excellent compliance with treatment. She is not having side effects.  She is following a Regular diet. She is not exercising. She does not smoke.  Use of agents associated with hypertension: none.   Outside blood pressures are not checked. Symptoms: No chest pain No chest pressure  No palpitations No syncope  No dyspnea No orthopnea  No paroxysmal nocturnal dyspnea No lower extremity edema   Pertinent labs: Lab Results  Component Value Date   CHOL 132 08/09/2019   HDL 50 08/09/2019   LDLCALC 55 08/09/2019   TRIG 133 08/09/2019   CHOLHDL 2.6 08/09/2019   Lab Results  Component Value Date   NA 140 07/24/2020   K 3.6 07/24/2020   CREATININE 0.91 07/24/2020   GFRNONAA >60 07/24/2020   GFRAA >60 07/24/2020   GLUCOSE 154 (H) 07/24/2020     The 10-year ASCVD risk score Mikey Bussing DC Jr., et al., 2013) is: 12%   --------------------------------------------------------------------------------------------------- Lipid/Cholesterol, Follow-up  Last lipid panel Other pertinent labs  Lab Results  Component Value Date   CHOL 132 08/09/2019   HDL 50 08/09/2019   LDLCALC 55 08/09/2019   TRIG 133 08/09/2019   CHOLHDL 2.6 08/09/2019  Lab Results  Component Value Date   ALT 13 07/24/2020   AST 18 07/24/2020   PLT 225 07/24/2020   TSH 1.77 12/20/2014     She was last seen for this 6 months ago.  Management since that visit includes continue statin.  She reports good compliance with treatment. She is not having side effects.   Symptoms: No chest pain No chest pressure/discomfort  No dyspnea No lower extremity edema  No numbness or tingling of extremity No orthopnea  No palpitations No paroxysmal nocturnal dyspnea  No speech difficulty No syncope   Current diet: in general,  an "unhealthy" diet Current exercise: none  The 10-year ASCVD risk score Mikey Bussing DC Jr., et al., 2013) is: 12%  --------------------------------------------------------------------------------------------------- Depression, Follow-up  She  was last seen for this 6 months ago. Changes made at last visit include continue Lexapro 10 mg QD.   She reports good compliance with treatment. She is not having side effects.   She reports good tolerance of treatment. Current symptoms include: none She feels she is Unchanged since last visit.  Depression screen [facility] 2/9 08/08/2020 06/20/2020 02/09/2020  Decreased Interest 1 0 0  Down, Depressed, Hopeless 0 1 0  PHQ - 2 Score 1 1 0  Altered sleeping 0 - 0  Tired, decreased energy 3 - 1  Change in appetite 0 - 0  Feeling bad or failure about yourself  0 - 0  Trouble concentrating 0 - 0  Moving slowly or fidgety/restless 0 - 0  Suicidal thoughts 0 - 0  PHQ-9 Score 4 - 1  Difficult doing work/chores Not difficult at all - Not difficult at all    -----------------------------------------------------------------------------------------   Past Medical History:  Diagnosis Date  . Anxiety   . Atrial fibrillation ([place]) 2014  . Breast cancer ([place]) 2014   right breast  . Dysrhythmia   . Fibrocystic breast disease 2005  . GERD (gastroesophageal reflux disease)   . Hypertension 2005  . Lump or mass in breast 2014   right mastectomy  . Malignant neoplasm of central portion of female breast ([place]) 01/24/2013   Stage II,T2, N1a. ER/PR 90%, HER-2/neu: Not overexpressed. Mastectomy with sentinel node biopsy. 2 sentinel nodes negative, non-sentinel node w/ 5 mm macrometastatic foci in non-sentinel node.  Received post mastectomy radiation.    Past Surgical History:  Procedure Laterality Date  . ABDOMINAL HYSTERECTOMY  1999  . BREAST BIOPSY Left 2015   BENIGN FIBROTIC BREAST TISSUE WITH CLUSTERED APOCRINE  . BREAST BIOPSY Left 11/02/2019   affirm bx,  x marker, path pending  . BREAST SURGERY Right 2005,2014   biopsy  . BREAST SURGERY Right 2014   mastectomy  . COLONOSCOPY  2002  . COLONOSCOPY WITH PROPOFOL N/A 10/16/2015   Procedure: COLONOSCOPY WITH PROPOFOL;  Surgeon: Robert Bellow, MD;  Location: [facility] ENDOSCOPY;  Service: Endoscopy;  Laterality: N/A;  . KNEE SURGERY Left 2005  . MASTECTOMY Right 2014   radiation chemo  . SIMPLE MASTECTOMY WITH AXILLARY SENTINEL NODE BIOPSY Left 11/30/2019   Procedure: SIMPLE MASTECTOMY WITH AXILLARY SENTINEL NODE BIOPSY;  Surgeon: Robert Bellow, MD;  Location: [facility] ORS;  Service: General;  Laterality: Left;  . thumb surg Right 2004  . TONSILLECTOMY     age of 3   Social History   Socioeconomic History  . Marital status: Widowed    Spouse name: Jamelle Noy  . Number of children: 0  . Years of education: 44  . Highest education level: 12th grade  Occupational History  . Occupation: retired  Tobacco Use  . Smoking status: Never Smoker  . Smokeless tobacco: Never Used  Vaping Use  . Vaping Use: Never used  Substance and Sexual Activity  . Alcohol use: No  . Drug use: No  . Sexual activity: Not on file  Other Topics Concern  . Not on file  Social History Narrative  . Not on file   Social Determinants of Health   Financial Resource Strain: Low Risk   . Difficulty of Paying Living Expenses: Not hard at all  Food Insecurity: No Food Insecurity  . Worried About Charity fundraiser in the Last Year: Never true  . Ran Out of Food in the Last Year: Never true  Transportation Needs: No Transportation Needs  . Lack of Transportation (Medical): No  . Lack of Transportation (Non-Medical): No  Physical Activity: Inactive  . Days of Exercise per Week: 0 days  . Minutes of Exercise per Session: 0 min  Stress: No Stress Concern Present  . Feeling of Stress : Not at all  Social Connections: Socially Isolated  . Frequency of Communication with Friends and Family: Twice a week  .  Frequency of Social Gatherings with Friends and Family: Never  . Attends Religious Services: Never  . Active Member of Clubs or Organizations: No  . Attends Archivist Meetings: Never  . Marital Status: Widowed  Intimate Partner Violence: Not At Risk  . Fear of Current or Ex-Partner: No  . Emotionally Abused: No  . Physically Abused: No  . Sexually Abused: No   Family Status  Relation Name Status  . Father  Deceased at age 35       due to enlarged heart  . Sister  Alive  . Mother  Deceased at age 83       cancer-unknown type  . Sister  Alive  . Other  (Not Specified)  . Ethlyn Daniels  (Not Specified)  . PGM  (Not Specified)   Family History  Problem Relation Age of Onset  . Heart disease Father   . Hypertension Sister   . Cancer Mother        cervical  . Cancer Other        ovarian,liver,colon cancer-no family member listed  . Osteoporosis Paternal Aunt   . Osteoporosis Paternal Grandmother    Allergies  Allergen Reactions  . Taxotere [Docetaxel] Itching and Rash  . Sulfa Antibiotics Hives    Patient Care Team: Paulene Floor as PCP - General (Physician Assistant) Bary Castilla Forest Gleason, MD as Consulting Physician (General Surgery) Teodoro Spray, MD as Consulting Physician (Cardiology) Earlie Server, MD as Consulting Physician (Oncology) Cleaster Corin, OD (Optometry)   Medications: Outpatient Medications Prior to Visit  Medication Sig  . acetaminophen (TYLENOL) 500 MG tablet Take 1,500 mg by mouth every 4 (four) hours as needed for moderate pain.   Marland Kitchen amLODipine (NORVASC) 2.5 MG tablet TAKE 1 TABLET EVERY DAY  . aspirin 81 MG chewable tablet Chew 81 mg by mouth daily. (Patient not taking: Reported on 07/24/2020)  . atorvastatin (LIPITOR) 10 MG tablet TAKE 1 TABLET EVERY DAY  . bisoprolol-hydrochlorothiazide (ZIAC) 10-6.25 MG tablet Take 1 tablet by mouth daily.  . Calcium Carb-Cholecalciferol (OYSTER SHELL CALCIUM + D PO) Take 1 tablet by mouth daily. 632m  calcium plus 20 mg Vit D  . Cholecalciferol (VITAMIN D-3) 25 MCG (1000 UT) CAPS Take 1,000 Units by mouth daily.   .Marland Kitchenescitalopram (LEXAPRO) 10  MG tablet TAKE 1 TABLET EVERY DAY  . ferrous gluconate (FERGON) 324 MG tablet Take 1 tablet (324 mg total) by mouth 2 (two) times daily with a meal.  . ferrous gluconate (FERGON) 324 MG tablet Take 1 tablet (324 mg total) by mouth 2 (two) times daily with a meal.  . hydrochlorothiazide (HYDRODIURIL) 12.5 MG tablet TAKE 1 TABLET EVERY DAY  . lisinopril (ZESTRIL) 20 MG tablet TAKE 1 TABLET EVERY DAY (Patient taking differently: Take 20 mg by mouth daily. )  . omeprazole (PRILOSEC) 20 MG capsule TAKE 1 CAPSULE EVERY DAY  . potassium chloride SA (KLOR-CON) 20 MEQ tablet TAKE 1 TABLET EVERY DAY (Patient taking differently: Take 20 mEq by mouth daily. )  . traZODone (DESYREL) 50 MG tablet TAKE 1 TABLET EVERY DAY  . warfarin (COUMADIN) 2 MG tablet Take 2 mg by mouth as directed. 1.5 mg x 5 days and 70m x 2 days   No facility-administered medications prior to visit.    Review of Systems  Constitutional: Negative.   HENT: Negative.   Eyes: Negative.   Respiratory: Negative.   Cardiovascular: Negative.   Gastrointestinal: Negative.   Endocrine: Negative.   Genitourinary: Negative.   Musculoskeletal: Positive for arthralgias and myalgias.  Skin: Positive for color change.  Allergic/Immunologic: Negative.   Neurological: Negative.   Hematological: Bruises/bleeds easily.  Psychiatric/Behavioral: Negative.       Objective    BP 132/72   Pulse 72   Temp 98.1 F (36.7 C)   Ht _0  (1.626 m)   Wt 225 lb 12.8 oz (102.4 kg)   BMI 38.76 kg/m    Physical Exam Constitutional:      Appearance: She is obese.  HENT:     Right Ear: Tympanic membrane normal.     Left Ear: Tympanic membrane normal.  Eyes:     Pupils: Pupils are equal, round, and reactive to light.  Cardiovascular:     Rate and Rhythm: Normal rate and regular rhythm.     Heart  sounds: Normal heart sounds.  Pulmonary:     Effort: Pulmonary effort is normal.     Breath sounds: Normal breath sounds.  Chest:     Comments: S/p bilateral mastectomy  Abdominal:     General: Bowel sounds are normal.  Skin:    General: Skin is warm and dry.     Findings: Erythema and rash present.     Comments: Numerous scabbing wounds scattered across body.   Neurological:     Mental Status: She is alert and oriented to person, place, and time. Mental status is at baseline.  Psychiatric:        Mood and Affect: Mood normal.        Behavior: Behavior normal.       Last depression screening scores PHQ 2/9 Scores 08/08/2020 06/20/2020 02/09/2020  PHQ - 2 Score 1 1 0  PHQ- 9 Score 4 - 1   Last fall risk screening Fall Risk  06/20/2020  Falls in the past year? 0  Number falls in past yr: 0  Injury with Fall? 0   Last Audit-C alcohol use screening Alcohol Use Disorder Test (AUDIT) 06/20/2020  1. How often do you have a drink containing alcohol? 0  2. How many drinks containing alcohol do you have on a typical day when you are drinking? 0  3. How often do you have six or more drinks on one occasion? 0  AUDIT-C Score 0  Alcohol Brief Interventions/Follow-up AUDIT Score <  7 follow-up not indicated   A score of 3 or more in women, and 4 or more in men indicates increased risk for alcohol abuse, EXCEPT if all of the points are from question 1   No results found for any visits on 08/08/20.  Assessment & Plan    Routine Health Maintenance and Physical Exam  Exercise Activities and Dietary recommendations Goals    . DIET - REDUCE SUGAR INTAKE     Recommend cutting out sugar and ice cream from daily diet and substituting for sugar free snacks.     . Exercise 150 minutes per week (moderate activity)    . Increase water intake     Recommend increasing water intake to 4 glasses of water a day.        Immunization History  Administered Date(s) Administered  . Fluad Quad(high  Dose 65+) 09/19/2019  . Influenza, High Dose Seasonal PF 09/14/2018  . Influenza,inj,Quad PF,6+ Mos 09/06/2015, 09/21/2017  . Pneumococcal Conjugate-13 08/22/2015  . Pneumococcal Polysaccharide-23 09/03/2016, 09/21/2017    Health Maintenance  Topic Date Due  . COVID-19 Vaccine (1) Never done  . INFLUENZA VACCINE  07/22/2020  . TETANUS/TDAP  12/22/2026 (Originally 03/23/1970)  . DEXA SCAN  09/08/2020  . MAMMOGRAM  10/25/2021  . COLONOSCOPY  10/15/2025  . Hepatitis C Screening  Completed  . PNA vac Low Risk Adult  Completed    Discussed health benefits of physical activity, and encouraged her to engage in regular exercise appropriate for her age and condition.   1. Annual physical exam  UTD on screenings. She declines DEXA.   2. Iron deficiency  Would recommend GI workup. Have made referral as below.   - Ambulatory referral to Gastroenterology  3. Chronic anticoagulation  Followed by cardiology. She is following with GI for IDA wokrup.    4. Osteoporosis, unspecified osteoporosis type, unspecified pathological fracture presence  Declines dexa. Explained increased risk of fx. Had fosamax x 1 month which caused bone pain and she does not desire other medications for this.  5. Skin lesions  She constantly picks at skin. Multiple wounds today. Update tetanus.  6. Blood glucose elevated  Check a1c. Counseled on diet.  7. Hypercholesteremia  Previously well controlled Continue statin Repeat FLP and CMP Goal LDL < 100  - Comprehensive Metabolic Panel (CMET) - Lipid Profile - HgB A1c  8. Depression, unspecified depression type  Continue lexapro and trazadone for insomnia.   9. Atrial fibrillation, unspecified type ([place])   10. Cut of hand, unspecified laterality, initial encounter  Update tetanus.  No follow-ups on file.     ITrinna Post, PA-C, have reviewed all documentation for this visit. The documentation on 08/08/20 for the exam, diagnosis,  procedures, and orders are all accurate and complete.  The entirety of the information documented in the History of Present Illness, Review of Systems and Physical Exam were personally obtained by me. Portions of this information were initially documented by Wilburt Finlay, CMA and reviewed by me for thoroughness and accuracy.        Paulene Floor  [facility] [phone] (phone) [phone] (fax)  [place]

## 2020-08-08 ENCOUNTER — Other Ambulatory Visit: Payer: Self-pay

## 2020-08-08 ENCOUNTER — Encounter: Payer: Self-pay | Admitting: Physician Assistant

## 2020-08-08 ENCOUNTER — Ambulatory Visit (INDEPENDENT_AMBULATORY_CARE_PROVIDER_SITE_OTHER): Payer: Medicare HMO | Admitting: Physician Assistant

## 2020-08-08 VITALS — BP 132/72 | HR 72 | Temp 98.1°F | Ht 64.0 in | Wt 225.8 lb

## 2020-08-08 DIAGNOSIS — E7800 Pure hypercholesterolemia, unspecified: Secondary | ICD-10-CM

## 2020-08-08 DIAGNOSIS — R739 Hyperglycemia, unspecified: Secondary | ICD-10-CM | POA: Diagnosis not present

## 2020-08-08 DIAGNOSIS — I4891 Unspecified atrial fibrillation: Secondary | ICD-10-CM | POA: Diagnosis not present

## 2020-08-08 DIAGNOSIS — L989 Disorder of the skin and subcutaneous tissue, unspecified: Secondary | ICD-10-CM

## 2020-08-08 DIAGNOSIS — Z7901 Long term (current) use of anticoagulants: Secondary | ICD-10-CM

## 2020-08-08 DIAGNOSIS — S61419A Laceration without foreign body of unspecified hand, initial encounter: Secondary | ICD-10-CM | POA: Diagnosis not present

## 2020-08-08 DIAGNOSIS — Z0000 Encounter for general adult medical examination without abnormal findings: Secondary | ICD-10-CM

## 2020-08-08 DIAGNOSIS — Z23 Encounter for immunization: Secondary | ICD-10-CM

## 2020-08-08 DIAGNOSIS — F32A Depression, unspecified: Secondary | ICD-10-CM

## 2020-08-08 DIAGNOSIS — M810 Age-related osteoporosis without current pathological fracture: Secondary | ICD-10-CM | POA: Diagnosis not present

## 2020-08-08 DIAGNOSIS — F329 Major depressive disorder, single episode, unspecified: Secondary | ICD-10-CM

## 2020-08-08 DIAGNOSIS — E611 Iron deficiency: Secondary | ICD-10-CM

## 2020-08-08 NOTE — Addendum Note (Signed)
Addended by: Wilburt Finlay on: 08/08/2020 02:39 PM   Modules accepted: Orders

## 2020-08-09 ENCOUNTER — Encounter: Payer: Self-pay | Admitting: Family Medicine

## 2020-08-10 ENCOUNTER — Other Ambulatory Visit: Payer: Self-pay | Admitting: Physician Assistant

## 2020-08-10 DIAGNOSIS — I10 Essential (primary) hypertension: Secondary | ICD-10-CM

## 2020-08-10 DIAGNOSIS — E7800 Pure hypercholesterolemia, unspecified: Secondary | ICD-10-CM | POA: Diagnosis not present

## 2020-08-11 LAB — HEMOGLOBIN A1C
Est. average glucose Bld gHb Est-mCnc: 105 mg/dL
Hgb A1c MFr Bld: 5.3 % (ref 4.8–5.6)

## 2020-08-11 LAB — COMPREHENSIVE METABOLIC PANEL
ALT: 12 IU/L (ref 0–32)
AST: 18 IU/L (ref 0–40)
Albumin/Globulin Ratio: 1.2 (ref 1.2–2.2)
Albumin: 3.9 g/dL (ref 3.8–4.8)
Alkaline Phosphatase: 93 IU/L (ref 48–121)
BUN/Creatinine Ratio: 13 (ref 12–28)
BUN: 12 mg/dL (ref 8–27)
Bilirubin Total: 0.4 mg/dL (ref 0.0–1.2)
CO2: 25 mmol/L (ref 20–29)
Calcium: 9 mg/dL (ref 8.7–10.3)
Chloride: 100 mmol/L (ref 96–106)
Creatinine, Ser: 0.9 mg/dL (ref 0.57–1.00)
GFR calc Af Amer: 75 mL/min/{1.73_m2} (ref >59)
GFR calc non Af Amer: 65 mL/min/{1.73_m2} (ref >59)
Globulin, Total: 3.2 g/dL (ref 1.5–4.5)
Glucose: 155 mg/dL — ABNORMAL HIGH (ref 65–99)
Potassium: 3.7 mmol/L (ref 3.5–5.2)
Sodium: 140 mmol/L (ref 134–144)
Total Protein: 7.1 g/dL (ref 6.0–8.5)

## 2020-08-11 LAB — LIPID PANEL
Chol/HDL Ratio: 2.8 ratio (ref 0.0–4.4)
Cholesterol, Total: 142 mg/dL (ref 100–199)
HDL: 50 mg/dL (ref >39)
LDL Chol Calc (NIH): 64 mg/dL (ref 0–99)
Triglycerides: 168 mg/dL — ABNORMAL HIGH (ref 0–149)
VLDL Cholesterol Cal: 28 mg/dL (ref 5–40)

## 2020-08-16 ENCOUNTER — Telehealth: Payer: Self-pay

## 2020-08-16 NOTE — Telephone Encounter (Signed)
-----   Message from Mar Daring, [place] sent at 08/15/2020  4:57 PM EDT ----- Good evening I am reviewing labs for Erin Romero as she is out of the office.   Your labs show normal kidney and liver function. Normal sodium, potassium, and calcium levels. Your sugar level is elevated, but your A1c is normal. Cholesterol is normal.   Please call if you have any questions. Grace Bushy, [facility]

## 2020-08-16 NOTE — Telephone Encounter (Signed)
Called patient and no answer left voicemail message for patient to return call. If patient  Calls back okay for PEC to advise of results.

## 2020-08-16 NOTE — Telephone Encounter (Signed)
Pt given lab results per notes of Fenton Malling, PA-C on 08/15/20. Pt verbalized understanding.

## 2020-08-23 DIAGNOSIS — I4891 Unspecified atrial fibrillation: Secondary | ICD-10-CM | POA: Diagnosis not present

## 2020-09-04 ENCOUNTER — Other Ambulatory Visit: Payer: Self-pay | Admitting: Physician Assistant

## 2020-09-04 DIAGNOSIS — F419 Anxiety disorder, unspecified: Secondary | ICD-10-CM

## 2020-09-04 NOTE — Telephone Encounter (Signed)
Requested Prescriptions  Pending Prescriptions Disp Refills  . escitalopram (LEXAPRO) 10 MG tablet [Pharmacy Med Name: ESCITALOPRAM OXALATE 10 MG Tablet] 90 tablet 1    Sig: TAKE 1 TABLET EVERY DAY     Psychiatry:  Antidepressants - SSRI Passed - 09/04/2020 12:17 AM      Passed - Completed PHQ-2 or PHQ-9 in the last 360 days.      Passed - Valid encounter within last 6 months    Recent Outpatient Visits          3 weeks ago Annual physical exam   [facility] Carles Collet M, [place]   6 months ago [place], Wendee Beavers, [place]   1 year ago Annual physical exam   [facility] Trinna Post, [place]   1 year ago Essential hypertension   [place], [place], [place]   1 year ago Osteoporosis, unspecified osteoporosis type, unspecified pathological fracture presence   [facility] Trinna Post, [place]      Future Appointments            In 5 months Terrilee Croak, [place], PA-C [employer], [place]

## 2020-09-05 ENCOUNTER — Other Ambulatory Visit: Payer: Self-pay | Admitting: Physician Assistant

## 2020-09-05 NOTE — Telephone Encounter (Signed)
Requested Prescriptions  Pending Prescriptions Disp Refills  . omeprazole (PRILOSEC) 20 MG capsule [Pharmacy Med Name: OMEPRAZOLE 20 MG Capsule Delayed Release] 90 capsule 3    Sig: TAKE 1 CAPSULE EVERY DAY     Gastroenterology: Proton Pump Inhibitors Passed - 09/05/2020  4:18 PM      Passed - Valid encounter within last 12 months    Recent Outpatient Visits          4 weeks ago Annual physical exam   [facility] Carles Collet M, PA-C   6 months ago [place], Wendee Beavers, [place]   1 year ago Annual physical exam   [facility] Trinna Post, [place]   1 year ago Essential hypertension   [place], [place], [place]   1 year ago Osteoporosis, unspecified osteoporosis type, unspecified pathological fracture presence   [facility] Trinna Post, [place]      Future Appointments            In 5 months Trinna Post, PA-C [employer], [place]

## 2020-09-20 ENCOUNTER — Other Ambulatory Visit: Payer: Self-pay | Admitting: Oncology

## 2020-09-22 ENCOUNTER — Other Ambulatory Visit: Payer: Self-pay | Admitting: Physician Assistant

## 2020-09-22 DIAGNOSIS — G4700 Insomnia, unspecified: Secondary | ICD-10-CM

## 2020-09-24 ENCOUNTER — Other Ambulatory Visit: Payer: Self-pay | Admitting: Oncology

## 2020-09-24 DIAGNOSIS — I4891 Unspecified atrial fibrillation: Secondary | ICD-10-CM | POA: Diagnosis not present

## 2020-10-15 ENCOUNTER — Other Ambulatory Visit: Payer: Self-pay | Admitting: Physician Assistant

## 2020-10-15 DIAGNOSIS — I10 Essential (primary) hypertension: Secondary | ICD-10-CM

## 2020-10-15 NOTE — Telephone Encounter (Signed)
Requested Prescriptions  Pending Prescriptions Disp Refills  . amLODipine (NORVASC) 2.5 MG tablet [Pharmacy Med Name: AMLODIPINE BESYLATE 2.5 MG Tablet] 90 tablet 1    Sig: TAKE 1 TABLET EVERY DAY     Cardiovascular:  Calcium Channel Blockers Passed - 10/15/2020  1:42 PM      Passed - Last BP in normal range    BP Readings from Last 1 Encounters:  08/08/20 132/72         Passed - Valid encounter within last 6 months    Recent Outpatient Visits          2 months ago Annual physical exam   [facility] Carles Collet M, [place]   8 months ago [place], Wendee Beavers, [place]   1 year ago Annual physical exam   [facility] Trinna Post, [place]   1 year ago Essential hypertension   [place], [place], [place]   2 years ago Osteoporosis, unspecified osteoporosis type, unspecified pathological fracture presence   [facility] Trinna Post, [place]      Future Appointments            In 3 months Terrilee Croak, [place], PA-C [employer], [place]

## 2020-10-19 ENCOUNTER — Other Ambulatory Visit: Payer: Self-pay

## 2020-10-22 ENCOUNTER — Inpatient Hospital Stay: Payer: Medicare HMO | Attending: Oncology

## 2020-10-22 ENCOUNTER — Other Ambulatory Visit: Payer: Self-pay

## 2020-10-22 DIAGNOSIS — Z9013 Acquired absence of bilateral breasts and nipples: Secondary | ICD-10-CM | POA: Insufficient documentation

## 2020-10-22 DIAGNOSIS — M810 Age-related osteoporosis without current pathological fracture: Secondary | ICD-10-CM | POA: Diagnosis not present

## 2020-10-22 DIAGNOSIS — Z79899 Other long term (current) drug therapy: Secondary | ICD-10-CM | POA: Diagnosis not present

## 2020-10-22 DIAGNOSIS — Z7901 Long term (current) use of anticoagulants: Secondary | ICD-10-CM | POA: Insufficient documentation

## 2020-10-22 DIAGNOSIS — Z9221 Personal history of antineoplastic chemotherapy: Secondary | ICD-10-CM | POA: Insufficient documentation

## 2020-10-22 DIAGNOSIS — I10 Essential (primary) hypertension: Secondary | ICD-10-CM | POA: Insufficient documentation

## 2020-10-22 DIAGNOSIS — C50911 Malignant neoplasm of unspecified site of right female breast: Secondary | ICD-10-CM | POA: Insufficient documentation

## 2020-10-22 DIAGNOSIS — Z170 Estrogen receptor positive status [ER+]: Secondary | ICD-10-CM | POA: Insufficient documentation

## 2020-10-22 DIAGNOSIS — Z923 Personal history of irradiation: Secondary | ICD-10-CM | POA: Diagnosis not present

## 2020-10-22 DIAGNOSIS — N6011 Diffuse cystic mastopathy of right breast: Secondary | ICD-10-CM | POA: Diagnosis not present

## 2020-10-22 DIAGNOSIS — D0512 Intraductal carcinoma in situ of left breast: Secondary | ICD-10-CM

## 2020-10-22 DIAGNOSIS — I4891 Unspecified atrial fibrillation: Secondary | ICD-10-CM | POA: Diagnosis not present

## 2020-10-22 DIAGNOSIS — K219 Gastro-esophageal reflux disease without esophagitis: Secondary | ICD-10-CM | POA: Insufficient documentation

## 2020-10-22 LAB — CBC WITH DIFFERENTIAL/PLATELET
Abs Immature Granulocytes: 0.04 10*3/uL (ref 0.00–0.07)
Basophils Absolute: 0 10*3/uL (ref 0.0–0.1)
Basophils Relative: 0 %
Eosinophils Absolute: 0.2 10*3/uL (ref 0.0–0.5)
Eosinophils Relative: 2 %
HCT: 39.7 % (ref 36.0–46.0)
Hemoglobin: 13.7 g/dL (ref 12.0–15.0)
Immature Granulocytes: 0 %
Lymphocytes Relative: 17 %
Lymphs Abs: 1.6 10*3/uL (ref 0.7–4.0)
MCH: 29.8 pg (ref 26.0–34.0)
MCHC: 34.5 g/dL (ref 30.0–36.0)
MCV: 86.3 fL (ref 80.0–100.0)
Monocytes Absolute: 0.7 10*3/uL (ref 0.1–1.0)
Monocytes Relative: 7 %
Neutro Abs: 7.1 10*3/uL (ref 1.7–7.7)
Neutrophils Relative %: 74 %
Platelets: 203 10*3/uL (ref 150–400)
RBC: 4.6 MIL/uL (ref 3.87–5.11)
RDW: 15.6 % — ABNORMAL HIGH (ref 11.5–15.5)
WBC: 9.6 10*3/uL (ref 4.0–10.5)
nRBC: 0 % (ref 0.0–0.2)

## 2020-10-22 LAB — COMPREHENSIVE METABOLIC PANEL
ALT: 13 U/L (ref 0–44)
AST: 19 U/L (ref 15–41)
Albumin: 3.5 g/dL (ref 3.5–5.0)
Alkaline Phosphatase: 68 U/L (ref 38–126)
Anion gap: 9 (ref 5–15)
BUN: 12 mg/dL (ref 8–23)
CO2: 30 mmol/L (ref 22–32)
Calcium: 8.8 mg/dL — ABNORMAL LOW (ref 8.9–10.3)
Chloride: 100 mmol/L (ref 98–111)
Creatinine, Ser: 1.05 mg/dL — ABNORMAL HIGH (ref 0.44–1.00)
GFR, Estimated: 57 mL/min — ABNORMAL LOW (ref >60)
Glucose, Bld: 165 mg/dL — ABNORMAL HIGH (ref 70–99)
Potassium: 3.5 mmol/L (ref 3.5–5.1)
Sodium: 139 mmol/L (ref 135–145)
Total Bilirubin: 0.9 mg/dL (ref 0.3–1.2)
Total Protein: 7.5 g/dL (ref 6.5–8.1)

## 2020-10-22 LAB — FERRITIN: Ferritin: 45 ng/mL (ref 11–307)

## 2020-10-22 LAB — IRON AND TIBC
Iron: 67 ug/dL (ref 28–170)
Saturation Ratios: 20 % (ref 10.4–31.8)
TIBC: 329 ug/dL (ref 250–450)
UIBC: 262 ug/dL

## 2020-10-23 ENCOUNTER — Inpatient Hospital Stay: Payer: Medicare HMO | Admitting: Oncology

## 2020-10-23 ENCOUNTER — Encounter: Payer: Self-pay | Admitting: Oncology

## 2020-10-23 VITALS — BP 157/87 | HR 68 | Temp 99.0°F | Resp 18 | Wt 228.8 lb

## 2020-10-23 DIAGNOSIS — I4891 Unspecified atrial fibrillation: Secondary | ICD-10-CM | POA: Diagnosis not present

## 2020-10-23 DIAGNOSIS — D500 Iron deficiency anemia secondary to blood loss (chronic): Secondary | ICD-10-CM

## 2020-10-23 DIAGNOSIS — M810 Age-related osteoporosis without current pathological fracture: Secondary | ICD-10-CM | POA: Diagnosis not present

## 2020-10-23 DIAGNOSIS — Z923 Personal history of irradiation: Secondary | ICD-10-CM | POA: Diagnosis not present

## 2020-10-23 DIAGNOSIS — N6011 Diffuse cystic mastopathy of right breast: Secondary | ICD-10-CM | POA: Diagnosis not present

## 2020-10-23 DIAGNOSIS — Z853 Personal history of malignant neoplasm of breast: Secondary | ICD-10-CM

## 2020-10-23 DIAGNOSIS — D0512 Intraductal carcinoma in situ of left breast: Secondary | ICD-10-CM | POA: Diagnosis not present

## 2020-10-23 DIAGNOSIS — K219 Gastro-esophageal reflux disease without esophagitis: Secondary | ICD-10-CM | POA: Diagnosis not present

## 2020-10-23 DIAGNOSIS — C50911 Malignant neoplasm of unspecified site of right female breast: Secondary | ICD-10-CM | POA: Diagnosis not present

## 2020-10-23 DIAGNOSIS — Z9013 Acquired absence of bilateral breasts and nipples: Secondary | ICD-10-CM | POA: Diagnosis not present

## 2020-10-23 DIAGNOSIS — M816 Localized osteoporosis [Lequesne]: Secondary | ICD-10-CM | POA: Diagnosis not present

## 2020-10-23 DIAGNOSIS — Z9221 Personal history of antineoplastic chemotherapy: Secondary | ICD-10-CM | POA: Diagnosis not present

## 2020-10-23 DIAGNOSIS — Z170 Estrogen receptor positive status [ER+]: Secondary | ICD-10-CM | POA: Diagnosis not present

## 2020-10-23 NOTE — Progress Notes (Signed)
Erin Romero is a 69 y.o. female with stage IIB right breast cancer who is here for follow up.   PERTINENT ONCOLOGY HISTORY Patient previously followed up with Dr. Mike Gip.  Switch care to me on 08/16/2019. Extensive medical records were reviewed. history of stage IIB (T2N1M0) right breast cancer status post radical mastectomy on 2/3/2 medical 014.  Pathology revealed a 3 cm grade 2 invasive mammary carcinoma. 0/2 sentinel lymph nodes were positive.  In non-sentinel lymph nodes were positive for 0.5 cm macro metastasis with extracapsular extension.  DCIS was present.  ER >90 percent, PR>90%, HER-2/neu negative. Patient underwent 3 cycles of adjuvant chemotherapy with Taxotere and Cytoxan (03/08/2013 - 04/20/2013).  Treatment was complicated with erythromelalgia and an urticarial reaction.  And she did not receive any additional chemotherapy. Adjuvant radiation finished in July 2014.  Patient was started on letrozole in August 2014 and she self discontinued in October 2019. BCI showed low likelihood of benefit from extended endocrine therapy.  13.6% risk of late recurrence years 5-10. Patient has been off Letrozole since 09/2018.   Mammogram on 02/06/2014 was suspicious for left breast mass.  Left breast biopsy on 02/27/2014 was negative for malignancy.  Left-sided mammogram on 10/05/2017 was negative.   Left breast screening mammogram on 10/11/2018 revealed no evidence of malignancy.  #  Osteoporosis,  Bone density study on 06/05/2015 revealed osteopenia with a T score of -1.9 in the left femoral neck.  Bone density on 09/08/2018 revealed osteoporosis with a T-score of -2.5 in the left femoral neck and -2.2 in AP spine L2-L4.  Patient is on calcium and vitamin D.  She was on an oral bisphosphonate x 1 month.  Patient previosly declined Prolia. with T score of -2.5 of left femoral neck and -2.2 AP  spine L2-L4.  Previously declined Prolia.  She reports that she had tried some bone strengthen medication for 1 month last year, and did not tolerate.  She cannot remember the name of the medication.  # 01/30/19 left DCIS ER +, s/p mastectomy Patient was resumed on letrozole in December 2020.  After discussion with Dr. Bary Castilla, decision was made to stop letrozole given that patient has now had bilateral mastectomy and benefit of prophylaxis from AI is very small   #Osteoporosis, continue calcium and vitamin D supplementation. Previously discussed about bisphosphonate or Prolia treatment.  Patient declined.   #Family history of cancer, personal history of invasive breast cancer.  No newly diagnosed DCIS, I discussed with patient about genetic testing.  Patient declines.  INTERVAL HISTORY Erin Romero is a 69 y.o. female who has above history reviewed by me today presents for follow up visit for management of breast cancer Problems and complaints are listed below: Patient reports feeling tired and fatigued.  No particular concern about mastectomy sites. She has an upcoming appointment with Dr. Bary Castilla at the end of the year.  Past Medical History:  Diagnosis Date  . Anxiety   . Atrial fibrillation ([place]) 2014  . Breast cancer ([place]) 2014   right breast  . Dysrhythmia   . Fibrocystic breast disease 2005  . GERD (gastroesophageal reflux disease)   . Hypertension 2005  . Lump or mass in breast 2014   right mastectomy  . Malignant neoplasm of central portion of female breast ([place]) 01/24/2013   Stage II,T2, N1a. ER/PR 90%, HER-2/neu: Not overexpressed. Mastectomy with sentinel node biopsy. 2 sentinel  nodes negative, non-sentinel node w/ 5 mm macrometastatic foci in non-sentinel node.  Received post mastectomy radiation.     Past Surgical History:  Procedure Laterality Date  . ABDOMINAL HYSTERECTOMY  1999  . BREAST BIOPSY Left 2015   BENIGN FIBROTIC BREAST TISSUE WITH CLUSTERED  APOCRINE  . BREAST BIOPSY Left 11/02/2019   affirm bx, x marker, path pending  . BREAST SURGERY Right 2005,2014   biopsy  . BREAST SURGERY Right 2014   mastectomy  . COLONOSCOPY  2002  . COLONOSCOPY WITH PROPOFOL N/A 10/16/2015   Procedure: COLONOSCOPY WITH PROPOFOL;  Surgeon: Robert Bellow, MD;  Location[facility]COPY;  Service: Endoscopy;  Laterality: N/A;  . KNEE SURGERY Left 2005  . MASTECTOMY Right 2014   radiation chemo  . SIMPLE MASTECTOMY WITH AXILLARY SENTINEL NODE BIOPSY Left 11/30/2019   Procedure: SIMPLE MASTECTOMY WITH AXILLARY SENTINEL NODE BIOPSY;  Surgeon: Robert Bellow, MD;  Location: [facility] ORS;  Service: General;  Laterality: Left;  . thumb surg Right 2004  . TONSILLECTOMY     age of 63    Family History  Problem Relation Age of Onset  . Heart disease Father   . Hypertension Sister   . Cancer Mother        cervical  . Cancer Other        ovarian,liver,colon cancer-no family member listed  . Osteoporosis Paternal Aunt   . Osteoporosis Paternal Grandmother     Social History:  reports that she has never smoked. She has never used smokeless tobacco. She reports that she does not drink alcohol and does not use drugs.    Allergies:  Allergies  Allergen Reactions  . Taxotere [Docetaxel] Itching and Rash  . Sulfa Antibiotics Hives    Current Medications: Current Outpatient Medications  Medication Sig Dispense Refill  . acetaminophen (TYLENOL) 500 MG tablet Take 1,500 mg by mouth every 4 (four) hours as needed for moderate pain.     Marland Kitchen amLODipine (NORVASC) 2.5 MG tablet TAKE 1 TABLET EVERY DAY 90 tablet 1  . atorvastatin (LIPITOR) 10 MG tablet TAKE 1 TABLET EVERY DAY 90 tablet 1  . bisoprolol-hydrochlorothiazide (ZIAC) 10-6.25 MG tablet Take 1 tablet by mouth daily. 90 tablet 1  . Calcium Carb-Cholecalciferol (OYSTER SHELL CALCIUM + D PO) Take 1 tablet by mouth daily. 665m calcium plus 20 mg Vit D    . Cholecalciferol (VITAMIN D-3) 25 MCG (1000 UT)  CAPS Take 1,000 Units by mouth daily.     .Marland Kitchenescitalopram (LEXAPRO) 10 MG tablet TAKE 1 TABLET EVERY DAY 90 tablet 1  . ferrous gluconate (FERGON) 324 MG tablet Take 1 tablet (324 mg total) by mouth 2 (two) times daily with a meal. 60 tablet 3  . hydrochlorothiazide (HYDRODIURIL) 12.5 MG tablet TAKE 1 TABLET EVERY DAY 90 tablet 1  . lisinopril (ZESTRIL) 20 MG tablet TAKE 1 TABLET EVERY DAY (Patient taking differently: Take 20 mg by mouth daily. ) 90 tablet 1  . omeprazole (PRILOSEC) 20 MG capsule TAKE 1 CAPSULE EVERY DAY 90 capsule 3  . potassium chloride SA (KLOR-CON) 20 MEQ tablet TAKE 1 TABLET EVERY DAY (Patient taking differently: Take 20 mEq by mouth daily. ) 90 tablet 1  . traZODone (DESYREL) 50 MG tablet TAKE 1 TABLET EVERY DAY 90 tablet 0  . warfarin (COUMADIN) 1 MG tablet Take 1 tablet by mouth daily.    .Marland Kitchenwarfarin (COUMADIN) 2 MG tablet Take 2 mg by mouth as directed. 1.5 mg x 5 days and 253m  x 2 days     No current facility-administered medications for this visit.   Review of Systems  Constitutional: Positive for fatigue. Negative for appetite change, chills and fever.  HENT:   Negative for hearing loss and voice change.   Eyes: Negative for eye problems.  Respiratory: Negative for chest tightness and cough.   Cardiovascular: Negative for chest pain.  Gastrointestinal: Negative for abdominal distention, abdominal pain and blood in stool.  Endocrine: Negative for hot flashes.  Genitourinary: Negative for difficulty urinating and frequency.   Musculoskeletal: Positive for arthralgias.  Skin: Negative for itching and rash.  Neurological: Negative for extremity weakness.  Hematological: Negative for adenopathy.  Psychiatric/Behavioral: Negative for confusion.    Physical Exam: Blood pressure (!) 157/87, pulse 68, temperature 99 F (37.2 C), resp. rate 18, weight 228 lb 12.8 oz (103.8 kg). Physical Exam Constitutional:      General: She is not in acute distress.    Appearance:  She is not diaphoretic.     Comments: Morbid obese  HENT:     Head: Normocephalic and atraumatic.     Mouth/Throat:     Pharynx: No oropharyngeal exudate.  Eyes:     General: No scleral icterus.    Pupils: Pupils are equal, round, and reactive to light.  Cardiovascular:     Rate and Rhythm: Normal rate and regular rhythm.     Heart sounds: No murmur heard.   Pulmonary:     Effort: Pulmonary effort is normal. No respiratory distress.  Abdominal:     General: There is no distension.     Palpations: Abdomen is soft.     Tenderness: There is no abdominal tenderness.  Musculoskeletal:        General: Normal range of motion.     Cervical back: Normal range of motion and neck supple.  Skin:    General: Skin is warm and dry.     Findings: No erythema.  Neurological:     Mental Status: She is alert and oriented to person, place, and time.     Cranial Nerves: No cranial nerve deficit.  Psychiatric:        Mood and Affect: Affect normal.   Declined breast examination  RADIOGRAPHIC STUDIES: I have personally reviewed the radiological images as listed and agreed with the findings in the report. No results found.   Assessment:  Ann Bohne is a 69 y.o. female follows up for breast cancer. 1. Ductal carcinoma in situ (DCIS) of left breast   2. Localized osteoporosis without current pathological fracture   3. History of breast cancer   4. Iron deficiency anemia due to chronic blood loss    #History of stage IIB (T2N1M0) right breast cancer, ER PR positive HER-2 negative Patient finished approximately 5 years of letrozole and self stopped in 09/2018. #11/2019  left DCIS, high-grade Currently she is off letrozole given that she has had mastectomy for DCIS of treatment and benefit of aromatase inhibitor is low.  Letrozole has been discontinued.  #Osteoporosis, continue calcium and vitamin D supplementation. She declines bisphosphonate treatments.  #Iron deficiency anemia,  labs are reviewed and discussed with patient.  Iron deficiency has resolved.  Hemoglobin normalized and ferritin level has improved. I recommend patient to get gastroenterology work-up.  She has an appointment this week. For the time being I recommend patient to continue oral ferrous sulfate 325 mg daily as maintenance. If bleeding source was discovered after gastroenterology work-up, she may discontinue iron supplementation.  We spent  sufficient time to discuss many aspect of care, questions were answered to patient's satisfaction. Total face to face encounter time for this patient visit was 40 min. >50% of the time was  spent in counseling and coordination of care.   She will follow-up  6- 8 months Earlie Server, MD  10/23/2020 , 1:09 PM

## 2020-10-23 NOTE — Progress Notes (Signed)
Pt here for follow up. No new concerns voiced. No new breast issues.

## 2020-10-24 ENCOUNTER — Ambulatory Visit: Payer: Medicare HMO | Admitting: Gastroenterology

## 2020-10-24 ENCOUNTER — Other Ambulatory Visit: Payer: Self-pay

## 2020-10-24 ENCOUNTER — Encounter: Payer: Self-pay | Admitting: Gastroenterology

## 2020-10-24 ENCOUNTER — Telehealth: Payer: Self-pay

## 2020-10-24 VITALS — BP 156/76 | HR 87 | Temp 98.3°F | Ht 64.0 in | Wt 228.0 lb

## 2020-10-24 DIAGNOSIS — R198 Other specified symptoms and signs involving the digestive system and abdomen: Secondary | ICD-10-CM

## 2020-10-24 DIAGNOSIS — D509 Iron deficiency anemia, unspecified: Secondary | ICD-10-CM

## 2020-10-24 DIAGNOSIS — R0989 Other specified symptoms and signs involving the circulatory and respiratory systems: Secondary | ICD-10-CM

## 2020-10-24 NOTE — Telephone Encounter (Signed)
Cardiac and blood thinner clearance was faxed to Dr. Ubaldo Glassing. Awaiting on response. Procedure date: 11/28/2020

## 2020-10-24 NOTE — Progress Notes (Signed)
Erin Romero  [place], [place] [address]  Main: [phone]  Fax: [phone]   Gastroenterology Consultation  Referring Provider:     Paulene Floor Primary Care Physician:  Paulene Floor Reason for Consultation:    Iron deficiency anemia        HPI:    Chief Complaint  Patient presents with  . IDA    Erin Romero is a 69 y.o. y/o female referred for consultation & management  by Dr. Terrilee Croak, Wendee Beavers, PA-C.  Patient sees Dr. Tasia Catchings and is on iron replacement for iron deficiency anemia.  The only GI symptom patient reports is globus sensation for years and is on Prilosec that controls her heartburn well. The patient denies abdominal or flank pain, anorexia, nausea or vomiting, dysphagia, change in bowel habits or black or bloody stools or weight loss.  Last colonoscopy was in 2016 with Dr. Bary Castilla for screening and I did not find any polyps or concerning abnormalities.  No family history of colon cancer Past Medical History:  Diagnosis Date  . Anxiety   . Atrial fibrillation ([place]) 2014  . Breast cancer ([place]) 2014   right breast  . Dysrhythmia   . Fibrocystic breast disease 2005  . GERD (gastroesophageal reflux disease)   . Hypertension 2005  . Lump or mass in breast 2014   right mastectomy  . Malignant neoplasm of central portion of female breast ([place]) 01/24/2013   Stage II,T2, N1a. ER/PR 90%, HER-2/neu: Not overexpressed. Mastectomy with sentinel node biopsy. 2 sentinel nodes negative, non-sentinel node w/ 5 mm macrometastatic foci in non-sentinel node.  Received post mastectomy radiation.     Past Surgical History:  Procedure Laterality Date  . ABDOMINAL HYSTERECTOMY  1999  . BREAST BIOPSY Left 2015   BENIGN FIBROTIC BREAST TISSUE WITH CLUSTERED APOCRINE  . BREAST BIOPSY Left 11/02/2019   affirm bx, x marker, path pending  . BREAST SURGERY Right 2005,2014   biopsy  . BREAST SURGERY Right 2014    mastectomy  . COLONOSCOPY  2002  . COLONOSCOPY WITH PROPOFOL N/A 10/16/2015   Procedure: COLONOSCOPY WITH PROPOFOL;  Surgeon: Robert Bellow, MD;  Location: [facility] ENDOSCOPY;  Service: Endoscopy;  Laterality: N/A;  . KNEE SURGERY Left 2005  . MASTECTOMY Right 2014   radiation chemo  . SIMPLE MASTECTOMY WITH AXILLARY SENTINEL NODE BIOPSY Left 11/30/2019   Procedure: SIMPLE MASTECTOMY WITH AXILLARY SENTINEL NODE BIOPSY;  Surgeon: Robert Bellow, MD;  Location: [facility] ORS;  Service: General;  Laterality: Left;  . thumb surg Right 2004  . TONSILLECTOMY     age of 3    Prior to Admission medications   Medication Sig Start Date End Date Taking? Authorizing Provider  acetaminophen (TYLENOL) 500 MG tablet Take 1,500 mg by mouth every 4 (four) hours as needed for moderate pain.    Yes [provider]  amLODipine (NORVASC) 2.5 MG tablet TAKE 1 TABLET EVERY DAY 10/15/20  Yes Carles Collet M, PA-C  atorvastatin (LIPITOR) 10 MG tablet TAKE 1 TABLET EVERY DAY 06/26/20  Yes Pollak, Adriana M, PA-C  bisoprolol-hydrochlorothiazide (ZIAC) 10-6.25 MG tablet Take 1 tablet by mouth daily. 02/04/18  Yes Trinna Post, PA-C  Calcium Carb-Cholecalciferol (OYSTER SHELL CALCIUM + D PO) Take 1 tablet by mouth daily. 661m calcium plus 20 mg Vit D   Yes [provider]  Cholecalciferol (VITAMIN D-3) 25 MCG (1000 UT) CAPS Take 1,000 Units by mouth daily.  Yes [provider]  escitalopram (LEXAPRO) 10 MG tablet TAKE 1 TABLET EVERY DAY 09/04/20  Yes Carles Collet M, PA-C  ferrous gluconate (FERGON) 324 MG tablet Take 1 tablet (324 mg total) by mouth 2 (two) times daily with a meal. 07/25/20  Yes Earlie Server, MD  hydrochlorothiazide (HYDRODIURIL) 12.5 MG tablet TAKE 1 TABLET EVERY DAY 01/27/20  Yes Terrilee Croak, Adriana M, PA-C  lisinopril (ZESTRIL) 20 MG tablet TAKE 1 TABLET EVERY DAY Patient taking differently: Take 20 mg by mouth daily.  09/01/19  Yes Carles Collet M, PA-C  omeprazole  (PRILOSEC) 20 MG capsule TAKE 1 CAPSULE EVERY DAY 09/05/20  Yes Pollak, Adriana M, PA-C  potassium chloride SA (KLOR-CON) 20 MEQ tablet TAKE 1 TABLET EVERY DAY Patient taking differently: Take 20 mEq by mouth daily.  10/18/19  Yes Trinna Post, PA-C  traZODone (DESYREL) 50 MG tablet TAKE 1 TABLET EVERY DAY 07/26/20  Yes Carles Collet M, PA-C  warfarin (COUMADIN) 1 MG tablet Take 1 tablet by mouth daily. 08/30/20  Yes [provider]  warfarin (COUMADIN) 2 MG tablet Take 2 mg by mouth as directed. 1.5 mg x 5 days and 44m x 2 days 11/16/19  Yes [provider]    Family History  Problem Relation Age of Onset  . Heart disease Father   . Hypertension Sister   . Cancer Mother        cervical  . Cancer Other        ovarian,liver,colon cancer-no family member listed  . Osteoporosis Paternal Aunt   . Osteoporosis Paternal Grandmother      Social History   Tobacco Use  . Smoking status: Never Smoker  . Smokeless tobacco: Never Used  Vaping Use  . Vaping Use: Never used  Substance Use Topics  . Alcohol use: No  . Drug use: No    Allergies as of 10/24/2020 - Review Complete 10/24/2020  Allergen Reaction Noted  . Taxotere [docetaxel] Itching and Rash 04/12/2015  . Sulfa antibiotics Hives 03/28/2013    Review of Systems:    All systems reviewed and negative except where noted in HPI.   Physical Exam:  BP (!) 156/76   Pulse 87   Temp 98.3 F (36.8 C) (Oral)   Ht _0  (1.626 m)   Wt 228 lb (103.4 kg)   BMI 39.14 kg/m  No LMP recorded. Patient has had a hysterectomy. Psych:  Alert and cooperative. Normal mood and affect. General:   Alert,  Well-developed, well-nourished, pleasant and cooperative in NAD Head:  Normocephalic and atraumatic. Eyes:  Sclera clear, no icterus.   Conjunctiva pink. Ears:  Normal auditory acuity. Nose:  No deformity, discharge, or lesions. Mouth:  No deformity or lesions,oropharynx pink & moist. Neck:  Supple; no masses or  thyromegaly. Abdomen:  Normal bowel sounds.  No bruits.  Soft, non-tender and non-distended without masses, hepatosplenomegaly or hernias noted.  No guarding or rebound tenderness.    Msk:  Symmetrical without gross deformities. Good, equal movement & strength bilaterally. Pulses:  Normal pulses noted. Extremities:  No clubbing or edema.  No cyanosis. Neurologic:  Alert and oriented x3;  grossly normal neurologically. Skin:  Intact without significant lesions or rashes. No jaundice. Lymph Nodes:  No significant cervical adenopathy. Psych:  Alert and cooperative. Normal mood and affect.   Labs: CBC    Component Value Date/Time   WBC 9.6 10/22/2020 1101   RBC 4.60 10/22/2020 1101   HGB 13.7 10/22/2020 1101   HGB 12.6  08/09/2019 1112   HCT 39.7 10/22/2020 1101   HCT 39.4 08/09/2019 1112   PLT 203 10/22/2020 1101   PLT 229 08/09/2019 1112   MCV 86.3 10/22/2020 1101   MCV 82 08/09/2019 1112   MCV 86 12/20/2014 1100   MCH 29.8 10/22/2020 1101   MCHC 34.5 10/22/2020 1101   RDW 15.6 (H) 10/22/2020 1101   RDW 15.8 (H) 08/09/2019 1112   RDW 15.5 (H) 12/20/2014 1100   LYMPHSABS 1.6 10/22/2020 1101   LYMPHSABS 1.8 08/09/2019 1112   LYMPHSABS 1.7 12/20/2014 1100   MONOABS 0.7 10/22/2020 1101   MONOABS 0.7 12/20/2014 1100   EOSABS 0.2 10/22/2020 1101   EOSABS 0.1 08/09/2019 1112   EOSABS 0.1 12/20/2014 1100   BASOSABS 0.0 10/22/2020 1101   BASOSABS 0.1 08/09/2019 1112   BASOSABS 0.1 12/20/2014 1100   CMP     Component Value Date/Time   NA 139 10/22/2020 1101   NA 140 08/10/2020 1137   NA 142 12/20/2014 1100   K 3.5 10/22/2020 1101   K 3.5 12/20/2014 1100   CL 100 10/22/2020 1101   CL 101 12/20/2014 1100   CO2 30 10/22/2020 1101   CO2 32 12/20/2014 1100   GLUCOSE 165 (H) 10/22/2020 1101   GLUCOSE 116 (H) 12/20/2014 1100   BUN 12 10/22/2020 1101   BUN 12 08/10/2020 1137   BUN 13 12/20/2014 1100   CREATININE 1.05 (H) 10/22/2020 1101   CREATININE 0.86 12/20/2014 1100    CALCIUM 8.8 (L) 10/22/2020 1101   CALCIUM 8.9 12/20/2014 1100   PROT 7.5 10/22/2020 1101   PROT 7.1 08/10/2020 1137   PROT 7.4 12/20/2014 1100   ALBUMIN 3.5 10/22/2020 1101   ALBUMIN 3.9 08/10/2020 1137   ALBUMIN 3.4 12/20/2014 1100   AST 19 10/22/2020 1101   AST 16 12/20/2014 1100   ALT 13 10/22/2020 1101   ALT 21 12/20/2014 1100   ALKPHOS 68 10/22/2020 1101   ALKPHOS 73 12/20/2014 1100   BILITOT 0.9 10/22/2020 1101   BILITOT 0.4 08/10/2020 1137   BILITOT 0.5 12/20/2014 1100   GFRNONAA 57 (L) 10/22/2020 1101   GFRNONAA >60 12/20/2014 1100   GFRNONAA >60 06/15/2014 1020   GFRAA 75 08/10/2020 1137   GFRAA >60 12/20/2014 1100   GFRAA >60 06/15/2014 1020    Imaging Studies: No results found.  Assessment and Plan:   Erin Romero is a 69 y.o. y/o female has been referred for iron deficiency anemia  I would recommend EGD and colonoscopy for evaluation of iron deficiency anemia  Patient states she would like to go home and talk with her family as to when they are available to bring her for her procedures  She states she will call us after discussing with them  She is agreeable to scheduling a phone follow-up in the next upcoming weeks to ensure she is able to schedule her procedures at that time if she has not called [place] by then  Globus sensation likely due to underlying reflux  Patient educated extensively on acid reflux lifestyle modification, including buying a bed wedge, not eating 3 hrs before bedtime, diet modifications, and handout given for the same.     Dr Erin [place]  Speech recognition software was used to dictate the above note.

## 2020-10-24 NOTE — Patient Instructions (Addendum)
WE WILL BE CALLING YOU WITH INSTRUCTIONS ON WHAT TO DO WITH YOUR COUMADIN AS OF WHEN TO HOLD AND RESTART IT.  Medication to prepare for procedures:  Take 1 bottle at 5 PM followed by five 8 oz cups of water and repeat 5 hours before procedure.

## 2020-10-25 DIAGNOSIS — I10 Essential (primary) hypertension: Secondary | ICD-10-CM | POA: Diagnosis not present

## 2020-10-25 DIAGNOSIS — E7800 Pure hypercholesterolemia, unspecified: Secondary | ICD-10-CM | POA: Diagnosis not present

## 2020-10-25 DIAGNOSIS — I4891 Unspecified atrial fibrillation: Secondary | ICD-10-CM | POA: Diagnosis not present

## 2020-10-25 DIAGNOSIS — Z6841 Body Mass Index (BMI) 40.0 and over, adult: Secondary | ICD-10-CM | POA: Diagnosis not present

## 2020-10-25 NOTE — Telephone Encounter (Signed)
We received patient's clearances. Called patient and let her know that she is to hold her Coumadin 5 days prior to her procedure and restart ASAP after procedure. Patient understood and had no further questions. Patient wanted her procedure to be done sooner, therefore, it was changed to 10/31/2020.

## 2020-10-29 ENCOUNTER — Other Ambulatory Visit: Payer: Self-pay

## 2020-10-29 ENCOUNTER — Other Ambulatory Visit
Admission: RE | Admit: 2020-10-29 | Discharge: 2020-10-29 | Disposition: A | Discharge status: Home / Self Care | Payer: Medicare HMO | Source: Ambulatory Visit | Attending: Gastroenterology | Admitting: Gastroenterology

## 2020-10-29 DIAGNOSIS — Z01818 Encounter for other preprocedural examination: Secondary | ICD-10-CM | POA: Insufficient documentation

## 2020-10-29 DIAGNOSIS — Z20822 Contact with and (suspected) exposure to covid-19: Secondary | ICD-10-CM | POA: Insufficient documentation

## 2020-10-29 DIAGNOSIS — I4891 Unspecified atrial fibrillation: Secondary | ICD-10-CM | POA: Diagnosis not present

## 2020-10-30 LAB — SARS CORONAVIRUS 2 (TAT 6-24 HRS): SARS Coronavirus 2: NEGATIVE

## 2020-10-31 ENCOUNTER — Other Ambulatory Visit: Payer: Self-pay

## 2020-10-31 ENCOUNTER — Ambulatory Visit: Payer: Medicare HMO | Admitting: Anesthesiology

## 2020-10-31 ENCOUNTER — Encounter: Payer: Self-pay | Admitting: Gastroenterology

## 2020-10-31 ENCOUNTER — Encounter
Admission: RE | Disposition: A | Discharge status: Home / Self Care | Payer: Self-pay | Source: Home / Self Care | Attending: Gastroenterology

## 2020-10-31 ENCOUNTER — Ambulatory Visit
Admission: RE | Admit: 2020-10-31 | Discharge: 2020-10-31 | Disposition: A | Discharge status: Home / Self Care | Payer: Medicare HMO | Attending: Gastroenterology | Admitting: Gastroenterology

## 2020-10-31 DIAGNOSIS — Z8049 Family history of malignant neoplasm of other genital organs: Secondary | ICD-10-CM | POA: Insufficient documentation

## 2020-10-31 DIAGNOSIS — D126 Benign neoplasm of colon, unspecified: Secondary | ICD-10-CM | POA: Diagnosis not present

## 2020-10-31 DIAGNOSIS — I890 Lymphedema, not elsewhere classified: Secondary | ICD-10-CM | POA: Diagnosis not present

## 2020-10-31 DIAGNOSIS — K222 Esophageal obstruction: Secondary | ICD-10-CM | POA: Diagnosis not present

## 2020-10-31 DIAGNOSIS — K5730 Diverticulosis of large intestine without perforation or abscess without bleeding: Secondary | ICD-10-CM | POA: Diagnosis not present

## 2020-10-31 DIAGNOSIS — K644 Residual hemorrhoidal skin tags: Secondary | ICD-10-CM | POA: Insufficient documentation

## 2020-10-31 DIAGNOSIS — Z7901 Long term (current) use of anticoagulants: Secondary | ICD-10-CM | POA: Diagnosis not present

## 2020-10-31 DIAGNOSIS — D125 Benign neoplasm of sigmoid colon: Secondary | ICD-10-CM | POA: Insufficient documentation

## 2020-10-31 DIAGNOSIS — Z8041 Family history of malignant neoplasm of ovary: Secondary | ICD-10-CM | POA: Diagnosis not present

## 2020-10-31 DIAGNOSIS — Z888 Allergy status to other drugs, medicaments and biological substances status: Secondary | ICD-10-CM | POA: Diagnosis not present

## 2020-10-31 DIAGNOSIS — Z882 Allergy status to sulfonamides status: Secondary | ICD-10-CM | POA: Diagnosis not present

## 2020-10-31 DIAGNOSIS — R1310 Dysphagia, unspecified: Secondary | ICD-10-CM | POA: Diagnosis not present

## 2020-10-31 DIAGNOSIS — D509 Iron deficiency anemia, unspecified: Secondary | ICD-10-CM | POA: Diagnosis not present

## 2020-10-31 DIAGNOSIS — K317 Polyp of stomach and duodenum: Secondary | ICD-10-CM | POA: Insufficient documentation

## 2020-10-31 DIAGNOSIS — K648 Other hemorrhoids: Secondary | ICD-10-CM | POA: Diagnosis not present

## 2020-10-31 DIAGNOSIS — D131 Benign neoplasm of stomach: Secondary | ICD-10-CM | POA: Diagnosis not present

## 2020-10-31 DIAGNOSIS — K2950 Unspecified chronic gastritis without bleeding: Secondary | ICD-10-CM | POA: Diagnosis not present

## 2020-10-31 DIAGNOSIS — Z8249 Family history of ischemic heart disease and other diseases of the circulatory system: Secondary | ICD-10-CM | POA: Insufficient documentation

## 2020-10-31 DIAGNOSIS — K635 Polyp of colon: Secondary | ICD-10-CM

## 2020-10-31 DIAGNOSIS — K219 Gastro-esophageal reflux disease without esophagitis: Secondary | ICD-10-CM | POA: Insufficient documentation

## 2020-10-31 DIAGNOSIS — K3189 Other diseases of stomach and duodenum: Secondary | ICD-10-CM | POA: Insufficient documentation

## 2020-10-31 DIAGNOSIS — I4891 Unspecified atrial fibrillation: Secondary | ICD-10-CM | POA: Insufficient documentation

## 2020-10-31 DIAGNOSIS — Z79899 Other long term (current) drug therapy: Secondary | ICD-10-CM | POA: Insufficient documentation

## 2020-10-31 DIAGNOSIS — I10 Essential (primary) hypertension: Secondary | ICD-10-CM | POA: Diagnosis not present

## 2020-10-31 DIAGNOSIS — Z853 Personal history of malignant neoplasm of breast: Secondary | ICD-10-CM | POA: Diagnosis not present

## 2020-10-31 DIAGNOSIS — K649 Unspecified hemorrhoids: Secondary | ICD-10-CM | POA: Diagnosis not present

## 2020-10-31 DIAGNOSIS — K449 Diaphragmatic hernia without obstruction or gangrene: Secondary | ICD-10-CM | POA: Insufficient documentation

## 2020-10-31 DIAGNOSIS — Z800 Family history of malignant neoplasm of digestive organs: Secondary | ICD-10-CM | POA: Insufficient documentation

## 2020-10-31 DIAGNOSIS — D123 Benign neoplasm of transverse colon: Secondary | ICD-10-CM | POA: Diagnosis not present

## 2020-10-31 DIAGNOSIS — Z8262 Family history of osteoporosis: Secondary | ICD-10-CM | POA: Insufficient documentation

## 2020-10-31 DIAGNOSIS — E7800 Pure hypercholesterolemia, unspecified: Secondary | ICD-10-CM | POA: Diagnosis not present

## 2020-10-31 HISTORY — PX: ESOPHAGOGASTRODUODENOSCOPY (EGD) WITH PROPOFOL: SHX5813

## 2020-10-31 HISTORY — PX: COLONOSCOPY WITH PROPOFOL: SHX5780

## 2020-10-31 SURGERY — ESOPHAGOGASTRODUODENOSCOPY (EGD) WITH PROPOFOL
Anesthesia: General

## 2020-10-31 MED ORDER — ACETAMINOPHEN 325 MG PO TABS: 650.0000 mg | ORAL_TABLET | Freq: Four times a day (QID) | ORAL | Status: DC | PRN

## 2020-10-31 MED ORDER — PROPOFOL 500 MG/50ML IV EMUL
INTRAVENOUS | Status: AC
  Filled 2020-10-31: qty 50

## 2020-10-31 MED ORDER — PROPOFOL 500 MG/50ML IV EMUL
INTRAVENOUS | Status: DC | PRN
  Administered 2020-10-31: 150 ug/kg/min via INTRAVENOUS

## 2020-10-31 MED ORDER — ACETAMINOPHEN 500 MG PO TABS
ORAL_TABLET | ORAL | Status: AC
  Administered 2020-10-31: 1000 mg via ORAL
  Filled 2020-10-31: qty 2

## 2020-10-31 MED ORDER — PHENYLEPHRINE HCL (PRESSORS) 10 MG/ML IV SOLN
INTRAVENOUS | Status: DC | PRN
  Administered 2020-10-31: 100 ug via INTRAVENOUS

## 2020-10-31 MED ORDER — ACETAMINOPHEN 500 MG PO TABS: 1000.0000 mg | ORAL_TABLET | Freq: Four times a day (QID) | ORAL | Status: DC | PRN

## 2020-10-31 MED ORDER — LIDOCAINE HCL (CARDIAC) PF 100 MG/5ML IV SOSY
PREFILLED_SYRINGE | INTRAVENOUS | Status: DC | PRN
  Administered 2020-10-31: 80 mg via INTRAVENOUS

## 2020-10-31 MED ORDER — SODIUM CHLORIDE 0.9 % IV SOLN: INTRAVENOUS | Status: DC

## 2020-10-31 MED ORDER — PROPOFOL 10 MG/ML IV BOLUS
INTRAVENOUS | Status: DC | PRN
  Administered 2020-10-31 (×2): 20 mg via INTRAVENOUS
  Administered 2020-10-31: 50 mg via INTRAVENOUS

## 2020-10-31 NOTE — Anesthesia Procedure Notes (Signed)
Date/Time: 10/31/2020 10:15 AM Performed by: Allean Found, CRNA Pre-anesthesia Checklist: Patient identified, Emergency Drugs available, Suction available, Patient being monitored and Timeout performed Patient Re-evaluated:Patient Re-evaluated prior to induction Oxygen Delivery Method: Nasal cannula Placement Confirmation: positive ETCO2

## 2020-10-31 NOTE — OR Nursing (Signed)
C/O H/A. TYLENOL ADM. AND PATIENT ATE CRACKERS. HEADACHE MUCH BETTER. PAIN LEVEL 5

## 2020-10-31 NOTE — H&P (Signed)
Erin Antigua, MD [address], [place], [place], [place], [address] [address], [place], [place], [place], [address] Phone: [phone]  Fax: [phone]  Primary Care Physician:  Trinna Post, PA-C   Pre-Procedure History & Physical: HPI:  Jensyn Shave is a 69 y.o. female is here for a colonoscopy and EGD.   Past Medical History:  Diagnosis Date  . Anxiety   . Atrial fibrillation ([place]) 2014  . Breast cancer ([place]) 2014   right breast  . Dysrhythmia   . Fibrocystic breast disease 2005  . GERD (gastroesophageal reflux disease)   . Hypertension 2005  . Lump or mass in breast 2014   right mastectomy  . Malignant neoplasm of central portion of female breast ([place]) 01/24/2013   Stage II,T2, N1a. ER/PR 90%, HER-2/neu: Not overexpressed. Mastectomy with sentinel node biopsy. 2 sentinel nodes negative, non-sentinel node w/ 5 mm macrometastatic foci in non-sentinel node.  Received post mastectomy radiation.     Past Surgical History:  Procedure Laterality Date  . ABDOMINAL HYSTERECTOMY  1999  . BREAST BIOPSY Left 2015   BENIGN FIBROTIC BREAST TISSUE WITH CLUSTERED APOCRINE  . BREAST BIOPSY Left 11/02/2019   affirm bx, x marker, path pending  . BREAST SURGERY Right 2005,2014   biopsy  . BREAST SURGERY Right 2014   mastectomy  . COLONOSCOPY  2002  . COLONOSCOPY WITH PROPOFOL N/A 10/16/2015   Procedure: COLONOSCOPY WITH PROPOFOL;  Surgeon: Robert Bellow, MD;  Location: [facility] ENDOSCOPY;  Service: Endoscopy;  Laterality: N/A;  . KNEE SURGERY Left 2005  . MASTECTOMY Right 2014   radiation chemo  . SIMPLE MASTECTOMY WITH AXILLARY SENTINEL NODE BIOPSY Left 11/30/2019   Procedure: SIMPLE MASTECTOMY WITH AXILLARY SENTINEL NODE BIOPSY;  Surgeon: Robert Bellow, MD;  Location: [facility] ORS;  Service: General;  Laterality: Left;  . thumb surg Right 2004  . TONSILLECTOMY     age of 3    Prior to Admission medications   Medication Sig Start Date End Date Taking?  Authorizing Provider  amLODipine (NORVASC) 2.5 MG tablet TAKE 1 TABLET EVERY DAY 10/15/20  Yes Carles Collet M, PA-C  atorvastatin (LIPITOR) 10 MG tablet TAKE 1 TABLET EVERY DAY 06/26/20  Yes Pollak, Adriana M, PA-C  bisoprolol-hydrochlorothiazide (ZIAC) 10-6.25 MG tablet Take 1 tablet by mouth daily. 02/04/18  Yes Trinna Post, PA-C  Calcium Carb-Cholecalciferol (OYSTER SHELL CALCIUM + D PO) Take 1 tablet by mouth daily. 642m calcium plus 20 mg Vit D   Yes [provider]  Cholecalciferol (VITAMIN D-3) 25 MCG (1000 UT) CAPS Take 1,000 Units by mouth daily.    Yes [provider]  escitalopram (LEXAPRO) 10 MG tablet TAKE 1 TABLET EVERY DAY 09/04/20  Yes PCarles ColletM, PA-C  ferrous gluconate (FERGON) 324 MG tablet Take 1 tablet (324 mg total) by mouth 2 (two) times daily with a meal. 07/25/20  Yes YEarlie Server MD  hydrochlorothiazide (HYDRODIURIL) 12.5 MG tablet TAKE 1 TABLET EVERY DAY 01/27/20  Yes PTerrilee Croak Adriana M, PA-C  lisinopril (ZESTRIL) 20 MG tablet TAKE 1 TABLET EVERY DAY Patient taking differently: Take 20 mg by mouth daily.  09/01/19  Yes PCarles ColletM, PA-C  omeprazole (PRILOSEC) 20 MG capsule TAKE 1 CAPSULE EVERY DAY 09/05/20  Yes Pollak, Adriana M, PA-C  potassium chloride SA (KLOR-CON) 20 MEQ tablet TAKE 1 TABLET EVERY DAY Patient taking differently: Take 20 mEq by mouth daily.  10/18/19  Yes PTrinna Post PA-C  traZODone (DESYREL) 50 MG tablet  TAKE 1 TABLET EVERY DAY 07/26/20  Yes Carles Collet M, PA-C  acetaminophen (TYLENOL) 500 MG tablet Take 1,500 mg by mouth every 4 (four) hours as needed for moderate pain.     [provider]  warfarin (COUMADIN) 1 MG tablet Take 1 tablet by mouth daily. 08/30/20   [provider]  warfarin (COUMADIN) 2 MG tablet Take 2 mg by mouth as directed. 1.5 mg x 5 days and 61m x 2 days 11/16/19   [provider]    Allergies as of 10/24/2020 - Review Complete 10/24/2020  Allergen Reaction Noted  .  Taxotere [docetaxel] Itching and Rash 04/12/2015  . Sulfa antibiotics Hives 03/28/2013    Family History  Problem Relation Age of Onset  . Heart disease Father   . Hypertension Sister   . Cancer Mother        cervical  . Cancer Other        ovarian,liver,colon cancer-no family member listed  . Osteoporosis Paternal Aunt   . Osteoporosis Paternal Grandmother     Social History   Socioeconomic History  . Marital status: Widowed    Spouse name: EBeau Vanduzer . Number of children: 0  . Years of education: 177 . Highest education level: 12th grade  Occupational History  . Occupation: retired  Tobacco Use  . Smoking status: Never Smoker  . Smokeless tobacco: Never Used  Vaping Use  . Vaping Use: Never used  Substance and Sexual Activity  . Alcohol use: No  . Drug use: No  . Sexual activity: Not on file  Other Topics Concern  . Not on file  Social History Narrative  . Not on file   Social Determinants of Health   Financial Resource Strain: Low Risk   . Difficulty of Paying Living Expenses: Not hard at all  Food Insecurity: No Food Insecurity  . Worried About RCharity fundraiserin the Last Year: Never true  . Ran Out of Food in the Last Year: Never true  Transportation Needs: No Transportation Needs  . Lack of Transportation (Medical): No  . Lack of Transportation (Non-Medical): No  Physical Activity: Inactive  . Days of Exercise per Week: 0 days  . Minutes of Exercise per Session: 0 min  Stress: No Stress Concern Present  . Feeling of Stress : Not at all  Social Connections: Socially Isolated  . Frequency of Communication with Friends and Family: Twice a week  . Frequency of Social Gatherings with Friends and Family: Never  . Attends Religious Services: Never  . Active Member of Clubs or Organizations: No  . Attends CArchivistMeetings: Never  . Marital Status: Widowed  Intimate Partner Violence: Not At Risk  . Fear of Current or Ex-Partner: No  .  Emotionally Abused: No  . Physically Abused: No  . Sexually Abused: No    Review of Systems: See HPI, otherwise negative ROS  Physical Exam: BP (!) 161/83   Pulse (!) 145   Temp (!) 96.8 F (36 C) (Temporal)   Resp 17   Ht 5' 4"  (1.626 m)   Wt 103.4 kg   SpO2 100%   BMI 39.14 kg/m  General:   Alert,  pleasant and cooperative in NAD Head:  Normocephalic and atraumatic. Neck:  Supple; no masses or thyromegaly. Lungs:  Clear throughout to auscultation, normal respiratory effort.    Heart:  +S1, +S2, Regular rate and rhythm, No edema. Abdomen:  Soft, nontender and nondistended. Normal bowel sounds,  without guarding, and without rebound.   Neurologic:  Alert and  oriented x4;  grossly normal neurologically.  Impression/Plan: [name] is here for a colonoscopy and EGD for iron deficiency anemia  Risks, benefits, limitations, and alternatives regarding the procedures have been reviewed with the patient.  Questions have been answered.  All parties agreeable.   [name], MD  10/31/2020, 9:30 AM

## 2020-10-31 NOTE — Transfer of Care (Signed)
Immediate Anesthesia Transfer of Care Note  Patient: Erin Romero  Procedure(s) Performed: ESOPHAGOGASTRODUODENOSCOPY (EGD) WITH PROPOFOL (N/A ) COLONOSCOPY WITH PROPOFOL (N/A )  Patient Location: PACU  Anesthesia Type:General  Level of Consciousness: sedated  Airway & Oxygen Therapy: Patient Spontanous Breathing and Patient connected to nasal cannula oxygen  Post-op Assessment: Report given to RN and Post -op Vital signs reviewed and stable  Post vital signs: Reviewed and stable  Last Vitals:  Vitals Value Taken Time  BP 110/52 10/31/20 1111  Temp    Pulse 71 10/31/20 1114  Resp 16 10/31/20 1114  SpO2 99 % 10/31/20 1114  Vitals shown include unvalidated device data.  Last Pain:  Vitals:   10/31/20 0903  TempSrc: Temporal         Complications: No complications documented.

## 2020-10-31 NOTE — Op Note (Signed)
[facility] Gastroenterology Patient Name: Erin Romero Procedure Date: 10/31/2020 10:10 AM MRN: [id number] Account #: [id number] Date of Birth: 26-Aug-1951 Admit Type: Outpatient Age: 69 Room: [facility] ENDO ROOM 4 Gender: Female Note Status: Finalized Procedure:             Colonoscopy Indications:           Iron deficiency anemia Providers:             Adamary Savary B. Bonna Gains MD, MD Referring MD:          Wendee Beavers. Terrilee Croak (Referring MD) Medicines:             Monitored Anesthesia Care Complications:         No immediate complications. Procedure:             Pre-Anesthesia Assessment:                        - ASA Grade Assessment: II - A patient with mild                         systemic disease.                        - Prior to the procedure, a History and Physical was                         performed, and patient medications, allergies and                         sensitivities were reviewed. The patient's tolerance                         of previous anesthesia was reviewed.                        - The risks and benefits of the procedure and the                         sedation options and risks were discussed with the                         patient. All questions were answered and informed                         consent was obtained.                        - Patient identification and proposed procedure were                         verified prior to the procedure by the physician, the                         nurse, the anesthesiologist, the anesthetist and the                         technician. The procedure was verified in the                         procedure room.  After obtaining informed consent, the colonoscope was                         passed under direct vision. Throughout the procedure,                         the patient's blood pressure, pulse, and oxygen                         saturations were monitored continuously. The                          Colonoscope was introduced through the anus and                         advanced to the the cecum, identified by appendiceal                         orifice and ileocecal valve. The colonoscopy was                         performed with ease. The patient tolerated the                         procedure well. The quality of the bowel preparation                         was good. Findings:      The perianal exam findings include non-thrombosed external hemorrhoids.      A 4 mm polyp was found in the transverse colon. The polyp was sessile.       The polyp was removed with a jumbo cold forceps. Resection and retrieval       were complete.      A 4 mm polyp was found in the sigmoid colon. The polyp was sessile. The       polyp was removed with a jumbo cold forceps. Resection and retrieval       were complete.      A few diverticula were found in the sigmoid colon.      The exam was otherwise without abnormality.      The rectum, sigmoid colon, descending colon, transverse colon, ascending       colon and cecum appeared normal.      Non-bleeding internal hemorrhoids were found during retroflexion.       [Size/Grade]. Impression:            - Non-thrombosed external hemorrhoids found on                         perianal exam.                        - One 4 mm polyp in the transverse colon, removed with                         a jumbo cold forceps. Resected and retrieved.                        - One 4 mm polyp in the sigmoid colon, removed with a  jumbo cold forceps. Resected and retrieved.                        - Diverticulosis in the sigmoid colon.                        - The examination was otherwise normal.                        - The rectum, sigmoid colon, descending colon,                         transverse colon, ascending colon and cecum are normal.                        - Non-bleeding internal hemorrhoids. Recommendation:        -  Discharge patient to home (with escort).                        - Advance diet as tolerated.                        - Continue present medications.                        - Await pathology results.                        - Repeat colonoscopy date to be determined after                         pending pathology results are reviewed.                        - The findings and recommendations were discussed with                         the patient.                        - The findings and recommendations were discussed with                         the patient's family.                        - Return to primary care physician as previously                         scheduled.                        - High fiber diet. Procedure Code(s):     --- Professional ---                        [phone], Colonoscopy, flexible; with biopsy, single or                         multiple Diagnosis Code(s):     --- Professional ---  K63.5, Polyp of colon                        D50.9, Iron deficiency anemia, unspecified CPT copyright 2019 American Medical Association. All rights reserved. The codes documented in this report are preliminary and upon coder review may  be revised to meet current compliance requirements.  Vonda Antigua, MD Margretta Sidle B. Bonna Gains MD, MD 10/31/2020 11:09:57 AM This report has been signed electronically. Number of Addenda: 0 Note Initiated On: 10/31/2020 10:10 AM Scope Withdrawal Time: 0 hours 16 minutes 6 seconds  Total Procedure Duration: 0 hours 18 minutes 56 seconds  Estimated Blood Loss:  Estimated blood loss: none.      [facility]

## 2020-10-31 NOTE — Op Note (Signed)
[facility] Gastroenterology Patient Name: Erin Romero Procedure Date: 10/31/2020 10:12 AM MRN: [id number] Account #: [id number] Date of Birth: 08-Nov-1951 Admit Type: Outpatient Age: 69 Room: [facility] ENDO ROOM 4 Gender: Female Note Status: Finalized Procedure:             Upper GI endoscopy Indications:           Iron deficiency anemia, Dysphagia Providers:             Ephriam Turman B. Bonna Gains MD, MD Referring MD:          Wendee Beavers. Terrilee Croak (Referring MD) Medicines:             Monitored Anesthesia Care Complications:         No immediate complications. Procedure:             Pre-Anesthesia Assessment:                        - The risks and benefits of the procedure and the                         sedation options and risks were discussed with the                         patient. All questions were answered and informed                         consent was obtained.                        - Patient identification and proposed procedure were                         verified prior to the procedure.                        - ASA Grade Assessment: II - A patient with mild                         systemic disease.                        After obtaining informed consent, the endoscope was                         passed under direct vision. Throughout the procedure,                         the patient's blood pressure, pulse, and oxygen                         saturations were monitored continuously. The Endoscope                         was introduced through the mouth, and advanced to the                         second part of duodenum. The upper GI endoscopy was                         accomplished  with ease. The patient tolerated the                         procedure well. Findings:      A mild Schatzki ring was found at the gastroesophageal junction. A TTS       dilator was passed through the scope. Dilation with a 15-16.5-18 mm       balloon dilator was performed to  16.5 mm. Good heme effect noted after       dilation to 16.5 mm.      The exam of the esophagus was otherwise normal.      A single 5 mm sessile polyp with no bleeding and no stigmata of recent       bleeding was found in the gastric body. Biopsies were taken with a cold       forceps for histology. This polyp was erythematous.      A few 3 to 4 mm sessile polyps with no bleeding and no stigmata of       recent bleeding were found in the gastric body. Biopsies were taken with       a cold forceps for histology.      A small hiatal hernia was present.      The exam of the stomach was otherwise normal.      Nodular mucosa was found in the duodenal bulb. Biopsies were taken with       a cold forceps for histology.      Patchy mild mucosal changes characterized by white specks were found in       the second portion of the duodenum. Biopsies for histology were taken       with a cold forceps for evaluation of celiac disease.      The exam of the duodenum was otherwise normal. Impression:            - Mild Schatzki ring. Dilated.                        - A single gastric polyp. Biopsied.                        - A few gastric polyps. Biopsied.                        - Small hiatal hernia.                        - Nodular mucosa in the duodenal bulb. Biopsied.                        - Mucosal changes in the duodenum. Biopsied. Recommendation:        - Await pathology results.                        - Follow an antireflux regimen.                        - Return to my office as previously scheduled.                        - Continue present medications.                        -  The findings and recommendations were discussed with                         the patient.                        - The findings and recommendations were discussed with                         the patient's family. Procedure Code(s):     --- Professional ---                        [id number], Esophagogastroduodenoscopy,  flexible,                         transoral; with transendoscopic balloon dilation of                         esophagus (less than 30 mm diameter)                        43239, 59, Esophagogastroduodenoscopy, flexible,                         transoral; with biopsy, single or multiple Diagnosis Code(s):     --- Professional ---                        K22.2, Esophageal obstruction                        K31.7, Polyp of stomach and duodenum                        K44.9, Diaphragmatic hernia without obstruction or                         gangrene                        K31.89, Other diseases of stomach and duodenum                        D50.9, Iron deficiency anemia, unspecified                        R13.10, Dysphagia, unspecified CPT copyright 2019 American Medical Association. All rights reserved. The codes documented in this report are preliminary and upon coder review may  be revised to meet current compliance requirements.  Vonda Antigua, MD Margretta Sidle B. Bonna Gains MD, MD 10/31/2020 10:43:31 AM This report has been signed electronically. Number of Addenda: 0 Note Initiated On: 10/31/2020 10:12 AM Estimated Blood Loss:  Estimated blood loss: none.      [facility]

## 2020-10-31 NOTE — Anesthesia Preprocedure Evaluation (Signed)
Anesthesia Evaluation  Patient identified by MRN, date of birth, ID band Patient awake    Reviewed: Allergy & Precautions, NPO status , Patient's Chart, lab work & pertinent test results  History of Anesthesia Complications Negative for: history of anesthetic complications  Airway Mallampati: III  TM Distance: >3 FB Neck ROM: Full    Dental  (+) Poor Dentition   Pulmonary neg pulmonary ROS, neg sleep apnea, neg COPD,    breath sounds clear to auscultation- rhonchi (-) wheezing      Cardiovascular Exercise Tolerance: Good hypertension, (-) CAD, (-) Past MI, (-) Cardiac Stents and (-) CABG + dysrhythmias Atrial Fibrillation  Rhythm:Regular Rate:Normal - Systolic murmurs and - Diastolic murmurs    Neuro/Psych  Headaches, neg Seizures PSYCHIATRIC DISORDERS Anxiety Depression    GI/Hepatic Neg liver ROS, GERD  ,  Endo/Other  negative endocrine ROSneg diabetes  Renal/GU Renal InsufficiencyRenal disease     Musculoskeletal  (+) Arthritis ,   Abdominal (+) + obese,   Peds  Hematology negative hematology ROS (+)   Anesthesia Other Findings Past Medical History: No date: Anxiety 2014: Atrial fibrillation ([place]) 2014: Breast cancer ([place])     Comment:  right breast No date: Dysrhythmia 2005: Fibrocystic breast disease No date: GERD (gastroesophageal reflux disease) 2005: Hypertension 2014: Lump or mass in breast     Comment:  right mastectomy 01/24/2013: Malignant neoplasm of central portion of female breast  ([place])     Comment:  Stage II,T2, N1a. ER/PR 90%, HER-2/neu: Not               overexpressed. Mastectomy with sentinel node biopsy. 2               sentinel nodes negative, non-sentinel node w/ 5 mm               macrometastatic foci in non-sentinel node.  Received post              mastectomy radiation.    Reproductive/Obstetrics                             Anesthesia Physical Anesthesia  Plan  ASA: III  Anesthesia Plan: General   Post-op Pain Management:    Induction: Intravenous  PONV Risk Score and Plan: 2 and Propofol infusion  Airway Management Planned: Natural Airway  Additional Equipment:   Intra-op Plan:   Post-operative Plan:   Informed Consent: I have reviewed the patients History and Physical, chart, labs and discussed the procedure including the risks, benefits and alternatives for the proposed anesthesia with the patient or authorized representative who has indicated his/her understanding and acceptance.     Dental advisory given  Plan Discussed with: CRNA and Anesthesiologist  Anesthesia Plan Comments:         Anesthesia Quick Evaluation

## 2020-10-31 NOTE — Anesthesia Postprocedure Evaluation (Signed)
Anesthesia Post Note  Patient: Jaselle Pryer  Procedure(s) Performed: ESOPHAGOGASTRODUODENOSCOPY (EGD) WITH PROPOFOL (N/A ) COLONOSCOPY WITH PROPOFOL (N/A )  Patient location during evaluation: Endoscopy Anesthesia Type: General Level of consciousness: awake and alert and oriented Pain management: pain level controlled Vital Signs Assessment: post-procedure vital signs reviewed and stable Respiratory status: spontaneous breathing, nonlabored ventilation and respiratory function stable Cardiovascular status: blood pressure returned to baseline and stable Postop Assessment: no signs of nausea or vomiting Anesthetic complications: no   No complications documented.   Last Vitals:  Vitals:   10/31/20 1130 10/31/20 1150  BP: (!) 155/65 (!) 165/65  Pulse: 72 69  Resp: 13 14  Temp:    SpO2: 100% 99%    Last Pain:  Vitals:   10/31/20 0903  TempSrc: Temporal                 Leondre Taul

## 2020-11-01 ENCOUNTER — Encounter: Payer: Self-pay | Admitting: Gastroenterology

## 2020-11-06 LAB — SURGICAL PATHOLOGY

## 2020-11-07 ENCOUNTER — Telehealth: Payer: Self-pay

## 2020-11-07 ENCOUNTER — Other Ambulatory Visit: Payer: Self-pay

## 2020-11-07 ENCOUNTER — Encounter: Payer: Self-pay | Admitting: Gastroenterology

## 2020-11-07 DIAGNOSIS — D509 Iron deficiency anemia, unspecified: Secondary | ICD-10-CM

## 2020-11-07 NOTE — Telephone Encounter (Signed)
Called patient but she did not answer. Therefore, I will send her the form that I requested for her to hold and restart her Warfarin. Patient is scheduled to have her capsule study done on 12/27/2020, so she would have to hold her Warfarin on 12/22/2020 and restart ASAP per Dr. Ubaldo Glassing. This form will be in patient's Media.

## 2020-11-07 NOTE — Telephone Encounter (Signed)
Called patient to let her know about her colonoscopy results and Dr. Michele Mcalpine recommendations-please read below. Patient agreed on doing her small bowel capsule study but until after the holidays. I told her that I would schedule and call her back since she stated that she is not able to get into her MyChart account.  I had to send a blood thinner clearance since she is to hold her Coumadin for this. Once I get a response, I will notify the patient when to hold her warfarin and when to restart.

## 2020-11-07 NOTE — Telephone Encounter (Signed)
-----   Message from [name], MD sent at 11/07/2020 10:32 AM EST ----- Erin Romero please let the patient know, one of the colon polyps removed from her colon was precancerous.  I recommend repeat exam in 7 years for polyp surveillance.  However, since she will be 70 years of age at that time, it would be best for her to see [place] in clinic at that time prior to scheduling her colonoscopy.  Please set recall clinic visit at that time, just prior to her recall colonoscopy.  Given her iron deficiency anemia, I also recommend small bowel capsule study at this time

## 2020-11-12 ENCOUNTER — Other Ambulatory Visit: Payer: Self-pay | Admitting: Physician Assistant

## 2020-11-12 DIAGNOSIS — E7800 Pure hypercholesterolemia, unspecified: Secondary | ICD-10-CM

## 2020-11-29 DIAGNOSIS — I4891 Unspecified atrial fibrillation: Secondary | ICD-10-CM | POA: Diagnosis not present

## 2020-11-29 DIAGNOSIS — Z853 Personal history of malignant neoplasm of breast: Secondary | ICD-10-CM | POA: Diagnosis not present

## 2020-12-10 DIAGNOSIS — H40013 Open angle with borderline findings, low risk, bilateral: Secondary | ICD-10-CM | POA: Diagnosis not present

## 2020-12-11 DIAGNOSIS — I4891 Unspecified atrial fibrillation: Secondary | ICD-10-CM | POA: Diagnosis not present

## 2020-12-27 ENCOUNTER — Encounter: Payer: Self-pay | Admitting: Gastroenterology

## 2020-12-27 ENCOUNTER — Encounter: Payer: Self-pay | Admitting: Anesthesiology

## 2020-12-27 ENCOUNTER — Ambulatory Visit
Admission: RE | Admit: 2020-12-27 | Discharge: 2020-12-27 | Disposition: A | Discharge status: Home / Self Care | Payer: Medicare HMO | Attending: Gastroenterology | Admitting: Gastroenterology

## 2020-12-27 ENCOUNTER — Encounter
Admission: RE | Disposition: A | Discharge status: Home / Self Care | Payer: Self-pay | Source: Home / Self Care | Attending: Gastroenterology

## 2020-12-27 DIAGNOSIS — D509 Iron deficiency anemia, unspecified: Secondary | ICD-10-CM | POA: Diagnosis not present

## 2020-12-27 HISTORY — PX: GIVENS CAPSULE STUDY: SHX5432

## 2020-12-27 SURGERY — IMAGING PROCEDURE, GI TRACT, INTRALUMINAL, VIA CAPSULE

## 2020-12-27 MED ORDER — SODIUM CHLORIDE 0.9 % IV SOLN: INTRAVENOUS | Status: DC

## 2020-12-28 ENCOUNTER — Encounter: Payer: Self-pay | Admitting: Gastroenterology

## 2021-01-08 ENCOUNTER — Telehealth: Payer: Self-pay | Admitting: Gastroenterology

## 2021-01-08 DIAGNOSIS — D509 Iron deficiency anemia, unspecified: Secondary | ICD-10-CM

## 2021-01-08 NOTE — Telephone Encounter (Signed)
Patient calling for results from capsule study on 1.6.22.

## 2021-01-10 NOTE — Telephone Encounter (Signed)
Please advise 

## 2021-01-10 NOTE — Telephone Encounter (Signed)
Called patient to let her know that she is needing to have an abdominal x-ray to see if she had been able to pass the capsule. Patient was also informed that she is needing a follow up appointment with Dr. Bonna Gains to discuss abdominal x-ray resullts and lab results. Patient understood and had no further questions.

## 2021-01-14 ENCOUNTER — Other Ambulatory Visit: Payer: Self-pay

## 2021-01-14 ENCOUNTER — Ambulatory Visit
Admission: RE | Admit: 2021-01-14 | Discharge: 2021-01-14 | Disposition: A | Discharge status: Home / Self Care | Payer: Medicare HMO | Attending: Gastroenterology | Admitting: Gastroenterology

## 2021-01-14 ENCOUNTER — Ambulatory Visit
Admission: RE | Admit: 2021-01-14 | Discharge: 2021-01-14 | Disposition: A | Discharge status: Home / Self Care | Payer: Medicare HMO | Source: Ambulatory Visit | Attending: Gastroenterology | Admitting: Gastroenterology

## 2021-01-14 DIAGNOSIS — I4891 Unspecified atrial fibrillation: Secondary | ICD-10-CM | POA: Diagnosis not present

## 2021-01-14 DIAGNOSIS — D509 Iron deficiency anemia, unspecified: Secondary | ICD-10-CM

## 2021-01-14 DIAGNOSIS — K8020 Calculus of gallbladder without cholecystitis without obstruction: Secondary | ICD-10-CM | POA: Diagnosis not present

## 2021-01-17 ENCOUNTER — Encounter: Payer: Self-pay | Admitting: Gastroenterology

## 2021-01-21 ENCOUNTER — Other Ambulatory Visit: Payer: Self-pay

## 2021-01-28 ENCOUNTER — Other Ambulatory Visit: Payer: Self-pay | Admitting: Physician Assistant

## 2021-01-28 DIAGNOSIS — F419 Anxiety disorder, unspecified: Secondary | ICD-10-CM

## 2021-01-28 NOTE — Telephone Encounter (Signed)
Requested Prescriptions  Pending Prescriptions Disp Refills  . escitalopram (LEXAPRO) 10 MG tablet [Pharmacy Med Name: ESCITALOPRAM OXALATE 10 MG Tablet] 90 tablet 1    Sig: TAKE 1 TABLET EVERY DAY     Psychiatry:  Antidepressants - SSRI Passed - 01/28/2021  1:30 PM      Passed - Completed PHQ-2 or PHQ-9 in the last 360 days      Passed - Valid encounter within last 6 months    Recent Outpatient Visits          5 months ago Annual physical exam   [facility] Carles Collet M, [place]   11 months ago [place], Wendee Beavers, [place]   1 year ago Annual physical exam   [facility] Trinna Post, [place]   1 year ago Essential hypertension   [place], [place], [place]   2 years ago Osteoporosis, unspecified osteoporosis type, unspecified pathological fracture presence   [facility] [place], Wendee Beavers, [place]      Future Appointments            Tomorrow [name], MD [place]   In 1 week Trinna Post, PA-C [facility], [place]

## 2021-01-29 ENCOUNTER — Ambulatory Visit: Payer: Medicare HMO | Admitting: Gastroenterology

## 2021-01-29 ENCOUNTER — Telehealth: Payer: Self-pay | Admitting: Gastroenterology

## 2021-01-29 NOTE — Telephone Encounter (Signed)
Patient called to reschedule appointment.  She was confused about Virtual vs. Ofiice Visit and asked that we not charge her for No show

## 2021-02-08 ENCOUNTER — Ambulatory Visit: Payer: Self-pay | Admitting: Physician Assistant

## 2021-02-11 ENCOUNTER — Ambulatory Visit: Payer: Medicare HMO | Admitting: Gastroenterology

## 2021-02-11 DIAGNOSIS — I4891 Unspecified atrial fibrillation: Secondary | ICD-10-CM | POA: Diagnosis not present

## 2021-02-13 ENCOUNTER — Ambulatory Visit (INDEPENDENT_AMBULATORY_CARE_PROVIDER_SITE_OTHER): Payer: Medicare HMO | Admitting: Physician Assistant

## 2021-02-13 ENCOUNTER — Encounter: Payer: Self-pay | Admitting: Physician Assistant

## 2021-02-13 ENCOUNTER — Other Ambulatory Visit: Payer: Self-pay

## 2021-02-13 DIAGNOSIS — E7800 Pure hypercholesterolemia, unspecified: Secondary | ICD-10-CM

## 2021-02-13 DIAGNOSIS — I4891 Unspecified atrial fibrillation: Secondary | ICD-10-CM | POA: Diagnosis not present

## 2021-02-13 DIAGNOSIS — C50111 Malignant neoplasm of central portion of right female breast: Secondary | ICD-10-CM

## 2021-02-13 DIAGNOSIS — M810 Age-related osteoporosis without current pathological fracture: Secondary | ICD-10-CM

## 2021-02-13 DIAGNOSIS — F419 Anxiety disorder, unspecified: Secondary | ICD-10-CM

## 2021-02-13 DIAGNOSIS — Z170 Estrogen receptor positive status [ER+]: Secondary | ICD-10-CM

## 2021-02-13 DIAGNOSIS — I10 Essential (primary) hypertension: Secondary | ICD-10-CM

## 2021-02-13 MED ORDER — AMLODIPINE BESYLATE 2.5 MG PO TABS
2.5000 mg | ORAL_TABLET | Freq: Every day | ORAL | 1 refills | Status: DC
Start: 2021-02-13 — End: 2021-08-13

## 2021-02-13 MED ORDER — LISINOPRIL 20 MG PO TABS: 20.0000 mg | ORAL_TABLET | Freq: Every day | ORAL | 1 refills | Status: DC

## 2021-02-13 MED ORDER — ATORVASTATIN CALCIUM 10 MG PO TABS
10.0000 mg | ORAL_TABLET | Freq: Every day | ORAL | 1 refills | Status: DC
Start: 2021-02-13 — End: 2021-08-13

## 2021-02-13 MED ORDER — POTASSIUM CHLORIDE CRYS ER 20 MEQ PO TBCR: 20.0000 meq | EXTENDED_RELEASE_TABLET | Freq: Every day | ORAL | 1 refills | Status: DC

## 2021-02-13 NOTE — Progress Notes (Signed)
Established patient visit   Patient: Erin Romero   DOB: May 02, 1951   70 y.o. Female  MRN: [id number] Visit Date: 02/13/2021  Today's healthcare provider: Trinna Post, PA-C   Chief Complaint  Patient presents with  . Anxiety  I,Farzana Koci M Amerah Puleo,acting as a scribe for Trinna Post, PA-C.,have documented all relevant documentation on the behalf of Trinna Post, PA-C,as directed by  Trinna Post, PA-C while in the presence of Trinna Post, PA-C.  Subjective    HPI  Anxiety, Follow-up  She was last seen for anxiety 6 months ago. Changes made at last visit include continue current medication.   She reports good compliance with treatment. She reports good tolerance of treatment. She is not having side effects.   She feels her anxiety is moderate and Unchanged since last visit.  Symptoms: No chest pain No difficulty concentrating  No dizziness No fatigue  No feelings of losing control No insomnia  No irritable No palpitations  No panic attacks No racing thoughts  No shortness of breath No sweating  No tremors/shakes    GAD-7 Results GAD-7 Generalized Anxiety Disorder Screening Tool 02/13/2021  1. Feeling Nervous, Anxious, or on Edge 0  2. Not Being Able to Stop or Control Worrying 2  3. Worrying Too Much About Different Things 2  4. Trouble Relaxing 0  5. Being So Restless it's Hard To Sit Still 0  6. Becoming Easily Annoyed or Irritable 0  7. Feeling Afraid As If Something Awful Might Happen 0  Total GAD-7 Score 4  Difficulty At Work, Home, or Getting  Along With Others? Not difficult at all    PHQ-9 Scores PHQ9 SCORE ONLY 02/13/2021 08/08/2020 06/20/2020  PHQ-9 Total Score 4 4 1    Wt Readings from Last 3 Encounters:  02/13/21 223 lb 3.2 oz (101.2 kg)  10/31/20 228 lb (103.4 kg)  10/24/20 228 lb (103.4 kg)    ---------------------------------------------------------------------------------------------------  Hypertension,  follow-up  BP Readings from Last 3 Encounters:  02/13/21 122/67  10/31/20 (!) 165/65  10/24/20 (!) 156/76   Wt Readings from Last 3 Encounters:  02/13/21 223 lb 3.2 oz (101.2 kg)  10/31/20 228 lb (103.4 kg)  10/24/20 228 lb (103.4 kg)     She was last seen for hypertension 6 months ago.  . Management since that visit includes continue medications.  She reports excellent compliance with treatment. She is not having side effects.  She is following a Regular diet. She is not exercising. She does not smoke.  Use of agents associated with hypertension: none.   Outside blood pressures are not checked. Symptoms: No chest pain No chest pressure  No palpitations No syncope  No dyspnea No orthopnea  No paroxysmal nocturnal dyspnea No lower extremity edema   Pertinent labs: Lab Results  Component Value Date   CHOL 142 08/10/2020   HDL 50 08/10/2020   LDLCALC 64 08/10/2020   TRIG 168 (H) 08/10/2020   CHOLHDL 2.8 08/10/2020   Lab Results  Component Value Date   NA 139 10/22/2020   K 3.5 10/22/2020   CREATININE 1.05 (H) 10/22/2020   GFRNONAA 57 (L) 10/22/2020   GFRAA 75 08/10/2020   GLUCOSE 165 (H) 10/22/2020     The 10-year ASCVD risk score Mikey Bussing DC Jr., et al., 2013) is: 10.6%   --------------------------------------------------------------------------------------------------- Lipid/Cholesterol, Follow-up  Last lipid panel Other pertinent labs  Lab Results  Component Value Date   CHOL 142 08/10/2020   HDL  50 08/10/2020   LDLCALC 64 08/10/2020   TRIG 168 (H) 08/10/2020   CHOLHDL 2.8 08/10/2020   Lab Results  Component Value Date   ALT 13 10/22/2020   AST 19 10/22/2020   PLT 203 10/22/2020   TSH 1.77 12/20/2014     She was last seen for this 6 months ago.  Management since that visit includes continue lipitor 10 mg QHS.  She reports excellent compliance with treatment. She is not having side effects.   Symptoms: No chest pain No chest pressure/discomfort   No dyspnea No lower extremity edema  No numbness or tingling of extremity No orthopnea  No palpitations No paroxysmal nocturnal dyspnea  No speech difficulty No syncope   Current diet: in general, an "unhealthy" diet Current exercise: none  The 10-year ASCVD risk score Mikey Bussing DC Jr., et al., 2013) is: 10.6%  ---------------------------------------------------------------------------------------------------     Medications: Outpatient Medications Prior to Visit  Medication Sig  . acetaminophen (TYLENOL) 500 MG tablet Take 1,500 mg by mouth every 4 (four) hours as needed for moderate pain.   . bisoprolol-hydrochlorothiazide (ZIAC) 10-6.25 MG tablet Take 1 tablet by mouth daily.  . Calcium Carb-Cholecalciferol (OYSTER SHELL CALCIUM + D PO) Take 1 tablet by mouth daily. 600mg  calcium plus 20 mg Vit D  . Cholecalciferol (VITAMIN D-3) 25 MCG (1000 UT) CAPS Take 1,000 Units by mouth daily.   Marland Kitchen escitalopram (LEXAPRO) 10 MG tablet TAKE 1 TABLET EVERY DAY  . ferrous gluconate (FERGON) 324 MG tablet Take 1 tablet (324 mg total) by mouth 2 (two) times daily with a meal.  . hydrochlorothiazide (HYDRODIURIL) 12.5 MG tablet TAKE 1 TABLET EVERY DAY  . omeprazole (PRILOSEC) 20 MG capsule TAKE 1 CAPSULE EVERY DAY  . traZODone (DESYREL) 50 MG tablet TAKE 1 TABLET EVERY DAY  . warfarin (COUMADIN) 1 MG tablet Take 1 tablet by mouth daily.  Marland Kitchen warfarin (COUMADIN) 2 MG tablet Take 2 mg by mouth as directed. 1.5 mg x 5 days and 2mg  x 2 days  . [DISCONTINUED] amLODipine (NORVASC) 2.5 MG tablet TAKE 1 TABLET EVERY DAY  . [DISCONTINUED] atorvastatin (LIPITOR) 10 MG tablet TAKE 1 TABLET EVERY DAY  . [DISCONTINUED] lisinopril (ZESTRIL) 20 MG tablet TAKE 1 TABLET EVERY DAY (Patient taking differently: Take 20 mg by mouth daily.)  . [DISCONTINUED] potassium chloride SA (KLOR-CON) 20 MEQ tablet TAKE 1 TABLET EVERY DAY (Patient taking differently: Take 20 mEq by mouth daily.)   No facility-administered medications  prior to visit.    Review of Systems     Objective    BP 122/67 (BP Location: Left Arm, Patient Position: Sitting, Cuff Size: Large)   Pulse 77   Temp 98.5 F (36.9 C) (Oral)   Wt 223 lb 3.2 oz (101.2 kg)   SpO2 98%   BMI 38.31 kg/m     Physical Exam Constitutional:      Appearance: Normal appearance.  Cardiovascular:     Rate and Rhythm: Normal rate and regular rhythm.     Heart sounds: Normal heart sounds.  Pulmonary:     Effort: Pulmonary effort is normal. No respiratory distress.     Breath sounds: Normal breath sounds.  Skin:    General: Skin is warm and dry.  Neurological:     Mental Status: She is alert and oriented to person, place, and time. Mental status is at baseline.  Psychiatric:        Mood and Affect: Mood normal.        Behavior:  Behavior normal.       No results found for any visits on 02/13/21.  Assessment & Plan    1. Morbid obesity ([place])  Discussed importance of healthy weight management Discussed diet and exercise  2. Anxiety  Continue medications.  3. Hypercholesteremia  - atorvastatin (LIPITOR) 10 MG tablet; Take 1 tablet (10 mg total) by mouth daily.  Dispense: 90 tablet; Refill: 1  4. Primary hypertension  Continue medications.  5. Osteoporosis, unspecified osteoporosis type, unspecified pathological fracture presence  Didn't tolerate bisphosphonate due to bone pain. Declines further treatment.   6. Atrial fibrillation, unspecified type ([place])  Followed by cardiology.   7. Malignant neoplasm of central portion of right breast in female, estrogen receptor positive ([place])  Followed by surgery.  8. Essential hypertension  - lisinopril (ZESTRIL) 20 MG tablet; Take 1 tablet (20 mg total) by mouth daily.  Dispense: 90 tablet; Refill: 1 - potassium chloride SA (KLOR-CON) 20 MEQ tablet; Take 1 tablet (20 mEq total) by mouth daily.  Dispense: 90 tablet; Refill: 1  9. Hypertension, unspecified type  - amLODipine (NORVASC) 2.5  MG tablet; Take 1 tablet (2.5 mg total) by mouth daily.  Dispense: 90 tablet; Refill: 1   No follow-ups on file.      ITrinna Post, PA-C, have reviewed all documentation for this visit. The documentation on 02/15/21 for the exam, diagnosis, procedures, and orders are all accurate and complete.  The entirety of the information documented in the History of Present Illness, Review of Systems and Physical Exam were personally obtained by me. Portions of this information were initially documented by [facility] and reviewed by me for thoroughness and accuracy.     Paulene Floor  [facility] [phone] (phone) [phone] (fax)  [place]

## 2021-02-13 NOTE — Patient Instructions (Signed)
Health Maintenance, Female Adopting a healthy lifestyle and getting preventive care are important in promoting health and wellness. Ask your health care provider about:  The right schedule for you to have regular tests and exams.  Things you can do on your own to prevent diseases and keep yourself healthy. What should I know about diet, weight, and exercise? Eat a healthy diet  Eat a diet that includes plenty of vegetables, fruits, low-fat dairy products, and lean protein.  Do not eat a lot of foods that are high in solid fats, added sugars, or sodium.   Maintain a healthy weight Body mass index (BMI) is used to identify weight problems. It estimates body fat based on height and weight. Your health care provider can help determine your BMI and help you achieve or maintain a healthy weight. Get regular exercise Get regular exercise. This is one of the most important things you can do for your health. Most adults should:  Exercise for at least 150 minutes each week. The exercise should increase your heart rate and make you sweat (moderate-intensity exercise).  Do strengthening exercises at least twice a week. This is in addition to the moderate-intensity exercise.  Spend less time sitting. Even light physical activity can be beneficial. Watch cholesterol and blood lipids Have your blood tested for lipids and cholesterol at 71 years of age, then have this test every 5 years. Have your cholesterol levels checked more often if:  Your lipid or cholesterol levels are high.  You are older than 71 years of age.  You are at high risk for heart disease. What should I know about cancer screening? Depending on your health history and family history, you may need to have cancer screening at various ages. This may include screening for:  Breast cancer.  Cervical cancer.  Colorectal cancer.  Skin cancer.  Lung cancer. What should I know about heart disease, diabetes, and high blood  pressure? Blood pressure and heart disease  High blood pressure causes heart disease and increases the risk of stroke. This is more likely to develop in people who have high blood pressure readings, are of African descent, or are overweight.  Have your blood pressure checked: ? Every 3-5 years if you are 18-39 years of age. ? Every year if you are 40 years old or older. Diabetes Have regular diabetes screenings. This checks your fasting blood sugar level. Have the screening done:  Once every three years after age 40 if you are at a normal weight and have a low risk for diabetes.  More often and at a younger age if you are overweight or have a high risk for diabetes. What should I know about preventing infection? Hepatitis B If you have a higher risk for hepatitis B, you should be screened for this virus. Talk with your health care provider to find out if you are at risk for hepatitis B infection. Hepatitis C Testing is recommended for:  Everyone born from 1945 through 1965.  Anyone with known risk factors for hepatitis C. Sexually transmitted infections (STIs)  Get screened for STIs, including gonorrhea and chlamydia, if: ? You are sexually active and are younger than 71 years of age. ? You are older than 71 years of age and your health care provider tells you that you are at risk for this type of infection. ? Your sexual activity has changed since you were last screened, and you are at increased risk for chlamydia or gonorrhea. Ask your health care provider   if you are at risk.  Ask your health care provider about whether you are at high risk for HIV. Your health care provider may recommend a prescription medicine to help prevent HIV infection. If you choose to take medicine to prevent HIV, you should first get tested for HIV. You should then be tested every 3 months for as long as you are taking the medicine. Pregnancy  If you are about to stop having your period (premenopausal) and  you may become pregnant, seek counseling before you get pregnant.  Take 400 to 800 micrograms (mcg) of folic acid every day if you become pregnant.  Ask for birth control (contraception) if you want to prevent pregnancy. Osteoporosis and menopause Osteoporosis is a disease in which the bones lose minerals and strength with aging. This can result in bone fractures. If you are 65 years old or older, or if you are at risk for osteoporosis and fractures, ask your health care provider if you should:  Be screened for bone loss.  Take a calcium or vitamin D supplement to lower your risk of fractures.  Be given hormone replacement therapy (HRT) to treat symptoms of menopause. Follow these instructions at home: Lifestyle  Do not use any products that contain nicotine or tobacco, such as cigarettes, e-cigarettes, and chewing tobacco. If you need help quitting, ask your health care provider.  Do not use street drugs.  Do not share needles.  Ask your health care provider for help if you need support or information about quitting drugs. Alcohol use  Do not drink alcohol if: ? Your health care provider tells you not to drink. ? You are pregnant, may be pregnant, or are planning to become pregnant.  If you drink alcohol: ? Limit how much you use to 0-1 drink a day. ? Limit intake if you are breastfeeding.  Be aware of how much alcohol is in your drink. In the [place] one drink equals one 12 oz bottle of beer (355 mL), one 5 oz glass of wine (148 mL), or one 1 oz glass of hard liquor (44 mL). General instructions  Schedule regular health, dental, and eye exams.  Stay current with your vaccines.  Tell your health care provider if: ? You often feel depressed. ? You have ever been abused or do not feel safe at home. Summary  Adopting a healthy lifestyle and getting preventive care are important in promoting health and wellness.  Follow your health care provider's instructions about healthy  diet, exercising, and getting tested or screened for diseases.  Follow your health care provider's instructions on monitoring your cholesterol and blood pressure. This information is not intended to replace advice given to you by your health care provider. Make sure you discuss any questions you have with your health care provider. Document Revised: 12/01/2018 Document Reviewed: 12/01/2018 Elsevier Patient Education  2021 Elsevier Inc.  

## 2021-03-15 ENCOUNTER — Other Ambulatory Visit: Payer: Self-pay | Admitting: Physician Assistant

## 2021-03-15 DIAGNOSIS — I10 Essential (primary) hypertension: Secondary | ICD-10-CM

## 2021-03-15 MED ORDER — HYDROCHLOROTHIAZIDE 12.5 MG PO TABS: 12.5000 mg | ORAL_TABLET | Freq: Every day | ORAL | 0 refills | Status: DC

## 2021-03-15 NOTE — Telephone Encounter (Signed)
Copied from [place] [id number]. Topic: Quick Communication - Rx Refill/Question >> Mar 15, 2021  1:29 PM Tessa Lerner A wrote: Medication: hydrochlorothiazide (HYDRODIURIL) 12.5 MG tablet   Has the patient contacted their pharmacy? Yes. Patient was directed by their pharmacy to contact their PCP  Preferred Pharmacy (with phone number or street name): [facility] [place], [place] Phone: [phone]  Agent: Please be advised that RX refills may take up to 3 business days. We ask that you follow-up with your pharmacy.

## 2021-03-19 DIAGNOSIS — I4891 Unspecified atrial fibrillation: Secondary | ICD-10-CM | POA: Diagnosis not present

## 2021-03-28 ENCOUNTER — Encounter: Payer: Self-pay | Admitting: Gastroenterology

## 2021-03-28 ENCOUNTER — Other Ambulatory Visit: Payer: Self-pay

## 2021-03-28 ENCOUNTER — Ambulatory Visit: Payer: Medicare HMO | Admitting: Gastroenterology

## 2021-03-28 VITALS — BP 137/79 | HR 77 | Temp 98.5°F | Ht 64.0 in | Wt 220.0 lb

## 2021-03-28 DIAGNOSIS — D509 Iron deficiency anemia, unspecified: Secondary | ICD-10-CM

## 2021-03-28 DIAGNOSIS — K219 Gastro-esophageal reflux disease without esophagitis: Secondary | ICD-10-CM

## 2021-03-28 NOTE — Progress Notes (Signed)
Erin Antigua, MD [address]  [place]  [place], [place] [address]  Main: [phone]  Fax: [phone]   Primary Care Physician: Trinna Post, PA-C   Chief complaint: Anemia  HPI: Erin Romero is a 70 y.o. female who underwent work-up for iron deficiency anemia with EGD colonoscopy and small bowel capsule study.  Capsule study was inconclusive.  Hemoglobin is now normal.  Patient denies any episodes of bleeding.  No abdominal pain, nausea vomiting or diarrhea.  Denies any dysphagia.  However, reports globus sensation that is not getting better with Prilosec.  Schatzki's ring was dilated on last procedure which she states did not lead to any improvement in symptoms.  Does report that sometimes when she swallows a pill, it feels that it is sitting at the top of her stomach and sometimes she has mild emesis after that.  Current Outpatient Medications  Medication Sig Dispense Refill  . acetaminophen (TYLENOL) 500 MG tablet Take 1,500 mg by mouth every 4 (four) hours as needed for moderate pain.     Marland Kitchen amLODipine (NORVASC) 2.5 MG tablet Take 1 tablet (2.5 mg total) by mouth daily. 90 tablet 1  . atorvastatin (LIPITOR) 10 MG tablet Take 1 tablet (10 mg total) by mouth daily. 90 tablet 1  . bisoprolol-hydrochlorothiazide (ZIAC) 10-6.25 MG tablet Take 1 tablet by mouth daily. 90 tablet 1  . Calcium Carb-Cholecalciferol (OYSTER SHELL CALCIUM + D PO) Take 1 tablet by mouth daily. 600mg  calcium plus 20 mg Vit D    . Cholecalciferol (VITAMIN D-3) 25 MCG (1000 UT) CAPS Take 1,000 Units by mouth daily.     Marland Kitchen escitalopram (LEXAPRO) 10 MG tablet TAKE 1 TABLET EVERY DAY 90 tablet 1  . ferrous gluconate (FERGON) 324 MG tablet Take 1 tablet (324 mg total) by mouth 2 (two) times daily with a meal. 60 tablet 3  . hydrochlorothiazide (HYDRODIURIL) 12.5 MG tablet Take 1 tablet (12.5 mg total) by mouth daily. 90 tablet 0  . lisinopril (ZESTRIL) 20 MG tablet Take 1 tablet (20 mg  total) by mouth daily. 90 tablet 1  . omeprazole (PRILOSEC) 20 MG capsule TAKE 1 CAPSULE EVERY DAY 90 capsule 3  . potassium chloride SA (KLOR-CON) 20 MEQ tablet Take 1 tablet (20 mEq total) by mouth daily. 90 tablet 1  . traZODone (DESYREL) 50 MG tablet TAKE 1 TABLET EVERY DAY 90 tablet 0  . warfarin (COUMADIN) 1 MG tablet Take 1 tablet by mouth daily.    Marland Kitchen warfarin (COUMADIN) 2 MG tablet Take 2 mg by mouth as directed. 1.5 mg x 5 days and 2mg  x 2 days     No current facility-administered medications for this visit.    Allergies as of 03/28/2021 - Review Complete 03/28/2021  Allergen Reaction Noted  . Taxotere [docetaxel] Itching and Rash 04/12/2015  . Sulfa antibiotics Hives 03/28/2013    ROS:  General: Negative for anorexia, weight loss, fever, chills, fatigue, weakness. ENT: Negative for hoarseness, difficulty swallowing , nasal congestion. CV: Negative for chest pain, angina, palpitations, dyspnea on exertion, peripheral edema.  Respiratory: Negative for dyspnea at rest, dyspnea on exertion, cough, sputum, wheezing.  GI: See history of present illness. GU:  Negative for dysuria, hematuria, urinary incontinence, urinary frequency, nocturnal urination.  Endo: Negative for unusual weight change.    Physical Examination:   BP 137/79   Pulse 77   Temp 98.5 F (36.9 C) (Oral)   Ht 5\' 4"  (1.626 m)   Wt 220 lb (  99.8 kg)   BMI 37.76 kg/m   General: Well-nourished, well-developed in no acute distress.  Eyes: No icterus. Conjunctivae pink. Mouth: Oropharyngeal mucosa moist and pink , no lesions erythema or exudate. Neck: Supple, Trachea midline Abdomen: Bowel sounds are normal, nontender, nondistended, no hepatosplenomegaly or masses, no abdominal bruits or hernia , no rebound or guarding.   Extremities: No lower extremity edema. No clubbing or deformities. Neuro: Alert and oriented x 3.  Grossly intact. Skin: Warm and dry, no jaundice.   Psych: Alert and cooperative, normal  mood and affect.   Labs: CMP     Component Value Date/Time   NA 139 10/22/2020 1101   NA 140 08/10/2020 1137   NA 142 12/20/2014 1100   K 3.5 10/22/2020 1101   K 3.5 12/20/2014 1100   CL 100 10/22/2020 1101   CL 101 12/20/2014 1100   CO2 30 10/22/2020 1101   CO2 32 12/20/2014 1100   GLUCOSE 165 (H) 10/22/2020 1101   GLUCOSE 116 (H) 12/20/2014 1100   BUN 12 10/22/2020 1101   BUN 12 08/10/2020 1137   BUN 13 12/20/2014 1100   CREATININE 1.05 (H) 10/22/2020 1101   CREATININE 0.86 12/20/2014 1100   CALCIUM 8.8 (L) 10/22/2020 1101   CALCIUM 8.9 12/20/2014 1100   PROT 7.5 10/22/2020 1101   PROT 7.1 08/10/2020 1137   PROT 7.4 12/20/2014 1100   ALBUMIN 3.5 10/22/2020 1101   ALBUMIN 3.9 08/10/2020 1137   ALBUMIN 3.4 12/20/2014 1100   AST 19 10/22/2020 1101   AST 16 12/20/2014 1100   ALT 13 10/22/2020 1101   ALT 21 12/20/2014 1100   ALKPHOS 68 10/22/2020 1101   ALKPHOS 73 12/20/2014 1100   BILITOT 0.9 10/22/2020 1101   BILITOT 0.4 08/10/2020 1137   BILITOT 0.5 12/20/2014 1100   GFRNONAA 57 (L) 10/22/2020 1101   GFRNONAA >60 12/20/2014 1100   GFRNONAA >60 06/15/2014 1020   GFRAA 75 08/10/2020 1137   GFRAA >60 12/20/2014 1100   GFRAA >60 06/15/2014 1020   Lab Results  Component Value Date   WBC 9.6 10/22/2020   HGB 13.7 10/22/2020   HCT 39.7 10/22/2020   MCV 86.3 10/22/2020   PLT 203 10/22/2020    Imaging Studies: No results found.  Assessment and Plan:   Erin Romero is a 70 y.o. y/o female here for follow-up of iron deficiency anemia  We discussed option of repeating capsule study given that the last one was inconclusive.  However, given that her hemoglobin has normalized, patient is not interested in repeating the capsule study at this time, but will consider it if repeat labs pending in June with hematologist show that she is anemic began.  I suspect that some of the symptoms that she is reporting as listed in her HPI may be due to her small hiatal  hernia.  I discussed surgery referral, but patient is not interested at this time  If anemia reoccurs, patient vies to call us back and she verbalized understanding  Follow-up in July or August to reassess labs or any underlying symptoms  Patient would like to continue Prilosec at this time given that it is helping with reflux  (Risks of PPI use were discussed with patient including bone loss, C. Diff diarrhea, pneumonia, infections, CKD, electrolyte abnormalities.  Pt. Verbalizes understanding and chooses to continue the medication.)  Patient educated extensively on acid reflux lifestyle modification, including buying a bed wedge, not eating 3 hrs before bedtime, diet modifications, and handout given  for the same.    Dr Erin Romero

## 2021-04-17 DIAGNOSIS — I10 Essential (primary) hypertension: Secondary | ICD-10-CM | POA: Diagnosis not present

## 2021-04-17 DIAGNOSIS — Z6841 Body Mass Index (BMI) 40.0 and over, adult: Secondary | ICD-10-CM | POA: Diagnosis not present

## 2021-04-17 DIAGNOSIS — E7800 Pure hypercholesterolemia, unspecified: Secondary | ICD-10-CM | POA: Diagnosis not present

## 2021-04-17 DIAGNOSIS — I4891 Unspecified atrial fibrillation: Secondary | ICD-10-CM | POA: Diagnosis not present

## 2021-05-15 DIAGNOSIS — I4891 Unspecified atrial fibrillation: Secondary | ICD-10-CM | POA: Diagnosis not present

## 2021-05-16 ENCOUNTER — Other Ambulatory Visit: Payer: Self-pay | Admitting: Family Medicine

## 2021-05-16 DIAGNOSIS — I10 Essential (primary) hypertension: Secondary | ICD-10-CM

## 2021-05-16 MED ORDER — HYDROCHLOROTHIAZIDE 12.5 MG PO TABS: 12.5000 mg | ORAL_TABLET | Freq: Every day | ORAL | 0 refills | Status: DC

## 2021-05-16 NOTE — Telephone Encounter (Signed)
Copied from [place] [id number]. Topic: Quick Communication - Rx Refill/Question >> May 16, 2021 11:57 AM Leward Quan A wrote: Medication: hydrochlorothiazide (HYDRODIURIL) 12.5 MG tablet  Had to get an emergency supply sent to local pharmacy, need Rx plus refills sent in please Has the patient contacted their pharmacy? Yes.   (Agent: If no, request that the patient contact the pharmacy for the refill.) (Agent: If yes, when and what did the pharmacy advise?)  Preferred Pharmacy (with phone number or street name): [place], [place]  Phone:  [phone] Fax:  [phone]     Agent: Please be advised that RX refills may take up to 3 business days. We ask that you follow-up with your pharmacy.

## 2021-05-27 ENCOUNTER — Other Ambulatory Visit: Payer: Medicare HMO

## 2021-05-29 ENCOUNTER — Ambulatory Visit: Payer: Medicare HMO | Admitting: Oncology

## 2021-06-07 ENCOUNTER — Inpatient Hospital Stay: Payer: Medicare HMO | Attending: Oncology

## 2021-06-07 DIAGNOSIS — D509 Iron deficiency anemia, unspecified: Secondary | ICD-10-CM | POA: Insufficient documentation

## 2021-06-07 DIAGNOSIS — Z7901 Long term (current) use of anticoagulants: Secondary | ICD-10-CM | POA: Diagnosis not present

## 2021-06-07 DIAGNOSIS — K219 Gastro-esophageal reflux disease without esophagitis: Secondary | ICD-10-CM | POA: Insufficient documentation

## 2021-06-07 DIAGNOSIS — I4891 Unspecified atrial fibrillation: Secondary | ICD-10-CM | POA: Diagnosis not present

## 2021-06-07 DIAGNOSIS — Z923 Personal history of irradiation: Secondary | ICD-10-CM | POA: Insufficient documentation

## 2021-06-07 DIAGNOSIS — M810 Age-related osteoporosis without current pathological fracture: Secondary | ICD-10-CM | POA: Diagnosis not present

## 2021-06-07 DIAGNOSIS — Z79899 Other long term (current) drug therapy: Secondary | ICD-10-CM | POA: Diagnosis not present

## 2021-06-07 DIAGNOSIS — D0512 Intraductal carcinoma in situ of left breast: Secondary | ICD-10-CM | POA: Insufficient documentation

## 2021-06-07 DIAGNOSIS — Z9013 Acquired absence of bilateral breasts and nipples: Secondary | ICD-10-CM | POA: Diagnosis not present

## 2021-06-07 DIAGNOSIS — Z170 Estrogen receptor positive status [ER+]: Secondary | ICD-10-CM | POA: Insufficient documentation

## 2021-06-07 DIAGNOSIS — Z853 Personal history of malignant neoplasm of breast: Secondary | ICD-10-CM | POA: Diagnosis not present

## 2021-06-07 LAB — COMPREHENSIVE METABOLIC PANEL
ALT: 12 U/L (ref 0–44)
AST: 22 U/L (ref 15–41)
Albumin: 3.6 g/dL (ref 3.5–5.0)
Alkaline Phosphatase: 78 U/L (ref 38–126)
Anion gap: 12 (ref 5–15)
BUN: 14 mg/dL (ref 8–23)
CO2: 24 mmol/L (ref 22–32)
Calcium: 8.8 mg/dL — ABNORMAL LOW (ref 8.9–10.3)
Chloride: 101 mmol/L (ref 98–111)
Creatinine, Ser: 0.92 mg/dL (ref 0.44–1.00)
GFR, Estimated: >60 mL/min (ref >60)
Glucose, Bld: 170 mg/dL — ABNORMAL HIGH (ref 70–99)
Potassium: 3.4 mmol/L — ABNORMAL LOW (ref 3.5–5.1)
Sodium: 137 mmol/L (ref 135–145)
Total Bilirubin: 0.9 mg/dL (ref 0.3–1.2)
Total Protein: 7.8 g/dL (ref 6.5–8.1)

## 2021-06-07 LAB — CBC WITH DIFFERENTIAL/PLATELET
Abs Immature Granulocytes: 0.04 10*3/uL (ref 0.00–0.07)
Basophils Absolute: 0.1 10*3/uL (ref 0.0–0.1)
Basophils Relative: 1 %
Eosinophils Absolute: 0.1 10*3/uL (ref 0.0–0.5)
Eosinophils Relative: 1 %
HCT: 41.1 % (ref 36.0–46.0)
Hemoglobin: 13.7 g/dL (ref 12.0–15.0)
Immature Granulocytes: 0 %
Lymphocytes Relative: 17 %
Lymphs Abs: 1.7 10*3/uL (ref 0.7–4.0)
MCH: 28.8 pg (ref 26.0–34.0)
MCHC: 33.3 g/dL (ref 30.0–36.0)
MCV: 86.3 fL (ref 80.0–100.0)
Monocytes Absolute: 0.7 10*3/uL (ref 0.1–1.0)
Monocytes Relative: 6 %
Neutro Abs: 7.8 10*3/uL — ABNORMAL HIGH (ref 1.7–7.7)
Neutrophils Relative %: 75 %
Platelets: 252 10*3/uL (ref 150–400)
RBC: 4.76 MIL/uL (ref 3.87–5.11)
RDW: 14.9 % (ref 11.5–15.5)
WBC: 10.4 10*3/uL (ref 4.0–10.5)
nRBC: 0 % (ref 0.0–0.2)

## 2021-06-07 LAB — IRON AND TIBC
Iron: 73 ug/dL (ref 28–170)
Saturation Ratios: 20 % (ref 10.4–31.8)
TIBC: 367 ug/dL (ref 250–450)
UIBC: 294 ug/dL

## 2021-06-07 LAB — FERRITIN: Ferritin: 30 ng/mL (ref 11–307)

## 2021-06-08 LAB — CANCER ANTIGEN 27.29: CA 27.29: 25 U/mL (ref 0.0–38.6)

## 2021-06-10 ENCOUNTER — Encounter: Payer: Self-pay | Admitting: Oncology

## 2021-06-10 ENCOUNTER — Inpatient Hospital Stay: Payer: Medicare HMO | Admitting: Oncology

## 2021-06-10 VITALS — BP 151/88 | HR 79 | Temp 99.6°F | Resp 18 | Wt 226.6 lb

## 2021-06-10 DIAGNOSIS — D500 Iron deficiency anemia secondary to blood loss (chronic): Secondary | ICD-10-CM

## 2021-06-10 DIAGNOSIS — Z853 Personal history of malignant neoplasm of breast: Secondary | ICD-10-CM | POA: Diagnosis not present

## 2021-06-10 DIAGNOSIS — K219 Gastro-esophageal reflux disease without esophagitis: Secondary | ICD-10-CM | POA: Diagnosis not present

## 2021-06-10 DIAGNOSIS — D509 Iron deficiency anemia, unspecified: Secondary | ICD-10-CM | POA: Diagnosis not present

## 2021-06-10 DIAGNOSIS — Z923 Personal history of irradiation: Secondary | ICD-10-CM | POA: Diagnosis not present

## 2021-06-10 DIAGNOSIS — D0512 Intraductal carcinoma in situ of left breast: Secondary | ICD-10-CM

## 2021-06-10 DIAGNOSIS — Z9013 Acquired absence of bilateral breasts and nipples: Secondary | ICD-10-CM | POA: Diagnosis not present

## 2021-06-10 DIAGNOSIS — Z170 Estrogen receptor positive status [ER+]: Secondary | ICD-10-CM | POA: Diagnosis not present

## 2021-06-10 DIAGNOSIS — M810 Age-related osteoporosis without current pathological fracture: Secondary | ICD-10-CM | POA: Diagnosis not present

## 2021-06-10 DIAGNOSIS — I4891 Unspecified atrial fibrillation: Secondary | ICD-10-CM | POA: Diagnosis not present

## 2021-06-10 NOTE — Progress Notes (Signed)
[facility] day:  06/10/21   Chief Complaint: Erin Romero is a 70 y.o. female with stage IIB right breast cancer who is here for follow up.   PERTINENT ONCOLOGY HISTORY Patient previously followed up with Dr. Mike Gip.  Switch care to me on 08/16/2019. Extensive medical records were reviewed. history of stage IIB (T2N1M0) right breast cancer status post radical mastectomy on 2/3/2 medical 014.  Pathology revealed a 3 cm grade 2 invasive mammary carcinoma. 0/2 sentinel lymph nodes were positive.  In non-sentinel lymph nodes were positive for 0.5 cm macro metastasis with extracapsular extension.  DCIS was present.  ER >90 percent, PR>90%, HER-2/neu negative. Patient underwent 3 cycles of adjuvant chemotherapy with Taxotere and Cytoxan (03/08/2013 - 04/20/2013).  Treatment was complicated with erythromelalgia and an urticarial reaction.  And she did not receive any additional chemotherapy. Adjuvant radiation finished in July 2014.  Patient was started on letrozole in August 2014 and she self discontinued in October 2019. BCI showed low likelihood of benefit from extended endocrine therapy.  13.6% risk of late recurrence years 5-10. Patient has been off Letrozole since 09/2018.   Mammogram on 02/06/2014 was suspicious for left breast mass.  Left breast biopsy on 02/27/2014 was negative for malignancy.  Left-sided mammogram on 10/05/2017 was negative.   Left breast screening mammogram on 10/11/2018 revealed no evidence of malignancy.  #  Osteoporosis,  Bone density study on 06/05/2015 revealed osteopenia with a T score of -1.9 in the left femoral neck.  Bone density on 09/08/2018 revealed osteoporosis with a T-score of -2.5 in the left femoral neck and -2.2 in AP spine L2-L4.  Patient is on calcium and vitamin D.  She was on an oral bisphosphonate x 1 month.  Patient previosly declined Prolia. with T score of -2.5 of left femoral neck and -2.2 AP  spine L2-L4.  Previously declined Prolia.  She reports that she had tried some bone strengthen medication for 1 month last year, and did not tolerate.  She cannot remember the name of the medication.  # 01/30/19 left DCIS ER +, s/p mastectomy Patient was resumed on letrozole in December 2020.  After discussion with Dr. Bary Castilla, decision was made to stop letrozole given that patient has now had bilateral mastectomy and benefit of prophylaxis from AI is very small   #Osteoporosis, continue calcium and vitamin D supplementation. Previously discussed about bisphosphonate or Prolia treatment.  Patient declined.   #Family history of cancer, personal history of invasive breast cancer.  No newly diagnosed DCIS, I discussed with patient about genetic testing.  Patient declines.  INTERVAL HISTORY Erin Romero is a 70 y.o. female who has above history reviewed by me today presents for follow up visit for management of breast cancer Problems and complaints are listed below: Patient reports feeling tired and fatigued.   She has experienced worsening of left knew pain after hurting knee 2 weeks ago. No new breast concerns.   Past Medical History:  Diagnosis Date   Anxiety    Atrial fibrillation ([place]) 2014   Breast cancer [facility]) 2014   right breast   Dysrhythmia    Fibrocystic breast disease 2005   GERD (gastroesophageal reflux disease)    Hypertension 2005   Lump or mass in breast 2014   right mastectomy   Malignant neoplasm of central portion of female breast ([place]) 01/24/2013   Stage II,T2, N1a. ER/PR 90%, HER-2/neu: Not overexpressed. Mastectomy with sentinel node biopsy. 2 sentinel  nodes negative, non-sentinel node w/ 5 mm macrometastatic foci in non-sentinel node.  Received post mastectomy radiation.     Past Surgical History:  Procedure Laterality Date   ABDOMINAL HYSTERECTOMY  1999   BREAST BIOPSY Left 2015   BENIGN FIBROTIC BREAST TISSUE WITH CLUSTERED APOCRINE   BREAST BIOPSY Left  11/02/2019   affirm bx, x marker, path pending   BREAST SURGERY Right 2005,2014   biopsy   BREAST SURGERY Right 2014   mastectomy   COLONOSCOPY  2002   COLONOSCOPY WITH PROPOFOL N/A 10/16/2015   Procedure: COLONOSCOPY WITH PROPOFOL;  Surgeon: Robert Bellow, MD;  Location: [facility] ENDOSCOPY;  Service: Endoscopy;  Laterality: N/A;   COLONOSCOPY WITH PROPOFOL N/A 10/31/2020   Procedure: COLONOSCOPY WITH PROPOFOL;  Surgeon: [name], MD;  Location: [facility] ENDOSCOPY;  Service: Endoscopy;  Laterality: N/A;   ESOPHAGOGASTRODUODENOSCOPY (EGD) WITH PROPOFOL N/A 10/31/2020   Procedure: ESOPHAGOGASTRODUODENOSCOPY (EGD) WITH PROPOFOL;  Surgeon: [name], MD;  Location: [facility] ENDOSCOPY;  Service: Endoscopy;  Laterality: N/A;   GIVENS CAPSULE STUDY N/A 12/27/2020   Procedure: GIVENS CAPSULE STUDY;  Surgeon: [name], MD;  Location: [facility] ENDOSCOPY;  Service: Endoscopy;  Laterality: N/A;   KNEE SURGERY Left 2005   MASTECTOMY Right 2014   radiation chemo   SIMPLE MASTECTOMY WITH AXILLARY SENTINEL NODE BIOPSY Left 11/30/2019   Procedure: SIMPLE MASTECTOMY WITH AXILLARY SENTINEL NODE BIOPSY;  Surgeon: Robert Bellow, MD;  Location: [facility] ORS;  Service: General;  Laterality: Left;   thumb surg Right 2004   TONSILLECTOMY     age of 18    Family History  Problem Relation Age of Onset   Heart disease Father    Hypertension Sister    Cancer Mother        cervical   Cancer Other        ovarian,liver,colon cancer-no family member listed   Osteoporosis Paternal Aunt    Osteoporosis Paternal Grandmother     Social History:  reports that she has never smoked. She has never used smokeless tobacco. She reports that she does not drink alcohol and does not use drugs.    Allergies:  Allergies  Allergen Reactions   Taxotere [Docetaxel] Itching and Rash   Sulfa Antibiotics Hives    Current Medications: Current Outpatient Medications  Medication Sig Dispense Refill    acetaminophen (TYLENOL) 500 MG tablet Take 1,500 mg by mouth every 4 (four) hours as needed for moderate pain.      amLODipine (NORVASC) 2.5 MG tablet Take 1 tablet (2.5 mg total) by mouth daily. 90 tablet 1   atorvastatin (LIPITOR) 10 MG tablet Take 1 tablet (10 mg total) by mouth daily. 90 tablet 1   bisoprolol-hydrochlorothiazide (ZIAC) 10-6.25 MG tablet Take 1 tablet by mouth daily. 90 tablet 1   Calcium Carb-Cholecalciferol (OYSTER SHELL CALCIUM + D PO) Take 1 tablet by mouth daily. 628m calcium plus 20 mg Vit D     Cholecalciferol (VITAMIN D-3) 25 MCG (1000 UT) CAPS Take 1,000 Units by mouth daily.      escitalopram (LEXAPRO) 10 MG tablet TAKE 1 TABLET EVERY DAY 90 tablet 1   hydrochlorothiazide (HYDRODIURIL) 12.5 MG tablet Take 1 tablet (12.5 mg total) by mouth daily. 90 tablet 0   lisinopril (ZESTRIL) 20 MG tablet Take 1 tablet (20 mg total) by mouth daily. 90 tablet 1   omeprazole (PRILOSEC) 20 MG capsule TAKE 1 CAPSULE EVERY DAY 90 capsule 3   potassium chloride SA (KLOR-CON) 20 MEQ tablet  Take 1 tablet (20 mEq total) by mouth daily. 90 tablet 1   warfarin (COUMADIN) 1 MG tablet Take 1 tablet by mouth daily.     warfarin (COUMADIN) 2 MG tablet Take 2 mg by mouth as directed. 1.5 mg x 5 days and 94m x 2 days     traZODone (DESYREL) 50 MG tablet TAKE 1 TABLET EVERY DAY (Patient not taking: Reported on 06/10/2021) 90 tablet 0   No current facility-administered medications for this visit.   Review of Systems  Constitutional:  Positive for fatigue. Negative for appetite change, chills and fever.  HENT:   Negative for hearing loss and voice change.   Eyes:  Negative for eye problems.  Respiratory:  Negative for chest tightness and cough.   Cardiovascular:  Negative for chest pain.  Gastrointestinal:  Negative for abdominal distention, abdominal pain and blood in stool.  Endocrine: Negative for hot flashes.  Genitourinary:  Negative for difficulty urinating and frequency.    Musculoskeletal:  Positive for arthralgias.  Skin:  Negative for itching and rash.  Neurological:  Negative for extremity weakness.  Hematological:  Negative for adenopathy.  Psychiatric/Behavioral:  Negative for confusion.    Physical Exam: Blood pressure (!) 151/88, pulse 79, temperature 99.6 F (37.6 C), resp. rate 18, weight 226 lb 9.6 oz (102.8 kg). Physical Exam Constitutional:      General: She is not in acute distress.    Appearance: She is not diaphoretic.     Comments: Morbid obese  HENT:     Head: Normocephalic and atraumatic.     Mouth/Throat:     Pharynx: No oropharyngeal exudate.  Eyes:     General: No scleral icterus.    Pupils: Pupils are equal, round, and reactive to light.  Cardiovascular:     Rate and Rhythm: Normal rate and regular rhythm.     Heart sounds: No murmur heard. Pulmonary:     Effort: Pulmonary effort is normal. No respiratory distress.  Abdominal:     General: There is no distension.     Palpations: Abdomen is soft.     Tenderness: There is no abdominal tenderness.  Musculoskeletal:        General: Normal range of motion.     Cervical back: Normal range of motion and neck supple.  Skin:    General: Skin is warm and dry.     Findings: No erythema.  Neurological:     Mental Status: She is alert and oriented to person, place, and time.     Cranial Nerves: No cranial nerve deficit.  Psychiatric:        Mood and Affect: Affect normal.  No palpable chest wall mass at bilateral mastectomy sites.   RADIOGRAPHIC STUDIES: I have personally reviewed the radiological images as listed and agreed with the findings in the report. No results found.   Assessment:  Erin Ackertis a 70y.o. female follows up for breast cancer. 1. Ductal carcinoma in situ (DCIS) of left breast   2. History of breast cancer   3. Iron deficiency anemia due to chronic blood loss    #History of stage IIB (T2N1M0) right breast cancer, ER PR positive HER-2  negative Patient finished approximately 5 years of letrozole and self stopped in 09/2018. #11/2019  left DCIS, high-grade Off letrozole given that she has had mastectomy for DCIS of treatment and benefit of aromatase inhibitor is low.   Continue surveillance.   #Osteoporosis,continue calcium and vitamin D supplementation. She declines bisphosphonate treatments.  #  Iron deficiency anemia, she is off iron supplementation.  Stable hemoglobin and iron store.   We spent sufficient time to discuss many aspect of care, questions were answered to patient's satisfaction. Total face to face encounter time for this patient visit was 40 min. >50% of the time was  spent in counseling and coordination of care.   She will follow-up  6 months Earlie Server, MD  06/10/2021 , 8:07 PM

## 2021-06-10 NOTE — Progress Notes (Signed)
Patient hurt left knee 2 weeks ago.

## 2021-06-12 DIAGNOSIS — H524 Presbyopia: Secondary | ICD-10-CM | POA: Diagnosis not present

## 2021-06-12 DIAGNOSIS — I480 Paroxysmal atrial fibrillation: Secondary | ICD-10-CM | POA: Diagnosis not present

## 2021-06-12 DIAGNOSIS — H40013 Open angle with borderline findings, low risk, bilateral: Secondary | ICD-10-CM | POA: Diagnosis not present

## 2021-06-19 ENCOUNTER — Other Ambulatory Visit: Payer: Self-pay | Admitting: Family Medicine

## 2021-06-19 DIAGNOSIS — I10 Essential (primary) hypertension: Secondary | ICD-10-CM

## 2021-06-19 NOTE — Telephone Encounter (Signed)
Call to patient- she states she did receive her rx from [facility] and she is good until appointment- Rx denied

## 2021-07-15 DIAGNOSIS — I480 Paroxysmal atrial fibrillation: Secondary | ICD-10-CM | POA: Diagnosis not present

## 2021-07-22 ENCOUNTER — Other Ambulatory Visit: Payer: Self-pay | Admitting: Family Medicine

## 2021-07-22 DIAGNOSIS — I10 Essential (primary) hypertension: Secondary | ICD-10-CM

## 2021-08-13 ENCOUNTER — Other Ambulatory Visit: Payer: Self-pay

## 2021-08-13 ENCOUNTER — Ambulatory Visit (INDEPENDENT_AMBULATORY_CARE_PROVIDER_SITE_OTHER): Payer: Medicare HMO | Admitting: Family Medicine

## 2021-08-13 VITALS — BP 138/75 | HR 73 | Temp 98.7°F | Ht 64.0 in | Wt 232.4 lb

## 2021-08-13 DIAGNOSIS — E7800 Pure hypercholesterolemia, unspecified: Secondary | ICD-10-CM | POA: Diagnosis not present

## 2021-08-13 DIAGNOSIS — R739 Hyperglycemia, unspecified: Secondary | ICD-10-CM

## 2021-08-13 DIAGNOSIS — G4700 Insomnia, unspecified: Secondary | ICD-10-CM | POA: Diagnosis not present

## 2021-08-13 DIAGNOSIS — F419 Anxiety disorder, unspecified: Secondary | ICD-10-CM | POA: Diagnosis not present

## 2021-08-13 DIAGNOSIS — I10 Essential (primary) hypertension: Secondary | ICD-10-CM | POA: Diagnosis not present

## 2021-08-13 MED ORDER — BISOPROLOL-HYDROCHLOROTHIAZIDE 10-6.25 MG PO TABS: 1.0000 | ORAL_TABLET | Freq: Every day | ORAL | 1 refills | Status: DC

## 2021-08-13 MED ORDER — AMLODIPINE BESYLATE 2.5 MG PO TABS: 2.5000 mg | ORAL_TABLET | Freq: Every day | ORAL | 1 refills | Status: DC

## 2021-08-13 MED ORDER — LISINOPRIL 20 MG PO TABS: 20.0000 mg | ORAL_TABLET | Freq: Every day | ORAL | 1 refills | Status: DC

## 2021-08-13 MED ORDER — ATORVASTATIN CALCIUM 10 MG PO TABS: 10.0000 mg | ORAL_TABLET | Freq: Every day | ORAL | 1 refills | Status: DC

## 2021-08-13 MED ORDER — POTASSIUM CHLORIDE CRYS ER 20 MEQ PO TBCR: 20.0000 meq | EXTENDED_RELEASE_TABLET | Freq: Every day | ORAL | 1 refills | Status: DC

## 2021-08-13 MED ORDER — TRAZODONE HCL 50 MG PO TABS: 50.0000 mg | ORAL_TABLET | Freq: Every day | ORAL | 1 refills | Status: DC

## 2021-08-13 MED ORDER — OMEPRAZOLE 20 MG PO CPDR: 20.0000 mg | DELAYED_RELEASE_CAPSULE | Freq: Every day | ORAL | 3 refills | Status: DC

## 2021-08-13 MED ORDER — ESCITALOPRAM OXALATE 10 MG PO TABS: 10.0000 mg | ORAL_TABLET | Freq: Every day | ORAL | 1 refills | Status: DC

## 2021-08-13 MED ORDER — HYDROCHLOROTHIAZIDE 12.5 MG PO TABS: 12.5000 mg | ORAL_TABLET | Freq: Every day | ORAL | 1 refills | Status: DC

## 2021-08-13 NOTE — Assessment & Plan Note (Signed)
BMI 39 with HTN and HLD Discussed importance of healthy weight management Discussed diet and exercise

## 2021-08-13 NOTE — Assessment & Plan Note (Signed)
Previously well controlled Continue statin Repeat FLP and CMP  

## 2021-08-13 NOTE — Progress Notes (Signed)
Established patient visit   Patient: Erin Romero   DOB: 06/22/1951   70 y.o. Female  MRN: [id number] Visit Date: 08/13/2021  Today's healthcare provider: Lavon Paganini, MD   Chief Complaint  Patient presents with   Follow-up        Anxiety      Subjective  -------------------------------------------------------------------------------------------------------------------- Anxiety Patient reports no chest pain, dizziness, nausea, palpitations or shortness of breath.     Anxiety, Follow-up  She was last seen for anxiety 6 months ago.(02/13/21)  Changes made at last visit include continue current medications. Her insomnia is secondary to her anxiety. She is has not taken trazadone because she had difficulties getting her prescription refilled.   She reports sleeping moderately well but would like to continue taking trazadone.   She feels the lexapro is helpful.    She reports good compliance with treatment. She reports good tolerance of treatment. She is not having side effects.   She feels her anxiety is moderate and Improved since last visit.  Symptoms: No chest pain No difficulty concentrating  Yes dizziness No fatigue  No feelings of losing control No insomnia  No irritable No palpitations  No panic attacks No racing thoughts  No shortness of breath No sweating  No tremors/shakes    GAD-7 Results GAD-7 Generalized Anxiety Disorder Screening Tool 02/13/2021  1. Feeling Nervous, Anxious, or on Edge 0  2. Not Being Able to Stop or Control Worrying 2  3. Worrying Too Much About Different Things 2  4. Trouble Relaxing 0  5. Being So Restless it's Hard To Sit Still 0  6. Becoming Easily Annoyed or Irritable 0  7. Feeling Afraid As If Something Awful Might Happen 0  Total GAD-7 Score 4  Difficulty At Work, Home, or Getting  Along With Others? Not difficult at all    PHQ-9 Scores PHQ9 SCORE ONLY 02/13/2021 08/08/2020 06/20/2020  PHQ-9 Total Score 4  4 1    Hypertension, follow-up  BP Readings from Last 3 Encounters:  06/10/21 (!) 151/88  03/28/21 137/79  02/13/21 122/67   Wt Readings from Last 3 Encounters:  06/10/21 226 lb 9.6 oz (102.8 kg)  03/28/21 220 lb (99.8 kg)  02/13/21 223 lb 3.2 oz (101.2 kg)     She was last seen for hypertension 6 months ago.  BP at that visit was 122/67. Management since that visit includes continue medications. She is taking HCTZ, 20 mg lisinopril and 2.5 mg amlodipine.   She reports good compliance with treatment. She is not having side effects.  She is following a Regular diet. She is not exercising. She does not smoke.  Use of agents associated with hypertension: none.   Outside blood pressures are none. Symptoms: No chest pain No chest pressure  No palpitations No syncope  No dyspnea No orthopnea  No paroxysmal nocturnal dyspnea No lower extremity edema   Pertinent labs: Lab Results  Component Value Date   CHOL 142 08/10/2020   HDL 50 08/10/2020   LDLCALC 64 08/10/2020   TRIG 168 (H) 08/10/2020   CHOLHDL 2.8 08/10/2020   Lab Results  Component Value Date   NA 137 06/07/2021   K 3.4 (L) 06/07/2021   CREATININE 0.92 06/07/2021   GFRNONAA >60 06/07/2021   GFRAA 75 08/10/2020   GLUCOSE 170 (H) 06/07/2021     The 10-year ASCVD risk score Mikey Bussing DC Jr., et al., 2013) is: 17.7%   ---------------------------------------------------------------------------------------------------  ---------------------------------------------------------------------------------------------------   Patient Active Problem List  Diagnosis Date Noted   Iron deficiency anemia    Schatzki's ring    Gastric polyp    Morbid obesity ([place]) 08/09/2020   Hypokalemia 02/04/2016   Cystitis 07/26/2015   Osteoporosis 06/05/2015   Adaptation reaction 05/10/2015   Anxiety 05/10/2015   Arthritis 05/10/2015   Esophageal tissue web 05/10/2015   Genital herpes 05/10/2015   Bloodgood disease 05/10/2015    Blood glucose elevated 05/10/2015   BP (high blood pressure) 05/10/2015   Insomnia 05/10/2015   Headache, migraine 05/10/2015   Avitaminosis D 05/10/2015   Rash and nonspecific skin eruption 04/04/2015   Clinical depression 10/02/2014   Hypercholesteremia 10/02/2014   A-fib ([place]) 04/18/2014   Breast mass, left 02/27/2014   Breast cancer, right breast ([place]) 09/12/2013   Lump or mass in breast    Malignant neoplasm of central portion of female breast [facility])    Past Medical History:  Diagnosis Date   Anxiety    Atrial fibrillation ([place]) 2014   Breast cancer ([place]) 2014   right breast   Dysrhythmia    Fibrocystic breast disease 2005   GERD (gastroesophageal reflux disease)    Hypertension 2005   Lump or mass in breast 2014   right mastectomy   Malignant neoplasm of central portion of female breast ([place]) 01/24/2013   Stage II,T2, N1a. ER/PR 90%, HER-2/neu: Not overexpressed. Mastectomy with sentinel node biopsy. 2 sentinel nodes negative, non-sentinel node w/ 5 mm macrometastatic foci in non-sentinel node.  Received post mastectomy radiation.    Allergies  Allergen Reactions   Taxotere [Docetaxel] Itching and Rash   Sulfa Antibiotics Hives       Medications: Outpatient Medications Prior to Visit  Medication Sig   acetaminophen (TYLENOL) 500 MG tablet Take 1,500 mg by mouth every 4 (four) hours as needed for moderate pain.    amLODipine (NORVASC) 2.5 MG tablet Take 1 tablet (2.5 mg total) by mouth daily.   atorvastatin (LIPITOR) 10 MG tablet Take 1 tablet (10 mg total) by mouth daily.   bisoprolol-hydrochlorothiazide (ZIAC) 10-6.25 MG tablet Take 1 tablet by mouth daily.   Calcium Carb-Cholecalciferol (OYSTER SHELL CALCIUM + D PO) Take 1 tablet by mouth daily. 697m calcium plus 20 mg Vit D   Cholecalciferol (VITAMIN D-3) 25 MCG (1000 UT) CAPS Take 1,000 Units by mouth daily.    escitalopram (LEXAPRO) 10 MG tablet TAKE 1 TABLET EVERY DAY   hydrochlorothiazide (HYDRODIURIL) 12.5  MG tablet TAKE 1 TABLET EVERY DAY   lisinopril (ZESTRIL) 20 MG tablet Take 1 tablet (20 mg total) by mouth daily.   omeprazole (PRILOSEC) 20 MG capsule TAKE 1 CAPSULE EVERY DAY   potassium chloride SA (KLOR-CON) 20 MEQ tablet Take 1 tablet (20 mEq total) by mouth daily.   traZODone (DESYREL) 50 MG tablet TAKE 1 TABLET EVERY DAY (Patient not taking: Reported on 06/10/2021)   warfarin (COUMADIN) 1 MG tablet Take 1 tablet by mouth daily.   warfarin (COUMADIN) 2 MG tablet Take 2 mg by mouth as directed. 1.5 mg x 5 days and 238mx 2 days   No facility-administered medications prior to visit.    Review of Systems  Constitutional:  Negative for chills, fatigue and fever.  HENT:  Negative for ear pain, sinus pressure, sinus pain and sore throat.   Eyes:  Negative for pain and visual disturbance.  Respiratory:  Negative for cough, chest tightness, shortness of breath and wheezing.   Cardiovascular:  Negative for chest pain, palpitations and leg swelling.  Gastrointestinal:  Negative  for abdominal pain, blood in stool, diarrhea, nausea and vomiting.  Genitourinary:  Negative for flank pain, frequency, pelvic pain and urgency.  Musculoskeletal:  Negative for back pain, myalgias and neck pain.  Neurological:  Negative for dizziness, weakness, light-headedness, numbness and headaches.   Last CBC Lab Results  Component Value Date   WBC 10.4 06/07/2021   HGB 13.7 06/07/2021   HCT 41.1 06/07/2021   MCV 86.3 06/07/2021   MCH 28.8 06/07/2021   RDW 14.9 06/07/2021   PLT 252 11/94/1740   Last metabolic panel Lab Results  Component Value Date   GLUCOSE 170 (H) 06/07/2021   NA 137 06/07/2021   K 3.4 (L) 06/07/2021   CL 101 06/07/2021   CO2 24 06/07/2021   BUN 14 06/07/2021   CREATININE 0.92 06/07/2021   GFRNONAA >60 06/07/2021   GFRAA 75 08/10/2020   CALCIUM 8.8 (L) 06/07/2021   PROT 7.8 06/07/2021   ALBUMIN 3.6 06/07/2021   LABGLOB 3.2 08/10/2020   AGRATIO 1.2 08/10/2020   BILITOT 0.9  06/07/2021   ALKPHOS 78 06/07/2021   AST 22 06/07/2021   ALT 12 06/07/2021   ANIONGAP 12 06/07/2021   Last lipids Lab Results  Component Value Date   CHOL 142 08/10/2020   HDL 50 08/10/2020   LDLCALC 64 08/10/2020   TRIG 168 (H) 08/10/2020   CHOLHDL 2.8 08/10/2020   Last hemoglobin A1c Lab Results  Component Value Date   HGBA1C 5.3 08/10/2020   Last thyroid functions Lab Results  Component Value Date   TSH 1.77 12/20/2014     Objective  -------------------------------------------------------------------------------------------------------------------- There were no vitals taken for this visit. BP Readings from Last 3 Encounters:  06/10/21 (!) 151/88  03/28/21 137/79  02/13/21 122/67    Wt Readings from Last 3 Encounters:  06/10/21 226 lb 9.6 oz (102.8 kg)  03/28/21 220 lb (99.8 kg)  02/13/21 223 lb 3.2 oz (101.2 kg)       Physical Exam Vitals reviewed.  Constitutional:      General: She is not in acute distress.    Appearance: Normal appearance. She is well-developed. She is not diaphoretic.  HENT:     Head: Normocephalic and atraumatic.  Eyes:     General: No scleral icterus.    Conjunctiva/sclera: Conjunctivae normal.  Neck:     Thyroid: No thyromegaly.  Cardiovascular:     Rate and Rhythm: Normal rate. Rhythm irregularly irregular.     Pulses: Normal pulses.     Heart sounds: Normal heart sounds. No murmur heard.    Comments: (+) A-Fib Pulmonary:     Effort: Pulmonary effort is normal. No respiratory distress.     Breath sounds: Normal breath sounds. No wheezing, rhonchi or rales.  Musculoskeletal:     Cervical back: Neck supple.     Right lower leg: No edema.     Left lower leg: No edema.  Lymphadenopathy:     Cervical: No cervical adenopathy.  Skin:    General: Skin is warm and dry.     Findings: No rash.  Neurological:     Mental Status: She is alert and oriented to person, place, and time. Mental status is at baseline.  Psychiatric:         Mood and Affect: Mood normal.        Behavior: Behavior normal.     No results found for any visits on 08/13/21.  Assessment & Plan  ---------------------------------------------------------------------------------------------------------------------- Problem List Items Addressed This Visit  Cardiovascular and Mediastinum   BP (high blood pressure)    Well controlled Continue current medications Reviewed recent metabolic panel F/u in 6 months       Relevant Medications   lisinopril (ZESTRIL) 20 MG tablet   hydrochlorothiazide (HYDRODIURIL) 12.5 MG tablet   bisoprolol-hydrochlorothiazide (ZIAC) 10-6.25 MG tablet   atorvastatin (LIPITOR) 10 MG tablet   amLODipine (NORVASC) 2.5 MG tablet     Other   Anxiety - Primary    Chronic and well controlled Continue lexapro at current dose Encourage therapy      Relevant Medications   traZODone (DESYREL) 50 MG tablet   escitalopram (LEXAPRO) 10 MG tablet   Hypercholesteremia    Previously well controlled Continue statin Repeat FLP and CMP      Relevant Medications   lisinopril (ZESTRIL) 20 MG tablet   hydrochlorothiazide (HYDRODIURIL) 12.5 MG tablet   bisoprolol-hydrochlorothiazide (ZIAC) 10-6.25 MG tablet   atorvastatin (LIPITOR) 10 MG tablet   amLODipine (NORVASC) 2.5 MG tablet   Other Relevant Orders   Lipid panel   Blood glucose elevated    Recommend low carb diet Recheck A1c      Relevant Orders   Hemoglobin A1c   Insomnia    Chronic and stable Continue trazodone prn      Relevant Medications   traZODone (DESYREL) 50 MG tablet   Morbid obesity (HCC)    BMI 39 with HTN and HLD Discussed importance of healthy weight management Discussed diet and exercise         Return in about 6 months (around 02/13/2022) for CPE, AWV, With new PCP.      I,Essence Turner,acting as a Education administrator for Lavon Paganini, MD.,have documented all relevant documentation on the behalf of Lavon Paganini, MD,as directed by   Lavon Paganini, MD while in the presence of Lavon Paganini, MD.  I, Lavon Paganini, MD, have reviewed all documentation for this visit. The documentation on 08/14/21 for the exam, diagnosis, procedures, and orders are all accurate and complete.   Aylan Bayona, Dionne Bucy, MD, MPH [facility]

## 2021-08-13 NOTE — Assessment & Plan Note (Signed)
Chronic and well controlled Continue lexapro at current dose Encourage therapy

## 2021-08-13 NOTE — Assessment & Plan Note (Signed)
Recommend low carb diet °Recheck A1c  °

## 2021-08-13 NOTE — Assessment & Plan Note (Signed)
Well controlled Continue current medications Reviewed recent metabolic panel F/u in 6 months  

## 2021-08-13 NOTE — Assessment & Plan Note (Signed)
Chronic and stable Continue trazodone prn

## 2021-08-14 ENCOUNTER — Encounter: Payer: Self-pay | Admitting: Family Medicine

## 2021-08-16 DIAGNOSIS — E7800 Pure hypercholesterolemia, unspecified: Secondary | ICD-10-CM | POA: Diagnosis not present

## 2021-08-16 DIAGNOSIS — R739 Hyperglycemia, unspecified: Secondary | ICD-10-CM | POA: Diagnosis not present

## 2021-08-17 LAB — LIPID PANEL
Chol/HDL Ratio: 2.8 ratio (ref 0.0–4.4)
Cholesterol, Total: 143 mg/dL (ref 100–199)
HDL: 51 mg/dL (ref >39)
LDL Chol Calc (NIH): 66 mg/dL (ref 0–99)
Triglycerides: 153 mg/dL — ABNORMAL HIGH (ref 0–149)
VLDL Cholesterol Cal: 26 mg/dL (ref 5–40)

## 2021-08-17 LAB — HEMOGLOBIN A1C
Est. average glucose Bld gHb Est-mCnc: 111 mg/dL
Hgb A1c MFr Bld: 5.5 % (ref 4.8–5.6)

## 2021-08-19 DIAGNOSIS — I480 Paroxysmal atrial fibrillation: Secondary | ICD-10-CM | POA: Diagnosis not present

## 2021-09-08 ENCOUNTER — Ambulatory Visit (INDEPENDENT_AMBULATORY_CARE_PROVIDER_SITE_OTHER): Payer: Medicare HMO | Admitting: Family Medicine

## 2021-09-08 DIAGNOSIS — Z0000 Encounter for general adult medical examination without abnormal findings: Secondary | ICD-10-CM | POA: Diagnosis not present

## 2021-09-08 NOTE — Progress Notes (Addendum)
Subjective:   Erin Romero is a 70 y.o. female who presents for Medicare Annual (Subsequent) preventive examination. I connected with  [name] on 09/08/21 by a telemedicine enabled telemedicine application and verified that I am speaking with the correct person using two identifiers.   I discussed the limitations of evaluation and management by telemedicine. The patient expressed understanding and agreed to proceed.    Participated parties: Donnis Pecha, CMA  Non-face to face 40 minutes   This was a telephone visit     Objective:    There were no vitals filed for this visit. There is no height or weight on file to calculate BMI.  Advanced Directives 09/08/2021 06/10/2021 10/31/2020 10/23/2020 07/24/2020 07/24/2020 06/20/2020  Does Patient Have a Medical Advance Directive? Yes Yes No Yes Yes Yes Yes  Type of Advance Directive Living will Living will - Living will;Healthcare Power of [place];Living will Living will;Healthcare Power of [place];Living will  Does patient want to make changes to medical advance directive? No - Patient declined - - - - - -  Copy of Press photographer in Chart? - - - No - copy requested - - Yes - validated most recent copy scanned in chart (See row information)  Would patient like information on creating a medical advance directive? - - No - Patient declined - - - -    Current Medications (verified) Outpatient Encounter Medications as of 09/08/2021  Medication Sig   acetaminophen (TYLENOL) 500 MG tablet Take 1,500 mg by mouth every 4 (four) hours as needed for moderate pain.    amLODipine (NORVASC) 2.5 MG tablet Take 1 tablet (2.5 mg total) by mouth daily.   atorvastatin (LIPITOR) 10 MG tablet Take 1 tablet (10 mg total) by mouth daily.   bisoprolol-hydrochlorothiazide (ZIAC) 10-6.25 MG tablet Take 1 tablet by mouth daily.   Calcium Carb-Cholecalciferol  (OYSTER SHELL CALCIUM + D PO) Take 1 tablet by mouth daily. 683m calcium plus 20 mg Vit D   Cholecalciferol (VITAMIN D-3) 25 MCG (1000 UT) CAPS Take 1,000 Units by mouth daily.    escitalopram (LEXAPRO) 10 MG tablet Take 1 tablet (10 mg total) by mouth daily.   hydrochlorothiazide (HYDRODIURIL) 12.5 MG tablet Take 1 tablet (12.5 mg total) by mouth daily.   lisinopril (ZESTRIL) 20 MG tablet Take 1 tablet (20 mg total) by mouth daily.   omeprazole (PRILOSEC) 20 MG capsule Take 1 capsule (20 mg total) by mouth daily.   potassium chloride SA (KLOR-CON) 20 MEQ tablet Take 1 tablet (20 mEq total) by mouth daily.   traZODone (DESYREL) 50 MG tablet Take 1 tablet (50 mg total) by mouth daily.   warfarin (COUMADIN) 1 MG tablet Take 1 tablet by mouth daily.   warfarin (COUMADIN) 2 MG tablet Take 2 mg by mouth as directed. 1.5 mg x 5 days and 213mx 2 days   No facility-administered encounter medications on file as of 09/08/2021.    Allergies (verified) Taxotere [docetaxel] and Sulfa antibiotics   History: Past Medical History:  Diagnosis Date   Anxiety    [place]   [facility] [place]   right breast   Dysrhythmia    Fibrocystic breast disease 2005   GERD (gastroesophageal reflux disease)    Hypertension 2005   Lump or mass in breast 2014   right mastectomy   [facility] of central [place] of [place] [facility] [place]  Stage II,T2, N1a. ER/PR 90%, HER-2/neu: Not overexpressed. Mastectomy with sentinel node biopsy. 2 sentinel nodes negative, non-sentinel node w/ 5 mm macrometastatic foci in non-sentinel node.  Received post mastectomy radiation.    Past Surgical History:  Procedure Laterality Date   ABDOMINAL HYSTERECTOMY  1999   BREAST BIOPSY Left 2015   BENIGN FIBROTIC BREAST TISSUE WITH CLUSTERED APOCRINE   BREAST BIOPSY Left 11/02/2019   affirm bx, x marker, path pending   BREAST SURGERY Right 2005,2014   biopsy   BREAST SURGERY Right 2014    mastectomy   COLONOSCOPY  2002   COLONOSCOPY WITH PROPOFOL N/A 10/16/2015   Procedure: COLONOSCOPY WITH PROPOFOL;  Surgeon: Robert Bellow, MD;  Location: [facility] ENDOSCOPY;  Service: Endoscopy;  Laterality: N/A;   COLONOSCOPY WITH PROPOFOL N/A 10/31/2020   Procedure: COLONOSCOPY WITH PROPOFOL;  Surgeon: [name], MD;  Location: [facility] ENDOSCOPY;  Service: Endoscopy;  Laterality: N/A;   ESOPHAGOGASTRODUODENOSCOPY (EGD) WITH PROPOFOL N/A 10/31/2020   Procedure: ESOPHAGOGASTRODUODENOSCOPY (EGD) WITH PROPOFOL;  Surgeon: [name], MD;  Location: [facility] ENDOSCOPY;  Service: Endoscopy;  Laterality: N/A;   GIVENS CAPSULE STUDY N/A 12/27/2020   Procedure: GIVENS CAPSULE STUDY;  Surgeon: [name], MD;  Location: [facility] ENDOSCOPY;  Service: Endoscopy;  Laterality: N/A;   KNEE SURGERY Left 2005   MASTECTOMY Right 2014   radiation chemo   SIMPLE MASTECTOMY WITH AXILLARY SENTINEL NODE BIOPSY Left 11/30/2019   Procedure: SIMPLE MASTECTOMY WITH AXILLARY SENTINEL NODE BIOPSY;  Surgeon: Robert Bellow, MD;  Location: [facility] ORS;  Service: General;  Laterality: Left;   thumb surg Right 2004   TONSILLECTOMY     age of 64   Family History  Problem Relation Age of Onset   Heart disease Father    Hypertension Sister    Cancer Mother        cervical   Cancer Other        ovarian,liver,colon cancer-no family member listed   Osteoporosis Paternal Aunt    Osteoporosis Paternal Grandmother    Social History   Socioeconomic History   Marital status: Widowed    Spouse name: Desirre Eickhoff   Number of children: 0   Years of education: 12   Highest education level: 12th grade  Occupational History   Occupation: retired  Tobacco Use   Smoking status: Never   Smokeless tobacco: Never  Vaping Use   Vaping Use: Never used  Substance and Sexual Activity   Alcohol use: No   Drug use: No   Sexual activity: Not Currently  Other Topics Concern   Not on file  Social History  Narrative   Not on file   Social Determinants of Health   Financial Resource Strain: Low Risk    Difficulty of Paying Living Expenses: Not hard at all  Food Insecurity: No Food Insecurity   Worried About Charity fundraiser in the Last Year: Never true   [place] in the Last Year: Never true  Transportation Needs: No Transportation Needs   Lack of Transportation (Medical): No   Lack of Transportation (Non-Medical): No  Physical Activity: Insufficiently Active   Days of Exercise per Week: 2 days   Minutes of Exercise per Session: 20 min  Stress: No Stress Concern Present   Feeling of Stress : Not at all  Social Connections: Moderately Integrated   Frequency of Communication with Friends and Family: Twice a week   Frequency of Social Gatherings with Friends and Family: Twice a  week   Attends Religious Services: 1 to 4 times per year   Active Member of Clubs or Organizations: No   Attends Archivist Meetings: 1 to 4 times per year   Marital Status: Widowed    Tobacco Counseling Counseling given: Not Answered   Clinical Intake:  Pre-visit preparation completed: Yes  Pain : No/denies pain     Nutritional Risks: None Diabetes: No  How often do you need to have someone help you when you read instructions, pamphlets, or other written materials from your doctor or pharmacy?: 1 - Never What is the last grade level you completed in school?: 12th    Interpreter Needed?: No      Activities of Daily Living In your present state of health, do you have any difficulty performing the following activities: 09/08/2021 08/13/2021  Hearing? N -  Vision? Y Y  Comment glasses -  Difficulty concentrating or making decisions? N N  Walking or climbing stairs? N Y  Dressing or bathing? N -  Doing errands, shopping? N -  Preparing Food and eating ? N -  Using the Toilet? N -  In the past six months, have you accidently leaked urine? N -  Do you have problems with  loss of bowel control? N -  Managing your Medications? N -  Managing your Finances? N -  Housekeeping or managing your Housekeeping? N -  Some recent data might be hidden    Patient Care Team: [name], FNP as PCP - General (Family Medicine) Bary Castilla Forest Gleason, MD as Consulting Physician (General Surgery) Ubaldo Glassing Javier Docker, MD as Consulting Physician (Cardiology) Earlie Server, MD as Consulting Physician (Oncology) Cleaster Corin, OD (Optometry)  Indicate any recent Medical Services you may have received from other than [facility] providers in the past year (date may be approximate).     Assessment:   This is a routine wellness examination for [place].  Hearing/Vision screen No results found.  Dietary issues and exercise activities discussed: Current Exercise Habits: The patient does not participate in regular exercise at present   Goals Addressed   None   Depression Screen PHQ 2/9 Scores 08/13/2021 02/13/2021 08/08/2020 06/20/2020 02/09/2020 06/20/2019 06/15/2018  PHQ - 2 Score 0 1 1 1  0 0 1  PHQ- 9 Score 3 4 4  - 1 - -    Fall Risk Fall Risk  09/08/2021 08/13/2021 02/13/2021 06/20/2020 06/20/2019  Falls in the past year? 0 0 0 0 0  Number falls in past yr: 0 0 0 0 -  Injury with Fall? 0 0 0 0 -  Risk for fall due to : No Fall Risks No Fall Risks No Fall Risks - -  Follow up - - Falls evaluation completed - -    FALL RISK PREVENTION PERTAINING TO THE HOME:  Any stairs in or around the home? No  If so, are there any without handrails? No  Home free of loose throw rugs in walkways, pet beds, electrical cords, etc? No  Adequate lighting in your home to reduce risk of falls? Yes   ASSISTIVE DEVICES UTILIZED TO PREVENT FALLS:  Life alert? No  Use of a cane, walker or w/c? Yes  Grab bars in the bathroom? Yes  Shower chair or bench in shower? No  Elevated toilet seat or a handicapped toilet? No  Cognitive Function:     6CIT Screen 09/08/2021 06/20/2019 06/15/2018 06/25/2017  What Year?  0 points 0 points 0 points 0 points  What month?  0 points 0 points 0 points 0 points  What time? 0 points 0 points 0 points 0 points  Count back from 20 0 points 0 points 0 points 0 points  Months in reverse 0 points 0 points 0 points 0 points  Repeat phrase 0 points 0 points 0 points 4 points  Total Score 0 0 0 4    Immunizations Immunization History  Administered Date(s) Administered   Fluad Quad(high Dose 65+) 09/19/2019   Influenza, High Dose Seasonal PF 09/14/2018   Influenza,inj,Quad PF,6+ Mos 09/06/2015, 09/21/2017   Pneumococcal Conjugate-13 08/22/2015   Pneumococcal Polysaccharide-23 09/03/2016, 09/21/2017   Tdap 08/08/2020    TDAP status: Up to date  Flu Vaccine status: Due, Education has been provided regarding the importance of this vaccine. Advised may receive this vaccine at local pharmacy or Health Dept. Aware to provide a copy of the vaccination record if obtained from local pharmacy or Health Dept. Verbalized acceptance and understanding.  Pneumococcal vaccine status: Due, Education has been provided regarding the importance of this vaccine. Advised may receive this vaccine at local pharmacy or Health Dept. Aware to provide a copy of the vaccination record if obtained from local pharmacy or Health Dept. Verbalized acceptance and understanding.  Covid-19 vaccine status: Completed vaccines  Qualifies for Shingles Vaccine? No   Zostavax completed No   Shingrix Completed?: No.    Education has been provided regarding the importance of this vaccine. Patient has been advised to call insurance company to determine out of pocket expense if they have not yet received this vaccine. Advised may also receive vaccine at local pharmacy or Health Dept. Verbalized acceptance and understanding.  Screening Tests Health Maintenance  Topic Date Due   COVID-19 Vaccine (1) Never done   Zoster Vaccines- Shingrix (1 of 2) Never done   DEXA SCAN  02/13/2022 (Originally 09/08/2020)    MAMMOGRAM  10/25/2021   TETANUS/TDAP  08/08/2030   COLONOSCOPY (Pts 45-31yr Insurance coverage will need to be confirmed)  10/31/2030   Hepatitis C Screening  Completed   HPV VACCINES  Aged Out   INFLUENZA VACCINE  Discontinued    Health Maintenance  Health Maintenance Due  Topic Date Due   COVID-19 Vaccine (1) Never done   Zoster Vaccines- Shingrix (1 of 2) Never done    Colorectal cancer screening: Type of screening: Colonoscopy. Completed 10/31/2020. Repeat every 10 years  Mammogram status: Completed 10/26/2019. Repeat every year 2  Bone Density status: Completed 09/08/2018. Results reflect: Bone density results: NORMAL. Repeat every 2 years.  Lung Cancer Screening: (Low Dose CT Chest recommended if Age 71-80years, 30 pack-year currently smoking OR have quit w/in 15years.) does not qualify.   Lung Cancer Screening Referral:   Additional Screening:  Hepatitis C Screening: does qualify; Completed 06/25/2017  Vision Screening: Recommended annual ophthalmology exams for early detection of glaucoma and other disorders of the eye. Is the patient up to date with their annual eye exam?  Yes  Who is the provider or what is the name of the office in which the patient attends annual eye exams? [facility] is not established with a provider, would they like to be referred to a provider to establish care? No .   Dental Screening: Recommended annual dental exams for proper oral hygiene  Community Resource Referral / Chronic Care Management: CRR required this visit?  No   CCM required this visit?  No      Plan:     I have personally  reviewed and noted the following in the patient's chart:   Medical and social history Use of alcohol, tobacco or illicit drugs  Current medications and supplements including opioid prescriptions.  Functional ability and status Nutritional status Physical activity Advanced directives List of other physicians Hospitalizations,  surgeries, and ER visits in previous 12 months Vitals Screenings to include cognitive, depression, and falls Referrals and appointments  In addition, I have reviewed and discussed with patient certain preventive protocols, quality metrics, and best practice recommendations. A written personalized care plan for preventive services as well as general preventive health recommendations were provided to patient.     Webb Silversmith [place], [place]   09/08/2021   Nurse Notes: Non- face to face 40 minutes    Ms. Eulas Post , Thank you for taking time to come for your Medicare Wellness Visit. I appreciate your ongoing commitment to your health goals. Please review the following plan we discussed and let me know if I can assist you in the future.   These are the goals we discussed:  Goals      DIET - REDUCE SUGAR INTAKE     Recommend cutting out sugar and ice cream from daily diet and substituting for sugar free snacks.      Exercise 150 minutes per week (moderate activity)     Increase water intake     Recommend increasing water intake to 4 glasses of water a day.         This is a list of the screening recommended for you and due dates:  Health Maintenance  Topic Date Due   COVID-19 Vaccine (1) Never done   Zoster (Shingles) Vaccine (1 of 2) Never done   DEXA scan (bone density measurement)  02/13/2022*   Mammogram  10/25/2021   Tetanus Vaccine  08/08/2030   Colon Cancer Screening  10/31/2030   Hepatitis C Screening: USPSTF Recommendation to screen - Ages 18-79 yo.  Completed   HPV Vaccine  Aged Out   Flu Shot  Discontinued  *Topic was postponed. The date shown is not the original due date.

## 2021-09-19 DIAGNOSIS — I480 Paroxysmal atrial fibrillation: Secondary | ICD-10-CM | POA: Diagnosis not present

## 2021-10-02 ENCOUNTER — Encounter: Payer: Self-pay | Admitting: Gastroenterology

## 2021-10-02 ENCOUNTER — Ambulatory Visit: Payer: Medicare HMO | Admitting: Gastroenterology

## 2021-10-02 ENCOUNTER — Other Ambulatory Visit: Payer: Self-pay

## 2021-10-02 VITALS — BP 140/83 | HR 83 | Temp 98.4°F | Wt 235.4 lb

## 2021-10-02 DIAGNOSIS — R1319 Other dysphagia: Secondary | ICD-10-CM | POA: Diagnosis not present

## 2021-10-02 NOTE — Progress Notes (Signed)
Vonda Antigua, MD [address]  [place]  [place], [place] [address]  Main: [phone]  Fax: [phone]   Primary Care Physician: [name], FNP   Chief complaint: Dysphagia  HPI: Erin Romero is a 70 y.o. female here for follow-up of dysphagia and Schatzki's ring.  Patient reports continued dysphagia despite dilation on previous procedure.  No abdominal pain, nausea or vomiting.  No altered bowel habits.  No weight loss.  Is taking omeprazole once a day and denies any breakthrough symptoms  ROS: All ROS reviewed and negative except as per HPI   Past Medical History:  Diagnosis Date   Anxiety    Atrial fibrillation ([place]) 2014   Breast cancer ([place]) 2014   right breast   Dysrhythmia    Fibrocystic breast disease 2005   GERD (gastroesophageal reflux disease)    Hypertension 2005   Lump or mass in breast 2014   right mastectomy   Malignant neoplasm of central portion of female breast ([place]) 01/24/2013   Stage II,T2, N1a. ER/PR 90%, HER-2/neu: Not overexpressed. Mastectomy with sentinel node biopsy. 2 sentinel nodes negative, non-sentinel node w/ 5 mm macrometastatic foci in non-sentinel node.  Received post mastectomy radiation.     Past Surgical History:  Procedure Laterality Date   ABDOMINAL HYSTERECTOMY  1999   BREAST BIOPSY Left 2015   BENIGN FIBROTIC BREAST TISSUE WITH CLUSTERED APOCRINE   BREAST BIOPSY Left 11/02/2019   affirm bx, x marker, path pending   BREAST SURGERY Right 2005,2014   biopsy   BREAST SURGERY Right 2014   mastectomy   COLONOSCOPY  2002   COLONOSCOPY WITH PROPOFOL N/A 10/16/2015   Procedure: COLONOSCOPY WITH PROPOFOL;  Surgeon: Robert Bellow, MD;  Location: [facility] END[facility]COPY;  Service: Endoscopy;  Laterality: N/A;   COLONOSCOPY WITH PROPOFOL N/A 10/31/2020   Procedure: COLONOSCOPY WITH PROPOFOL;  Surgeon: [name], MD;  Location: [facility];  Service: Endoscopy;  Laterality: N/A;    ESOPHAGOGASTRODUODENOSCOPY (EGD) WITH PROPOFOL N/A 10/31/2020   Procedure: ESOPHAGOGASTRODUODENOSCOPY (EGD) WITH PROPOFOL;  Surgeon: [name], MD;  Location: [facility];  Service: Endoscopy;  Laterality: N/A;   GIVENS CAPSULE STUDY N/A 12/27/2020   Procedure: GIVENS CAPSULE STUDY;  Surgeon: [name], MD;  Location: [facility];  Service: Endoscopy;  Laterality: N/A;   KNEE SURGERY Left 2005   MASTECTOMY Right 2014   radiation chemo   SIMPLE MASTECTOMY WITH AXILLARY SENTINEL NODE BIOPSY Left 11/30/2019   Procedure: SIMPLE MASTECTOMY WITH AXILLARY SENTINEL NODE BIOPSY;  Surgeon: Robert Bellow, MD;  Location: [facility] ORS;  Service: General;  Laterality: Left;   thumb surg Right 2004   TONSILLECTOMY     age of 3    Prior to Admission medications   Medication Sig Start Date End Date Taking? Authorizing Provider  acetaminophen (TYLENOL) 500 MG tablet Take 1,500 mg by mouth every 4 (four) hours as needed for moderate pain.    Yes [provider]  amLODipine (NORVASC) 2.5 MG tablet Take 1 tablet (2.5 mg total) by mouth daily. 08/13/21  Yes Bacigalupo, Dionne Bucy, MD  atorvastatin (LIPITOR) 10 MG tablet Take 1 tablet (10 mg total) by mouth daily. 08/13/21  Yes Bacigalupo, Dionne Bucy, MD  bisoprolol-hydrochlorothiazide [facility]) 10-6.25 MG tablet Take 1 tablet by mouth daily. 08/13/21  Yes Bacigalupo, Dionne Bucy, MD  Calcium Carb-Cholecalciferol (OYSTER SHELL CALCIUM + D PO) Take 1 tablet by mouth daily. 682m calcium plus 20 mg Vit D   Yes [provider]  Cholecalciferol (VITAMIN D-3) 25 MCG (1000 UT) CAPS Take 1,000 Units by mouth daily.    Yes [provider]  escitalopram (LEXAPRO) 10 MG tablet Take 1 tablet (10 mg total) by mouth daily. 08/13/21  Yes Bacigalupo, Dionne Bucy, MD  hydrochlorothiazide (HYDRODIURIL) 12.5 MG tablet Take 1 tablet (12.5 mg total) by mouth daily. 08/13/21  Yes Bacigalupo, Dionne Bucy, MD  lisinopril (ZESTRIL) 20 MG tablet Take 1  tablet (20 mg total) by mouth daily. 08/13/21  Yes Bacigalupo, Dionne Bucy, MD  omeprazole (PRILOSEC) 20 MG capsule Take 1 capsule (20 mg total) by mouth daily. 08/13/21  Yes Bacigalupo, Dionne Bucy, MD  potassium chloride SA (KLOR-CON) 20 MEQ tablet Take 1 tablet (20 mEq total) by mouth daily. 08/13/21  Yes Bacigalupo, Dionne Bucy, MD  traZODone (DESYREL) 50 MG tablet Take 1 tablet (50 mg total) by mouth daily. 08/13/21  Yes Bacigalupo, Dionne Bucy, MD  warfarin (COUMADIN) 1 MG tablet Take 1 tablet by mouth daily. 08/30/20  Yes [provider]  warfarin (COUMADIN) 2 MG tablet Take 2 mg by mouth as directed. 1.5 mg x 5 days and 61m x 2 days 11/16/19  Yes [provider]    Family History  Problem Relation Age of Onset   Heart disease Father    Hypertension Sister    Cancer Mother        cervical   Cancer Other        ovarian,liver,colon cancer-no family member listed   Osteoporosis Paternal Aunt    Osteoporosis Paternal Grandmother      Social History   Tobacco Use   Smoking status: Never   Smokeless tobacco: Never  Vaping Use   Vaping Use: Never used  Substance Use Topics   Alcohol use: No   Drug use: No    Allergies as of 10/02/2021 - Review Complete 10/02/2021  Allergen Reaction Noted   Taxotere [docetaxel] Itching and Rash 04/12/2015   Sulfa antibiotics Hives 03/28/2013    Physical Examination:  Constitutional: General:   Alert,  Well-developed, well-nourished, pleasant and cooperative in NAD BP 140/83   Pulse 83   Temp 98.4 F (36.9 C) (Oral)   Wt 235 lb 6.4 oz (106.8 kg)   BMI 40.41 kg/m   Respiratory: Normal respiratory effort  Gastrointestinal:  Soft, non-tender and non-distended without masses, hepatosplenomegaly or hernias noted.  No guarding or rebound tenderness.     Cardiac: No clubbing or edema.  No cyanosis. Normal posterior tibial pedal pulses noted.  Psych:  Alert and cooperative. Normal mood and affect.  Musculoskeletal:  Normal gait. Head  normocephalic, atraumatic. Symmetrical without gross deformities. 5/5 Lower extremity strength bilaterally.  Skin: Warm. Intact without significant lesions or rashes. No jaundice.  Neck: Supple, trachea midline  Lymph: No cervical lymphadenopathy  Psych:  Alert and oriented x3, Alert and cooperative. Normal mood and affect.  Labs: CMP     Component Value Date/Time   NA 137 06/07/2021 1103   NA 140 08/10/2020 1137   NA 142 12/20/2014 1100   K 3.4 (L) 06/07/2021 1103   K 3.5 12/20/2014 1100   CL 101 06/07/2021 1103   CL 101 12/20/2014 1100   CO2 24 06/07/2021 1103   CO2 32 12/20/2014 1100   GLUCOSE 170 (H) 06/07/2021 1103   GLUCOSE 116 (H) 12/20/2014 1100   BUN 14 06/07/2021 1103   BUN 12 08/10/2020 1137   BUN 13 12/20/2014 1100   CREATININE 0.92 06/07/2021 1103   CREATININE 0.86  12/20/2014 1100   CALCIUM 8.8 (L) 06/07/2021 1103   CALCIUM 8.9 12/20/2014 1100   PROT 7.8 06/07/2021 1103   PROT 7.1 08/10/2020 1137   PROT 7.4 12/20/2014 1100   ALBUMIN 3.6 06/07/2021 1103   ALBUMIN 3.9 08/10/2020 1137   ALBUMIN 3.4 12/20/2014 1100   AST 22 06/07/2021 1103   AST 16 12/20/2014 1100   ALT 12 06/07/2021 1103   ALT 21 12/20/2014 1100   ALKPHOS 78 06/07/2021 1103   ALKPHOS 73 12/20/2014 1100   BILITOT 0.9 06/07/2021 1103   BILITOT 0.4 08/10/2020 1137   BILITOT 0.5 12/20/2014 1100   GFRNONAA >60 06/07/2021 1103   GFRNONAA >60 12/20/2014 1100   GFRNONAA >60 06/15/2014 1020   GFRAA 75 08/10/2020 1137   GFRAA >60 12/20/2014 1100   GFRAA >60 06/15/2014 1020   Lab Results  Component Value Date   WBC 10.4 06/07/2021   HGB 13.7 06/07/2021   HCT 41.1 06/07/2021   MCV 86.3 06/07/2021   PLT 252 06/07/2021    Imaging Studies:   Assessment and Plan:   Erin Romero is a 70 y.o. y/o female here for follow-up of dysphagia  On last procedure Schatzki's ring was dilated to 16-1/2 mm with good heme effect, suggestive of effective dilation.  However, since patient is  reporting dysphagia currently and has been 1 year since last procedure, we discussed repeat EGD  In addition, her capsule study previously was inconclusive and capsule can be placed into the small bowel at the time of repeat EGD  In addition, she had a gastric antral polyp that was hyperplastic but was not greater than 0.5 cm (at which size removal is recommended).  We discussed that this can be reevaluated and possibly removed during the procedure  After discussion of above in detail, patient does not want to schedule the procedure at this time despite her dysphagia symptoms.  She states she will call us when she is ready to schedule, but is willing to make a clinic follow-up recall in 6 months to rediscuss this if she has not already called [place] by then and scheduled for EGD  She is on low-dose PPI once a day which controls her reflux well and literature has shown that patients with Schatzki's ring have better outcomes on PPI than without in relation to needing repeat dilations.  Therefore, benefits of medication outweigh risks at this time  (Risks of PPI use were discussed with patient including bone loss, C. Diff diarrhea, pneumonia, infections, CKD, electrolyte abnormalities.  Pt. Verbalizes understanding and chooses to continue the medication.)   I have encouraged her to call us once she is ready to schedule her EGD    Dr Vonda Antigua

## 2021-10-16 DIAGNOSIS — I4891 Unspecified atrial fibrillation: Secondary | ICD-10-CM | POA: Diagnosis not present

## 2021-10-16 DIAGNOSIS — E7800 Pure hypercholesterolemia, unspecified: Secondary | ICD-10-CM | POA: Diagnosis not present

## 2021-10-16 DIAGNOSIS — I10 Essential (primary) hypertension: Secondary | ICD-10-CM | POA: Diagnosis not present

## 2021-10-16 DIAGNOSIS — I480 Paroxysmal atrial fibrillation: Secondary | ICD-10-CM | POA: Diagnosis not present

## 2021-10-16 DIAGNOSIS — R0602 Shortness of breath: Secondary | ICD-10-CM | POA: Diagnosis not present

## 2021-10-30 DIAGNOSIS — R0602 Shortness of breath: Secondary | ICD-10-CM | POA: Diagnosis not present

## 2021-11-08 DIAGNOSIS — I480 Paroxysmal atrial fibrillation: Secondary | ICD-10-CM | POA: Diagnosis not present

## 2021-11-08 DIAGNOSIS — I272 Pulmonary hypertension, unspecified: Secondary | ICD-10-CM | POA: Diagnosis not present

## 2021-11-08 DIAGNOSIS — I10 Essential (primary) hypertension: Secondary | ICD-10-CM | POA: Diagnosis not present

## 2021-11-21 DIAGNOSIS — I480 Paroxysmal atrial fibrillation: Secondary | ICD-10-CM | POA: Diagnosis not present

## 2021-12-03 ENCOUNTER — Other Ambulatory Visit: Payer: Self-pay | Admitting: *Deleted

## 2021-12-03 DIAGNOSIS — D0512 Intraductal carcinoma in situ of left breast: Secondary | ICD-10-CM

## 2021-12-03 DIAGNOSIS — Z853 Personal history of malignant neoplasm of breast: Secondary | ICD-10-CM | POA: Diagnosis not present

## 2021-12-09 ENCOUNTER — Inpatient Hospital Stay: Payer: Medicare HMO | Attending: Oncology

## 2021-12-09 ENCOUNTER — Other Ambulatory Visit: Payer: Self-pay

## 2021-12-09 DIAGNOSIS — Z853 Personal history of malignant neoplasm of breast: Secondary | ICD-10-CM | POA: Insufficient documentation

## 2021-12-09 DIAGNOSIS — Z7901 Long term (current) use of anticoagulants: Secondary | ICD-10-CM | POA: Insufficient documentation

## 2021-12-09 DIAGNOSIS — Z9013 Acquired absence of bilateral breasts and nipples: Secondary | ICD-10-CM | POA: Insufficient documentation

## 2021-12-09 DIAGNOSIS — Z923 Personal history of irradiation: Secondary | ICD-10-CM | POA: Insufficient documentation

## 2021-12-09 DIAGNOSIS — R600 Localized edema: Secondary | ICD-10-CM | POA: Diagnosis not present

## 2021-12-09 DIAGNOSIS — Z79899 Other long term (current) drug therapy: Secondary | ICD-10-CM | POA: Insufficient documentation

## 2021-12-09 DIAGNOSIS — D509 Iron deficiency anemia, unspecified: Secondary | ICD-10-CM | POA: Diagnosis not present

## 2021-12-09 DIAGNOSIS — M810 Age-related osteoporosis without current pathological fracture: Secondary | ICD-10-CM | POA: Diagnosis not present

## 2021-12-09 DIAGNOSIS — L309 Dermatitis, unspecified: Secondary | ICD-10-CM | POA: Insufficient documentation

## 2021-12-09 DIAGNOSIS — D0512 Intraductal carcinoma in situ of left breast: Secondary | ICD-10-CM

## 2021-12-09 LAB — CBC WITH DIFFERENTIAL/PLATELET
Abs Immature Granulocytes: 0.04 10*3/uL (ref 0.00–0.07)
Basophils Absolute: 0.1 10*3/uL (ref 0.0–0.1)
Basophils Relative: 1 %
Eosinophils Absolute: 0.1 10*3/uL (ref 0.0–0.5)
Eosinophils Relative: 1 %
HCT: 37.2 % (ref 36.0–46.0)
Hemoglobin: 12 g/dL (ref 12.0–15.0)
Immature Granulocytes: 1 %
Lymphocytes Relative: 17 %
Lymphs Abs: 1.5 10*3/uL (ref 0.7–4.0)
MCH: 27.1 pg (ref 26.0–34.0)
MCHC: 32.3 g/dL (ref 30.0–36.0)
MCV: 84.2 fL (ref 80.0–100.0)
Monocytes Absolute: 0.7 10*3/uL (ref 0.1–1.0)
Monocytes Relative: 8 %
Neutro Abs: 6.5 10*3/uL (ref 1.7–7.7)
Neutrophils Relative %: 72 %
Platelets: 230 10*3/uL (ref 150–400)
RBC: 4.42 MIL/uL (ref 3.87–5.11)
RDW: 15.7 % — ABNORMAL HIGH (ref 11.5–15.5)
WBC: 8.9 10*3/uL (ref 4.0–10.5)
nRBC: 0 % (ref 0.0–0.2)

## 2021-12-09 LAB — IRON AND TIBC
Iron: 58 ug/dL (ref 28–170)
Saturation Ratios: 14 % (ref 10.4–31.8)
TIBC: 423 ug/dL (ref 250–450)
UIBC: 365 ug/dL

## 2021-12-09 LAB — COMPREHENSIVE METABOLIC PANEL
ALT: 12 U/L (ref 0–44)
AST: 25 U/L (ref 15–41)
Albumin: 3.7 g/dL (ref 3.5–5.0)
Alkaline Phosphatase: 69 U/L (ref 38–126)
Anion gap: 12 (ref 5–15)
BUN: 13 mg/dL (ref 8–23)
CO2: 24 mmol/L (ref 22–32)
Calcium: 9.1 mg/dL (ref 8.9–10.3)
Chloride: 101 mmol/L (ref 98–111)
Creatinine, Ser: 0.68 mg/dL (ref 0.44–1.00)
GFR, Estimated: >60 mL/min (ref >60)
Glucose, Bld: 164 mg/dL — ABNORMAL HIGH (ref 70–99)
Potassium: 3.3 mmol/L — ABNORMAL LOW (ref 3.5–5.1)
Sodium: 137 mmol/L (ref 135–145)
Total Bilirubin: 0.9 mg/dL (ref 0.3–1.2)
Total Protein: 8.1 g/dL (ref 6.5–8.1)

## 2021-12-09 LAB — FERRITIN: Ferritin: 12 ng/mL (ref 11–307)

## 2021-12-10 LAB — CANCER ANTIGEN 27.29: CA 27.29: 20.1 U/mL (ref 0.0–38.6)

## 2021-12-11 ENCOUNTER — Inpatient Hospital Stay: Payer: Medicare HMO | Admitting: Oncology

## 2021-12-11 ENCOUNTER — Other Ambulatory Visit: Payer: Self-pay

## 2021-12-11 ENCOUNTER — Encounter: Payer: Self-pay | Admitting: Oncology

## 2021-12-11 VITALS — BP 158/70 | HR 72 | Temp 98.6°F | Resp 18 | Wt 240.7 lb

## 2021-12-11 DIAGNOSIS — R600 Localized edema: Secondary | ICD-10-CM

## 2021-12-11 DIAGNOSIS — L309 Dermatitis, unspecified: Secondary | ICD-10-CM | POA: Diagnosis not present

## 2021-12-11 DIAGNOSIS — Z9013 Acquired absence of bilateral breasts and nipples: Secondary | ICD-10-CM | POA: Diagnosis not present

## 2021-12-11 DIAGNOSIS — M810 Age-related osteoporosis without current pathological fracture: Secondary | ICD-10-CM | POA: Diagnosis not present

## 2021-12-11 DIAGNOSIS — D500 Iron deficiency anemia secondary to blood loss (chronic): Secondary | ICD-10-CM | POA: Diagnosis not present

## 2021-12-11 DIAGNOSIS — Z853 Personal history of malignant neoplasm of breast: Secondary | ICD-10-CM | POA: Diagnosis not present

## 2021-12-11 DIAGNOSIS — Z923 Personal history of irradiation: Secondary | ICD-10-CM | POA: Diagnosis not present

## 2021-12-11 DIAGNOSIS — Z79899 Other long term (current) drug therapy: Secondary | ICD-10-CM | POA: Diagnosis not present

## 2021-12-11 DIAGNOSIS — M8080XS Other osteoporosis with current pathological fracture, unspecified site, sequela: Secondary | ICD-10-CM | POA: Diagnosis not present

## 2021-12-11 DIAGNOSIS — D0512 Intraductal carcinoma in situ of left breast: Secondary | ICD-10-CM | POA: Diagnosis not present

## 2021-12-11 DIAGNOSIS — D509 Iron deficiency anemia, unspecified: Secondary | ICD-10-CM | POA: Diagnosis not present

## 2021-12-11 DIAGNOSIS — Z7901 Long term (current) use of anticoagulants: Secondary | ICD-10-CM | POA: Diagnosis not present

## 2021-12-11 NOTE — Progress Notes (Signed)
Pt here for follow up. No new concerns voiced.   

## 2021-12-12 DIAGNOSIS — Z853 Personal history of malignant neoplasm of breast: Secondary | ICD-10-CM | POA: Insufficient documentation

## 2021-12-12 DIAGNOSIS — D0512 Intraductal carcinoma in situ of left breast: Secondary | ICD-10-CM | POA: Insufficient documentation

## 2021-12-12 NOTE — Progress Notes (Signed)
[facility] day:  12/12/21   Chief Complaint: Erin Romero is a 70 y.o. female with stage IIB right breast cancer who is here for follow up.   PERTINENT ONCOLOGY HISTORY Patient previously followed up with Dr. Mike Gip.  Switch care to me on 08/16/2019. Extensive medical records were reviewed. history of stage IIB (T2N1M0) right breast cancer status post radical mastectomy on 2/3/2 medical 014.  Pathology revealed a 3 cm grade 2 invasive mammary carcinoma. 0/2 sentinel lymph nodes were positive.  In non-sentinel lymph nodes were positive for 0.5 cm macro metastasis with extracapsular extension.  DCIS was present.  ER >90 percent, PR>90%, HER-2/neu negative. Patient underwent 3 cycles of adjuvant chemotherapy with Taxotere and Cytoxan (03/08/2013 - 04/20/2013).  Treatment was complicated with erythromelalgia and an urticarial reaction.  And she did not receive any additional chemotherapy. Adjuvant radiation finished in July 2014.  Patient was started on letrozole in August 2014 and she self discontinued in October 2019. BCI showed low likelihood of benefit from extended endocrine therapy.  13.6% risk of late recurrence years 5-10. Patient has been off Letrozole since 09/2018.   Mammogram on 02/06/2014 was suspicious for left breast mass.  Left breast biopsy on 02/27/2014 was negative for malignancy.  Left-sided mammogram on 10/05/2017 was negative.   Left breast screening mammogram on 10/11/2018 revealed no evidence of malignancy.  #  Osteoporosis,  Bone density study on 06/05/2015 revealed osteopenia with a T score of -1.9 in the left femoral neck.  Bone density on 09/08/2018 revealed osteoporosis with a T-score of -2.5 in the left femoral neck and -2.2 in AP spine L2-L4.  Patient is on calcium and vitamin D.  She was on an oral bisphosphonate x 1 month.  Patient previosly declined Prolia. with T score of -2.5 of left femoral neck and -2.2 AP  spine L2-L4.  Previously declined Prolia.  She reports that she had tried some bone strengthen medication for 1 month last year, and did not tolerate.  She cannot remember the name of the medication.  # 01/30/19 left DCIS ER +, s/p mastectomy Patient was resumed on letrozole in December 2020.  After discussion with Dr. Bary Castilla, decision was made to stop letrozole given that patient has now had bilateral mastectomy and benefit of prophylaxis from AI is very small   #Osteoporosis, continue calcium and vitamin D supplementation. Previously discussed about bisphosphonate or Prolia treatment.  Patient declined.   #Family history of cancer, personal history of invasive breast cancer.  No newly diagnosed DCIS, I discussed with patient about genetic testing.  Patient declines.  INTERVAL HISTORY Erin Romero is a 70 y.o. female who has above history reviewed by me today presents for follow up visit for management of breast cancer Patient reports no new concerns today.  Past Medical History:  Diagnosis Date   Anxiety    Atrial fibrillation ([place]) 2014   Breast cancer [facility]) 2014   right breast   Dysrhythmia    Fibrocystic breast disease 2005   GERD (gastroesophageal reflux disease)    Hypertension 2005   Lump or mass in breast 2014   right mastectomy   Malignant neoplasm of central portion of female breast ([place]) 01/24/2013   Stage II,T2, N1a. ER/PR 90%, HER-2/neu: Not overexpressed. Mastectomy with sentinel node biopsy. 2 sentinel nodes negative, non-sentinel node w/ 5 mm macrometastatic foci in non-sentinel node.  Received post mastectomy radiation.     Past Surgical History:  Procedure Laterality  Date   ABDOMINAL HYSTERECTOMY  1999   BREAST BIOPSY Left 2015   BENIGN FIBROTIC BREAST TISSUE WITH CLUSTERED APOCRINE   BREAST BIOPSY Left 11/02/2019   affirm bx, x marker, path pending   BREAST SURGERY Right 2005,2014   biopsy   BREAST SURGERY Right 2014   mastectomy   COLONOSCOPY  2002    COLONOSCOPY WITH PROPOFOL N/A 10/16/2015   Procedure: COLONOSCOPY WITH PROPOFOL;  Surgeon: Robert Bellow, MD;  Location: [facility] ENDOSCOPY;  Service: Endoscopy;  Laterality: N/A;   COLONOSCOPY WITH PROPOFOL N/A 10/31/2020   Procedure: COLONOSCOPY WITH PROPOFOL;  Surgeon: [name], MD;  Location: [facility] ENDOSCOPY;  Service: Endoscopy;  Laterality: N/A;   ESOPHAGOGASTRODUODENOSCOPY (EGD) WITH PROPOFOL N/A 10/31/2020   Procedure: ESOPHAGOGASTRODUODENOSCOPY (EGD) WITH PROPOFOL;  Surgeon: [name], MD;  Location: [facility] ENDOSCOPY;  Service: Endoscopy;  Laterality: N/A;   GIVENS CAPSULE STUDY N/A 12/27/2020   Procedure: GIVENS CAPSULE STUDY;  Surgeon: [name], MD;  Location: [facility] ENDOSCOPY;  Service: Endoscopy;  Laterality: N/A;   KNEE SURGERY Left 2005   MASTECTOMY Right 2014   radiation chemo   SIMPLE MASTECTOMY WITH AXILLARY SENTINEL NODE BIOPSY Left 11/30/2019   Procedure: SIMPLE MASTECTOMY WITH AXILLARY SENTINEL NODE BIOPSY;  Surgeon: Robert Bellow, MD;  Location: [facility] ORS;  Service: General;  Laterality: Left;   thumb surg Right 2004   TONSILLECTOMY     age of 73    Family History  Problem Relation Age of Onset   Heart disease Father    Hypertension Sister    Cancer Mother        cervical   Cancer Other        ovarian,liver,colon cancer-no family member listed   Osteoporosis Paternal Aunt    Osteoporosis Paternal Grandmother     Social History:  reports that she has never smoked. She has never used smokeless tobacco. She reports that she does not drink alcohol and does not use drugs.    Allergies:  Allergies  Allergen Reactions   Taxotere [Docetaxel] Itching and Rash   Sulfa Antibiotics Hives    Current Medications: Current Outpatient Medications  Medication Sig Dispense Refill   acetaminophen (TYLENOL) 500 MG tablet Take 1,500 mg by mouth every 4 (four) hours as needed for moderate pain.      amLODipine (NORVASC) 2.5 MG tablet Take 1  tablet (2.5 mg total) by mouth daily. 90 tablet 1   atorvastatin (LIPITOR) 10 MG tablet Take 1 tablet (10 mg total) by mouth daily. 90 tablet 1   bisoprolol-hydrochlorothiazide (ZIAC) 10-6.25 MG tablet Take 1 tablet by mouth daily. 90 tablet 1   Calcium Carb-Cholecalciferol (OYSTER SHELL CALCIUM + D PO) Take 1 tablet by mouth daily. 645m calcium plus 20 mg Vit D     Cholecalciferol (VITAMIN D-3) 25 MCG (1000 UT) CAPS Take 1,000 Units by mouth daily.      escitalopram (LEXAPRO) 10 MG tablet Take 1 tablet (10 mg total) by mouth daily. 90 tablet 1   hydrochlorothiazide (HYDRODIURIL) 12.5 MG tablet Take 1 tablet (12.5 mg total) by mouth daily. 90 tablet 1   losartan (COZAAR) 100 MG tablet Take by mouth.     omeprazole (PRILOSEC) 20 MG capsule Take 1 capsule (20 mg total) by mouth daily. 90 capsule 3   potassium chloride SA (KLOR-CON) 20 MEQ tablet Take 1 tablet (20 mEq total) by mouth daily. 90 tablet 1   traZODone (DESYREL) 50 MG tablet Take 1 tablet (50 mg total) by  mouth daily. 90 tablet 1   warfarin (COUMADIN) 1 MG tablet Take 1 tablet by mouth daily.     warfarin (COUMADIN) 2 MG tablet Take 2 mg by mouth as directed. 1.5 mg x 5 days and 38m x 2 days     No current facility-administered medications for this visit.   Review of Systems  Constitutional:  Positive for fatigue. Negative for appetite change, chills and fever.  HENT:   Negative for hearing loss and voice change.   Eyes:  Negative for eye problems.  Respiratory:  Negative for chest tightness and cough.   Cardiovascular:  Negative for chest pain.  Gastrointestinal:  Negative for abdominal distention, abdominal pain and blood in stool.  Endocrine: Negative for hot flashes.  Genitourinary:  Negative for difficulty urinating and frequency.   Musculoskeletal:  Positive for arthralgias.  Skin:  Negative for itching and rash.  Neurological:  Negative for extremity weakness.  Hematological:  Negative for adenopathy.   Psychiatric/Behavioral:  Negative for confusion.    Physical Exam: Blood pressure (!) 158/70, pulse 72, temperature 98.6 F (37 C), resp. rate 18, weight 240 lb 11.2 oz (109.2 kg). Physical Exam Constitutional:      General: She is not in acute distress.    Appearance: She is not diaphoretic.     Comments: Morbid obese  HENT:     Head: Normocephalic and atraumatic.     Mouth/Throat:     Pharynx: No oropharyngeal exudate.  Eyes:     General: No scleral icterus.    Pupils: Pupils are equal, round, and reactive to light.  Cardiovascular:     Rate and Rhythm: Normal rate and regular rhythm.     Heart sounds: No murmur heard. Pulmonary:     Effort: Pulmonary effort is normal. No respiratory distress.  Abdominal:     General: There is no distension.     Palpations: Abdomen is soft.     Tenderness: There is no abdominal tenderness.  Musculoskeletal:        General: Normal range of motion.     Cervical back: Normal range of motion and neck supple.     Comments: Bilateral lower extremity 1+ edema, dermatitis  Skin:    General: Skin is warm and dry.     Findings: No erythema.  Neurological:     Mental Status: She is alert and oriented to person, place, and time.     Cranial Nerves: No cranial nerve deficit.  Psychiatric:        Mood and Affect: Affect normal.  No palpable chest wall mass at bilateral mastectomy sites.   RADIOGRAPHIC STUDIES: I have personally reviewed the radiological images as listed and agreed with the findings in the report. No results found.   Assessment:  CShekinah Pitonesis a 70y.o. female follows up for breast cancer. 1. Ductal carcinoma in situ (DCIS) of left breast   2. History of breast cancer   3. Iron deficiency anemia due to chronic blood loss   4. Other osteoporosis with current pathological fracture, sequela   5. Bilateral lower extremity edema    #01/24/2013 stage IIB (T2N1M0) right breast cancer s/p right mastectomy  ER/ PR+ and HER-2-  finished letrozole x 5 years, stopped in 09/2018- BCI showed low benefit of extended therapy #11/2019  left DCIS, high-grade s/p left mastectomy Off letrozole due to lack of benefit from aromatase inhibitor with bilateral mastectomy.  Patient follows up with Dr. BBary Castillaand recently had a breast examination. Labs  reviewed and discussed with patient.  Continue surveillance.  #Osteoporosis,Recommend patient to continue calcium and vitamin D supplementation.  She is overdue for bone density and I recommend patient to discussed with primary care provider. She previously declines bisphosphonate treatments.  #Iron deficiency anemia, she is off iron supplementation.  Patient has stable hemoglobin.  Iron panel is within normal limits.ferritin is trending low.  Recommend patient to resume Iron supplementation once daily.  #Bilateral lower extremity edema with skin dermatitis likely due to vein insufficiency. This may be due to her morbid obesity. Recommend life style modification and further discuss with pcp.  Patient declines vascular surgery referral.   We spent sufficient time to discuss many aspect of care, questions were answered to patient's satisfaction.   She will follow-up  6 months Earlie Server, MD  12/12/2021 , 9:09 AM

## 2021-12-24 DIAGNOSIS — I480 Paroxysmal atrial fibrillation: Secondary | ICD-10-CM | POA: Diagnosis not present

## 2022-01-13 ENCOUNTER — Other Ambulatory Visit: Payer: Self-pay | Admitting: Family Medicine

## 2022-01-13 DIAGNOSIS — F419 Anxiety disorder, unspecified: Secondary | ICD-10-CM

## 2022-01-13 DIAGNOSIS — G4700 Insomnia, unspecified: Secondary | ICD-10-CM

## 2022-01-21 DIAGNOSIS — I480 Paroxysmal atrial fibrillation: Secondary | ICD-10-CM | POA: Diagnosis not present

## 2022-02-05 ENCOUNTER — Other Ambulatory Visit: Payer: Self-pay | Admitting: Family Medicine

## 2022-02-05 DIAGNOSIS — E7800 Pure hypercholesterolemia, unspecified: Secondary | ICD-10-CM

## 2022-02-19 DIAGNOSIS — I480 Paroxysmal atrial fibrillation: Secondary | ICD-10-CM | POA: Diagnosis not present

## 2022-02-27 ENCOUNTER — Encounter: Payer: Self-pay | Admitting: Family Medicine

## 2022-02-27 ENCOUNTER — Ambulatory Visit (INDEPENDENT_AMBULATORY_CARE_PROVIDER_SITE_OTHER): Payer: Medicare HMO | Admitting: Family Medicine

## 2022-02-27 ENCOUNTER — Other Ambulatory Visit: Payer: Self-pay | Admitting: Family Medicine

## 2022-02-27 ENCOUNTER — Other Ambulatory Visit: Payer: Self-pay

## 2022-02-27 VITALS — BP 149/74 | HR 64 | Temp 99.0°F | Wt 240.0 lb

## 2022-02-27 DIAGNOSIS — N3946 Mixed incontinence: Secondary | ICD-10-CM

## 2022-02-27 DIAGNOSIS — R5382 Chronic fatigue, unspecified: Secondary | ICD-10-CM

## 2022-02-27 DIAGNOSIS — I10 Essential (primary) hypertension: Secondary | ICD-10-CM

## 2022-02-27 DIAGNOSIS — E7800 Pure hypercholesterolemia, unspecified: Secondary | ICD-10-CM

## 2022-02-27 DIAGNOSIS — I4891 Unspecified atrial fibrillation: Secondary | ICD-10-CM

## 2022-02-27 DIAGNOSIS — Z0000 Encounter for general adult medical examination without abnormal findings: Secondary | ICD-10-CM

## 2022-02-27 DIAGNOSIS — E611 Iron deficiency: Secondary | ICD-10-CM

## 2022-02-27 DIAGNOSIS — G8929 Other chronic pain: Secondary | ICD-10-CM

## 2022-02-27 DIAGNOSIS — R739 Hyperglycemia, unspecified: Secondary | ICD-10-CM | POA: Diagnosis not present

## 2022-02-27 DIAGNOSIS — Z7901 Long term (current) use of anticoagulants: Secondary | ICD-10-CM

## 2022-02-27 DIAGNOSIS — M25561 Pain in right knee: Secondary | ICD-10-CM | POA: Diagnosis not present

## 2022-02-27 DIAGNOSIS — M25562 Pain in left knee: Secondary | ICD-10-CM | POA: Insufficient documentation

## 2022-02-27 DIAGNOSIS — F32A Depression, unspecified: Secondary | ICD-10-CM

## 2022-02-27 DIAGNOSIS — Z5320 Procedure and treatment not carried out because of patient's decision for unspecified reasons: Secondary | ICD-10-CM | POA: Insufficient documentation

## 2022-02-27 DIAGNOSIS — G4700 Insomnia, unspecified: Secondary | ICD-10-CM

## 2022-02-27 DIAGNOSIS — L309 Dermatitis, unspecified: Secondary | ICD-10-CM

## 2022-02-27 MED ORDER — TRIAMCINOLONE ACETONIDE 0.5 % EX OINT: 1.0000 "application " | TOPICAL_OINTMENT | Freq: Two times a day (BID) | CUTANEOUS | 0 refills | Status: DC

## 2022-02-27 NOTE — Assessment & Plan Note (Signed)
Encourage referral to urology ?Reduce irritants in diet ?Work to retrain bladder ?

## 2022-02-27 NOTE — Assessment & Plan Note (Signed)
Continue to recommend balanced, lower carb meals. Smaller meal size, adding snacks. Choosing water as drink of choice and increasing purposeful exercise. ?Repeat a1c ? ?

## 2022-02-27 NOTE — Assessment & Plan Note (Signed)
Reports pain in R>L ?Encouraged stretching and walking ?Denies need for referral to PT or ortho ?encouraged weight loss through portion control and adding fruits/veg/whole grains ?

## 2022-02-27 NOTE — Telephone Encounter (Signed)
Requested Prescriptions  ?Pending Prescriptions Disp Refills  ?? hydrochlorothiazide (HYDRODIURIL) 12.5 MG tablet [Pharmacy Med Name: HYDROCHLOROTHIAZIDE 12.5 MG Tablet] 90 tablet 1  ?  Sig: TAKE 1 TABLET EVERY DAY (MUST KEEP MD APPOINTMENT FOR REFILLS)  ?  ? Cardiovascular: Diuretics - Thiazide Failed - 02/27/2022 12:07 PM  ?  ?  Failed - K in normal range and within 180 days  ?  Potassium  ?Date Value Ref Range Status  ?12/09/2021 3.3 (L) 3.5 - 5.1 mmol/L Final  ?12/20/2014 3.5 3.5 - 5.1 mmol/L Final  ?   ?  ?  Failed - Last BP in normal range  ?  BP Readings from Last 1 Encounters:  ?02/27/22 (!) 149/74  ?   ?  ?  Passed - Cr in normal range and within 180 days  ?  Creatinine  ?Date Value Ref Range Status  ?12/20/2014 0.86 0.60 - 1.30 mg/dL Final  ? ?Creatinine, Ser  ?Date Value Ref Range Status  ?12/09/2021 0.68 0.44 - 1.00 mg/dL Final  ?   ?  ?  Passed - Na in normal range and within 180 days  ?  Sodium  ?Date Value Ref Range Status  ?12/09/2021 137 135 - 145 mmol/L Final  ?08/10/2020 140 134 - 144 mmol/L Final  ?12/20/2014 142 136 - 145 mmol/L Final  ?   ?  ?  Passed - Valid encounter within last 6 months  ?  Recent Outpatient Visits   ?      ? 6 months ago Anxiety  ? [facility] [place], Dionne Bucy, [place]  ? 1 year ago Morbid obesity [facility])  ? [facility] [place], Wendee Beavers, [place]  ? 1 year ago Annual physical exam  ? [facility] Carles Collet M, [place]  ? 2 years ago Anxiety  ? [facility] [place], Wendee Beavers, [place]  ? 2 years ago Annual physical exam  ? [facility] Carles Collet M, [place]  ?  ?  ?Future Appointments   ?        ? In 4 weeks [name], [place], [facility]  ?  ? ?  ?  ?  ? ? ?

## 2022-02-27 NOTE — Assessment & Plan Note (Addendum)
Chronic, elevated ?Will reach out to cardiology for advice on changing medications given patient complaints of LE edema with sores and incontinence related to HCTZ use ?Dermatis with sores r/t scratching on BLE- do not want to exacerbate with use of norvasc ?Reports never eating fruits/vegetables, or exercising; not interested in changing now ?Does not check BP at home- has a cuff, but does not know how to use it- encouraged she bring it back it into office ?Continue current medications ?Reviewed recent metabolic panel ?F/u in 1 month ?

## 2022-02-27 NOTE — Assessment & Plan Note (Signed)
Education provided; continues to refuse ?

## 2022-02-27 NOTE — Assessment & Plan Note (Signed)
Chronic, acute exacerbation ?Reviewed skin creams to recommend OTC to assist with care and topical steroid to assist qday ?Encouraged to stop scratching  ?

## 2022-02-27 NOTE — Assessment & Plan Note (Signed)
Chronic, stable ?On AC ?F/b cards ?RC today ?

## 2022-02-27 NOTE — Assessment & Plan Note (Signed)
Multi focal ?Will check vit levels ?Cbc ?a1c ?Denies depression ?Denies CCM need ?Lives at home with dog ?Close relationship with sister ?Denies SI or HI ?

## 2022-02-27 NOTE — Assessment & Plan Note (Signed)
On warfarin for Afib ?

## 2022-02-27 NOTE — Patient Instructions (Addendum)
Recommend mild soap and moisturizing cream 1-2 times daily.   ?  ?Recommend OTC Gold Bond Rapid Relief Anti-Itch cream (pramoxine + menthol), CeraVe Anti-itch cream or lotion (pramoxine), Sarna lotion (Original- menthol + camphor or Sensitive- pramoxine) or Eucerin 12 hour Itch Relief lotion (menthol) up to 3 times per day to areas on body that are itchy. ?  ?Topical steroids (such as triamcinolone, fluocinolone, fluocinonide, mometasone, clobetasol, halobetasol, betamethasone, hydrocortisone) can cause thinning and lightening of the skin if they are used for too long in the same area. Your provider has selected the right strength medicine for your problem and area affected on the body. Please use your medication only as directed by your physician to prevent side effects.  ? ?I have reached out to cardiology today and will let you know what they recommend to assist with your blood pressure control. Continue to recommend DASH diet changes and exercise as tolerated. ?

## 2022-02-27 NOTE — Assessment & Plan Note (Signed)
Previously well controlled; repeat LP ?Continue statin ?The 10-year ASCVD risk score (Arnett DK, et al., 2019) is: 17.3% ?  Values used to calculate the score: ?    Age: 71 years ?    Sex: Female ?    Is Non-Hispanic African American: No ?    Diabetic: No ?    Tobacco smoker: No ?    Systolic Blood Pressure: 564 mmHg ?    Is BP treated: Yes ?    HDL Cholesterol: 51 mg/dL ?    Total Cholesterol: 143 mg/dL ? ?

## 2022-02-27 NOTE — Assessment & Plan Note (Signed)
Education provided; hx of breast cancer ?

## 2022-02-27 NOTE — Progress Notes (Signed)
[place],acting as a scribe for [name], FNP.,have documented all relevant documentation on the behalf of [name], FNP,as directed by  [name], FNP while in the presence of [name], FNP.    Complete Physical     Patient: Erin Romero, Female    DOB: 1951-11-21, 71 y.o.   MRN: [id number] Visit Date: 02/27/2022  Today's Provider: [name], FNP   Introduced to nurse practitioner role and practice setting.  All questions answered.  Discussed provider/patient relationship and expectations.  No chief complaint on file.  Subjective    Erin Romero is a 71 y.o. female who presents today for her Annual Wellness Visit. She reports consuming a general diet.  She generally feels well. She reports sleeping well. She does have additional problems to discuss today.   HPI  Patient complains of itching all over her body and scratching to the point of causing sores.  She states she has had radiation therapy and wonders if it is a result of that therapy.  She states it has been going on for over 1 year.  Medications: Outpatient Medications Prior to Visit  Medication Sig   acetaminophen (TYLENOL) 500 MG tablet Take 1,500 mg by mouth every 4 (four) hours as needed for moderate pain.    amLODipine (NORVASC) 2.5 MG tablet TAKE 1 TABLET EVERY DAY   atorvastatin (LIPITOR) 10 MG tablet TAKE 1 TABLET EVERY DAY   bisoprolol-hydrochlorothiazide (ZIAC) 10-6.25 MG tablet TAKE 1 TABLET EVERY DAY   Calcium Carb-Cholecalciferol (OYSTER SHELL CALCIUM + D PO) Take 1 tablet by mouth daily. '600mg'$  calcium plus 20 mg Vit D   Cholecalciferol (VITAMIN D-3) 25 MCG (1000 UT) CAPS Take 1,000 Units by mouth daily.    escitalopram (LEXAPRO) 10 MG tablet TAKE 1 TABLET EVERY DAY   hydrochlorothiazide (HYDRODIURIL) 12.5 MG tablet Take 1 tablet (12.5 mg total) by mouth daily.   losartan (COZAAR) 100 MG tablet Take by mouth.   omeprazole (PRILOSEC) 20 MG capsule Take 1 capsule  (20 mg total) by mouth daily.   potassium chloride SA (KLOR-CON M) 20 MEQ tablet TAKE 1 TABLET EVERY DAY   traZODone (DESYREL) 50 MG tablet TAKE 1 TABLET EVERY DAY   warfarin (COUMADIN) 1 MG tablet Take 1 tablet by mouth daily.   warfarin (COUMADIN) 2 MG tablet Take 2 mg by mouth as directed. 1.5 mg x 5 days and '2mg'$  x 2 days   No facility-administered medications prior to visit.    Allergies  Allergen Reactions   Taxotere [Docetaxel] Itching and Rash   Sulfa Antibiotics Hives    Patient Care Team: [name], FNP as PCP - General (Family Medicine) Bary Castilla, Forest Gleason, MD as Consulting Physician (General Surgery) Ubaldo Glassing Javier Docker, MD as Consulting Physician (Cardiology) Earlie Server, MD as Consulting Physician (Oncology) Cleaster Corin, OD (Optometry)  Review of Systems  Constitutional:  Positive for fatigue.  HENT:  Positive for tinnitus.   Eyes:  Positive for photophobia and visual disturbance.  Respiratory:  Positive for shortness of breath.   Cardiovascular:  Positive for palpitations and leg swelling.  Gastrointestinal: Negative.   Endocrine: Negative.   Genitourinary: Negative.   Musculoskeletal:  Positive for arthralgias and back pain.  Skin: Negative.   Allergic/Immunologic: Negative.   Neurological:  Positive for dizziness.  Hematological:  Bruises/bleeds easily.  Psychiatric/Behavioral:  The patient is nervous/anxious.         Objective    Vitals: BP (!) 149/74 (  BP Location: Right Arm, Patient Position: Sitting, Cuff Size: Large)    Pulse 64    Temp 99 F (37.2 C) (Oral)    Wt 240 lb (108.9 kg)    SpO2 98%    BMI 41.20 kg/m      Physical Exam Vitals and nursing note reviewed.  Constitutional:      General: She is awake. She is not in acute distress.    Appearance: Normal appearance. She is well-developed and well-groomed. She is obese. She is not ill-appearing, toxic-appearing or diaphoretic.  HENT:     Head: Normocephalic and atraumatic.     Jaw: There is  normal jaw occlusion. No trismus, tenderness, swelling or pain on movement.     Right Ear: Hearing, tympanic membrane, ear canal and external ear normal. There is no impacted cerumen.     Left Ear: Hearing, tympanic membrane, ear canal and external ear normal. There is no impacted cerumen.     Nose: Nose normal. No congestion or rhinorrhea.     Right Turbinates: Not enlarged, swollen or pale.     Left Turbinates: Not enlarged, swollen or pale.     Right Sinus: No maxillary sinus tenderness or frontal sinus tenderness.     Left Sinus: No maxillary sinus tenderness or frontal sinus tenderness.     Mouth/Throat:     Lips: Pink.     Mouth: Mucous membranes are moist. No injury.     Tongue: No lesions.     Pharynx: Oropharynx is clear. Uvula midline. No pharyngeal swelling, oropharyngeal exudate, posterior oropharyngeal erythema or uvula swelling.     Tonsils: No tonsillar exudate or tonsillar abscesses.  Eyes:     General: Lids are normal. Lids are everted, no foreign bodies appreciated. Vision grossly intact. Gaze aligned appropriately. No allergic shiner or visual field deficit.       Right eye: No discharge.        Left eye: No discharge.     Extraocular Movements: Extraocular movements intact.     Conjunctiva/sclera: Conjunctivae normal.     Right eye: Right conjunctiva is not injected. No exudate.    Left eye: Left conjunctiva is not injected. No exudate.    Pupils: Pupils are equal, round, and reactive to light.  Neck:     Thyroid: No thyroid mass, thyromegaly or thyroid tenderness.     Vascular: No carotid bruit.     Trachea: Trachea normal.  Cardiovascular:     Rate and Rhythm: Normal rate and regular rhythm.     Pulses: Normal pulses.          Carotid pulses are 2+ on the right side and 2+ on the left side.      Radial pulses are 2+ on the right side and 2+ on the left side.       Dorsalis pedis pulses are 2+ on the right side and 2+ on the left side.       Posterior tibial  pulses are 2+ on the right side and 2+ on the left side.     Heart sounds: Normal heart sounds, S1 normal and S2 normal. No murmur heard.   No friction rub. No gallop.  Pulmonary:     Effort: Pulmonary effort is normal. No respiratory distress.     Breath sounds: Decreased air movement present. No stridor. Examination of the right-lower field reveals decreased breath sounds. Examination of the left-lower field reveals decreased breath sounds. Decreased breath sounds present. No wheezing, rhonchi or rales.  Chest:     Chest wall: No tenderness.     Comments: Hx of breast removal s/p breat ca; refused exam Abdominal:     General: Abdomen is flat. Bowel sounds are normal. There is no distension.     Palpations: Abdomen is soft. There is no mass.     Tenderness: There is no abdominal tenderness. There is no right CVA tenderness, left CVA tenderness, guarding or rebound.     Hernia: No hernia is present.  Genitourinary:    Comments: Exam deferred; denies complaints beyond incontinence  Musculoskeletal:        General: No swelling, tenderness, deformity or signs of injury. Normal range of motion.     Cervical back: Full passive range of motion without pain, normal range of motion and neck supple. No edema, rigidity or tenderness. No muscular tenderness.     Right lower leg: No edema.     Left lower leg: No edema.  Lymphadenopathy:     Cervical: No cervical adenopathy.     Right cervical: No superficial, deep or posterior cervical adenopathy.    Left cervical: No superficial, deep or posterior cervical adenopathy.  Skin:    General: Skin is warm and dry.     Capillary Refill: Capillary refill takes less than 2 seconds.     Coloration: Skin is not jaundiced or pale.     Findings: Lesion present. No bruising, erythema or rash.  Neurological:     General: No focal deficit present.     Mental Status: She is alert and oriented to person, place, and time. Mental status is at baseline.     GCS:  GCS eye subscore is 4. GCS verbal subscore is 5. GCS motor subscore is 6.     Sensory: Sensation is intact. No sensory deficit.     Motor: Motor function is intact. No weakness.     Coordination: Coordination is intact. Coordination normal.     Gait: Gait is intact. Gait normal.  Psychiatric:        Attention and Perception: Attention and perception normal.        Mood and Affect: Mood and affect normal.        Speech: Speech normal.        Behavior: Behavior normal. Behavior is cooperative.        Thought Content: Thought content normal.        Cognition and Memory: Cognition and memory normal.        Judgment: Judgment normal.     Most recent functional status assessment: In your present state of health, do you have any difficulty performing the following activities: 02/27/2022  Hearing? Y  Vision? Y  Comment -  Difficulty concentrating or making decisions? N  Walking or climbing stairs? Y  Dressing or bathing? N  Doing errands, shopping? N  Preparing Food and eating ? -  Using the Toilet? -  In the past six months, have you accidently leaked urine? -  Do you have problems with loss of bowel control? -  Managing your Medications? -  Managing your Finances? -  Housekeeping or managing your Housekeeping? -  Some recent data might be hidden   Most recent fall risk assessment: Fall Risk  02/27/2022  Falls in the past year? 0  Number falls in past yr: -  Injury with Fall? -  Risk for fall due to : -  Follow up -    Most recent depression screenings: PHQ 2/9 Scores 02/27/2022 08/13/2021  PHQ - 2 Score 2 0  PHQ- 9 Score 4 3   Most recent cognitive screening: 6CIT Screen 09/08/2021  What Year? 0 points  What month? 0 points  What time? 0 points  Count back from 20 0 points  Months in reverse 0 points  Repeat phrase 0 points  Total Score 0   Most recent Audit-C alcohol use screening Alcohol Use Disorder Test (AUDIT) 02/27/2022  1. How often do you have a drink containing  alcohol? 0  2. How many drinks containing alcohol do you have on a typical day when you are drinking? -  3. How often do you have six or more drinks on one occasion? -  AUDIT-C Score -  Alcohol Brief Interventions/Follow-up -   A score of 3 or more in women, and 4 or more in men indicates increased risk for alcohol abuse, EXCEPT if all of the points are from question 1   No results found for any visits on 02/27/22.  Assessment & Plan     Annual wellness visit done today including the all of the following: Reviewed patient's Family Medical History Reviewed and updated list of patient's medical providers Assessment of cognitive impairment was done Assessed patient's functional ability Established a written schedule for health screening [place] Completed and Reviewed  Exercise Activities and Dietary recommendations  Goals      DIET - REDUCE SUGAR INTAKE     Recommend cutting out sugar and ice cream from daily diet and substituting for sugar free snacks.      Exercise 150 minutes per week (moderate activity)     Increase water intake     Recommend increasing water intake to 4 glasses of water a day.         Immunization History  Administered Date(s) Administered   Fluad Quad(high Dose 65+) 09/19/2019   Influenza, High Dose Seasonal PF 09/14/2018   Influenza,inj,Quad PF,6+ Mos 09/06/2015, 09/21/2017   Pneumococcal Conjugate-13 08/22/2015   Pneumococcal Polysaccharide-23 09/03/2016, 09/21/2017   Tdap 08/08/2020    Health Maintenance  Topic Date Due   COVID-19 Vaccine (1) Never done   Zoster Vaccines- Shingrix (1 of 2) Never done   DEXA SCAN  09/08/2020   MAMMOGRAM  10/25/2021   TETANUS/TDAP  08/08/2030   COLONOSCOPY (Pts 45-70yr Insurance coverage will need to be confirmed)  10/31/2030   Pneumonia Vaccine 71 Years old  Completed   Hepatitis C Screening  Completed   HPV VACCINES  Aged Out   INFLUENZA VACCINE  Discontinued     Discussed  health benefits of physical activity, and encouraged her to engage in regular exercise appropriate for her age and condition.    Problem List Items Addressed This Visit       Cardiovascular and Mediastinum   A-fib ([place]    Chronic, stable On AC F/b cards RC today      BP (high blood pressure)    Chronic, elevated Will reach out to cardiology for advice on changing medications given patient complaints of LE edema with sores and incontinence related to HCTZ use Dermatis with sores r/t scratching on BLE- do not want to exacerbate with use of norvasc Reports never eating fruits/vegetables, or exercising; not interested in changing now Does not check BP at home- has a cuff, but does not know how to use it- encouraged she bring it back it into office Continue current medications Reviewed recent metabolic panel F/u in 1 month      Relevant Orders  Comprehensive metabolic panel     Musculoskeletal and Integument   Dermatitis    Chronic, acute exacerbation Reviewed skin creams to recommend OTC to assist with care and topical steroid to assist qday Encouraged to stop scratching       Relevant Medications   triamcinolone ointment (KENALOG) 0.5 %     Other   Annual physical exam - Primary    Has not been to the dentist in >5 years Things to do to keep yourself healthy  - Exercise at least 30-45 minutes a day, 3-4 days a week.  - Eat a low-fat diet with lots of fruits and vegetables, up to 7-9 servings per day.  - Seatbelts can save your life. Wear them always.  - Smoke detectors on every level of your home, check batteries every year.  - Eye Doctor - have an eye exam every 1-2 years  - Safe sex - if you may be exposed to STDs, use a condom.  - Alcohol -  If you drink, do it moderately, less than 2 drinks per day.  - [place]. Choose someone to speak for you if you are not able.  - Depression is common in our stressful world.If you're feeling down or losing  interest in things you normally enjoy, please come in for a visit.  - Violence - If anyone is threatening or hurting you, please call immediately.        Relevant Orders   Comprehensive metabolic panel   CBC with Differential/Platelet   Hemoglobin A1c   TSH + free T4   Lipid panel   Blood glucose elevated    Continue to recommend balanced, lower carb meals. Smaller meal size, adding snacks. Choosing water as drink of choice and increasing purposeful exercise. Repeat a1c       Relevant Orders   Hemoglobin A1c   Chronic anticoagulation    On warfarin for Afib      Chronic fatigue    Multi focal Will check vit levels Cbc a1c Denies depression Denies CCM need Lives at home with dog Close relationship with sister Denies SI or HI      Relevant Orders   Vitamin D (25 hydroxy)   B12 and Folate Panel   Chronic pain of both knees    Reports pain in R>L Encouraged stretching and walking Denies need for referral to PT or ortho encouraged weight loss through portion control and adding fruits/veg/whole grains      Hypercholesteremia    Previously well controlled; repeat LP Continue statin The 10-year ASCVD risk score (Arnett DK, et al., 2019) is: 17.3%   Values used to calculate the score:     Age: 80 years     Sex: Female     Is Non-Hispanic African American: No     Diabetic: No     Tobacco smoker: No     Systolic Blood Pressure: 154 mmHg     Is BP treated: Yes     HDL Cholesterol: 51 mg/dL     Total Cholesterol: 143 mg/dL       Relevant Orders   Lipid panel   Iron deficiency    R/t fatigue s/s Repeat CBC Polyps removed by GI      Relevant Orders   CBC with Differential/Platelet   Iron, TIBC and Ferritin Panel   Mammogram declined    Education provided; hx of breast cancer      Mixed stress and urge urinary incontinence  Encourage referral to urology Reduce irritants in diet Work to retrain bladder      Relevant Orders   Ambulatory referral to  Urology   Morbid obesity ([place])    Body mass index is 41.2 kg/m. Discussed importance of healthy weight management Discussed diet and exercise       Relevant Orders   Hemoglobin A1c   Osteoporosis monitoring declined    Education provided; continues to refuse        Return in about 4 weeks (around 03/27/2022) for HTN management- bring machine.     Vonna Kotyk, FNP, have reviewed all documentation for this visit. The documentation on 02/27/22 for the exam, diagnosis, procedures, and orders are all accurate and complete.    [name], [place] [phone] (phone) [phone] (fax)  [place]

## 2022-02-27 NOTE — Assessment & Plan Note (Signed)
R/t fatigue s/s ?Repeat CBC ?Polyps removed by GI ?

## 2022-02-27 NOTE — Assessment & Plan Note (Signed)
Has not been to the dentist in >5 years ?Things to do to keep yourself healthy  ?- Exercise at least 30-45 minutes a day, 3-4 days a week.  ?- Eat a low-fat diet with lots of fruits and vegetables, up to 7-9 servings per day.  ?- Seatbelts can save your life. Wear them always.  ?- Smoke detectors on every level of your home, check batteries every year.  ?- Eye Doctor - have an eye exam every 1-2 years  ?- Safe sex - if you may be exposed to STDs, use a condom.  ?- Alcohol -  If you drink, do it moderately, less than 2 drinks per day.  ?- [place]. Choose someone to speak for you if you are not able.  ?- Depression is common in our stressful world.If you're feeling down or losing interest in things you normally enjoy, please come in for a visit.  ?- Violence - If anyone is threatening or hurting you, please call immediately. ? ? ?

## 2022-02-27 NOTE — Assessment & Plan Note (Signed)
Body mass index is 41.2 kg/m?. ?Discussed importance of healthy weight management ?Discussed diet and exercise ? ?

## 2022-02-28 ENCOUNTER — Other Ambulatory Visit: Payer: Self-pay | Admitting: Family Medicine

## 2022-02-28 LAB — IRON,TIBC AND FERRITIN PANEL
Ferritin: 17 ng/mL (ref 15–150)
Iron Saturation: 13 % — ABNORMAL LOW (ref 15–55)
Iron: 52 ug/dL (ref 27–139)
Total Iron Binding Capacity: 411 ug/dL (ref 250–450)
UIBC: 359 ug/dL (ref 118–369)

## 2022-02-28 LAB — CBC WITH DIFFERENTIAL/PLATELET
Basophils Absolute: 0.1 10*3/uL (ref 0.0–0.2)
Basos: 1 %
EOS (ABSOLUTE): 0.1 10*3/uL (ref 0.0–0.4)
Eos: 1 %
Hematocrit: 34.7 % (ref 34.0–46.6)
Hemoglobin: 11.4 g/dL (ref 11.1–15.9)
Immature Grans (Abs): 0 10*3/uL (ref 0.0–0.1)
Immature Granulocytes: 0 %
Lymphocytes Absolute: 1.6 10*3/uL (ref 0.7–3.1)
Lymphs: 16 %
MCH: 26.6 pg (ref 26.6–33.0)
MCHC: 32.9 g/dL (ref 31.5–35.7)
MCV: 81 fL (ref 79–97)
Monocytes Absolute: 0.9 10*3/uL (ref 0.1–0.9)
Monocytes: 9 %
Neutrophils Absolute: 7.1 10*3/uL — ABNORMAL HIGH (ref 1.4–7.0)
Neutrophils: 73 %
Platelets: 257 10*3/uL (ref 150–450)
RBC: 4.29 x10E6/uL (ref 3.77–5.28)
RDW: 15.9 % — ABNORMAL HIGH (ref 11.7–15.4)
WBC: 9.8 10*3/uL (ref 3.4–10.8)

## 2022-02-28 LAB — COMPREHENSIVE METABOLIC PANEL
ALT: 12 IU/L (ref 0–32)
AST: 20 IU/L (ref 0–40)
Albumin/Globulin Ratio: 1.3 (ref 1.2–2.2)
Albumin: 4 g/dL (ref 3.7–4.7)
Alkaline Phosphatase: 80 IU/L (ref 44–121)
BUN/Creatinine Ratio: 17 (ref 12–28)
BUN: 13 mg/dL (ref 8–27)
Bilirubin Total: 0.5 mg/dL (ref 0.0–1.2)
CO2: 28 mmol/L (ref 20–29)
Calcium: 8.9 mg/dL (ref 8.7–10.3)
Chloride: 100 mmol/L (ref 96–106)
Creatinine, Ser: 0.76 mg/dL (ref 0.57–1.00)
Globulin, Total: 3 g/dL (ref 1.5–4.5)
Glucose: 128 mg/dL — ABNORMAL HIGH (ref 70–99)
Potassium: 3.5 mmol/L (ref 3.5–5.2)
Sodium: 141 mmol/L (ref 134–144)
Total Protein: 7 g/dL (ref 6.0–8.5)
eGFR: 84 mL/min/{1.73_m2} (ref >59)

## 2022-02-28 LAB — LIPID PANEL
Chol/HDL Ratio: 2.5 ratio (ref 0.0–4.4)
Cholesterol, Total: 126 mg/dL (ref 100–199)
HDL: 50 mg/dL (ref >39)
LDL Chol Calc (NIH): 50 mg/dL (ref 0–99)
Triglycerides: 152 mg/dL — ABNORMAL HIGH (ref 0–149)
VLDL Cholesterol Cal: 26 mg/dL (ref 5–40)

## 2022-02-28 LAB — B12 AND FOLATE PANEL
Folate: 8.2 ng/mL (ref >3.0)
Vitamin B-12: 336 pg/mL (ref 232–1245)

## 2022-02-28 LAB — TSH+FREE T4
Free T4: 1.16 ng/dL (ref 0.82–1.77)
TSH: 1.8 u[IU]/mL (ref 0.450–4.500)

## 2022-02-28 LAB — HEMOGLOBIN A1C
Est. average glucose Bld gHb Est-mCnc: 114 mg/dL
Hgb A1c MFr Bld: 5.6 % (ref 4.8–5.6)

## 2022-02-28 LAB — VITAMIN D 25 HYDROXY (VIT D DEFICIENCY, FRACTURES): Vit D, 25-Hydroxy: 51.2 ng/mL (ref 30.0–100.0)

## 2022-03-19 DIAGNOSIS — I480 Paroxysmal atrial fibrillation: Secondary | ICD-10-CM | POA: Diagnosis not present

## 2022-03-26 NOTE — Progress Notes (Signed)
?  ?[employer] as a Education administrator for [name], FNP.,have documented all relevant documentation on the behalf of [name], FNP,as directed by  [name], FNP while in the presence of [name], FNP.  ? ?Established patient visit ? ? ?Patient: Erin Romero   DOB: 1951/03/20   71 y.o. Female  MRN: [id number] ?Visit Date: 03/27/2022 ? ?Today's healthcare provider: [name], FNP  ? ?Re Introduced to nurse practitioner role and practice setting.  All questions answered.  Discussed provider/patient relationship and expectations. ? ? ?Chief Complaint  ?Patient presents with  ? Hypertension  ? ?Subjective  ?  ?HPI  ?Hypertension, follow-up ? ?BP Readings from Last 3 Encounters:  ?03/27/22 (!) 176/91  ?02/27/22 (!) 149/74  ?12/11/21 (!) 158/70  ? Wt Readings from Last 3 Encounters:  ?03/27/22 235 lb 4.8 oz (106.7 kg)  ?02/27/22 240 lb (108.9 kg)  ?12/11/21 240 lb 11.2 oz (109.2 kg)  ?  ? ?She was last seen for hypertension 1 months ago.  ?BP at that visit was 149/74. Management since that visit includes none. ? ?She reports excellent compliance with treatment. ?She is not having side effects.  ?She is following a Regular diet. ?She is not exercising. ?She does not smoke. ? ?Use of agents associated with hypertension: none.  ? ?Outside blood pressures are not checked. ?Symptoms: ?No chest pain No chest pressure  ?No palpitations No syncope  ?Yes dyspnea No orthopnea  ?No paroxysmal nocturnal dyspnea Yes lower extremity edema  ? ?Pertinent labs ?Lab Results  ?Component Value Date  ? CHOL 126 02/27/2022  ? HDL 50 02/27/2022  ? [place] 50 02/27/2022  ? TRIG 152 (H) 02/27/2022  ? CHOLHDL 2.5 02/27/2022  ? Lab Results  ?Component Value Date  ? NA 141 02/27/2022  ? K 3.5 02/27/2022  ? CREATININE 0.76 02/27/2022  ? EGFR 84 02/27/2022  ? GLUCOSE 128 (H) 02/27/2022  ? TSH 1.800 02/27/2022  ?  ? ?The ASCVD Risk score (Arnett DK, et al., 2019) failed to calculate for the following reasons: ?  The  valid total cholesterol range is 130 to 320 mg/dL ? ?---------------------------------------------------------------------------------------------------  ? ?Medications: ?Outpatient Medications Prior to Visit  ?Medication Sig  ? acetaminophen (TYLENOL) 500 MG tablet Take 1,500 mg by mouth every 4 (four) hours as needed for moderate pain.   ? amLODipine (NORVASC) 2.5 MG tablet TAKE 1 TABLET EVERY DAY  ? atorvastatin (LIPITOR) 10 MG tablet TAKE 1 TABLET EVERY DAY  ? Calcium Carb-Cholecalciferol (OYSTER SHELL CALCIUM + D PO) Take 1 tablet by mouth daily. 660m calcium plus 20 mg Vit D  ? Cholecalciferol (VITAMIN D-3) 25 MCG (1000 UT) CAPS Take 1,000 Units by mouth daily.   ? escitalopram (LEXAPRO) 10 MG tablet TAKE 1 TABLET EVERY DAY  ? losartan (COZAAR) 100 MG tablet Take by mouth.  ? omeprazole (PRILOSEC) 20 MG capsule Take 1 capsule (20 mg total) by mouth daily.  ? potassium chloride SA (KLOR-CON M) 20 MEQ tablet TAKE 1 TABLET EVERY DAY  ? traZODone (DESYREL) 50 MG tablet TAKE 1 TABLET EVERY DAY  ? warfarin (COUMADIN) 1 MG tablet Take 1 tablet by mouth daily.  ? warfarin (COUMADIN) 2 MG tablet Take 2 mg by mouth as directed. 1.5 mg x 5 days and 231mx 2 days  ? [DISCONTINUED] bisoprolol-hydrochlorothiazide (ZIAC) 10-6.25 MG tablet TAKE 1 TABLET EVERY DAY  ? [DISCONTINUED] hydrochlorothiazide (HYDRODIURIL) 12.5 MG tablet TAKE 1 TABLET EVERY DAY (MUST KEEP MD  APPOINTMENT FOR REFILLS)  ? [DISCONTINUED] triamcinolone ointment (KENALOG) 0.5 % Apply 1 application. topically 2 (two) times daily. Bilateral lower legs, apply once daily at bedtime  ? ?No facility-administered medications prior to visit.  ? ? ?Review of Systems ? ? ?  Objective  ?  ?BP (!) 176/91 (BP Location: Left Wrist, Patient Position: Sitting, Cuff Size: Normal)   Pulse 71   Temp (!) 97.4 ?F (36.3 ?C) (Temporal)   Resp 16   Wt 235 lb 4.8 oz (106.7 kg)   SpO2 97%   BMI 40.39 kg/m?  ? ? ?Physical Exam ?Vitals and nursing note reviewed.   ?Constitutional:   ?   General: She is not in acute distress. ?   Appearance: Normal appearance. She is obese. She is not ill-appearing, toxic-appearing or diaphoretic.  ?HENT:  ?   Head: Normocephalic and atraumatic.  ?Cardiovascular:  ?   Rate and Rhythm: Normal rate. Rhythm irregular.  ?   Pulses: Normal pulses.  ?   Heart sounds: Normal heart sounds. No murmur heard. ?  No friction rub. No gallop.  ?Pulmonary:  ?   Effort: Pulmonary effort is normal. No respiratory distress.  ?   Breath sounds: Normal breath sounds. No stridor. No wheezing, rhonchi or rales.  ?Chest:  ?   Chest wall: No tenderness.  ?Abdominal:  ?   General: Bowel sounds are normal.  ?   Palpations: Abdomen is soft.  ?Musculoskeletal:     ?   General: No swelling, tenderness, deformity or signs of injury. Normal range of motion.  ?   Right lower leg: No edema.  ?   Left lower leg: No edema.  ?Skin: ?   General: Skin is warm and dry.  ?   Capillary Refill: Capillary refill takes less than 2 seconds.  ?   Coloration: Skin is not jaundiced or pale.  ?   Findings: Lesion present. No bruising, erythema or rash.  ? ?    ?Neurological:  ?   General: No focal deficit present.  ?   Mental Status: She is alert and oriented to person, place, and time. Mental status is at baseline.  ?   Cranial Nerves: No cranial nerve deficit.  ?   Sensory: No sensory deficit.  ?   Motor: No weakness.  ?   Coordination: Coordination normal.  ?Psychiatric:     ?   Mood and Affect: Mood normal.     ?   Behavior: Behavior normal.     ?   Thought Content: Thought content normal.     ?   Judgment: Judgment normal.  ? ? ?No results found for any visits on 03/27/22. ? Assessment & Plan  ?  ? ?Problem List Items Addressed This Visit   ? ?  ? Cardiovascular and Mediastinum  ? A-fib [facility]) - Primary  ?  Chronic, remains irregular ?Change BB today to metop 217m ?On AC warfarin ?On Statin ?  ?  ? Relevant Medications  ? metoprolol succinate (TOPROL-XL) 100 MG 24 hr tablet  ?  hydrochlorothiazide (HYDRODIURIL) 25 MG tablet  ? BP (high blood pressure)  ?  Chronic, elevated; bp consistent on home cuff ?Denies CP ?Denies SOB/ DOE ?Denies low blood pressure/hypotension ?Denies vision changes ?Trace LE Edema noted on exam ?Increase HCTZ to 25 mg, Change Ziac to Toprol 200 mg ?F/u with cards in 3 wks; advise additional f/u based on BP at that time ?Seek emergent care if you develop chest pain or chest  pressure ? ?  ?  ? Relevant Medications  ? metoprolol succinate (TOPROL-XL) 100 MG 24 hr tablet  ? hydrochlorothiazide (HYDRODIURIL) 25 MG tablet  ?  ? Musculoskeletal and Integument  ? Dermatitis  ?  Chronic, improving ?Repeat use of topical steroid ?Pt declines use of atarax to assist ?  ?  ? Relevant Medications  ? triamcinolone ointment (KENALOG) 0.5 %  ?  ? Other  ? Bilateral leg edema  ?  Increase HCTZ to 25 mg; removing Ziac from equation ?Trace today, focus on sodium in diet and address further with cards at appt later in month ?DASH diet discussed ?  ?  ? Relevant Medications  ? hydrochlorothiazide (HYDRODIURIL) 25 MG tablet  ? ? ? ?Return in about 5 months (around 08/27/2022) for chonic disease management.  ?   ? ?I, [name], FNP, have reviewed all documentation for this visit. The documentation on 03/27/22 for the exam, diagnosis, procedures, and orders are all accurate and complete. ? ?[name], FNP  ?[facility] ?[phone] (phone) ?[phone] (fax) ? ?[facility] ?

## 2022-03-27 ENCOUNTER — Encounter: Payer: Self-pay | Admitting: Family Medicine

## 2022-03-27 ENCOUNTER — Ambulatory Visit (INDEPENDENT_AMBULATORY_CARE_PROVIDER_SITE_OTHER): Payer: Medicare HMO | Admitting: Family Medicine

## 2022-03-27 VITALS — BP 176/91 | HR 71 | Temp 97.4°F | Resp 16 | Wt 235.3 lb

## 2022-03-27 DIAGNOSIS — R600 Localized edema: Secondary | ICD-10-CM | POA: Insufficient documentation

## 2022-03-27 DIAGNOSIS — L309 Dermatitis, unspecified: Secondary | ICD-10-CM

## 2022-03-27 DIAGNOSIS — I4811 Longstanding persistent atrial fibrillation: Secondary | ICD-10-CM

## 2022-03-27 DIAGNOSIS — I10 Essential (primary) hypertension: Secondary | ICD-10-CM

## 2022-03-27 MED ORDER — HYDROCHLOROTHIAZIDE 25 MG PO TABS
25.0000 mg | ORAL_TABLET | Freq: Every day | ORAL | 1 refills | Status: DC
Start: 2022-03-27 — End: 2022-08-20

## 2022-03-27 MED ORDER — TRIAMCINOLONE ACETONIDE 0.5 % EX OINT: 1.0000 "application " | TOPICAL_OINTMENT | Freq: Two times a day (BID) | CUTANEOUS | 3 refills | Status: DC

## 2022-03-27 MED ORDER — METOPROLOL SUCCINATE ER 100 MG PO TB24: 200.0000 mg | ORAL_TABLET | Freq: Every day | ORAL | 1 refills | Status: DC

## 2022-03-27 NOTE — Assessment & Plan Note (Signed)
Increase HCTZ to 25 mg; removing Ziac from equation ?Trace today, focus on sodium in diet and address further with cards at appt later in month ?DASH diet discussed ?

## 2022-03-27 NOTE — Assessment & Plan Note (Signed)
Chronic, remains irregular ?Change BB today to metop '200mg'$  ?On AC warfarin ?On Statin ?

## 2022-03-27 NOTE — Assessment & Plan Note (Signed)
Chronic, improving ?Repeat use of topical steroid ?Pt declines use of atarax to assist ?

## 2022-03-27 NOTE — Assessment & Plan Note (Signed)
Chronic, elevated; bp consistent on home cuff ?Denies CP ?Denies SOB/ DOE ?Denies low blood pressure/hypotension ?Denies vision changes ?Trace LE Edema noted on exam ?Increase HCTZ to 25 mg, Change Ziac to Toprol 200 mg ?F/u with cards in 3 wks; advise additional f/u based on BP at that time ?Seek emergent care if you develop chest pain or chest pressure ? ?

## 2022-04-03 ENCOUNTER — Other Ambulatory Visit: Payer: Self-pay | Admitting: Family Medicine

## 2022-04-10 DIAGNOSIS — E7800 Pure hypercholesterolemia, unspecified: Secondary | ICD-10-CM | POA: Diagnosis not present

## 2022-04-10 DIAGNOSIS — I10 Essential (primary) hypertension: Secondary | ICD-10-CM | POA: Diagnosis not present

## 2022-04-10 DIAGNOSIS — I480 Paroxysmal atrial fibrillation: Secondary | ICD-10-CM | POA: Diagnosis not present

## 2022-04-10 DIAGNOSIS — I272 Pulmonary hypertension, unspecified: Secondary | ICD-10-CM | POA: Diagnosis not present

## 2022-04-17 DIAGNOSIS — C50112 Malignant neoplasm of central portion of left female breast: Secondary | ICD-10-CM | POA: Diagnosis not present

## 2022-04-17 DIAGNOSIS — Z9013 Acquired absence of bilateral breasts and nipples: Secondary | ICD-10-CM | POA: Diagnosis not present

## 2022-04-17 DIAGNOSIS — C50111 Malignant neoplasm of central portion of right female breast: Secondary | ICD-10-CM | POA: Diagnosis not present

## 2022-04-21 DIAGNOSIS — Z7901 Long term (current) use of anticoagulants: Secondary | ICD-10-CM | POA: Diagnosis not present

## 2022-05-26 DIAGNOSIS — Z7901 Long term (current) use of anticoagulants: Secondary | ICD-10-CM | POA: Diagnosis not present

## 2022-06-09 ENCOUNTER — Other Ambulatory Visit: Payer: Self-pay

## 2022-06-09 ENCOUNTER — Inpatient Hospital Stay: Payer: Medicare HMO | Attending: Oncology

## 2022-06-09 DIAGNOSIS — Z170 Estrogen receptor positive status [ER+]: Secondary | ICD-10-CM | POA: Insufficient documentation

## 2022-06-09 DIAGNOSIS — Z9013 Acquired absence of bilateral breasts and nipples: Secondary | ICD-10-CM | POA: Diagnosis not present

## 2022-06-09 DIAGNOSIS — D0512 Intraductal carcinoma in situ of left breast: Secondary | ICD-10-CM | POA: Diagnosis not present

## 2022-06-09 DIAGNOSIS — Z79899 Other long term (current) drug therapy: Secondary | ICD-10-CM | POA: Diagnosis not present

## 2022-06-09 DIAGNOSIS — Z853 Personal history of malignant neoplasm of breast: Secondary | ICD-10-CM | POA: Insufficient documentation

## 2022-06-09 DIAGNOSIS — Z9223 Personal history of estrogen therapy: Secondary | ICD-10-CM | POA: Diagnosis not present

## 2022-06-09 DIAGNOSIS — M810 Age-related osteoporosis without current pathological fracture: Secondary | ICD-10-CM | POA: Diagnosis not present

## 2022-06-09 LAB — COMPREHENSIVE METABOLIC PANEL
ALT: 12 U/L (ref 0–44)
AST: 18 U/L (ref 15–41)
Albumin: 3.6 g/dL (ref 3.5–5.0)
Alkaline Phosphatase: 69 U/L (ref 38–126)
Anion gap: 10 (ref 5–15)
BUN: 16 mg/dL (ref 8–23)
CO2: 30 mmol/L (ref 22–32)
Calcium: 8.9 mg/dL (ref 8.9–10.3)
Chloride: 100 mmol/L (ref 98–111)
Creatinine, Ser: 0.89 mg/dL (ref 0.44–1.00)
GFR, Estimated: >60 mL/min (ref >60)
Glucose, Bld: 128 mg/dL — ABNORMAL HIGH (ref 70–99)
Potassium: 3.7 mmol/L (ref 3.5–5.1)
Sodium: 140 mmol/L (ref 135–145)
Total Bilirubin: 0.9 mg/dL (ref 0.3–1.2)
Total Protein: 7.9 g/dL (ref 6.5–8.1)

## 2022-06-09 LAB — FERRITIN: Ferritin: 49 ng/mL (ref 11–307)

## 2022-06-09 LAB — CBC WITH DIFFERENTIAL/PLATELET
Abs Immature Granulocytes: 0.05 10*3/uL (ref 0.00–0.07)
Basophils Absolute: 0.1 10*3/uL (ref 0.0–0.1)
Basophils Relative: 1 %
Eosinophils Absolute: 0.2 10*3/uL (ref 0.0–0.5)
Eosinophils Relative: 2 %
HCT: 40.4 % (ref 36.0–46.0)
Hemoglobin: 13.8 g/dL (ref 12.0–15.0)
Immature Granulocytes: 0 %
Lymphocytes Relative: 21 %
Lymphs Abs: 2.3 10*3/uL (ref 0.7–4.0)
MCH: 30 pg (ref 26.0–34.0)
MCHC: 34.2 g/dL (ref 30.0–36.0)
MCV: 87.8 fL (ref 80.0–100.0)
Monocytes Absolute: 1 10*3/uL (ref 0.1–1.0)
Monocytes Relative: 9 %
Neutro Abs: 7.8 10*3/uL — ABNORMAL HIGH (ref 1.7–7.7)
Neutrophils Relative %: 67 %
Platelets: 231 10*3/uL (ref 150–400)
RBC: 4.6 MIL/uL (ref 3.87–5.11)
RDW: 15.1 % (ref 11.5–15.5)
WBC: 11.4 10*3/uL — ABNORMAL HIGH (ref 4.0–10.5)
nRBC: 0 % (ref 0.0–0.2)

## 2022-06-09 LAB — IRON AND TIBC
Iron: 108 ug/dL (ref 28–170)
Saturation Ratios: 30 % (ref 10.4–31.8)
TIBC: 358 ug/dL (ref 250–450)
UIBC: 250 ug/dL

## 2022-06-10 LAB — CANCER ANTIGEN 27.29: CA 27.29: 27.9 U/mL (ref 0.0–38.6)

## 2022-06-11 ENCOUNTER — Encounter: Payer: Self-pay | Admitting: Oncology

## 2022-06-11 ENCOUNTER — Inpatient Hospital Stay (HOSPITAL_BASED_OUTPATIENT_CLINIC_OR_DEPARTMENT_OTHER): Payer: Medicare HMO | Admitting: Oncology

## 2022-06-11 VITALS — BP 137/78 | HR 71 | Temp 98.3°F | Resp 18 | Wt 234.8 lb

## 2022-06-11 DIAGNOSIS — Z862 Personal history of diseases of the blood and blood-forming organs and certain disorders involving the immune mechanism: Secondary | ICD-10-CM

## 2022-06-11 DIAGNOSIS — Z9223 Personal history of estrogen therapy: Secondary | ICD-10-CM | POA: Diagnosis not present

## 2022-06-11 DIAGNOSIS — C50111 Malignant neoplasm of central portion of right female breast: Secondary | ICD-10-CM

## 2022-06-11 DIAGNOSIS — Z170 Estrogen receptor positive status [ER+]: Secondary | ICD-10-CM | POA: Diagnosis not present

## 2022-06-11 DIAGNOSIS — D0512 Intraductal carcinoma in situ of left breast: Secondary | ICD-10-CM | POA: Diagnosis not present

## 2022-06-11 DIAGNOSIS — R600 Localized edema: Secondary | ICD-10-CM | POA: Diagnosis not present

## 2022-06-11 DIAGNOSIS — Z9013 Acquired absence of bilateral breasts and nipples: Secondary | ICD-10-CM | POA: Diagnosis not present

## 2022-06-11 DIAGNOSIS — Z79899 Other long term (current) drug therapy: Secondary | ICD-10-CM | POA: Diagnosis not present

## 2022-06-11 DIAGNOSIS — Z853 Personal history of malignant neoplasm of breast: Secondary | ICD-10-CM | POA: Diagnosis not present

## 2022-06-11 DIAGNOSIS — M810 Age-related osteoporosis without current pathological fracture: Secondary | ICD-10-CM | POA: Diagnosis not present

## 2022-06-11 NOTE — Progress Notes (Signed)
Pt here for follow up. No new concerns voiced.   

## 2022-06-11 NOTE — Addendum Note (Signed)
Addended by: Earlie Server on: 06/11/2022 11:49 PM   Modules accepted: Orders

## 2022-06-11 NOTE — Progress Notes (Signed)
[facility] day:  06/11/22   Chief Complaint: Erin Romero is a 71 y.o. female with stage IIB right breast cancer who is here for follow up.   PERTINENT ONCOLOGY HISTORY Patient previously followed up with Dr. Mike Gip.  Switch care to me on 08/16/2019. Extensive medical records were reviewed. history of stage IIB (T2N1M0) right breast cancer status post radical mastectomy on 2/3/2 medical 014.  Pathology revealed a 3 cm grade 2 invasive mammary carcinoma. 0/2 sentinel lymph nodes were positive.  In non-sentinel lymph nodes were positive for 0.5 cm macro metastasis with extracapsular extension.  DCIS was present.  ER >90 percent, PR>90%, HER-2/neu negative. Patient underwent 3 cycles of adjuvant chemotherapy with Taxotere and Cytoxan (03/08/2013 - 04/20/2013).  Treatment was complicated with erythromelalgia and an urticarial reaction.  And she did not receive any additional chemotherapy. Adjuvant radiation finished in July 2014.  Patient was started on letrozole in August 2014 and she self discontinued in October 2019. BCI showed low likelihood of benefit from extended endocrine therapy.  13.6% risk of late recurrence years 5-10. Patient has been off Letrozole since 09/2018.   Mammogram on 02/06/2014 was suspicious for left breast mass.  Left breast biopsy on 02/27/2014 was negative for malignancy.  Left-sided mammogram on 10/05/2017 was negative.   Left breast screening mammogram on 10/11/2018 revealed no evidence of malignancy.  #  Osteoporosis,  Bone density study on 06/05/2015 revealed osteopenia with a T score of -1.9 in the left femoral neck.  Bone density on 09/08/2018 revealed osteoporosis with a T-score of -2.5 in the left femoral neck and -2.2 in AP spine L2-L4.  Patient is on calcium and vitamin D.  She was on an oral bisphosphonate x 1 month.  Patient previosly declined Prolia. with T score of -2.5 of left femoral neck and -2.2 AP  spine L2-L4.  Previously declined Prolia.  She reports that she had tried some bone strengthen medication for 1 month last year, and did not tolerate.  She cannot remember the name of the medication.  # 01/30/19 left DCIS ER +, s/p mastectomy Patient was resumed on letrozole in December 2020.  After discussion with Dr. Bary Castilla, decision was made to stop letrozole given that patient has now had bilateral mastectomy and benefit of prophylaxis from AI is very small   #Osteoporosis, continue calcium and vitamin D supplementation. Previously discussed about bisphosphonate or Prolia treatment.  Patient declined.   #Family history of cancer, personal history of invasive breast cancer.  No newly diagnosed DCIS, I discussed with patient about genetic testing.  Patient declines.  INTERVAL HISTORY Erin Romero is a 71 y.o. female who has above history reviewed by me today presents for follow up visit for management of breast cancer Patient reports no new concerns today. No concerns at previous mastectomy sites.  Past Medical History:  Diagnosis Date   Anxiety    Atrial fibrillation ([place]) 2014   Breast cancer [facility]) 2014   right breast   Dysrhythmia    Fibrocystic breast disease 2005   GERD (gastroesophageal reflux disease)    Hypertension 2005   Lump or mass in breast 2014   right mastectomy   Malignant neoplasm of central portion of female breast ([place]) 01/24/2013   Stage II,T2, N1a. ER/PR 90%, HER-2/neu: Not overexpressed. Mastectomy with sentinel node biopsy. 2 sentinel nodes negative, non-sentinel node w/ 5 mm macrometastatic foci in non-sentinel node.  Received post mastectomy radiation.  Past Surgical History:  Procedure Laterality Date   ABDOMINAL HYSTERECTOMY  1999   BREAST BIOPSY Left 2015   BENIGN FIBROTIC BREAST TISSUE WITH CLUSTERED APOCRINE   BREAST BIOPSY Left 11/02/2019   affirm bx, x marker, path pending   BREAST SURGERY Right 2005,2014   biopsy   BREAST SURGERY  Right 2014   mastectomy   COLONOSCOPY  2002   COLONOSCOPY WITH PROPOFOL N/A 10/16/2015   Procedure: COLONOSCOPY WITH PROPOFOL;  Surgeon: Robert Bellow, MD;  Location: [facility] ENDOSCOPY;  Service: Endoscopy;  Laterality: N/A;   COLONOSCOPY WITH PROPOFOL N/A 10/31/2020   Procedure: COLONOSCOPY WITH PROPOFOL;  Surgeon: [name], MD;  Location: [facility] ENDOSCOPY;  Service: Endoscopy;  Laterality: N/A;   ESOPHAGOGASTRODUODENOSCOPY (EGD) WITH PROPOFOL N/A 10/31/2020   Procedure: ESOPHAGOGASTRODUODENOSCOPY (EGD) WITH PROPOFOL;  Surgeon: [name], MD;  Location: [facility] ENDOSCOPY;  Service: Endoscopy;  Laterality: N/A;   GIVENS CAPSULE STUDY N/A 12/27/2020   Procedure: GIVENS CAPSULE STUDY;  Surgeon: [name], MD;  Location: [facility] ENDOSCOPY;  Service: Endoscopy;  Laterality: N/A;   KNEE SURGERY Left 2005   MASTECTOMY Right 2014   radiation chemo   SIMPLE MASTECTOMY WITH AXILLARY SENTINEL NODE BIOPSY Left 11/30/2019   Procedure: SIMPLE MASTECTOMY WITH AXILLARY SENTINEL NODE BIOPSY;  Surgeon: Robert Bellow, MD;  Location: [facility] ORS;  Service: General;  Laterality: Left;   thumb surg Right 2004   TONSILLECTOMY     age of 35    Family History  Problem Relation Age of Onset   Heart disease Father    Hypertension Sister    Cancer Mother        cervical   Cancer Other        ovarian,liver,colon cancer-no family member listed   Osteoporosis Paternal Aunt    Osteoporosis Paternal Grandmother     Social History:  reports that she has never smoked. She has never used smokeless tobacco. She reports that she does not drink alcohol and does not use drugs.    Allergies:  Allergies  Allergen Reactions   Taxotere [Docetaxel] Itching and Rash   Sulfa Antibiotics Hives    Current Medications: Current Outpatient Medications  Medication Sig Dispense Refill   acetaminophen (TYLENOL) 500 MG tablet Take 1,500 mg by mouth every 4 (four) hours as needed for moderate pain.       amLODipine (NORVASC) 2.5 MG tablet TAKE 1 TABLET EVERY DAY 90 tablet 1   atorvastatin (LIPITOR) 10 MG tablet TAKE 1 TABLET EVERY DAY 90 tablet 1   Calcium Carb-Cholecalciferol (OYSTER SHELL CALCIUM + D PO) Take 1 tablet by mouth daily. 662m calcium plus 20 mg Vit D     Cholecalciferol (VITAMIN D-3) 25 MCG (1000 UT) CAPS Take 1,000 Units by mouth daily.      escitalopram (LEXAPRO) 10 MG tablet TAKE 1 TABLET EVERY DAY 90 tablet 1   hydrochlorothiazide (HYDRODIURIL) 25 MG tablet Take 1 tablet (25 mg total) by mouth daily. 90 tablet 1   losartan (COZAAR) 100 MG tablet Take by mouth.     metoprolol succinate (TOPROL-XL) 100 MG 24 hr tablet Take 2 tablets (200 mg total) by mouth daily. 180 tablet 1   omeprazole (PRILOSEC) 20 MG capsule Take 1 capsule (20 mg total) by mouth daily. 90 capsule 3   potassium chloride SA (KLOR-CON M) 20 MEQ tablet TAKE 1 TABLET EVERY DAY 90 tablet 1   traZODone (DESYREL) 50 MG tablet TAKE 1 TABLET EVERY DAY 90 tablet 1  triamcinolone ointment (KENALOG) 0.5 % Apply 1 application. topically 2 (two) times daily. Bilateral lower legs 90 g 3   warfarin (COUMADIN) 1 MG tablet Take 1 tablet by mouth daily.     warfarin (COUMADIN) 2 MG tablet Take 2 mg by mouth as directed. 1.5 mg x 5 days and 31m x 2 days     No current facility-administered medications for this visit.   Review of Systems  Constitutional:  Positive for fatigue. Negative for appetite change, chills and fever.  HENT:   Negative for hearing loss and voice change.   Eyes:  Negative for eye problems.  Respiratory:  Negative for chest tightness and cough.   Cardiovascular:  Negative for chest pain.  Gastrointestinal:  Negative for abdominal distention, abdominal pain and blood in stool.  Endocrine: Negative for hot flashes.  Genitourinary:  Negative for difficulty urinating and frequency.   Musculoskeletal:  Positive for arthralgias.  Skin:  Negative for itching and rash.  Neurological:  Negative for  extremity weakness.  Hematological:  Negative for adenopathy.  Psychiatric/Behavioral:  Negative for confusion.   Breast exam is performed in seated and lying down position. Patient is status post bilateral mastectomy.  No palpable chest wall recurrence. No palpable bilateral axillary adenopathy    Physical Exam: Blood pressure 137/78, pulse 71, temperature 98.3 F (36.8 C), resp. rate 18, weight 234 lb 12.8 oz (106.5 kg). Physical Exam Constitutional:      General: She is not in acute distress.    Appearance: She is not diaphoretic.     Comments: Morbid obese  HENT:     Head: Normocephalic and atraumatic.     Mouth/Throat:     Pharynx: No oropharyngeal exudate.  Eyes:     General: No scleral icterus.    Pupils: Pupils are equal, round, and reactive to light.  Cardiovascular:     Rate and Rhythm: Normal rate and regular rhythm.     Heart sounds: No murmur heard. Pulmonary:     Effort: Pulmonary effort is normal. No respiratory distress.  Abdominal:     General: There is no distension.     Palpations: Abdomen is soft.     Tenderness: There is no abdominal tenderness.  Musculoskeletal:        General: Normal range of motion.     Cervical back: Normal range of motion and neck supple.     Comments: Bilateral lower extremity 1+ edema, dermatitis  Skin:    General: Skin is warm and dry.     Findings: No erythema.  Neurological:     Mental Status: She is alert and oriented to person, place, and time.     Cranial Nerves: No cranial nerve deficit.  Psychiatric:        Mood and Affect: Affect normal.   No palpable chest wall mass at bilateral mastectomy sites.   RADIOGRAPHIC STUDIES: I have personally reviewed the radiological images as listed and agreed with the findings in the report. No results found.   Assessment:  CAnnalina Needlesis a 71y.o. female follows up for breast cancer. 1. Malignant neoplasm of central portion of right breast in [place], estrogen receptor  positive [place]   2. Ductal carcinoma in situ (DCIS) of left breast    #01/24/2013 stage IIB (T2N1M0) right breast cancer s/p right mastectomy  ER/ PR+ and HER-2- finished letrozole x 5 years, stopped in 09/2018- BCI showed low benefit of extended therapy #11/2019  left DCIS, high-grade s/p left mastectomy Off letrozole  due to lack of benefit from aromatase inhibitor with bilateral mastectomy.  Labs are reviewed and discussed with patient. Continue clinical surveillance.   #Osteoporosis,continue calcium and vitamin D supplementation.   She is overdue for bone density and I recommend patient to discussed with primary care provider. She previously declines bisphosphonate treatments.  #Iron deficiency anemia, resolved.   #Bilateral lower extremity edema with skin dermatitis likely due to vein insufficiency. This may be due to her morbid obesity. Recommend life style modification and further discuss with pcp.  Patient declines vascular surgery referral.   We spent sufficient time to discuss many aspect of care, questions were answered to patient's satisfaction.   She will follow-up  12 months Earlie Server, MD  06/11/2022 , 11:42 PM

## 2022-06-16 ENCOUNTER — Other Ambulatory Visit: Payer: Self-pay | Admitting: Family Medicine

## 2022-06-25 DIAGNOSIS — Z7901 Long term (current) use of anticoagulants: Secondary | ICD-10-CM | POA: Diagnosis not present

## 2022-07-28 DIAGNOSIS — Z7901 Long term (current) use of anticoagulants: Secondary | ICD-10-CM | POA: Diagnosis not present

## 2022-08-20 ENCOUNTER — Other Ambulatory Visit: Payer: Self-pay | Admitting: Family Medicine

## 2022-08-20 DIAGNOSIS — I10 Essential (primary) hypertension: Secondary | ICD-10-CM

## 2022-08-20 DIAGNOSIS — R600 Localized edema: Secondary | ICD-10-CM

## 2022-08-28 DIAGNOSIS — Z7901 Long term (current) use of anticoagulants: Secondary | ICD-10-CM | POA: Diagnosis not present

## 2022-09-01 ENCOUNTER — Other Ambulatory Visit: Payer: Self-pay | Admitting: Family Medicine

## 2022-09-01 DIAGNOSIS — I4811 Longstanding persistent atrial fibrillation: Secondary | ICD-10-CM

## 2022-09-01 DIAGNOSIS — I10 Essential (primary) hypertension: Secondary | ICD-10-CM

## 2022-09-01 DIAGNOSIS — G4700 Insomnia, unspecified: Secondary | ICD-10-CM

## 2022-09-01 DIAGNOSIS — F419 Anxiety disorder, unspecified: Secondary | ICD-10-CM

## 2022-09-10 ENCOUNTER — Ambulatory Visit (INDEPENDENT_AMBULATORY_CARE_PROVIDER_SITE_OTHER): Payer: Medicare HMO

## 2022-09-10 VITALS — Wt 234.0 lb

## 2022-09-10 DIAGNOSIS — Z0000 Encounter for general adult medical examination without abnormal findings: Secondary | ICD-10-CM

## 2022-09-10 NOTE — Progress Notes (Signed)
Virtual Visit via Telephone Note  I connected with  Erin Romero on 09/10/22 at 10:30 AM EDT by telephone and verified that I am speaking with the correct person using two identifiers.  Location: Patient: home Provider: [facility] Persons participating in the virtual visit: [place]   I discussed the limitations, risks, security and privacy concerns of performing an evaluation and management service by telephone and the availability of in person appointments. The patient expressed understanding and agreed to proceed.  Interactive audio and video telecommunications were attempted between this nurse and patient, however failed, due to patient having technical difficulties OR patient did not have access to video capability.  We continued and completed visit with audio only.  Some vital signs may be absent or patient reported.   Dionisio David, LPN  Subjective:   Erin Romero is a 71 y.o. female who presents for Medicare Annual (Subsequent) preventive examination.  Review of Systems     Cardiac Risk Factors include: advanced age (>8mn, >>52women);obesity (BMI >30kg/m2);sedentary lifestyle;hypertension;dyslipidemia     Objective:    There were no vitals filed for this visit. There is no height or weight on file to calculate BMI.     09/10/2022   10:40 AM 06/11/2022    1:48 PM 12/11/2021    1:32 PM 09/08/2021   11:07 AM 06/10/2021    1:43 PM 10/31/2020    9:06 AM 10/23/2020    1:02 PM  Advanced Directives  Does Patient Have a Medical Advance Directive? No Yes Yes Yes Yes No Yes  Type of Advance Directive   Living will;Healthcare Power of Attorney Living will Living will  Living will;Healthcare Power of Attorney  Does patient want to make changes to medical advance directive?    No - Patient declined     Copy of [place] Chart?       No - copy requested  Would patient like information on creating a medical advance directive? No -  Patient declined     No - Patient declined     Current Medications (verified) Outpatient Encounter Medications as of 09/10/2022  Medication Sig   acetaminophen (TYLENOL) 500 MG tablet Take 1,500 mg by mouth every 4 (four) hours as needed for moderate pain.    amLODipine (NORVASC) 2.5 MG tablet TAKE 1 TABLET EVERY DAY   atorvastatin (LIPITOR) 10 MG tablet TAKE 1 TABLET EVERY DAY   bisoprolol-hydrochlorothiazide (ZIAC) 10-6.25 MG tablet    Calcium Carb-Cholecalciferol (OYSTER SHELL CALCIUM + D PO) Take 1 tablet by mouth daily. 6027mcalcium plus 20 mg Vit D   Cholecalciferol (VITAMIN D-3) 25 MCG (1000 UT) CAPS Take 1,000 Units by mouth daily.    escitalopram (LEXAPRO) 10 MG tablet TAKE 1 TABLET EVERY DAY   hydrochlorothiazide (HYDRODIURIL) 12.5 MG tablet    hydrochlorothiazide (HYDRODIURIL) 25 MG tablet TAKE 1 TABLET EVERY DAY   Influenza vac split quadrivalent PF (FLUZONE HIGH-DOSE) 0.5 ML injection    losartan (COZAAR) 100 MG tablet Take by mouth.   metoprolol succinate (TOPROL-XL) 100 MG 24 hr tablet TAKE 2 TABLETS EVERY DAY   omeprazole (PRILOSEC) 20 MG capsule TAKE 1 CAPSULE EVERY DAY   omeprazole (PRILOSEC) 40 MG capsule    pneumococcal 23 valent vaccine (PNEUMOVAX 23) 25 MCG/0.5ML injection    potassium chloride SA (KLOR-CON M) 20 MEQ tablet TAKE 1 TABLET EVERY DAY   traZODone (DESYREL) 50 MG tablet TAKE 1 TABLET EVERY DAY   triamcinolone ointment (KENALOG) 0.5 %  Apply 1 application. topically 2 (two) times daily. Bilateral lower legs   warfarin (COUMADIN) 1 MG tablet Take 1 tablet by mouth daily.   warfarin (COUMADIN) 2 MG tablet Take 2 mg by mouth as directed. 1.5 mg x 5 days and 33m x 2 days   letrozole (FEMARA) 2.5 MG tablet  (Patient not taking: Reported on 09/10/2022)   lisinopril (ZESTRIL) 10 MG tablet  (Patient not taking: Reported on 09/10/2022)   lisinopril (ZESTRIL) 20 MG tablet Take 1 tablet by mouth daily. (Patient not taking: Reported on 09/10/2022)   No  facility-administered encounter medications on file as of 09/10/2022.    Allergies (verified) Docetaxel and Sulfa antibiotics   History: Past Medical History:  Diagnosis Date   Anxiety    [place]rial fibrillation [place] 2014   Breast cancer [place]   right breast   Dysrhythmia    Fibrocystic breast disease 2005   GERD (gastroesophageal reflux disease)    Hypertension 2005   Lump or mass in breast 2014   right mastectomy   Malignant ne[place] of central portion of female breast [place]24/[place]   Stage II,T2, N1a. ER/PR 90%, HER-2/neu: Not overexpressed. Mastectomy with sentinel node biopsy. 2 sentinel nodes negative, non-sentinel node w/ 5 mm macrometastatic foci in non-sentinel node.  Received post mastectomy radiation.    Past Surgical History:  Procedure Laterality Date   ABDOMINAL HYSTERECTOMY  1999   BREAST BIOPSY Left 2015   BENIGN FIBROTIC BREAST TISSUE WITH CLUSTERED APOCRINE   BREAST BIOPSY Left 11/02/2019   affirm bx, x marker, path pending   BREAST SURGERY Right 2005,2014   biopsy   BREAST SURGERY Right 2014   mastectomy   COLONOSCOPY  2002   COLONOSCOPY WITH PROPOFOL N/A 10/16/2015   Procedure: COLONOSCOPY WITH PROPOFOL;  Surgeon: JRobert Bellow MD;  Location: [facility] ENDOSCOPY;  Service: Endoscopy;  Laterality: N/A;   COLONOSCOPY WITH PROPOFOL N/A 10/31/2020   Procedure: COLONOSCOPY WITH PROPOFOL;  Surgeon: TVirgel Manifold MD;  Location: [facility] ENDOSCOPY;  Service: Endoscopy;  Laterality: N/A;   ESOPHAGOGASTRODUODENOSCOPY (EGD) WITH PROPOFOL N/A 10/31/2020   Procedure: ESOPHAGOGASTRODUODENOSCOPY (EGD) WITH PROPOFOL;  Surgeon: TVirgel Manifold MD;  Location: [facility] ENDOSCOPY;  Service: Endoscopy;  Laterality: N/A;   GIVENS CAPSULE STUDY N/A 12/27/2020   Procedure: GIVENS CAPSULE STUDY;  Surgeon: TVirgel Manifold MD;  Location: [facility] ENDOSCOPY;  Service: Endoscopy;  Laterality: N/A;   KNEE SURGERY Left 2005   MASTECTOMY Right 2014   radiation chemo    SIMPLE MASTECTOMY WITH AXILLARY SENTINEL NODE BIOPSY Left 11/30/2019   Procedure: SIMPLE MASTECTOMY WITH AXILLARY SENTINEL NODE BIOPSY;  Surgeon: BRobert Bellow MD;  Location: [facility] ORS;  Service: General;  Laterality: Left;   thumb surg Right 2004   TONSILLECTOMY     age of 89  Family History  Problem Relation Age of Onset   Heart disease Father    Hypertension Sister    Cancer Mother        cervical   Cancer Other        ovarian,liver,colon cancer-no family member listed   Osteoporosis Paternal Aunt    Osteoporosis Paternal Grandmother    Social History   Socioeconomic History   Marital status: Widowed    Spouse name: EChloe [place]  Number of children: 0   Years of education: 12   Highest education level: 12th grade  Occupational History   Occupation: retired  Tobacco Use   Smoking status: Never   Smokeless tobacco: Never  Vaping Use   Vaping Use: Never used  Substance and Sexual Activity   Alcohol use: No   Drug use: No   Sexual activity: Not Currently  Other Topics Concern   Not on file  Social History Narrative   Not on file   Social Determinants of Health   Financial Resource Strain: Low Risk  (09/10/2022)   Overall Financial Resource Strain (CARDIA)    Difficulty of Paying Living Expenses: Not hard at all  Food Insecurity: No Food Insecurity (09/10/2022)   Hunger Vital Sign    Worried About Running Out of Food in the Last Year: Never true    Ran Out of Food in the Last Year: Never true  Transportation Needs: No Transportation Needs (09/10/2022)   PRAPARE - Hydrologist (Medical): No    Lack of Transportation (Non-Medical): No  Physical Activity: Inactive (09/10/2022)   Exercise Vital Sign    Days of Exercise per Week: 0 days    Minutes of Exercise per Session: 0 min  Stress: No Stress Concern Present (09/10/2022)   [place]    Feeling of Stress : Not at all   Social Connections: Socially Isolated (09/10/2022)   Social Connection and Isolation Panel [NHANES]    Frequency of Communication with Friends and Family: Twice a week    Frequency of Social Gatherings with Friends and Family: Twice a week    Attends Religious Services: Never    Marine scientist or Organizations: No    Attends Archivist Meetings: Never    Marital Status: Widowed    Tobacco Counseling Counseling given: Not Answered   Clinical Intake:  Pre-visit preparation completed: Yes  Pain : No/denies pain     Nutritional Risks: None Diabetes: No  How often do you need to have someone help you when you read instructions, pamphlets, or other written materials from your doctor or pharmacy?: 1 - Never  Diabetic?no  Interpreter Needed?: No  Information entered by :: Kirke Shaggy, LPN   Activities of Daily Living    09/10/2022   10:42 AM 02/27/2022   10:23 AM  In your present state of health, do you have any difficulty performing the following activities:  Hearing? 0 1  Vision? 0 1  Difficulty concentrating or making decisions? 0 0  Walking or climbing stairs? 1 1  Dressing or bathing? 0 0  Doing errands, shopping? 0 0  Preparing Food and eating ? N   Using the Toilet? N   In the past six months, have you accidently leaked urine? N   Do you have problems with loss of bowel control? N   Managing your Medications? N   Managing your Finances? N   Housekeeping or managing your Housekeeping? N     Patient Care Team: [name], FNP as PCP - General (Family Medicine) Bary Castilla Forest Gleason, MD as Consulting Physician (General Surgery) Ubaldo Glassing Javier Docker, MD as Consulting Physician (Cardiology) Earlie Server, MD as Consulting Physician (Oncology) Cleaster Corin, OD (Optometry)  Indicate any recent Medical Services you may have received from other than [facility] providers in the past year (date may be approximate).     Assessment:   This is a routine  wellness examination for [place].  Hearing/Vision screen Hearing Screening - Comments:: No aids Vision Screening - Comments:: Wears glasses-   Dietary issues and exercise activities discussed: Current Exercise Habits: The patient does not participate in regular  exercise at present   Goals Addressed             This Visit's Progress    DIET - EAT MORE FRUITS AND VEGETABLES         Depression Screen    09/10/2022   10:38 AM 02/27/2022   10:23 AM 08/13/2021   11:42 AM 02/13/2021   11:26 AM 08/08/2020   11:12 AM 06/20/2020   10:44 AM 02/09/2020   10:21 AM  PHQ 2/9 Scores  PHQ - 2 Score 1 2 0 1 1 1  0  PHQ- 9 Score 3 4 3 4 4  1     Fall Risk    09/10/2022   10:42 AM 02/27/2022   10:22 AM 09/08/2021   11:09 AM 08/13/2021   11:42 AM 02/13/2021   11:26 AM  Fall Risk   Falls in the past year? 0 0 0 0 0  Number falls in past yr: 0  0 0 0  Injury with Fall? 0  0 0 0  Risk for fall due to : No Fall Risks  No Fall Risks No Fall Risks No Fall Risks  Follow up Falls prevention discussed;Falls evaluation completed    Falls evaluation completed    FALL RISK PREVENTION PERTAINING TO THE HOME:  Any stairs in or around the home? No  If so, are there any without handrails? Yes  Home free of loose throw rugs in walkways, pet beds, electrical cords, etc? Yes  Adequate lighting in your home to reduce risk of falls? Yes   ASSISTIVE DEVICES UTILIZED TO PREVENT FALLS:  Life alert? No  Use of a cane, walker or w/c? Yes  Grab bars in the bathroom? No  Shower chair or bench in shower? Yes  Elevated toilet seat or a handicapped toilet? Yes   Cognitive Function:        09/10/2022   10:44 AM 09/08/2021   11:12 AM 06/20/2019   11:00 AM 06/15/2018    2:44 PM 06/25/2017   10:53 AM  6CIT Screen  What Year? 0 points 0 points 0 points 0 points 0 points  What month? 0 points 0 points 0 points 0 points 0 points  What time? 0 points 0 points 0 points 0 points 0 points  Count back from 20 0 points 0  points 0 points 0 points 0 points  Months in reverse 0 points 0 points 0 points 0 points 0 points  Repeat phrase 0 points 0 points 0 points 0 points 4 points  Total Score 0 points 0 points 0 points 0 points 4 points    Immunizations Immunization History  Administered Date(s) Administered   Fluad Quad(high Dose 65+) 09/19/2019   Influenza, High Dose Seasonal PF 09/14/2018   Influenza,inj,Quad PF,6+ Mos 09/06/2015, 09/21/2017   Pneumococcal Conjugate-13 08/22/2015   Pneumococcal Polysaccharide-23 09/03/2016, 09/21/2017   Tdap 08/08/2020    TDAP status: Up to date  Flu Vaccine status: Due, Education has been provided regarding the importance of this vaccine. Advised may receive this vaccine at local pharmacy or Health Dept. Aware to provide a copy of the vaccination record if obtained from local pharmacy or Health Dept. Verbalized acceptance and understanding.  Pneumococcal vaccine status: Up to date  Covid-19 vaccine status: Declined, Education has been provided regarding the importance of this vaccine but patient still declined. Advised may receive this vaccine at local pharmacy or Health Dept.or vaccine clinic. Aware to provide a copy of the vaccination record if obtained from local pharmacy or  Health Dept. Verbalized acceptance and understanding.  Qualifies for Shingles Vaccine? Yes   Zostavax completed No   Shingrix Completed?: No.    Education has been provided regarding the importance of this vaccine. Patient has been advised to call insurance company to determine out of pocket expense if they have not yet received this vaccine. Advised may also receive vaccine at local pharmacy or Health Dept. Verbalized acceptance and understanding.  Screening Tests Health Maintenance  Topic Date Due   COVID-19 Vaccine (1) Never done   Zoster Vaccines- Shingrix (1 of 2) Never done   DEXA SCAN  09/08/2020   MAMMOGRAM  10/25/2021   TETANUS/TDAP  08/08/2030   COLONOSCOPY (Pts 45-73yr  Insurance coverage will need to be confirmed)  10/31/2030   Pneumonia Vaccine 72 Years old  Completed   Hepatitis C Screening  Completed   HPV VACCINES  Aged Out   INFLUENZA VACCINE  Discontinued    Health Maintenance  Health Maintenance Due  Topic Date Due   COVID-19 Vaccine (1) Never done   Zoster Vaccines- Shingrix (1 of 2) Never done   DEXA SCAN  09/08/2020   MAMMOGRAM  10/25/2021    Colorectal cancer screening: Type of screening: Colonoscopy. Completed 10/31/20. Repeat every 10 years  Mammogram status: No longer required due to no breasts.  Bone Density status: Completed 09/08/18. Results reflect: Bone density results: OSTEOPOROSIS. Repeat every 2 years.- declined referral  Lung Cancer Screening: (Low Dose CT Chest recommended if Age 858-80years, 30 pack-year currently smoking OR have quit w/in 15years.) does not qualify.   Additional Screening:  Hepatitis C Screening: does qualify; Completed 06/25/17  Vision Screening: Recommended annual ophthalmology exams for early detection of glaucoma and other disorders of the eye. Is the patient up to date with their annual eye exam?  Yes  Who is the provider or what is the name of the office in which the patient attends annual eye exams? Can't remember name If pt is not established with a provider, would they like to be referred to a provider to establish care? No .   Dental Screening: Recommended annual dental exams for proper oral hygiene  Community Resource Referral / Chronic Care Management: CRR required this visit?  No   CCM required this visit?  No      Plan:     I have personally reviewed and noted the following in the patient's chart:   Medical and social history Use of alcohol, tobacco or illicit drugs  Current medications and supplements including opioid prescriptions. Patient is not currently taking opioid prescriptions. Functional ability and status Nutritional status Physical activity Advanced  directives List of other physicians Hospitalizations, surgeries, and ER visits in previous 12 months Vitals Screenings to include cognitive, depression, and falls Referrals and appointments  In addition, I have reviewed and discussed with patient certain preventive protocols, quality metrics, and best practice recommendations. A written personalized care plan for preventive services as well as general preventive health recommendations were provided to patient.     LDionisio David LPN   97/32/2025  Nurse Notes: none

## 2022-09-10 NOTE — Patient Instructions (Signed)
Erin Romero , Thank you for taking time to come for your Medicare Wellness Visit. I appreciate your ongoing commitment to your health goals. Please review the following plan we discussed and let me know if I can assist you in the future.   Screening recommendations/referrals: Colonoscopy: 10/31/20 Mammogram: 11/02/19, no breasts Bone Density: 09/08/18 Recommended yearly ophthalmology/optometry visit for glaucoma screening and checkup Recommended yearly dental visit for hygiene and checkup  Vaccinations: Influenza vaccine: n/d Pneumococcal vaccine: 09/21/17 Tdap vaccine: 08/08/20 Shingles vaccine: n/d   Covid-19:n/d  Advanced directives: no  Conditions/risks identified: none  Next appointment: Follow up in one year for your annual wellness visit 09/14/23 @ 1:45 pm by phone   Preventive Care 65 Years and Older, Female Preventive care refers to lifestyle choices and visits with your health care provider that can promote health and wellness. What does preventive care include? A yearly physical exam. This is also called an annual well check. Dental exams once or twice a year. Routine eye exams. Ask your health care provider how often you should have your eyes checked. Personal lifestyle choices, including: Daily care of your teeth and gums. Regular physical activity. Eating a healthy diet. Avoiding tobacco and drug use. Limiting alcohol use. Practicing safe sex. Taking low-dose aspirin every day. Taking vitamin and mineral supplements as recommended by your health care provider. What happens during an annual well check? The services and screenings done by your health care provider during your annual well check will depend on your age, overall health, lifestyle risk factors, and family history of disease. Counseling  Your health care provider may ask you questions about your: Alcohol use. Tobacco use. Drug use. Emotional well-being. Home and relationship well-being. Sexual  activity. Eating habits. History of falls. Memory and ability to understand (cognition). Work and work Statistician. Reproductive health. Screening  You may have the following tests or measurements: Height, weight, and BMI. Blood pressure. Lipid and cholesterol levels. These may be checked every 5 years, or more frequently if you are over 47 years old. Skin check. Lung cancer screening. You may have this screening every year starting at age 80 if you have a 30-pack-year history of smoking and currently smoke or have quit within the past 15 years. Fecal occult blood test (FOBT) of the stool. You may have this test every year starting at age 2. Flexible sigmoidoscopy or colonoscopy. You may have a sigmoidoscopy every 5 years or a colonoscopy every 10 years starting at age 62. Hepatitis C blood test. Hepatitis B blood test. Sexually transmitted disease (STD) testing. Diabetes screening. This is done by checking your blood sugar (glucose) after you have not eaten for a while (fasting). You may have this done every 1-3 years. Bone density scan. This is done to screen for osteoporosis. You may have this done starting at age 67. Mammogram. This may be done every 1-2 years. Talk to your health care provider about how often you should have regular mammograms. Talk with your health care provider about your test results, treatment options, and if necessary, the need for more tests. Vaccines  Your health care provider may recommend certain vaccines, such as: Influenza vaccine. This is recommended every year. Tetanus, diphtheria, and acellular pertussis (Tdap, Td) vaccine. You may need a Td booster every 10 years. Zoster vaccine. You may need this after age 28. Pneumococcal 13-valent conjugate (PCV13) vaccine. One dose is recommended after age 78. Pneumococcal polysaccharide (PPSV23) vaccine. One dose is recommended after age 37. Talk to your health care  provider about which screenings and vaccines  you need and how often you need them. This information is not intended to replace advice given to you by your health care provider. Make sure you discuss any questions you have with your health care provider. Document Released: 01/04/2016 Document Revised: 08/27/2016 Document Reviewed: 10/09/2015 Elsevier Interactive Patient Education  2017 [place] Prevention in the Home Falls can cause injuries. They can happen to people of all ages. There are many things you can do to make your home safe and to help prevent falls. What can I do on the outside of my home? Regularly fix the edges of walkways and driveways and fix any cracks. Remove anything that might make you trip as you walk through a door, such as a raised step or threshold. Trim any bushes or trees on the path to your home. Use bright outdoor lighting. Clear any walking paths of anything that might make someone trip, such as rocks or tools. Regularly check to see if handrails are loose or broken. Make sure that both sides of any steps have handrails. Any raised decks and porches should have guardrails on the edges. Have any leaves, snow, or ice cleared regularly. Use sand or salt on walking paths during winter. Clean up any spills in your garage right away. This includes oil or grease spills. What can I do in the bathroom? Use night lights. Install grab bars by the toilet and in the tub and shower. Do not use towel bars as grab bars. Use non-skid mats or decals in the tub or shower. If you need to sit down in the shower, use a plastic, non-slip stool. Keep the floor dry. Clean up any water that spills on the floor as soon as it happens. Remove soap buildup in the tub or shower regularly. Attach bath mats securely with double-sided non-slip rug tape. Do not have throw rugs and other things on the floor that can make you trip. What can I do in the bedroom? Use night lights. Make sure that you have a light by your bed that  is easy to reach. Do not use any sheets or blankets that are too big for your bed. They should not hang down onto the floor. Have a firm chair that has side arms. You can use this for support while you get dressed. Do not have throw rugs and other things on the floor that can make you trip. What can I do in the kitchen? Clean up any spills right away. Avoid walking on wet floors. Keep items that you use a lot in easy-to-reach places. If you need to reach something above you, use a strong step stool that has a grab bar. Keep electrical cords out of the way. Do not use floor polish or wax that makes floors slippery. If you must use wax, use non-skid floor wax. Do not have throw rugs and other things on the floor that can make you trip. What can I do with my stairs? Do not leave any items on the stairs. Make sure that there are handrails on both sides of the stairs and use them. Fix handrails that are broken or loose. Make sure that handrails are as long as the stairways. Check any carpeting to make sure that it is firmly attached to the stairs. Fix any carpet that is loose or worn. Avoid having throw rugs at the top or bottom of the stairs. If you do have throw rugs, attach them to the  floor with carpet tape. Make sure that you have a light switch at the top of the stairs and the bottom of the stairs. If you do not have them, ask someone to add them for you. What else can I do to help prevent falls? Wear shoes that: Do not have high heels. Have rubber bottoms. Are comfortable and fit you well. Are closed at the toe. Do not wear sandals. If you use a stepladder: Make sure that it is fully opened. Do not climb a closed stepladder. Make sure that both sides of the stepladder are locked into place. Ask someone to hold it for you, if possible. Clearly mark and make sure that you can see: Any grab bars or handrails. First and last steps. Where the edge of each step is. Use tools that help you  move around (mobility aids) if they are needed. These include: Canes. Walkers. Scooters. Crutches. Turn on the lights when you go into a dark area. Replace any light bulbs as soon as they burn out. Set up your furniture so you have a clear path. Avoid moving your furniture around. If any of your floors are uneven, fix them. If there are any pets around you, be aware of where they are. Review your medicines with your doctor. Some medicines can make you feel dizzy. This can increase your chance of falling. Ask your doctor what other things that you can do to help prevent falls. This information is not intended to replace advice given to you by your health care provider. Make sure you discuss any questions you have with your health care provider. Document Released: 10/04/2009 Document Revised: 05/15/2016 Document Reviewed: 01/12/2015 Elsevier Interactive Patient Education  2017 [employer].

## 2022-09-13 ENCOUNTER — Other Ambulatory Visit: Payer: Self-pay | Admitting: Family Medicine

## 2022-09-13 DIAGNOSIS — E7800 Pure hypercholesterolemia, unspecified: Secondary | ICD-10-CM

## 2022-09-15 NOTE — Telephone Encounter (Signed)
Requested Prescriptions  Pending Prescriptions Disp Refills  . atorvastatin (LIPITOR) 10 MG tablet [Pharmacy Med Name: ATORVASTATIN CALCIUM 10 MG Tablet] 90 tablet 1    Sig: TAKE 1 TABLET EVERY DAY     Cardiovascular:  Antilipid - Statins Failed - 09/13/2022  2:33 AM      Failed - Lipid Panel in normal range within the last 12 months    Cholesterol, Total  Date Value Ref Range Status  02/27/2022 126 100 - 199 mg/dL Final   LDL Cholesterol (Calc)  Date Value Ref Range Status  11/27/2017 102 (H) mg/dL (calc) Final    Comment:    Reference range: <100 . Desirable range <100 mg/dL for primary prevention;   <70 mg/dL for patients with CHD or diabetic patients  with > or = 2 CHD risk factors. Marland Kitchen LDL-C is now calculated using the Martin-Hopkins  calculation, which is a validated novel method providing  better accuracy than the Friedewald equation in the  estimation of LDL-C.  Cresenciano Genre et al. Annamaria Helling. 3557;322(02): 2061-2068  (http://education.Quest[employer].com/faq/FAQ164)    LDL Chol Calc (NIH)  Date Value Ref Range Status  02/27/2022 50 0 - 99 mg/dL Final   HDL  Date Value Ref Range Status  02/27/2022 50 >39 mg/dL Final   Triglycerides  Date Value Ref Range Status  02/27/2022 152 (H) 0 - 149 mg/dL Final         Passed - Patient is not pregnant      Passed - Valid encounter within last 12 months    Recent Outpatient Visits          5 months ago Longstanding persistent atrial fibrillation [facility])   [facility] [name], FNP   1 year ago [facility] Bacigalupo, Dionne Bucy, MD   1 year ago Morbid obesity [facility])   [facility] [place] Post, [place]   2 years ago Annual physical exam   [facility] [place], [place]   2 years ago [place], [place]

## 2022-09-26 DIAGNOSIS — Z7901 Long term (current) use of anticoagulants: Secondary | ICD-10-CM | POA: Diagnosis not present

## 2022-10-22 DIAGNOSIS — G4733 Obstructive sleep apnea (adult) (pediatric): Secondary | ICD-10-CM | POA: Diagnosis not present

## 2022-10-22 DIAGNOSIS — Z79899 Other long term (current) drug therapy: Secondary | ICD-10-CM | POA: Diagnosis not present

## 2022-10-22 DIAGNOSIS — R0602 Shortness of breath: Secondary | ICD-10-CM | POA: Diagnosis not present

## 2022-10-22 DIAGNOSIS — I480 Paroxysmal atrial fibrillation: Secondary | ICD-10-CM | POA: Diagnosis not present

## 2022-10-22 DIAGNOSIS — Z7901 Long term (current) use of anticoagulants: Secondary | ICD-10-CM | POA: Diagnosis not present

## 2022-10-25 ENCOUNTER — Inpatient Hospital Stay
Admission: EM | Admit: 2022-10-25 | Discharge: 2022-10-29 | DRG: 871 | Disposition: A | Discharge status: Home Health | Payer: Medicare HMO | Attending: Internal Medicine | Admitting: Internal Medicine

## 2022-10-25 ENCOUNTER — Other Ambulatory Visit: Payer: Self-pay

## 2022-10-25 ENCOUNTER — Encounter: Payer: Self-pay | Admitting: Internal Medicine

## 2022-10-25 ENCOUNTER — Emergency Department: Payer: Medicare HMO

## 2022-10-25 DIAGNOSIS — J189 Pneumonia, unspecified organism: Secondary | ICD-10-CM | POA: Diagnosis not present

## 2022-10-25 DIAGNOSIS — F419 Anxiety disorder, unspecified: Secondary | ICD-10-CM | POA: Diagnosis present

## 2022-10-25 DIAGNOSIS — M810 Age-related osteoporosis without current pathological fracture: Secondary | ICD-10-CM | POA: Diagnosis present

## 2022-10-25 DIAGNOSIS — I4811 Longstanding persistent atrial fibrillation: Secondary | ICD-10-CM | POA: Diagnosis not present

## 2022-10-25 DIAGNOSIS — Z6841 Body Mass Index (BMI) 40.0 and over, adult: Secondary | ICD-10-CM | POA: Diagnosis not present

## 2022-10-25 DIAGNOSIS — Z9011 Acquired absence of right breast and nipple: Secondary | ICD-10-CM | POA: Diagnosis not present

## 2022-10-25 DIAGNOSIS — A419 Sepsis, unspecified organism: Principal | ICD-10-CM | POA: Diagnosis present

## 2022-10-25 DIAGNOSIS — Z853 Personal history of malignant neoplasm of breast: Secondary | ICD-10-CM | POA: Diagnosis not present

## 2022-10-25 DIAGNOSIS — I10 Essential (primary) hypertension: Secondary | ICD-10-CM | POA: Diagnosis present

## 2022-10-25 DIAGNOSIS — J9811 Atelectasis: Secondary | ICD-10-CM | POA: Diagnosis not present

## 2022-10-25 DIAGNOSIS — Z9221 Personal history of antineoplastic chemotherapy: Secondary | ICD-10-CM

## 2022-10-25 DIAGNOSIS — M8580 Other specified disorders of bone density and structure, unspecified site: Secondary | ICD-10-CM | POA: Diagnosis present

## 2022-10-25 DIAGNOSIS — E876 Hypokalemia: Secondary | ICD-10-CM | POA: Diagnosis present

## 2022-10-25 DIAGNOSIS — Z888 Allergy status to other drugs, medicaments and biological substances status: Secondary | ICD-10-CM

## 2022-10-25 DIAGNOSIS — I130 Hypertensive heart and chronic kidney disease with heart failure and stage 1 through stage 4 chronic kidney disease, or unspecified chronic kidney disease: Secondary | ICD-10-CM | POA: Diagnosis present

## 2022-10-25 DIAGNOSIS — I272 Pulmonary hypertension, unspecified: Secondary | ICD-10-CM | POA: Diagnosis not present

## 2022-10-25 DIAGNOSIS — Z800 Family history of malignant neoplasm of digestive organs: Secondary | ICD-10-CM | POA: Diagnosis not present

## 2022-10-25 DIAGNOSIS — Z923 Personal history of irradiation: Secondary | ICD-10-CM

## 2022-10-25 DIAGNOSIS — I5031 Acute diastolic (congestive) heart failure: Secondary | ICD-10-CM | POA: Diagnosis not present

## 2022-10-25 DIAGNOSIS — F32A Depression, unspecified: Secondary | ICD-10-CM | POA: Diagnosis not present

## 2022-10-25 DIAGNOSIS — R0602 Shortness of breath: Secondary | ICD-10-CM | POA: Diagnosis not present

## 2022-10-25 DIAGNOSIS — D0512 Intraductal carcinoma in situ of left breast: Secondary | ICD-10-CM | POA: Diagnosis present

## 2022-10-25 DIAGNOSIS — Z1152 Encounter for screening for COVID-19: Secondary | ICD-10-CM

## 2022-10-25 DIAGNOSIS — N182 Chronic kidney disease, stage 2 (mild): Secondary | ICD-10-CM | POA: Diagnosis present

## 2022-10-25 DIAGNOSIS — I4891 Unspecified atrial fibrillation: Secondary | ICD-10-CM | POA: Diagnosis not present

## 2022-10-25 DIAGNOSIS — Z8249 Family history of ischemic heart disease and other diseases of the circulatory system: Secondary | ICD-10-CM | POA: Diagnosis not present

## 2022-10-25 DIAGNOSIS — Z7901 Long term (current) use of anticoagulants: Secondary | ICD-10-CM | POA: Diagnosis not present

## 2022-10-25 DIAGNOSIS — Z79899 Other long term (current) drug therapy: Secondary | ICD-10-CM | POA: Diagnosis not present

## 2022-10-25 DIAGNOSIS — G4700 Insomnia, unspecified: Secondary | ICD-10-CM | POA: Diagnosis present

## 2022-10-25 DIAGNOSIS — R197 Diarrhea, unspecified: Secondary | ICD-10-CM | POA: Diagnosis present

## 2022-10-25 DIAGNOSIS — R6520 Severe sepsis without septic shock: Secondary | ICD-10-CM | POA: Diagnosis present

## 2022-10-25 DIAGNOSIS — Z90710 Acquired absence of both cervix and uterus: Secondary | ICD-10-CM

## 2022-10-25 DIAGNOSIS — R0600 Dyspnea, unspecified: Secondary | ICD-10-CM | POA: Diagnosis not present

## 2022-10-25 DIAGNOSIS — Z8262 Family history of osteoporosis: Secondary | ICD-10-CM

## 2022-10-25 DIAGNOSIS — E7800 Pure hypercholesterolemia, unspecified: Secondary | ICD-10-CM | POA: Diagnosis present

## 2022-10-25 DIAGNOSIS — J90 Pleural effusion, not elsewhere classified: Secondary | ICD-10-CM | POA: Diagnosis not present

## 2022-10-25 DIAGNOSIS — Z882 Allergy status to sulfonamides status: Secondary | ICD-10-CM

## 2022-10-25 DIAGNOSIS — K219 Gastro-esophageal reflux disease without esophagitis: Secondary | ICD-10-CM | POA: Diagnosis present

## 2022-10-25 LAB — CBC
HCT: 38.6 % (ref 36.0–46.0)
Hemoglobin: 12.9 g/dL (ref 12.0–15.0)
MCH: 29.9 pg (ref 26.0–34.0)
MCHC: 33.4 g/dL (ref 30.0–36.0)
MCV: 89.6 fL (ref 80.0–100.0)
Platelets: 236 10*3/uL (ref 150–400)
RBC: 4.31 MIL/uL (ref 3.87–5.11)
RDW: 14.9 % (ref 11.5–15.5)
WBC: 14.2 10*3/uL — ABNORMAL HIGH (ref 4.0–10.5)
nRBC: 0 % (ref 0.0–0.2)

## 2022-10-25 LAB — COMPREHENSIVE METABOLIC PANEL
ALT: 12 U/L (ref 0–44)
AST: 21 U/L (ref 15–41)
Albumin: 3.5 g/dL (ref 3.5–5.0)
Alkaline Phosphatase: 72 U/L (ref 38–126)
Anion gap: 7 (ref 5–15)
BUN: 15 mg/dL (ref 8–23)
CO2: 25 mmol/L (ref 22–32)
Calcium: 8.6 mg/dL — ABNORMAL LOW (ref 8.9–10.3)
Chloride: 106 mmol/L (ref 98–111)
Creatinine, Ser: 0.67 mg/dL (ref 0.44–1.00)
GFR, Estimated: >60 mL/min (ref >60)
Glucose, Bld: 127 mg/dL — ABNORMAL HIGH (ref 70–99)
Potassium: 3.8 mmol/L (ref 3.5–5.1)
Sodium: 138 mmol/L (ref 135–145)
Total Bilirubin: 1.5 mg/dL — ABNORMAL HIGH (ref 0.3–1.2)
Total Protein: 7.9 g/dL (ref 6.5–8.1)

## 2022-10-25 LAB — RESP PANEL BY RT-PCR (FLU A&B, COVID) ARPGX2
Influenza A by PCR: NEGATIVE
Influenza B by PCR: NEGATIVE
SARS Coronavirus 2 by RT PCR: NEGATIVE

## 2022-10-25 LAB — PROTIME-INR
INR: 2.6 — ABNORMAL HIGH (ref 0.8–1.2)
Prothrombin Time: 27.6 seconds — ABNORMAL HIGH (ref 11.4–15.2)

## 2022-10-25 LAB — LACTIC ACID, PLASMA
Lactic Acid, Venous: 2 mmol/L (ref 0.5–1.9)
Lactic Acid, Venous: 2.1 mmol/L (ref 0.5–1.9)

## 2022-10-25 LAB — BRAIN NATRIURETIC PEPTIDE: B Natriuretic Peptide: 279.2 pg/mL — ABNORMAL HIGH (ref 0.0–100.0)

## 2022-10-25 LAB — TROPONIN I (HIGH SENSITIVITY)
Troponin I (High Sensitivity): 13 ng/L (ref <18)
Troponin I (High Sensitivity): 16 ng/L (ref <18)

## 2022-10-25 LAB — PROCALCITONIN: Procalcitonin: <0.1 ng/mL

## 2022-10-25 MED ORDER — SODIUM CHLORIDE 0.9 % IV SOLN
2.0000 g | Freq: Once | INTRAVENOUS | Status: AC
  Administered 2022-10-25: 2 g via INTRAVENOUS
  Filled 2022-10-25: qty 20

## 2022-10-25 MED ORDER — ATORVASTATIN CALCIUM 10 MG PO TABS
10.0000 mg | ORAL_TABLET | Freq: Every day | ORAL | Status: DC
  Administered 2022-10-26 – 2022-10-29 (×4): 10 mg via ORAL
  Filled 2022-10-25 (×4): qty 1

## 2022-10-25 MED ORDER — HYDROCHLOROTHIAZIDE 25 MG PO TABS: 25.0000 mg | ORAL_TABLET | Freq: Every day | ORAL | Status: DC

## 2022-10-25 MED ORDER — SODIUM CHLORIDE 0.9 % IV SOLN
2.0000 g | INTRAVENOUS | Status: AC
  Administered 2022-10-26 – 2022-10-29 (×4): 2 g via INTRAVENOUS
  Filled 2022-10-25 (×4): qty 2

## 2022-10-25 MED ORDER — WARFARIN - PHARMACIST DOSING INPATIENT
Freq: Every day | Status: DC
  Filled 2022-10-25: qty 1

## 2022-10-25 MED ORDER — SODIUM CHLORIDE 0.9 % IV SOLN
500.0000 mg | INTRAVENOUS | Status: DC
  Administered 2022-10-26: 500 mg via INTRAVENOUS
  Filled 2022-10-25: qty 5
  Filled 2022-10-25: qty 500

## 2022-10-25 MED ORDER — WARFARIN SODIUM 2 MG PO TABS
2.0000 mg | ORAL_TABLET | ORAL | Status: DC
  Administered 2022-10-28: 2 mg via ORAL
  Filled 2022-10-25: qty 1

## 2022-10-25 MED ORDER — ONDANSETRON HCL 4 MG/2ML IJ SOLN: 4.0000 mg | Freq: Four times a day (QID) | INTRAMUSCULAR | Status: DC | PRN

## 2022-10-25 MED ORDER — ENOXAPARIN SODIUM 40 MG/0.4ML IJ SOSY: 40.0000 mg | PREFILLED_SYRINGE | INTRAMUSCULAR | Status: DC

## 2022-10-25 MED ORDER — ONDANSETRON HCL 4 MG PO TABS: 4.0000 mg | ORAL_TABLET | Freq: Four times a day (QID) | ORAL | Status: DC | PRN

## 2022-10-25 MED ORDER — ACETAMINOPHEN 650 MG RE SUPP: 650.0000 mg | Freq: Four times a day (QID) | RECTAL | Status: DC | PRN

## 2022-10-25 MED ORDER — IPRATROPIUM-ALBUTEROL 0.5-2.5 (3) MG/3ML IN SOLN
3.0000 mL | Freq: Three times a day (TID) | RESPIRATORY_TRACT | Status: AC
  Administered 2022-10-25 – 2022-10-26 (×2): 3 mL via RESPIRATORY_TRACT
  Filled 2022-10-25 (×2): qty 3

## 2022-10-25 MED ORDER — METOPROLOL SUCCINATE ER 50 MG PO TB24
200.0000 mg | ORAL_TABLET | Freq: Every day | ORAL | Status: DC
  Administered 2022-10-26 – 2022-10-29 (×4): 200 mg via ORAL
  Filled 2022-10-25 (×4): qty 4

## 2022-10-25 MED ORDER — ACETAMINOPHEN 325 MG PO TABS
650.0000 mg | ORAL_TABLET | Freq: Four times a day (QID) | ORAL | Status: DC | PRN
  Administered 2022-10-26 – 2022-10-29 (×4): 650 mg via ORAL
  Filled 2022-10-25 (×4): qty 2

## 2022-10-25 MED ORDER — IPRATROPIUM-ALBUTEROL 0.5-2.5 (3) MG/3ML IN SOLN
3.0000 mL | Freq: Once | RESPIRATORY_TRACT | Status: AC
  Administered 2022-10-25: 3 mL via RESPIRATORY_TRACT
  Filled 2022-10-25: qty 3

## 2022-10-25 MED ORDER — TRAZODONE HCL 50 MG PO TABS
50.0000 mg | ORAL_TABLET | Freq: Every day | ORAL | Status: DC
  Administered 2022-10-25 – 2022-10-28 (×4): 50 mg via ORAL
  Filled 2022-10-25 (×5): qty 1

## 2022-10-25 MED ORDER — ENOXAPARIN SODIUM 60 MG/0.6ML IJ SOSY: 0.5000 mg/kg | PREFILLED_SYRINGE | INTRAMUSCULAR | Status: DC

## 2022-10-25 MED ORDER — ESCITALOPRAM OXALATE 10 MG PO TABS
10.0000 mg | ORAL_TABLET | Freq: Every day | ORAL | Status: DC
  Administered 2022-10-26 – 2022-10-29 (×4): 10 mg via ORAL
  Filled 2022-10-25 (×4): qty 1

## 2022-10-25 MED ORDER — WARFARIN SODIUM 1 MG PO TABS
1.5000 mg | ORAL_TABLET | ORAL | Status: DC
  Administered 2022-10-26 – 2022-10-27 (×2): 1.5 mg via ORAL
  Filled 2022-10-25 (×3): qty 1

## 2022-10-25 MED ORDER — LACTATED RINGERS IV BOLUS
500.0000 mL | Freq: Once | INTRAVENOUS | Status: AC
  Administered 2022-10-25: 500 mL via INTRAVENOUS

## 2022-10-25 MED ORDER — LOSARTAN POTASSIUM 50 MG PO TABS: 100.0000 mg | ORAL_TABLET | Freq: Every day | ORAL | Status: DC

## 2022-10-25 MED ORDER — WARFARIN SODIUM 1 MG PO TABS
1.5000 mg | ORAL_TABLET | Freq: Once | ORAL | Status: AC
  Administered 2022-10-25: 1.5 mg via ORAL
  Filled 2022-10-25 (×2): qty 1

## 2022-10-25 MED ORDER — HYDRALAZINE HCL 20 MG/ML IJ SOLN: 5.0000 mg | Freq: Four times a day (QID) | INTRAMUSCULAR | Status: DC | PRN

## 2022-10-25 MED ORDER — SENNOSIDES-DOCUSATE SODIUM 8.6-50 MG PO TABS: 1.0000 | ORAL_TABLET | Freq: Every evening | ORAL | Status: DC | PRN

## 2022-10-25 MED ORDER — PANTOPRAZOLE SODIUM 40 MG PO TBEC
40.0000 mg | DELAYED_RELEASE_TABLET | Freq: Every day | ORAL | Status: DC
  Administered 2022-10-26 – 2022-10-29 (×4): 40 mg via ORAL
  Filled 2022-10-25 (×4): qty 1

## 2022-10-25 MED ORDER — AMLODIPINE BESYLATE 5 MG PO TABS
2.5000 mg | ORAL_TABLET | Freq: Every day | ORAL | Status: DC
  Administered 2022-10-25 – 2022-10-29 (×5): 2.5 mg via ORAL
  Filled 2022-10-25 (×5): qty 1

## 2022-10-25 MED ORDER — SODIUM CHLORIDE 0.9 % IV SOLN
500.0000 mg | Freq: Once | INTRAVENOUS | Status: AC
  Administered 2022-10-25: 500 mg via INTRAVENOUS
  Filled 2022-10-25: qty 5

## 2022-10-25 NOTE — Progress Notes (Signed)
PHARMACIST - PHYSICIAN COMMUNICATION  CONCERNING:  Enoxaparin (Lovenox) for DVT Prophylaxis    RECOMMENDATION: Patient was prescribed enoxaprin '40mg'$  q24 hours for VTE prophylaxis.   Filed Weights   10/25/22 1633  Weight: 106.1 kg (234 lb)    Body mass index is 40.17 kg/m.  Estimated Creatinine Clearance: 75.6 mL/min (by C-G formula based on SCr of 0.67 mg/dL).   Based on [place] patient is candidate for enoxaparin 0.'5mg'$ /kg TBW SQ every 24 hours based on BMI being >30.  DESCRIPTION: Pharmacy has adjusted enoxaparin dose per [facility] policy.  Patient is now receiving enoxaparin 53 mg every 24 hours    Odell Fasching Rodriguez-Guzman PharmD, BCPS 10/25/2022 6:36 PM

## 2022-10-25 NOTE — H&P (Signed)
History and Physical   Erin Romero HFW:[id number] DOB: 1951/02/17 DOA: 10/25/2022  PCP: [name], FNP  Outpatient Specialists: Dr. Conni Elliot clinic cardiology Patient coming from: Home  I have personally briefly reviewed patient's old medical records in [place].  Chief Concern: shortness of breath  HPI: [name] is a 71 year old female with history of right breast cancer status post radical mastectomy, stage IIb, osteoporosis, hypertension, who presents emergency department for chief concerns of shortness of breath for 2 days.  Initial vitals in the emergency department showed temperature 100.4, respiration rate of 25, heart rate of 80, blood pressure 158/98, SPO2 of 91% on room air.  Serum sodium is 138, potassium 3.8, chloride 106, bicarb 25, BUN of 15, serum creatinine 0.67, nonfasting blood glucose 127, WBC 14.2, hemoglobin 12.9, platelets of 236.  BNP is 279.2.  High sensitive troponin was 13.  COVID/influenza A/influenza B PCR are pending.  ED treatment: DuoNebs one-time dose, azithromycin, ceftriaxone 2 g IV.  At bedside patient is awake alert and oriented to self, age, location.  She does not appear to be in acute distress.  She is sitting up and taking a sip of water.  She started having shortness of breath since Thursday. She endorses diarrhea since Thursday.   She denies cough, chest pain, fever, dysuria, syncope. She denies known sick contact.  Social history: She lives by  herself. She denies tobacco, etoh, and recreational drug use. 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   ROS: Constitutional: no weight change, no fever ENT/Mouth: no sore throat, no rhinorrhea Eyes: no eye pain, no vision changes Cardiovascular: no chest pain, + dyspnea,  no edema, no palpitations Respiratory: no cough, no sputum, no wheezing Gastrointestinal: no nausea, no vomiting, no diarrhea, no constipation Genitourinary: no  urinary incontinence, no dysuria, no hematuria Musculoskeletal: no arthralgias, no myalgias Skin: no skin lesions, no pruritus, Neuro: + weakness, no loss of consciousness, no syncope Psych: no anxiety, no depression, + decrease appetite Heme/Lymph: no bruising, no bleeding  ED Course: Discussed with emergency medicine provider, patient requiring hospitalization for chief concerns of multilobar pneumonia.  Assessment/Plan  Principal Problem:   Sepsis due to pneumonia [facility]) Active Problems:   Anxiety   A-fib (HCC)   Clinical depression   Hypercholesteremia   Essential hypertension   Insomnia   Osteoporosis   Morbid obesity ([place])   Ductal carcinoma in situ (DCIS) of left breast   Moderate pulmonary hypertension (HCC)   Assessment and Plan:  * Sepsis due to pneumonia ([place]) Multilobar pneumonia specifically the right lower lobe - Continue with azithromycin and ceftriaxone 2 g IV - Blood cultures x2 have been ordered and pending collection - Check procalcitonin - Admit to telemetry medical, inpatient  Ductal carcinoma in situ (DCIS) of left breast - Patient states she is no longer on chemotherapy and she is status postmastectomy - Continue outpatient follow-up  Morbid obesity ([place]) - This complicates overall care and prognosis.    Insomnia - Trazodone qhs resumed  Essential hypertension - Amlodipine 2.5 mg daily, hydrochlorothiazide 25 mg daily, losartan 100 mg daily, metoprolol succinate 200 mg daily resumed - Hydralazine 5 mg IV every 6 hours as needed for SBP greater than 180, 4 days ordered  Hypercholesteremia - atorvastatin 10 mg daily  Clinical depression - Escitalopram 10 mg daily resumed - Trazodone 50 mg daily resume  A-fib (HCC) - Warfarin per pharmacy ordered - Metoprolol succinate 200 mg daily resumed  Anxiety - escitalopram resumed  Chart reviewed.   DVT prophylaxis: Warfarin per pharmacy Code Status: Full code Diet: Heart healthy Family  Communication: No Disposition Plan: Pending clinical course Consults called: None at this time Admission status: Telemetry medical, inpatient  Past Medical History:  Diagnosis Date   Anxiety    Atrial fibrillation ([place]) 2014   Breast cancer ([place]) 2014   right breast   Dysrhythmia    Fibrocystic breast disease 2005   GERD (gastroesophageal reflux disease)    Hypertension 2005   Lump or mass in breast 2014   right mastectomy   Malignant neoplasm of central portion of female breast ([place]) 01/24/2013   Stage II,T2, N1a. ER/PR 90%, HER-2/neu: Not overexpressed. Mastectomy with sentinel node biopsy. 2 sentinel nodes negative, non-sentinel node w/ 5 mm macrometastatic foci in non-sentinel node.  Received post mastectomy radiation.    Past Surgical History:  Procedure Laterality Date   ABDOMINAL HYSTERECTOMY  1999   BREAST BIOPSY Left 2015   BENIGN FIBROTIC BREAST TISSUE WITH CLUSTERED APOCRINE   BREAST BIOPSY Left 11/02/2019   affirm bx, x marker, path pending   BREAST SURGERY Right 2005,2014   biopsy   BREAST SURGERY Right 2014   mastectomy   COLONOSCOPY  2002   COLONOSCOPY WITH PROPOFOL N/A 10/16/2015   Procedure: COLONOSCOPY WITH PROPOFOL;  Surgeon: Robert Bellow, MD;  Location: [facility] ENDOSCOPY;  Service: Endoscopy;  Laterality: N/A;   COLONOSCOPY WITH PROPOFOL N/A 10/31/2020   Procedure: COLONOSCOPY WITH PROPOFOL;  Surgeon: [name], MD;  Location: [facility] ENDOSCOPY;  Service: Endoscopy;  Laterality: N/A;   ESOPHAGOGASTRODUODENOSCOPY (EGD) WITH PROPOFOL N/A 10/31/2020   Procedure: ESOPHAGOGASTRODUODENOSCOPY (EGD) WITH PROPOFOL;  Surgeon: [name], MD;  Location: [facility] ENDOSCOPY;  Service: Endoscopy;  Laterality: N/A;   GIVENS CAPSULE STUDY N/A 12/27/2020   Procedure: GIVENS CAPSULE STUDY;  Surgeon: [name], MD;  Location: [facility] ENDOSCOPY;  Service: Endoscopy;  Laterality: N/A;   KNEE SURGERY Left 2005   MASTECTOMY Right 2014   radiation chemo    SIMPLE MASTECTOMY WITH AXILLARY SENTINEL NODE BIOPSY Left 11/30/2019   Procedure: SIMPLE MASTECTOMY WITH AXILLARY SENTINEL NODE BIOPSY;  Surgeon: Robert Bellow, MD;  Location: [facility] ORS;  Service: General;  Laterality: Left;   thumb surg Right 2004   TONSILLECTOMY     age of 3   Social History:  reports that she has never smoked. She has never used smokeless tobacco. She reports that she does not drink alcohol and does not use drugs.  Allergies  Allergen Reactions   Docetaxel Itching and Rash    Other reaction(s): edema   Sulfa Antibiotics Hives   Family History  Problem Relation Age of Onset   Heart disease Father    Hypertension Sister    Cancer Mother        cervical   Cancer Other        ovarian,liver,colon cancer-no family member listed   Osteoporosis Paternal Aunt    Osteoporosis Paternal Grandmother    Family history: Family history reviewed and not pertinent  Prior to Admission medications   Medication Sig Start Date End Date Taking? Authorizing Provider  acetaminophen (TYLENOL) 500 MG tablet Take 1,500 mg by mouth every 4 (four) hours as needed for moderate pain.     [provider]  amLODipine (NORVASC) 2.5 MG tablet TAKE 1 TABLET EVERY DAY 09/03/22   Tally Joe T, FNP  atorvastatin (LIPITOR) 10 MG tablet TAKE 1 TABLET EVERY DAY 09/15/22  [name], FNP  bisoprolol-hydrochlorothiazide [facility]) 10-6.25 MG tablet     [provider]  Calcium Carb-Cholecalciferol (OYSTER SHELL CALCIUM + D PO) Take 1 tablet by mouth daily. 672m calcium plus 20 mg Vit D    [provider]  Cholecalciferol (VITAMIN D-3) 25 MCG (1000 UT) CAPS Take 1,000 Units by mouth daily.     [provider]  escitalopram (LEXAPRO) 10 MG tablet TAKE 1 TABLET EVERY DAY 09/03/22   PGwyneth Sprout FNP  hydrochlorothiazide (HYDRODIURIL) 12.5 MG tablet     [provider]  hydrochlorothiazide (HYDRODIURIL) 25 MG tablet TAKE 1 TABLET EVERY DAY 08/20/22   PGwyneth Sprout FNP  Influenza vac split quadrivalent PF (FLUZONE HIGH-DOSE) 0.5 ML injection     [provider]  letrozole ([facility] 2.5 MG tablet     [provider]  lisinopril (ZESTRIL) 10 MG tablet     [provider]  lisinopril (ZESTRIL) 20 MG tablet Take 1 tablet by mouth daily. Patient not taking: Reported on 09/10/2022    [provider]  losartan (COZAAR) 100 MG tablet Take by mouth. 10/16/21 10/16/22  [provider]  metoprolol succinate (TOPROL-XL) 100 MG 24 hr tablet TAKE 2 TABLETS EVERY DAY 09/03/22   PGwyneth Sprout FNP  omeprazole (PRILOSEC) 20 MG capsule TAKE 1 CAPSULE EVERY DAY 06/16/22   PGwyneth Sprout FNP  omeprazole (PRILOSEC) 40 MG capsule     [provider]  pneumococcal 23 valent vaccine (PNEUMOVAX 23) 25 MCG/0.5ML injection     [provider]  potassium chloride SA (KLOR-CON M) 20 MEQ tablet TAKE 1 TABLET EVERY DAY 09/03/22   PTally JoeT, FNP  traZODone (DESYREL) 50 MG tablet TAKE 1 TABLET EVERY DAY 09/03/22   PGwyneth Sprout FNP  triamcinolone ointment (KENALOG) 0.5 % Apply 1 application. topically 2 (two) times daily. Bilateral lower legs 03/27/22   PTally JoeT, FNP  warfarin (COUMADIN) 1 MG tablet Take 1 tablet by mouth daily. 08/30/20   [provider]  warfarin (COUMADIN) 2 MG tablet Take 2 mg by mouth as directed. 1.5 mg x 5 days and 237mx 2 days 11/16/19   [provider]   Physical Exam: Vitals:   10/25/22 1832 10/25/22 1900 10/25/22 1914 10/25/22 1924  BP: (!) 161/71 (!) 180/96  (!) 170/90  Pulse: 94 60 79 84  Resp: (!) 24   (!) 24  Temp: (!) 100.4 F (38 C)   98.9 F (37.2 C)  TempSrc: Oral   Oral  SpO2: 97% 95% 96% 95%  Weight:      Height:       Constitutional: appears age-appropriate, NAD, calm, comfortable Eyes: PERRL, lids and conjunctivae normal ENMT: Mucous membranes are moist. Posterior pharynx clear of any exudate or lesions. Age-appropriate dentition. Hearing  appropriate Neck: normal, supple, no masses, no thyromegaly Respiratory: clear to auscultation bilaterally, no wheezing, no crackles.  Increased respiratory effort. + accessory muscle use.  Nasal cannula in place Cardiovascular: Regular rate and rhythm, no murmurs / rubs / gallops. No extremity edema. 2+ pedal pulses. No carotid bruits.  Abdomen: Morbidly obese abdomen, no tenderness, no masses palpated, no hepatosplenomegaly. Bowel sounds positive.  Musculoskeletal: no clubbing / cyanosis. No joint deformity upper and lower extremities. Good ROM, no contractures, no atrophy. Normal muscle tone.  Skin: no rashes, lesions, ulcers. No induration Neurologic: Sensation intact. Strength 5/5 in all 4.  Psychiatric: Normal judgment and insight. Alert and oriented x 3. Normal  mood.   EKG: independently reviewed, showing atrial fibrillation, rate of 82, QTc 434  Chest x-ray on Admission: I personally reviewed and I agree with radiologist reading as below.  DG Chest Portable 1 View  Result Date: 10/25/2022 CLINICAL DATA:  Shortness of breath EXAM: PORTABLE CHEST 1 VIEW COMPARISON:  03/07/2013 FINDINGS: Multifocal patchy opacities in the lungs bilaterally, right lower lobe predominant, suspicious for pneumonia. No pleural effusion or pneumothorax. The heart is normal in size.  Thoracic aortic atherosclerosis. IMPRESSION: Multifocal pneumonia, right lower lobe predominant. Electronically Signed   By: Julian Hy M.D.   On: 10/25/2022 17:04    Labs on Admission: I have personally reviewed following labs  CBC: Recent Labs  Lab 10/25/22 1635  WBC 14.2*  HGB 12.9  HCT 38.6  MCV 89.6  PLT 754   Basic Metabolic Panel: Recent Labs  Lab 10/25/22 1641  NA 138  K 3.8  CL 106  CO2 25  GLUCOSE 127*  BUN 15  CREATININE 0.67  CALCIUM 8.6*   GFR: Estimated Creatinine Clearance: 75.6 mL/min (by C-G formula based on SCr of 0.67 mg/dL).  Liver Function Tests: Recent Labs  Lab 10/25/22 1641   AST 21  ALT 12  ALKPHOS 72  BILITOT 1.5*  PROT 7.9  ALBUMIN 3.5   Coagulation Profile: Recent Labs  Lab 10/25/22 1635  INR 2.6*   Urine analysis:    Component Value Date/Time   BILIRUBINUR negative 07/26/2015 1625   PROTEINUR 300 (3+) 07/26/2015 1625   UROBILINOGEN 0.2 07/26/2015 1625   NITRITE negative 07/26/2015 1625   LEUKOCYTESUR small (1+) (A) 07/26/2015 1625   Dr. Tobie Poet [facility]  If 7PM-7AM, please contact overnight-coverage provider If 7AM-7PM, please contact day coverage provider [email or web address]  10/25/2022, 8:04 PM

## 2022-10-25 NOTE — Assessment & Plan Note (Signed)
-   This complicates overall care and prognosis.  

## 2022-10-25 NOTE — ED Triage Notes (Signed)
Pt arrives with c/o SOb that has gotten worse over the past couple of days. Pt has hx of a.fib and takes coumadin. Pt denies sick symptoms/fevers.

## 2022-10-25 NOTE — Assessment & Plan Note (Signed)
-   Trazodone qhs resumed

## 2022-10-25 NOTE — Sepsis Progress Note (Signed)
Elink monitoring this code sepsis

## 2022-10-25 NOTE — Assessment & Plan Note (Signed)
-   atorvastatin 10 mg daily

## 2022-10-25 NOTE — Consult Note (Signed)
ANTICOAGULATION CONSULT NOTE - Consult  Pharmacy Consult for warfarin Indication: atrial fibrillation  Allergies  Allergen Reactions   Docetaxel Itching and Rash    Other reaction(s): edema   Sulfa Antibiotics Hives    Patient Measurements: Height: '5\' 4"'$  (162.6 cm) Weight: 106.1 kg (234 lb) IBW/kg (Calculated) : 54.7  Vital Signs: Temp: 98.9 F (37.2 C) (11/04 1924) Temp Source: Oral (11/04 1924) BP: 170/90 (11/04 1924) Pulse Rate: 84 (11/04 1924)  Labs: Recent Labs    10/25/22 1635 10/25/22 1641  HGB 12.9  --   HCT 38.6  --   PLT 236  --   LABPROT 27.6*  --   INR 2.6*  --   CREATININE  --  0.67  TROPONINIHS 13  --     Estimated Creatinine Clearance: 75.6 mL/min (by C-G formula based on SCr of 0.67 mg/dL).   Medications:  PTA: Takes '2mg'$  on Tue/Thurs & 1.'5mg'$  on all other days (Last dose 11/3 1.'5mg'$  dose) Inpatient: order for [facility] (no doses given) >> resumed home warfarin  Assessment: 71yo F w/ h/o breast cancer, IDA, HTN, and atrial fibrillation (on VKA).   Date Time INR Dose/Comment 11/3 PTA Unk 1.'5mg'$  11/4 1635 2.6 1.'5mg'$ ; therapeutic        Goal of Therapy:  INR 2-3 Monitor platelets by anticoagulation protocol: Yes   Plan:  INR therapeutic. Pt currently on azithromycin, watch closely for potential of adjustments. Continue warfarin as per home dosing plan currently.  Dose 1.'5mg'$  x1 tonight; then ordered for normal schedule of '2mg'$  on Tue/Thurs & 1.'5mg'$  on all other days. CTM INR daily and survey for new DDI's  Lorna Dibble 10/25/2022,7:36 PM

## 2022-10-25 NOTE — ED Provider Notes (Signed)
[facility] Provider Note    Event Date/Time   First MD Initiated Contact with Patient 10/25/22 1640     (approximate)   History   Shortness of Breath   HPI  Erin Romero is a 71 y.o. female   who on review of history from June 21 of this year has a history of breast cancer atrial fibrillation on anticoagulation  Patient reports last 2 days or so she has been feeling fatigued and had a cough.  Also notes a very slight bit of wheezing seems to come from her right lung.  She been feeling fatigued, coughing frequently and has had increasing shortness of breath for the last day or so today at became severe.  She has not noticed a fever but is felt fatigued coughing frequently and she and her family are concerned she may be developing pneumonia.  Denies swelling in her lower legs except for her chronic edema.  Reports she saw cardiology just a couple days ago and everything checked out okay at that point.  Has not noticed any sudden swelling or weight gain  She aches her Coumadin daily for A-fib       Physical Exam   Triage Vital Signs: ED Triage Vitals  Enc Vitals Group     BP 10/25/22 1634 (!) 158/98     Pulse Rate 10/25/22 1634 80     Resp 10/25/22 1634 (!) 25     Temp --      Temp src --      SpO2 10/25/22 1634 91 %     Weight 10/25/22 1633 234 lb (106.1 kg)     Height 10/25/22 1633 '5\' 4"'$  (1.626 m)     Head Circumference --      Peak Flow --      Pain Score 10/25/22 1632 0     Pain Loc --      Pain Edu? --      Excl. in [place]? --     Most recent vital signs: Vitals:   10/25/22 1900 10/25/22 1914  BP: (!) 180/96   Pulse: 60 79  Resp:    Temp:    SpO2: 95% 96%     General: Awake, no distress.  She does appear to slightly tachypneic.  She does not appear however in acute distress but oxygen saturation 90% on room air, I placed her on 2 L nasal cannula oxygen saturation to 98% thereafter. CV:  Good peripheral perfusion.  Warm  well-perfused.  ROM normal rate but slightly irregular rhythm Resp:  Mild tachypnea.  She has mild accessory muscle use.  Exhibits crackles in the right lower lung fields and slight wheezing in the right lower lung field only.  The left lung appears clear.  Upper right lobe clear Abd:  No distention.  Soft nontender Other:  Mild bilateral lower extremity edema patient reports this to be chronic and typical for her without significant change or increase.  She does not appear overtly volume overloaded on exam.   ED Results / Procedures / Treatments   Labs (all labs ordered are listed, but only abnormal results are displayed) Labs Reviewed  CBC - Abnormal; Notable for the following components:      Result Value   WBC 14.2 (*)    All other components within normal limits  PROTIME-INR - Abnormal; Notable for the following components:   Prothrombin Time 27.6 (*)    INR 2.6 (*)    All other components  within normal limits  COMPREHENSIVE METABOLIC PANEL - Abnormal; Notable for the following components:   Glucose, Bld 127 (*)    Calcium 8.6 (*)    Total Bilirubin 1.5 (*)    All other components within normal limits  BRAIN NATRIURETIC PEPTIDE - Abnormal; Notable for the following components:   B Natriuretic Peptide 279.2 (*)    All other components within normal limits  LACTIC ACID, PLASMA - Abnormal; Notable for the following components:   Lactic Acid, Venous 2.0 (*)    All other components within normal limits  RESP PANEL BY RT-PCR (FLU A&B, COVID) ARPGX2  CULTURE, BLOOD (ROUTINE X 2)  CULTURE, BLOOD (ROUTINE X 2)  CULTURE, BLOOD (ROUTINE X 2)  CULTURE, BLOOD (ROUTINE X 2)  PROCALCITONIN  BASIC METABOLIC PANEL  CBC  LACTIC ACID, PLASMA  LACTIC ACID, PLASMA  TROPONIN I (HIGH SENSITIVITY)  TROPONIN I (HIGH SENSITIVITY)     EKG  Interpreted by me at 1640 heart rate 80 QRS 70 QTc 430 Atrial fibrillation, mild nonspecific T wave abnormality.  No evidence of frank or obvious  ischemia   RADIOLOGY  Chest x-ray is personally interpreted by me is concerning for right lower lobe pneumonia and infiltrate  DG Chest Portable 1 View  Result Date: 10/25/2022 CLINICAL DATA:  Shortness of breath EXAM: PORTABLE CHEST 1 VIEW COMPARISON:  03/07/2013 FINDINGS: Multifocal patchy opacities in the lungs bilaterally, right lower lobe predominant, suspicious for pneumonia. No pleural effusion or pneumothorax. The heart is normal in size.  Thoracic aortic atherosclerosis. IMPRESSION: Multifocal pneumonia, right lower lobe predominant. Electronically Signed   By: Julian Hy M.D.   On: 10/25/2022 17:04      PROCEDURES:  Critical Care performed: Yes, see critical care procedure note(s)  Patient with evidence of sepsis.  Right lower lobe pneumonia with leukocytosis, tachypnea, meets criteria for sepsis.  Antibiotic therapy including ceftriaxone and azithromycin initiated along with blood cultures.  Await lactic acid.  Procedures   MEDICATIONS ORDERED IN ED: Medications  azithromycin (ZITHROMAX) 500 mg in sodium chloride 0.9 % 250 mL IVPB (has no administration in time range)  acetaminophen (TYLENOL) tablet 650 mg (has no administration in time range)    Or  acetaminophen (TYLENOL) suppository 650 mg (has no administration in time range)  ondansetron (ZOFRAN) tablet 4 mg (has no administration in time range)    Or  ondansetron (ZOFRAN) injection 4 mg (has no administration in time range)  senna-docusate (Senokot-S) tablet 1 tablet (has no administration in time range)  azithromycin (ZITHROMAX) 500 mg in sodium chloride 0.9 % 250 mL IVPB (has no administration in time range)  cefTRIAXone (ROCEPHIN) 2 g in sodium chloride 0.9 % 100 mL IVPB (has no administration in time range)  lactated ringers bolus 500 mL (has no administration in time range)  enoxaparin (LOVENOX) injection 52.5 mg (has no administration in time range)  ipratropium-albuterol (DUONEB) 0.5-2.5 (3) MG/3ML  nebulizer solution 3 mL (has no administration in time range)  cefTRIAXone (ROCEPHIN) 2 g in sodium chloride 0.9 % 100 mL IVPB (0 g Intravenous Stopped 10/25/22 1913)  ipratropium-albuterol (DUONEB) 0.5-2.5 (3) MG/3ML nebulizer solution 3 mL (3 mLs Nebulization Given 10/25/22 1820)   CRITICAL CARE Performed by: Delman Kitten   Total critical care time: 30 minutes  Critical care time was exclusive of separately billable procedures and treating other patients.  Critical care was necessary to treat or prevent imminent or life-threatening deterioration.  Critical care was time spent personally by me on the following activities:  development of treatment plan with patient and/or surrogate as well as nursing, discussions with consultants, evaluation of patient's response to treatment, examination of patient, obtaining history from patient or surrogate, ordering and performing treatments and interventions, ordering and review of laboratory studies, ordering and review of radiographic studies, pulse oximetry and re-evaluation of patient's condition.   IMPRESSION / MDM / ASSESSMENT AND PLAN / ED COURSE  I reviewed the triage vital signs and the nursing notes.                              Differential diagnosis includes, but is not limited to, pneumonia, bronchitis, COVID, influenza, COPD this seems quite unlikely given wheezing is only noted in the right lower lobe, seems unlikely PE no pleuritic pain, patient is actively anticoagulated, does not appear to be volume overloaded and I doubt she is suffering acute CHF based on the clinical history and also exam and x-ray findings.  Patient's presentation is most consistent with acute presentation with potential threat to life or bodily function.  The patient is on the cardiac monitor to evaluate for evidence of arrhythmia and/or significant heart rate changes.    Patient denies any recent hospitalizations.  She does not appear to have significant risk  factors for healthcare associated pneumonia.  We will start azithromycin and Rocephin.  Patient and her family including both sisters at the bedside all understanding agreeable with plan for admission  Consulted with our hospitalist, patient excepted the service of Dr. Tobie Poet  FINAL CLINICAL IMPRESSION(S) / ED DIAGNOSES   Final diagnoses:  Community acquired pneumonia of right lower lobe of lung  Sepsis, due to unspecified organism, unspecified whether acute organ dysfunction present [facility])     Rx / DC Orders   ED Discharge Orders     None        Note:  This document was prepared using Dragon voice recognition software and may include unintentional dictation errors.   Delman Kitten, MD 10/25/22 [phone]

## 2022-10-25 NOTE — Assessment & Plan Note (Signed)
-   Escitalopram 10 mg daily resumed - Trazodone 50 mg daily resume

## 2022-10-25 NOTE — Assessment & Plan Note (Signed)
-   Amlodipine 2.5 mg daily, hydrochlorothiazide 25 mg daily, losartan 100 mg daily, metoprolol succinate 200 mg daily resumed - Hydralazine 5 mg IV every 6 hours as needed for SBP greater than 180, 4 days ordered

## 2022-10-25 NOTE — ED Notes (Signed)
Dr. Cox at bedside.  

## 2022-10-25 NOTE — Assessment & Plan Note (Signed)
Multilobar pneumonia specifically the right lower lobe - Continue with azithromycin and ceftriaxone 2 g IV - Blood cultures x2 have been ordered and pending collection - Check procalcitonin - Admit to telemetry medical, inpatient

## 2022-10-25 NOTE — Assessment & Plan Note (Addendum)
-   Patient states she is no longer on chemotherapy and she is status postmastectomy - Continue outpatient follow-up

## 2022-10-25 NOTE — Assessment & Plan Note (Signed)
-   Warfarin per pharmacy ordered - Metoprolol succinate 200 mg daily resumed

## 2022-10-25 NOTE — Hospital Course (Addendum)
Erin Romero is a 71 year old female with history of right breast cancer status post radical mastectomy, stage IIb, osteoporosis, hypertension, who presents emergency department for chief concerns of shortness of breath for 2 days.  Initial vitals in the emergency department showed temperature 100.4, respiration rate of 25, heart rate of 80, blood pressure 158/98, SPO2 of 91% on room air.  Serum sodium is 138, potassium 3.8, chloride 106, bicarb 25, BUN of 15, serum creatinine 0.67, nonfasting blood glucose 127, WBC 14.2, hemoglobin 12.9, platelets of 236.  BNP is 279.2.  High sensitive troponin was 13.  COVID/influenza A/influenza B PCR are pending.  ED treatment: DuoNebs one-time dose, azithromycin, ceftriaxone 2 g IV.

## 2022-10-25 NOTE — Assessment & Plan Note (Signed)
-   escitalopram resumed

## 2022-10-26 DIAGNOSIS — I4811 Longstanding persistent atrial fibrillation: Secondary | ICD-10-CM | POA: Diagnosis not present

## 2022-10-26 DIAGNOSIS — I10 Essential (primary) hypertension: Secondary | ICD-10-CM | POA: Diagnosis not present

## 2022-10-26 DIAGNOSIS — J189 Pneumonia, unspecified organism: Secondary | ICD-10-CM | POA: Diagnosis not present

## 2022-10-26 DIAGNOSIS — A419 Sepsis, unspecified organism: Secondary | ICD-10-CM | POA: Diagnosis not present

## 2022-10-26 LAB — BASIC METABOLIC PANEL WITH GFR
Anion gap: 6 (ref 5–15)
BUN: 14 mg/dL (ref 8–23)
CO2: 26 mmol/L (ref 22–32)
Calcium: 8.1 mg/dL — ABNORMAL LOW (ref 8.9–10.3)
Chloride: 110 mmol/L (ref 98–111)
Creatinine, Ser: 0.63 mg/dL (ref 0.44–1.00)
GFR, Estimated: >60 mL/min (ref >60)
Glucose, Bld: 139 mg/dL — ABNORMAL HIGH (ref 70–99)
Potassium: 3.4 mmol/L — ABNORMAL LOW (ref 3.5–5.1)
Sodium: 142 mmol/L (ref 135–145)

## 2022-10-26 LAB — CBC
HCT: 33.7 % — ABNORMAL LOW (ref 36.0–46.0)
Hemoglobin: 11.4 g/dL — ABNORMAL LOW (ref 12.0–15.0)
MCH: 30.4 pg (ref 26.0–34.0)
MCHC: 33.8 g/dL (ref 30.0–36.0)
MCV: 89.9 fL (ref 80.0–100.0)
Platelets: 190 K/uL (ref 150–400)
RBC: 3.75 MIL/uL — ABNORMAL LOW (ref 3.87–5.11)
RDW: 15.1 % (ref 11.5–15.5)
WBC: 11.1 K/uL — ABNORMAL HIGH (ref 4.0–10.5)
nRBC: 0 % (ref 0.0–0.2)

## 2022-10-26 LAB — PROTIME-INR
INR: 2.7 — ABNORMAL HIGH (ref 0.8–1.2)
Prothrombin Time: 28.5 seconds — ABNORMAL HIGH (ref 11.4–15.2)

## 2022-10-26 LAB — LACTIC ACID, PLASMA: Lactic Acid, Venous: 1.2 mmol/L (ref 0.5–1.9)

## 2022-10-26 MED ORDER — FUROSEMIDE 10 MG/ML IJ SOLN
40.0000 mg | Freq: Once | INTRAMUSCULAR | Status: AC
  Administered 2022-10-26: 40 mg via INTRAVENOUS
  Filled 2022-10-26: qty 4

## 2022-10-26 MED ORDER — FUROSEMIDE 10 MG/ML IJ SOLN: 20.0000 mg | Freq: Once | INTRAMUSCULAR | Status: DC

## 2022-10-26 MED ORDER — SODIUM CHLORIDE 0.9 % IV SOLN: INTRAVENOUS | Status: DC | PRN

## 2022-10-26 MED ORDER — HYDRALAZINE HCL 20 MG/ML IJ SOLN
10.0000 mg | Freq: Four times a day (QID) | INTRAMUSCULAR | Status: DC | PRN
  Administered 2022-10-26 – 2022-10-29 (×2): 10 mg via INTRAVENOUS
  Filled 2022-10-26 (×2): qty 1

## 2022-10-26 MED ORDER — POTASSIUM CHLORIDE CRYS ER 20 MEQ PO TBCR
40.0000 meq | EXTENDED_RELEASE_TABLET | Freq: Once | ORAL | Status: AC
  Administered 2022-10-26: 40 meq via ORAL
  Filled 2022-10-26: qty 2

## 2022-10-26 NOTE — Progress Notes (Addendum)
[facility]                                                                              Erin Romero, is a 71 y.o. female, DOB - [birth year], DPO:[id number] Admit date - 10/25/2022    Outpatient Primary MD for the patient is [name], FNP  LOS - 1  days  Chief Complaint  Patient presents with   Shortness of Breath       Brief summary   Patient is a 71 year old female with history of CA status post radical mastectomy, CKD 2B, osteoporosis, HTN, presented with shortness of breath for 2 days.  She also reported diarrhea, no cough, chest pain.  Patient also reported exertional dyspnea that has been worsening for some time, no peripheral edema, orthopnea or PND. In ED, temp 100.4 F, respiratory 25, HR 80, BP 158/98, O2 sats 91% on room air WBCs 14.2, hemoglobin 12.9, creatinine 0.6 Patient was given IV fluid bolus. Influenza panel, COVID-19 negative. Lactic acid 2.0-> 2.1-> 1.2 BNP elevated 279.2, troponin 16 Chest x-ray showed multifocal pneumonia right worse than left  Assessment & Plan    Principal Problem:   Sepsis due to multifocal pneumonia, POA -Patient met sepsis criteria due to fevers, mild tachypnea, lactic acidosis, source likely due to multifocal pneumonia -Blood cultures, continue IV Zithromax, Rocephin -BNP elevated in ED, patient also reported DOE although no orthopnea PND or peripheral edema - will give Lasix 40 mg IV x1, follow 2D echo  Active Problems:  Atrial fibrillation -Rate controlled, continue metoprolol -Continue warfarin per pharmacy    Anxiety, depression -Continue escitalopram    Hypercholesteremia -Continue atorvastatin 10 mg daily    Essential hypertension -BP somewhat elevated, continue amlodipine, metoprolol succinate 200 mg daily -Ordered Lasix 40 mg IV x1 given patient has been complaining of dyspnea on exertion     Insomnia -Continue trazodone nightly    Ductal carcinoma in situ (DCIS) of left breast -  Patient reported that she is no longer on chemotherapy and status post mastectomy -Continue outpatient follow-up  Obesity Estimated body mass index is 42.12 kg/m as calculated from the following:   Height as of this encounter: _0  (1.626 m).   Weight as of this encounter: 111.3 kg.  Code Status: Full code DVT Prophylaxis:  Place TED hose Start: 10/25/22 1828 warfarin (COUMADIN) tablet 1.5 mg  warfarin (COUMADIN) tablet 2 mg   Level of Care: Level of care: Telemetry Medical Family Communication: Updated patient, alert and oriented   Disposition Plan:      Remains inpatient appropriate: Hopefully DC home tomorrow if continues to improve   Procedures:    Consultants:   None  Antimicrobials:   Anti-infectives (From admission, onward)    Start     Dose/Rate Route Frequency Ordered Stop   10/26/22 1000  azithromycin (ZITHROMAX) 500 mg in sodium chloride 0.9 % 250 mL IVPB        500 mg 250 mL/hr over 60 Minutes Intravenous Every 24 hours 10/25/22 1833 10/30/22 0959   10/26/22 1000  cefTRIAXone (ROCEPHIN) 2 g in sodium chloride 0.9 % 100 mL IVPB  2 g 200 mL/hr over 30 Minutes Intravenous Every 24 hours 10/25/22 1833 10/30/22 0959   10/25/22 1715  cefTRIAXone (ROCEPHIN) 2 g in sodium chloride 0.9 % 100 mL IVPB        2 g 200 mL/hr over 30 Minutes Intravenous  Once 10/25/22 1710 10/25/22 1913   10/25/22 1715  azithromycin (ZITHROMAX) 500 mg in sodium chloride 0.9 % 250 mL IVPB        500 mg 250 mL/hr over 60 Minutes Intravenous  Once 10/25/22 1710 10/25/22 2331          Medications  amLODipine  2.5 mg Oral Daily   atorvastatin  10 mg Oral Daily   escitalopram  10 mg Oral Daily   metoprolol succinate  200 mg Oral Daily   pantoprazole  40 mg Oral Daily   traZODone  50 mg Oral Daily   [START ON 10/28/2022] warfarin  2 mg Oral Once per day on Tue Thu   And   warfarin  1.5 mg Oral Once per day on Sun Mon Wed Fri Sat   Warfarin - Pharmacist Dosing Inpatient   Does  not apply q1600      Subjective:   Erin Romero was seen and examined today.  Still having some shortness of breath, no chest pain, fevers or chills.  States she has been having shortness of breath with exertion for the few months although no orthopnea, PND or significant peripheral edema.  Objective:   Vitals:   10/26/22 0028 10/26/22 0702 10/26/22 0705 10/26/22 0727  BP: (!) 143/75 (!) 162/68 (!) 159/78   Pulse: 74 78 80   Resp: 16 16    Temp: 98.2 F (36.8 C) 98.6 F (37 C)    TempSrc:  Oral    SpO2: 94% 97% 98% 93%  Weight:      Height:        Intake/Output Summary (Last 24 hours) at 10/26/2022 1136 Last data filed at 10/26/2022 1036 Gross per 24 hour  Intake --  Output 900 ml  Net -900 ml     Wt Readings from Last 3 Encounters:  10/25/22 111.3 kg  09/10/22 106.1 kg  06/11/22 106.5 kg     Exam General: Alert and oriented x 3, NAD Cardiovascular: S1 S2 auscultated,  RRR Respiratory: Diminished breath sounds at the bases Gastrointestinal: Soft, nontender, nondistended, + bowel sounds Ext: no pedal edema bilaterally Neuro: Strength 5/5 upper and lower extremities bilaterally Psych: Normal affect and demeanor, alert and oriented x3     Data Reviewed:  I have personally reviewed following labs    CBC Lab Results  Component Value Date   WBC 11.1 (H) 10/26/2022   RBC 3.75 (L) 10/26/2022   HGB 11.4 (L) 10/26/2022   HCT 33.7 (L) 10/26/2022   MCV 89.9 10/26/2022   MCH 30.4 10/26/2022   PLT 190 10/26/2022   MCHC 33.8 10/26/2022   RDW 15.1 10/26/2022   LYMPHSABS 2.3 06/09/2022   MONOABS 1.0 06/09/2022   EOSABS 0.2 06/09/2022   BASOSABS 0.1 59/93/5701     Last metabolic panel Lab Results  Component Value Date   NA 142 10/26/2022   K 3.4 (L) 10/26/2022   CL 110 10/26/2022   CO2 26 10/26/2022   BUN 14 10/26/2022   CREATININE 0.63 10/26/2022   GLUCOSE 139 (H) 10/26/2022   GFRNONAA >60 10/26/2022   GFRAA 75 08/10/2020   CALCIUM 8.1 (L)  10/26/2022   PROT 7.9 10/25/2022   ALBUMIN 3.5 10/25/2022  LABGLOB 3.0 02/27/2022   AGRATIO 1.3 02/27/2022   BILITOT 1.5 (H) 10/25/2022   ALKPHOS 72 10/25/2022   AST 21 10/25/2022   ALT 12 10/25/2022   ANIONGAP 6 10/26/2022    CBG (last 3)  No results for input(s): "GLUCAP" in the last 72 hours.    Coagulation Profile: Recent Labs  Lab 10/25/22 1635 10/26/22 0537  INR 2.6* 2.7*     Radiology Studies: I have personally reviewed the imaging studies  DG Chest Portable 1 View  Result Date: 10/25/2022 CLINICAL DATA:  Shortness of breath EXAM: PORTABLE CHEST 1 VIEW COMPARISON:  03/07/2013 FINDINGS: Multifocal patchy opacities in the lungs bilaterally, right lower lobe predominant, suspicious for pneumonia. No pleural effusion or pneumothorax. The heart is normal in size.  Thoracic aortic atherosclerosis. IMPRESSION: Multifocal pneumonia, right lower lobe predominant. Electronically Signed   By: Julian Hy M.D.   On: 10/25/2022 17:04       Ramiyah Mcclenahan M.D. [facility] 10/26/2022, 11:36 AM  Available via Epic secure chat 7am-7pm After 7 pm, please refer to night coverage provider listed on amion.

## 2022-10-26 NOTE — Plan of Care (Signed)
Patient was up to the bsc today several times. She is sob with exertion. She is on 2L O2 per [place] with O2 sats in the mid 90's.  Patient had several loose stools during the shift. Nursing unable to collect stool specimen during the shift due to stool mixed with urine .

## 2022-10-26 NOTE — Consult Note (Signed)
ANTICOAGULATION CONSULT NOTE - Consult  Pharmacy Consult for warfarin Indication: atrial fibrillation  Allergies  Allergen Reactions   Docetaxel Itching and Rash    Other reaction(s): edema   Sulfa Antibiotics Hives    Patient Measurements: Height: '5\' 4"'$  (162.6 cm) Weight: 111.3 kg (245 lb 6 oz) IBW/kg (Calculated) : 54.7  Vital Signs: Temp: 98.6 F (37 C) (11/05 0702) Temp Source: Oral (11/05 0702) BP: 159/78 (11/05 0705) Pulse Rate: 80 (11/05 0705)  Labs: Recent Labs    10/25/22 1635 10/25/22 1641 10/25/22 2119 10/26/22 0537  HGB 12.9  --   --  11.4*  HCT 38.6  --   --  33.7*  PLT 236  --   --  190  LABPROT 27.6*  --   --  28.5*  INR 2.6*  --   --  2.7*  CREATININE  --  0.67  --  0.63  TROPONINIHS 13  --  16  --      Estimated Creatinine Clearance: 77.6 mL/min (by C-G formula based on SCr of 0.63 mg/dL).   Medications:  PTA: Takes '2mg'$  on Tue/Thurs & 1.'5mg'$  on all other days (Last dose 11/3 1.'5mg'$  dose) Inpatient: order for [facility] (no doses given) >> resumed home warfarin  Assessment: 71yo F w/ h/o breast cancer, IDA, HTN, and atrial fibrillation (on VKA).   Date Time INR Dose/Comment 11/3 PTA Unk 1.'5mg'$  11/4 1635 2.6 1.'5mg'$ ; therapeutic 11/5 0537 2.7        Goal of Therapy:  INR 2-3 Monitor platelets by anticoagulation protocol: Yes   Plan:  INR=2.7  therapeutic.  DDI: Pt currently on azithromycin, watch closely for potential of adjustments. Continue warfarin as per home dosing plan currently.  '2mg'$  on Tue/Thurs & 1.'5mg'$  on all other days. CTM INR daily and survey for new DDI's  Diandre Merica A 10/26/2022,9:57 AM

## 2022-10-27 ENCOUNTER — Inpatient Hospital Stay
Admit: 2022-10-27 | Discharge: 2022-10-27 | Disposition: A | Discharge status: Home / Self Care | Payer: Medicare HMO | Attending: Internal Medicine | Admitting: Internal Medicine

## 2022-10-27 ENCOUNTER — Inpatient Hospital Stay: Payer: Medicare HMO

## 2022-10-27 DIAGNOSIS — I10 Essential (primary) hypertension: Secondary | ICD-10-CM | POA: Diagnosis not present

## 2022-10-27 DIAGNOSIS — J189 Pneumonia, unspecified organism: Secondary | ICD-10-CM | POA: Diagnosis not present

## 2022-10-27 DIAGNOSIS — A419 Sepsis, unspecified organism: Secondary | ICD-10-CM | POA: Diagnosis not present

## 2022-10-27 DIAGNOSIS — I4811 Longstanding persistent atrial fibrillation: Secondary | ICD-10-CM | POA: Diagnosis not present

## 2022-10-27 LAB — GASTROINTESTINAL PANEL BY PCR, STOOL (REPLACES STOOL CULTURE)

## 2022-10-27 LAB — C DIFFICILE QUICK SCREEN W PCR REFLEX
C Diff antigen: NEGATIVE
C Diff interpretation: NOT DETECTED
C Diff toxin: NEGATIVE

## 2022-10-27 LAB — BASIC METABOLIC PANEL
Anion gap: 6 (ref 5–15)
BUN: 14 mg/dL (ref 8–23)
CO2: 27 mmol/L (ref 22–32)
Calcium: 8.1 mg/dL — ABNORMAL LOW (ref 8.9–10.3)
Chloride: 109 mmol/L (ref 98–111)
Creatinine, Ser: 0.57 mg/dL (ref 0.44–1.00)
GFR, Estimated: >60 mL/min (ref >60)
Glucose, Bld: 129 mg/dL — ABNORMAL HIGH (ref 70–99)
Potassium: 3.6 mmol/L (ref 3.5–5.1)
Sodium: 142 mmol/L (ref 135–145)

## 2022-10-27 LAB — CBC
HCT: 33.9 % — ABNORMAL LOW (ref 36.0–46.0)
Hemoglobin: 11.4 g/dL — ABNORMAL LOW (ref 12.0–15.0)
MCH: 30.1 pg (ref 26.0–34.0)
MCHC: 33.6 g/dL (ref 30.0–36.0)
MCV: 89.4 fL (ref 80.0–100.0)
Platelets: 208 10*3/uL (ref 150–400)
RBC: 3.79 MIL/uL — ABNORMAL LOW (ref 3.87–5.11)
RDW: 15 % (ref 11.5–15.5)
WBC: 9.8 10*3/uL (ref 4.0–10.5)
nRBC: 0 % (ref 0.0–0.2)

## 2022-10-27 LAB — PROTIME-INR
INR: 2.8 — ABNORMAL HIGH (ref 0.8–1.2)
Prothrombin Time: 29.3 seconds — ABNORMAL HIGH (ref 11.4–15.2)

## 2022-10-27 LAB — PROCALCITONIN: Procalcitonin: <0.1 ng/mL

## 2022-10-27 LAB — D-DIMER, QUANTITATIVE: D-Dimer, Quant: 0.31 ug/mL-FEU (ref 0.00–0.50)

## 2022-10-27 MED ORDER — FUROSEMIDE 10 MG/ML IJ SOLN
40.0000 mg | Freq: Every day | INTRAMUSCULAR | Status: DC
  Administered 2022-10-27 – 2022-10-29 (×3): 40 mg via INTRAVENOUS
  Filled 2022-10-27 (×3): qty 4

## 2022-10-27 MED ORDER — AZITHROMYCIN 500 MG PO TABS
500.0000 mg | ORAL_TABLET | Freq: Every day | ORAL | Status: AC
  Administered 2022-10-27 – 2022-10-29 (×3): 500 mg via ORAL
  Filled 2022-10-27 (×3): qty 1

## 2022-10-27 MED ORDER — POTASSIUM CHLORIDE CRYS ER 20 MEQ PO TBCR
40.0000 meq | EXTENDED_RELEASE_TABLET | Freq: Once | ORAL | Status: AC
  Administered 2022-10-27: 40 meq via ORAL
  Filled 2022-10-27: qty 2

## 2022-10-27 NOTE — Progress Notes (Signed)
[facility]                                                                              Lurene Robley, is a 71 y.o. female, DOB - [birth year], FMB:[id number] Admit date - 10/25/2022    Outpatient Primary MD for the patient is [name], FNP  LOS - 2  days  Chief Complaint  Patient presents with   Shortness of Breath       Brief summary   Patient is a 71 year old female with history of CA status post radical mastectomy, CKD 2B, osteoporosis, HTN, presented with shortness of breath for 2 days.  She also reported diarrhea, no cough, chest pain.  Patient also reported exertional dyspnea that has been worsening for some time, no peripheral edema, orthopnea or PND. In ED, temp 100.4 F, respiratory 25, HR 80, BP 158/98, O2 sats 91% on room air WBCs 14.2, hemoglobin 12.9, creatinine 0.6 Patient was given IV fluid bolus. Influenza panel, COVID-19 negative. Lactic acid 2.0-> 2.1-> 1.2 BNP elevated 279.2, troponin 16 Chest x-ray showed multifocal pneumonia right worse than left  Assessment & Plan    Principal Problem:   Sepsis due to multifocal pneumonia, POA -Patient met sepsis criteria due to fevers, mild tachypnea, lactic acidosis, source likely due to multifocal pneumonia -Blood cultures, continue IV Zithromax, Rocephin -BNP elevated in ED, patient also reported DOE although no orthopnea PND or peripheral edema, felt somewhat better after Lasix 40 mg IV x1 -Still complaining of shortness of breath and DOE with exertion, started on Lasix 40 mg IV daily, strict I's and O's and daily weights, follow 2D echo -Repeat chest x-ray today showed improved pulmonary infiltrates with noted small left pleural effusion mild left basilar atelectasis  Active Problems:  Atrial fibrillation -HR controlled, continue metoprolol -Continue warfarin per pharmacy    Anxiety, depression -Continue escitalopram    Hypercholesteremia -Continue atorvastatin 10 mg daily     Essential hypertension -Continue amlodipine, metoprolol, 40 mg IV daily  -Continue hydralazine as needed with parameters      Insomnia -Continue trazodone nightly    Ductal carcinoma in situ (DCIS) of left breast - Patient reported that she is no longer on chemotherapy and status post mastectomy -Continue outpatient follow-up  Obesity Estimated body mass index is 42.16 kg/m as calculated from the following:   Height as of this encounter: _0  (1.626 m).   Weight as of this encounter: 111.4 kg.  Code Status: Full code DVT Prophylaxis:  Place TED hose Start: 10/25/22 1828 warfarin (COUMADIN) tablet 1.5 mg  warfarin (COUMADIN) tablet 2 mg   Level of Care: Level of care: Telemetry Medical Family Communication: Updated patient, alert and oriented   Disposition Plan:      Remains inpatient appropriate: Hopefully DC home tomorrow if continues to improve   Procedures:    Consultants:   None  Antimicrobials:   Anti-infectives (From admission, onward)    Start     Dose/Rate Route Frequency Ordered Stop   10/27/22 0759  azithromycin (ZITHROMAX) tablet 500 mg        500 mg Oral Daily 10/27/22 0800 10/30/22 0959  10/26/22 1000  azithromycin (ZITHROMAX) 500 mg in sodium chloride 0.9 % 250 mL IVPB  Status:  Discontinued        500 mg 250 mL/hr over 60 Minutes Intravenous Every 24 hours 10/25/22 1833 10/27/22 0800   10/26/22 1000  cefTRIAXone (ROCEPHIN) 2 g in sodium chloride 0.9 % 100 mL IVPB        2 g 200 mL/hr over 30 Minutes Intravenous Every 24 hours 10/25/22 1833 10/30/22 0959   10/25/22 1715  cefTRIAXone (ROCEPHIN) 2 g in sodium chloride 0.9 % 100 mL IVPB        2 g 200 mL/hr over 30 Minutes Intravenous  Once 10/25/22 1710 10/25/22 1913   10/25/22 1715  azithromycin (ZITHROMAX) 500 mg in sodium chloride 0.9 % 250 mL IVPB        500 mg 250 mL/hr over 60 Minutes Intravenous  Once 10/25/22 1710 10/25/22 2331          Medications  amLODipine  2.5 mg Oral Daily    atorvastatin  10 mg Oral Daily   azithromycin  500 mg Oral Daily   escitalopram  10 mg Oral Daily   furosemide  40 mg Intravenous Daily   metoprolol succinate  200 mg Oral Daily   pantoprazole  40 mg Oral Daily   traZODone  50 mg Oral Daily   [START ON 10/28/2022] warfarin  2 mg Oral Once per day on Tue Thu   And   warfarin  1.5 mg Oral Once per day on Sun Mon Wed Fri Sat   Warfarin - Pharmacist Dosing Inpatient   Does not apply q1600      Subjective:   Amalee Olsen was seen and examined today.  Still feeling short of breath with exertion.  No acute nausea vomiting, chest pain or fevers.  States overnight had loose watery bowel movement. Objective:   Vitals:   10/27/22 0800 10/27/22 0822 10/27/22 1000 10/27/22 1216  BP: (!) 162/70   (!) 157/74  Pulse: 87   75  Resp: 18     Temp: (!) 97.4 F (36.3 C)   98.3 F (36.8 C)  TempSrc: Oral   Oral  SpO2: 100% 96% 97% 94%  Weight:      Height:        Intake/Output Summary (Last 24 hours) at 10/27/2022 1258 Last data filed at 10/27/2022 1000 Gross per 24 hour  Intake 258.33 ml  Output 551 ml  Net -292.67 ml     Wt Readings from Last 3 Encounters:  10/27/22 111.4 kg  09/10/22 106.1 kg  06/11/22 106.5 kg    Physical Exam General: Alert and oriented x 3, NAD Cardiovascular: S1 S2 clear, RRR.  Respiratory: Diminished breath sound at the bases, no wheezing Gastrointestinal: Soft, nontender, nondistended, NBS Ext: no pedal edema bilaterally Neuro: no new deficits Skin: No rashes Psych: Normal affect     Data Reviewed:  I have personally reviewed following labs    CBC Lab Results  Component Value Date   WBC 9.8 10/27/2022   RBC 3.79 (L) 10/27/2022   HGB 11.4 (L) 10/27/2022   HCT 33.9 (L) 10/27/2022   MCV 89.4 10/27/2022   MCH 30.1 10/27/2022   PLT 208 10/27/2022   MCHC 33.6 10/27/2022   RDW 15.0 10/27/2022   LYMPHSABS 2.3 06/09/2022   MONOABS 1.0 06/09/2022   EOSABS 0.2 06/09/2022   BASOSABS 0.1 06/09/2022      Last metabolic panel Lab Results  Component Value Date   NA  142 10/27/2022   K 3.6 10/27/2022   CL 109 10/27/2022   CO2 27 10/27/2022   BUN 14 10/27/2022   CREATININE 0.57 10/27/2022   GLUCOSE 129 (H) 10/27/2022   GFRNONAA >60 10/27/2022   GFRAA 75 08/10/2020   CALCIUM 8.1 (L) 10/27/2022   PROT 7.9 10/25/2022   ALBUMIN 3.5 10/25/2022   LABGLOB 3.0 02/27/2022   AGRATIO 1.3 02/27/2022   BILITOT 1.5 (H) 10/25/2022   ALKPHOS 72 10/25/2022   AST 21 10/25/2022   ALT 12 10/25/2022   ANIONGAP 6 10/27/2022    CBG (last 3)  No results for input(s): "GLUCAP" in the last 72 hours.    Coagulation Profile: Recent Labs  Lab 10/25/22 1635 10/26/22 0537 10/27/22 0531  INR 2.6* 2.7* 2.8*     Radiology Studies: I have personally reviewed the imaging studies  DG Chest 2 View  Result Date: 10/27/2022 CLINICAL DATA:  Dyspnea, shortness of breath, pneumonia EXAM: CHEST - 2 VIEW COMPARISON:  10/25/2022 FINDINGS: Enlargement of cardiac silhouette. Atherosclerotic calcification aorta. Scattered pulmonary infiltrates, improved since previous exam. Small LEFT pleural effusion and mild LEFT basilar atelectasis. No pneumothorax. Bones demineralized. IMPRESSION: Improved pulmonary infiltrates with noted small LEFT pleural effusion and mild LEFT basilar atelectasis. Enlargement of cardiac silhouette. Aortic Atherosclerosis (ICD10-I70.0). Electronically Signed   By: Lavonia Dana M.D.   On: 10/27/2022 10:41   DG Chest Portable 1 View  Result Date: 10/25/2022 CLINICAL DATA:  Shortness of breath EXAM: PORTABLE CHEST 1 VIEW COMPARISON:  03/07/2013 FINDINGS: Multifocal patchy opacities in the lungs bilaterally, right lower lobe predominant, suspicious for pneumonia. No pleural effusion or pneumothorax. The heart is normal in size.  Thoracic aortic atherosclerosis. IMPRESSION: Multifocal pneumonia, right lower lobe predominant. Electronically Signed   By: Julian Hy M.D.   On: 10/25/2022 17:04        Tieler Cournoyer M.D. [facility] 10/27/2022, 12:58 PM  Available via Epic secure chat 7am-7pm After 7 pm, please refer to night coverage provider listed on amion.

## 2022-10-27 NOTE — Progress Notes (Signed)
*  PRELIMINARY RESULTS* Echocardiogram 2D Echocardiogram has been performed.  Erin Romero 10/27/2022, 12:11 PM

## 2022-10-27 NOTE — Plan of Care (Signed)

## 2022-10-28 DIAGNOSIS — I10 Essential (primary) hypertension: Secondary | ICD-10-CM | POA: Diagnosis not present

## 2022-10-28 DIAGNOSIS — J189 Pneumonia, unspecified organism: Secondary | ICD-10-CM | POA: Diagnosis not present

## 2022-10-28 DIAGNOSIS — I4811 Longstanding persistent atrial fibrillation: Secondary | ICD-10-CM | POA: Diagnosis not present

## 2022-10-28 DIAGNOSIS — A419 Sepsis, unspecified organism: Secondary | ICD-10-CM | POA: Diagnosis not present

## 2022-10-28 DIAGNOSIS — I5031 Acute diastolic (congestive) heart failure: Secondary | ICD-10-CM

## 2022-10-28 LAB — ECHOCARDIOGRAM COMPLETE
AR max vel: 1.18 cm2
AV Area VTI: 1.11 cm2
AV Area mean vel: 1.07 cm2
AV Mean grad: 5 mmHg
AV Peak grad: 7.5 mmHg
Ao pk vel: 1.37 m/s
Area-P 1/2: 3.42 cm2
Height: 64 in
S' Lateral: 2.9 cm
Weight: 3929.48 oz

## 2022-10-28 LAB — PROTIME-INR
INR: 2.7 — ABNORMAL HIGH (ref 0.8–1.2)
Prothrombin Time: 28.5 s — ABNORMAL HIGH (ref 11.4–15.2)

## 2022-10-28 LAB — BASIC METABOLIC PANEL
Anion gap: 8 (ref 5–15)
BUN: 16 mg/dL (ref 8–23)
CO2: 27 mmol/L (ref 22–32)
Calcium: 8.5 mg/dL — ABNORMAL LOW (ref 8.9–10.3)
Chloride: 109 mmol/L (ref 98–111)
Creatinine, Ser: 0.66 mg/dL (ref 0.44–1.00)
GFR, Estimated: >60 mL/min (ref >60)
Glucose, Bld: 116 mg/dL — ABNORMAL HIGH (ref 70–99)
Potassium: 3.3 mmol/L — ABNORMAL LOW (ref 3.5–5.1)
Sodium: 144 mmol/L (ref 135–145)

## 2022-10-28 MED ORDER — LOPERAMIDE HCL 2 MG PO CAPS
4.0000 mg | ORAL_CAPSULE | Freq: Once | ORAL | Status: AC
  Administered 2022-10-28: 4 mg via ORAL
  Filled 2022-10-28: qty 2

## 2022-10-28 MED ORDER — LOPERAMIDE HCL 2 MG PO CAPS: 2.0000 mg | ORAL_CAPSULE | Freq: Three times a day (TID) | ORAL | Status: DC | PRN

## 2022-10-28 NOTE — Consult Note (Signed)
ANTICOAGULATION CONSULT NOTE - Consult  Pharmacy Consult for warfarin Indication: atrial fibrillation  Allergies  Allergen Reactions   Docetaxel Itching and Rash    Other reaction(s): edema   Sulfa Antibiotics Hives    Patient Measurements: Height: '5\' 4"'$  (162.6 cm) Weight: 108.4 kg (238 lb 15.7 oz) IBW/kg (Calculated) : 54.7  Vital Signs: Temp: 98.4 F (36.9 C) (11/07 0514) BP: 164/74 (11/07 0514) Pulse Rate: 72 (11/07 0514)  Labs: Recent Labs    10/25/22 1635 10/25/22 1641 10/25/22 2119 10/26/22 0537 10/27/22 0531 10/28/22 0456  HGB 12.9  --   --  11.4* 11.4*  --   HCT 38.6  --   --  33.7* 33.9*  --   PLT 236  --   --  190 208  --   LABPROT 27.6*  --   --  28.5* 29.3* 28.5*  INR 2.6*  --   --  2.7* 2.8* 2.7*  CREATININE  --    < >  --  0.63 0.57 0.66  TROPONINIHS 13  --  16  --   --   --    < > = values in this interval not displayed.     Estimated Creatinine Clearance: 76.5 mL/min (by C-G formula based on SCr of 0.66 mg/dL).   Medications:  PTA: Takes '2mg'$  on Tue/Thurs & 1.'5mg'$  on all other days (Last dose 11/3 1.'5mg'$  dose) Inpatient: order for [facility] (no doses given) >> resumed home warfarin  Assessment: 71yo F w/ h/o breast cancer, IDA, HTN, and atrial fibrillation (on VKA).   Date Time INR Dose/Comment 11/3 PTA Unk 1.'5mg'$  11/4 1635 2.6 1.'5mg'$ ; therapeutic 11/5 0537 2.7 1.5  11/6 0500 2.8 1.5 11/7 0500 2.7 2 mg        Goal of Therapy:  INR 2-3 Monitor platelets by anticoagulation protocol: Yes   Plan:  INR=2.7  therapeutic.  Continue warfarin as per home dosing plan currently.  '2mg'$  on Tue/Thurs & 1.'5mg'$  on all other days. CTM INR daily and survey for new DDI's  Dorothe Pea, PharmD, BCPS Clinical Pharmacist   10/28/2022,8:28 AM

## 2022-10-28 NOTE — Evaluation (Signed)
Physical Therapy Evaluation Patient Details Name: Erin Romero MRN: [id number] DOB: [birth year] Today's Date: 10/28/2022  History of Present Illness  71 year old female with history of right breast cancer status post b/l radical mastectomy, stage IIb, osteoporosis, hypertension, who presents emergency department for chief concerns of shortness of breath, admitted with PNA.  Clinical Impression  Pt did well with PT exam showing good safety and confidence with basic bed mobility, transfers and ability to circumambulate the nurses' station with no supplemental O2, pt did self select using a walker citing chronic R knee arthritic pain.  Pt's vitals appropriate t/o the ambulation effort with expected HR increased and mild O2 drop (staying in the 90s.)  Pt will benefit from continued PT here and after d/c.     Recommendations for follow up therapy are one component of a multi-disciplinary discharge planning process, led by the attending physician.  Recommendations may be updated based on patient status, additional functional criteria and insurance authorization.  Follow Up Recommendations Home health PT      Assistance Recommended at Discharge PRN (reports she has family that could run errands, etc if needed)  Patient can return home with the following       Equipment Recommendations None recommended by PT  Recommendations for Other Services       Functional Status Assessment Patient has had a recent decline in their functional status and demonstrates the ability to make significant improvements in function in a reasonable and predictable amount of time.     Precautions / Restrictions Precautions Precautions: None Restrictions Weight Bearing Restrictions: No      Mobility  Bed Mobility Overal bed mobility: Modified Independent             General bed mobility comments: sitting pre and post session    Transfers Overall transfer level: Modified independent Equipment  used: Rolling walker (2 wheels)               General transfer comment: Pt did not require much UE assist to rise or maintain balance - does prefer to ease R knee pain    Ambulation/Gait Ambulation/Gait assistance: Supervision Gait Distance (Feet): 250 Feet Assistive device: Rolling walker (2 wheels)         General Gait Details: Pt was able to maintain slow but consistent gait with appropriate walker use.  She was on room air t/o the effort and O2 remained in the mid/high 90s t/o the effort.  HR stays 70s-80s for first ~50 ft, does increase to 110s with increased distance, quickly back to 70-80s range at rest,  Stairs            Wheelchair Mobility    Modified Rankin (Stroke Patients Only)       Balance Overall balance assessment: Modified Independent                                           Pertinent Vitals/Pain Pain Assessment Pain Assessment: 0-10 Pain Score: 7  Pain Location: R knee, mod head ache    Home Living Family/patient expects to be discharged to:: Private residence Living Arrangements: Alone   Type of Home: House Home Access: Ramped entrance       Home Layout: One level Home Equipment: Conservation officer, nature (2 wheels);Cane - single point      Prior Function Prior Level of Function : Independent/Modified Independent  Mobility Comments: Pt reports she drives to [place], groceries, minimal errand running.  Has been using a walker more recently 2/2 R knee pain.       Hand Dominance        Extremity/Trunk Assessment   Upper Extremity Assessment Upper Extremity Assessment: Generalized weakness    Lower Extremity Assessment Lower Extremity Assessment: Generalized weakness       Communication   Communication: No difficulties  Cognition Arousal/Alertness: Awake/alert Behavior During Therapy: WFL for tasks assessed/performed Overall Cognitive Status: Within Functional Limits for tasks assessed                                           General Comments General comments (skin integrity, edema, etc.): Pt was able to do some ambulation w/o AD, however chronic R knee pain limited toleracnce and she self selected RW use    Exercises     Assessment/Plan    PT Assessment Patient needs continued PT services  PT Problem List Decreased activity tolerance;Decreased strength;Decreased mobility;Decreased safety awareness;Decreased knowledge of use of DME;Pain;Cardiopulmonary status limiting activity       PT Treatment Interventions DME instruction;Gait training;Functional mobility training;Therapeutic activities;Therapeutic exercise;Balance training;Patient/family education    PT Goals (Current goals can be found in the Care Plan section)  Acute Rehab PT Goals Patient Stated Goal: go home, get a knee replacement PT Goal Formulation: With patient Time For Goal Achievement: 11/10/22 Potential to Achieve Goals: Good    Frequency Min 2X/week     Co-evaluation               AM-PAC PT "6 Clicks" Mobility  Outcome Measure Help needed turning from your back to your side while in a flat bed without using bedrails?: None Help needed moving from lying on your back to sitting on the side of a flat bed without using bedrails?: None Help needed moving to and from a bed to a chair (including a wheelchair)?: None Help needed standing up from a chair using your arms (e.g., wheelchair or bedside chair)?: None Help needed to walk in hospital room?: A Little Help needed climbing 3-5 steps with a railing? : A Little 6 Click Score: 22    End of Session Equipment Utilized During Treatment: Gait belt Activity Tolerance: Patient tolerated treatment well Patient left: in chair;with call bell/phone within reach Nurse Communication: Mobility status PT Visit Diagnosis: Muscle weakness (generalized) (M62.81);Difficulty in walking, not elsewhere classified (R26.2)    Time: 6644-0347 PT Time  Calculation (min) (ACUTE ONLY): 28 min   Charges:   PT Evaluation $PT Eval Low Complexity: 1 Low PT Treatments $Gait Training: 8-22 mins        Kreg Shropshire, DPT 10/28/2022, 2:00 PM

## 2022-10-28 NOTE — Care Management Important Message (Signed)
Important Message  Patient Details  Name: Erin Romero MRN: [id number] Date of Birth: 1951/04/29   Medicare Important Message Given:  Yes     Erin Romero 10/28/2022, 11:35 AM

## 2022-10-28 NOTE — Progress Notes (Signed)
[facility]                                                                              Erin Romero, is a 71 y.o. female, DOB - 1951-01-18, RZN:[id number] Admit date - 10/25/2022    Outpatient Primary MD for the patient is [name], FNP  LOS - 3  days  Chief Complaint  Patient presents with   Shortness of Breath       Brief summary   Patient is a 71 year old female with history of CA status post radical mastectomy, CKD 2B, osteoporosis, HTN, presented with shortness of breath for 2 days.  She also reported diarrhea, no cough, chest pain.  Patient also reported exertional dyspnea that has been worsening for some time, no peripheral edema, orthopnea or PND. In ED, temp 100.4 F, respiratory 25, HR 80, BP 158/98, O2 sats 91% on room air WBCs 14.2, hemoglobin 12.9, creatinine 0.6 Patient was given IV fluid bolus. Influenza panel, COVID-19 negative. Lactic acid 2.0-> 2.1-> 1.2 BNP elevated 279.2, troponin 16 Chest x-ray showed multifocal pneumonia right worse than left  Assessment & Plan    Principal Problem:   Sepsis due to multifocal pneumonia, POA -Patient met sepsis criteria due to fevers, mild tachypnea, lactic acidosis, source likely due to multifocal pneumonia -Blood cultures negative till date, continue IV Rocephin, Zithromax  - CXR 11/6 showed improvement in pulmonary infiltrates, small left pleural effusion with mild left basilar atelectasis   Active Problems:  Acute diastolic CHF -BNP elevated in ED, patient had reported DOE, no orthopnea or PND, felt better after IV Lasix diuresis. -2D echo showed EF 55 to 60%, indeterminate diastolic filling -Dyspnea improving with IV Lasix, 40 mg daily -Strict I's and O's and daily weights.  Negative balance of 2.1 L, weight down from 245-> 238.9lbs   Atrial fibrillation -HR controlled, continue metoprolol -Continue warfarin per pharmacy    Anxiety, depression -Continue escitalopram     Hypercholesteremia -Continue atorvastatin 10 mg daily    Essential hypertension -Continue amlodipine, metoprolol, 40 mg IV daily  -Continue hydralazine as needed with parameters      Insomnia -Continue trazodone nightly  Diarrhea -GI pathogen panel negative, C. difficile negative -Start loperamide  Hypokalemia -Replaced    Ductal carcinoma in situ (DCIS) of left breast - Patient reported that she is no longer on chemotherapy and status post mastectomy -Continue outpatient follow-up  Obesity Estimated body mass index is 41.02 kg/m as calculated from the following:   Height as of this encounter: _0  (1.626 m).   Weight as of this encounter: 108.4 kg.  Code Status: Full code DVT Prophylaxis:  Place TED hose Start: 10/25/22 1828 warfarin (COUMADIN) tablet 1.5 mg  warfarin (COUMADIN) tablet 2 mg   Level of Care: Level of care: Telemetry Medical Family Communication: Updated patient, alert and oriented   Disposition Plan:      Remains inpatient appropriate: Continue IV Lasix today, if improving, will transition to oral Lasix and DC home in a.m.   Procedures:  2D echo  Consultants:   None  Antimicrobials:   Anti-infectives (From admission, onward)    Start  Dose/Rate Route Frequency Ordered Stop   10/27/22 0759  azithromycin (ZITHROMAX) tablet 500 mg        500 mg Oral Daily 10/27/22 0800 10/30/22 0959   10/26/22 1000  azithromycin (ZITHROMAX) 500 mg in sodium chloride 0.9 % 250 mL IVPB  Status:  Discontinued        500 mg 250 mL/hr over 60 Minutes Intravenous Every 24 hours 10/25/22 1833 10/27/22 0800   10/26/22 1000  cefTRIAXone (ROCEPHIN) 2 g in sodium chloride 0.9 % 100 mL IVPB        2 g 200 mL/hr over 30 Minutes Intravenous Every 24 hours 10/25/22 1833 10/30/22 0959   10/25/22 1715  cefTRIAXone (ROCEPHIN) 2 g in sodium chloride 0.9 % 100 mL IVPB        2 g 200 mL/hr over 30 Minutes Intravenous  Once 10/25/22 1710 10/25/22 1913   10/25/22 1715   azithromycin (ZITHROMAX) 500 mg in sodium chloride 0.9 % 250 mL IVPB        500 mg 250 mL/hr over 60 Minutes Intravenous  Once 10/25/22 1710 10/25/22 2331          Medications  amLODipine  2.5 mg Oral Daily   atorvastatin  10 mg Oral Daily   azithromycin  500 mg Oral Daily   escitalopram  10 mg Oral Daily   furosemide  40 mg Intravenous Daily   metoprolol succinate  200 mg Oral Daily   pantoprazole  40 mg Oral Daily   traZODone  50 mg Oral Daily   warfarin  2 mg Oral Once per day on Tue Thu   And   warfarin  1.5 mg Oral Once per day on Sun Mon Wed Fri Sat   Warfarin - Pharmacist Dosing Inpatient   Does not apply q1600      Subjective:   Erin Romero was seen and examined today.  Starting to feel better, shortness of breath is improving, no acute nausea or vomiting, chest pain.  diarrhea +,  no abdominal pain   Objective:   Vitals:   10/28/22 0500 10/28/22 0514 10/28/22 0830 10/28/22 1146  BP:  (!) 164/74 (!) 162/90 (!) 163/73  Pulse:  72 82 78  Resp:  _0 Temp:  98.4 F (36.9 C) 97.9 F (36.6 C) 98.7 F (37.1 C)  TempSrc:      SpO2:  95% 98% 97%  Weight: 108.4 kg     Height:        Intake/Output Summary (Last 24 hours) at 10/28/2022 1326 Last data filed at 10/28/2022 1131 Gross per 24 hour  Intake 303.13 ml  Output 801 ml  Net -497.87 ml     Wt Readings from Last 3 Encounters:  10/28/22 108.4 kg  09/10/22 106.1 kg  06/11/22 106.5 kg    Physical Exam General: Alert and oriented x 3, NAD Cardiovascular: S1 S2 clear, RRR.  Respiratory: Diminished breath sounds at the bases Gastrointestinal: Soft, nontender, nondistended, NBS Ext: no pedal edema bilaterally Neuro: no new deficits Psych: Normal affect     Data Reviewed:  I have personally reviewed following labs    CBC Lab Results  Component Value Date   WBC 9.8 10/27/2022   RBC 3.79 (L) 10/27/2022   HGB 11.4 (L) 10/27/2022   HCT 33.9 (L) 10/27/2022   MCV 89.4 10/27/2022   MCH  30.1 10/27/2022   PLT 208 10/27/2022   MCHC 33.6 10/27/2022   RDW 15.0 10/27/2022   LYMPHSABS 2.3 06/09/2022  MONOABS 1.0 06/09/2022   EOSABS 0.2 06/09/2022   BASOSABS 0.1 36/14/4315     Last metabolic panel Lab Results  Component Value Date   NA 144 10/28/2022   K 3.3 (L) 10/28/2022   CL 109 10/28/2022   CO2 27 10/28/2022   BUN 16 10/28/2022   CREATININE 0.66 10/28/2022   GLUCOSE 116 (H) 10/28/2022   GFRNONAA >60 10/28/2022   GFRAA 75 08/10/2020   CALCIUM 8.5 (L) 10/28/2022   PROT 7.9 10/25/2022   ALBUMIN 3.5 10/25/2022   LABGLOB 3.0 02/27/2022   AGRATIO 1.3 02/27/2022   BILITOT 1.5 (H) 10/25/2022   ALKPHOS 72 10/25/2022   AST 21 10/25/2022   ALT 12 10/25/2022   ANIONGAP 8 10/28/2022    CBG (last 3)  No results for input(s): "GLUCAP" in the last 72 hours.    Coagulation Profile: Recent Labs  Lab 10/25/22 1635 10/26/22 0537 10/27/22 0531 10/28/22 0456  INR 2.6* 2.7* 2.8* 2.7*     Radiology Studies: I have personally reviewed the imaging studies  ECHOCARDIOGRAM COMPLETE  Result Date: 10/28/2022    ECHOCARDIOGRAM REPORT   Patient Name:   Erin Romero Date of Exam: 10/27/2022 Medical Rec #:  [id number]           Height:       64.0 in Accession #:    [id number]          Weight:       245.6 lb Date of Birth:  08/12/1951            BSA:          2.135 m Patient Age:    60 years            BP:           162/70 mmHg Patient Gender: F                   HR:           87 bpm. Exam Location:  [facility] Procedure: 2D Echo, Color Doppler and Cardiac Doppler Indications:     R06.00 Dyspnea  History:         Patient has no prior history of Echocardiogram examinations.                  Arrythmias:Atrial Fibrillation; Risk Factors:Hypertension.  Sonographer:     Charmayne Sheer Referring Phys:  [id number] Erin Romero K Hiba Garry Diagnosing Phys: Isaias Cowman MD  Sonographer Comments: Image acquisition challenging due to mastectomy. IMPRESSIONS  1. Left ventricular ejection fraction, by  estimation, is 55 to 60%. The left ventricle has normal function. The left ventricle has no regional wall motion abnormalities. There is moderate left ventricular hypertrophy. Indeterminate diastolic filling due  to E-A fusion.  2. Right ventricular systolic function is normal. The right ventricular size is normal.  3. The mitral valve is normal in structure. Mild mitral valve regurgitation. No evidence of mitral stenosis.  4. The aortic valve is normal in structure. Aortic valve regurgitation is mild. No aortic stenosis is present.  5. The inferior vena cava is normal in size with greater than 50% respiratory variability, suggesting right atrial pressure of 3 mmHg. FINDINGS  Left Ventricle: Left ventricular ejection fraction, by estimation, is 55 to 60%. The left ventricle has normal function. The left ventricle has no regional wall motion abnormalities. The left ventricular internal cavity size was normal in size. There is  moderate left ventricular hypertrophy. Indeterminate diastolic filling due to E-A  fusion. Right Ventricle: The right ventricular size is normal. No increase in right ventricular wall thickness. Right ventricular systolic function is normal. Left Atrium: Left atrial size was normal in size. Right Atrium: Right atrial size was normal in size. Pericardium: There is no evidence of pericardial effusion. Mitral Valve: The mitral valve is normal in structure. Mild mitral valve regurgitation. No evidence of mitral valve stenosis. Tricuspid Valve: The tricuspid valve is normal in structure. Tricuspid valve regurgitation is mild . No evidence of tricuspid stenosis. Aortic Valve: The aortic valve is normal in structure. Aortic valve regurgitation is mild. No aortic stenosis is present. Aortic valve mean gradient measures 5.0 mmHg. Aortic valve peak gradient measures 7.5 mmHg. Aortic valve area, by VTI measures 1.11 cm. Pulmonic Valve: The pulmonic valve was normal in structure. Pulmonic valve  regurgitation is not visualized. No evidence of pulmonic stenosis. Aorta: The aortic root is normal in size and structure. Venous: The inferior vena cava is normal in size with greater than 50% respiratory variability, suggesting right atrial pressure of 3 mmHg. IAS/Shunts: No atrial level shunt detected by color flow Doppler.  LEFT VENTRICLE PLAX 2D LVIDd:         4.10 cm   Diastology LVIDs:         2.90 cm   LV e' medial:    11.50 cm/s LV PW:         1.10 cm   LV E/e' medial:  10.6 LV IVS:        0.80 cm   LV e' lateral:   11.50 cm/s LVOT diam:     1.40 cm   LV E/e' lateral: 10.6 LV SV:         29 LV SV Index:   14 LVOT Area:     1.54 cm  RIGHT VENTRICLE RV Basal diam:  3.20 cm LEFT ATRIUM             Index        RIGHT ATRIUM           Index LA diam:        3.30 cm 1.55 cm/m   RA Area:     10.80 cm LA Vol (A2C):   30.6 ml 14.33 ml/m  RA Volume:   18.50 ml  8.67 ml/m LA Vol (A4C):   60.6 ml 28.39 ml/m LA Biplane Vol: 45.4 ml 21.27 ml/m  AORTIC VALVE                     PULMONIC VALVE AV Area (Vmax):    1.18 cm      PV Vmax:       1.25 m/s AV Area (Vmean):   1.07 cm      PV Vmean:      79.600 cm/s AV Area (VTI):     1.11 cm      PV VTI:        0.216 m AV Vmax:           137.00 cm/s   PV Peak grad:  6.2 mmHg AV Vmean:          107.000 cm/s  PV Mean grad:  3.0 mmHg AV VTI:            0.263 m AV Peak Grad:      7.5 mmHg AV Mean Grad:      5.0 mmHg LVOT Vmax:         105.00 cm/s LVOT Vmean:  74.400 cm/s LVOT VTI:          0.189 m LVOT/AV VTI ratio: 0.72  AORTA Ao Root diam: 2.50 cm MITRAL VALVE MV Area (PHT): 3.42 cm     SHUNTS MV Decel Time: 222 msec     Systemic VTI:  0.19 m MV E velocity: 122.00 cm/s  Systemic Diam: 1.40 cm Isaias Cowman MD Electronically signed by Isaias Cowman MD Signature Date/Time: 10/28/2022/1:25:48 PM    Final    DG Chest 2 View  Result Date: 10/27/2022 CLINICAL DATA:  Dyspnea, shortness of breath, pneumonia EXAM: CHEST - 2 VIEW COMPARISON:  10/25/2022 FINDINGS:  Enlargement of cardiac silhouette. Atherosclerotic calcification aorta. Scattered pulmonary infiltrates, improved since previous exam. Small LEFT pleural effusion and mild LEFT basilar atelectasis. No pneumothorax. Bones demineralized. IMPRESSION: Improved pulmonary infiltrates with noted small LEFT pleural effusion and mild LEFT basilar atelectasis. Enlargement of cardiac silhouette. Aortic Atherosclerosis (ICD10-I70.0). Electronically Signed   By: Lavonia Dana M.D.   On: 10/27/2022 10:41       Alizah Sills M.D. [facility] 10/28/2022, 1:26 PM  Available via Epic secure chat 7am-7pm After 7 pm, please refer to night coverage provider listed on amion.

## 2022-10-29 DIAGNOSIS — J189 Pneumonia, unspecified organism: Secondary | ICD-10-CM | POA: Diagnosis not present

## 2022-10-29 DIAGNOSIS — A419 Sepsis, unspecified organism: Secondary | ICD-10-CM | POA: Diagnosis not present

## 2022-10-29 DIAGNOSIS — I4811 Longstanding persistent atrial fibrillation: Secondary | ICD-10-CM | POA: Diagnosis not present

## 2022-10-29 DIAGNOSIS — I10 Essential (primary) hypertension: Secondary | ICD-10-CM | POA: Diagnosis not present

## 2022-10-29 LAB — BASIC METABOLIC PANEL
Anion gap: 8 (ref 5–15)
BUN: 17 mg/dL (ref 8–23)
CO2: 27 mmol/L (ref 22–32)
Calcium: 8.4 mg/dL — ABNORMAL LOW (ref 8.9–10.3)
Chloride: 105 mmol/L (ref 98–111)
Creatinine, Ser: 0.75 mg/dL (ref 0.44–1.00)
GFR, Estimated: >60 mL/min (ref >60)
Glucose, Bld: 120 mg/dL — ABNORMAL HIGH (ref 70–99)
Potassium: 3.2 mmol/L — ABNORMAL LOW (ref 3.5–5.1)
Sodium: 140 mmol/L (ref 135–145)

## 2022-10-29 LAB — PROTIME-INR
INR: 2.8 — ABNORMAL HIGH (ref 0.8–1.2)
Prothrombin Time: 29.1 seconds — ABNORMAL HIGH (ref 11.4–15.2)

## 2022-10-29 MED ORDER — AMLODIPINE BESYLATE 5 MG PO TABS: 5.0000 mg | ORAL_TABLET | Freq: Every day | ORAL | 2 refills | Status: DC

## 2022-10-29 MED ORDER — LOSARTAN POTASSIUM 100 MG PO TABS: 100.0000 mg | ORAL_TABLET | Freq: Every day | ORAL | 3 refills | Status: DC

## 2022-10-29 MED ORDER — POTASSIUM CHLORIDE CRYS ER 20 MEQ PO TBCR
40.0000 meq | EXTENDED_RELEASE_TABLET | Freq: Once | ORAL | Status: AC
  Administered 2022-10-29: 40 meq via ORAL
  Filled 2022-10-29: qty 2

## 2022-10-29 NOTE — Discharge Summary (Signed)
Physician Discharge Summary   Erin Romero FHQ:[id number] DOB: 1951/06/24 DOA: 10/25/2022  PCP: [name], FNP  Admit date: 10/25/2022 Discharge date: 10/29/2022  Barriers to discharge: none  Admitted From: home Disposition:  Home Discharging physician: [name], MD  Recommendations for Outpatient Follow-up:  Amlodipine increased at discharge; follow up BP  Home Health:  Equipment/Devices:   Discharge Condition: stable CODE STATUS: Full Diet recommendation:  Diet Orders (From admission, onward)     Start     Ordered   10/29/22 0000  Diet - low sodium heart healthy        10/29/22 1120   10/25/22 1828  Diet Heart Room service appropriate? Yes; Fluid consistency: Thin  Diet effective now       Question Answer Comment  Room service appropriate? Yes   Fluid consistency: Thin      10/25/22 1828            Hospital Course:  Sepsis due to multifocal pneumonia, POA -Patient met sepsis criteria due to fevers, mild tachypnea, lactic acidosis, source likely due to multifocal pneumonia -Completed antibiotic course during hospitalization   Acute diastolic CHF -BNP elevated in ED, patient had reported DOE, no orthopnea or PND, felt better after IV Lasix diuresis. -2D echo showed EF 55 to 60%, indeterminate diastolic filling -Dyspnea improving with IV Lasix, 40 mg daily -No Lasix continued at discharge.  Home HCTZ resumed   Atrial fibrillation -HR controlled, continue metoprolol -Continue warfarin     Anxiety, depression -Continue escitalopram     Hypercholesteremia -Continue atorvastatin 10 mg daily     Essential hypertension -Continue amlodipine, metoprolol -BP slightly above goal, amlodipine dose increased at discharge       Insomnia -Continue trazodone nightly   Diarrhea -GI pathogen panel negative, C. difficile negative -Start loperamide   Hypokalemia -Replaced   Ductal carcinoma in situ (DCIS) of left breast - Patient reported that  she is no longer on chemotherapy and status post mastectomy -Continue outpatient follow-up   Obesity Estimated body mass index is 41.02 kg/m as calculated from the following:   Height as of this encounter: 5' 4" (1.626 m).   Weight as of this encounter: 108.4 kg.   The patient's chronic medical conditions were treated accordingly per the patient's home medication regimen except as noted.  On day of discharge, patient was felt deemed stable for discharge. Patient/family member advised to call PCP or come back to ER if needed.   Principal Diagnosis: Sepsis due to pneumonia [facility])  Discharge Diagnoses: Active Hospital Problems   Diagnosis Date Noted   Sepsis due to pneumonia (Erin Romero) 10/25/2022   Ductal carcinoma in situ (DCIS) of left breast 12/12/2021   Moderate pulmonary hypertension (Erin Romero) 11/08/2021   Morbid obesity (Erin Romero) 08/09/2020   Osteoporosis 06/05/2015   Insomnia 05/10/2015   Anxiety 05/10/2015   Essential hypertension 05/10/2015   Hypercholesteremia 10/02/2014   Clinical depression 10/02/2014   A-fib (Erin Romero) 04/18/2014    Resolved Hospital Problems  No resolved problems to display.     Discharge Instructions     Diet - low sodium heart healthy   Complete by: As directed    Increase activity slowly   Complete by: As directed       Allergies as of 10/29/2022       Reactions   Docetaxel Itching, Rash   Other reaction(s): edema   Sulfa Antibiotics Hives        Medication List     STOP taking these medications  bisoprolol-hydrochlorothiazide 10-6.25 MG tablet Commonly known as: ZIAC   letrozole 2.5 MG tablet Commonly known as: FEMARA   lisinopril 10 MG tablet Commonly known as: ZESTRIL   lisinopril 20 MG tablet Commonly known as: ZESTRIL       TAKE these medications    acetaminophen 500 MG tablet Commonly known as: TYLENOL Take 1,500 mg by mouth every 4 (four) hours as needed for moderate pain.   amLODipine 5 MG tablet Commonly known as:  NORVASC Take 1 tablet (5 mg total) by mouth daily. What changed:  medication strength how much to take   atorvastatin 10 MG tablet Commonly known as: LIPITOR TAKE 1 TABLET EVERY DAY   escitalopram 10 MG tablet Commonly known as: LEXAPRO TAKE 1 TABLET EVERY DAY   hydrochlorothiazide 25 MG tablet Commonly known as: HYDRODIURIL TAKE 1 TABLET EVERY DAY What changed: Another medication with the same name was removed. Continue taking this medication, and follow the directions you see here.   Influenza vac split quadrivalent PF 0.5 ML injection Commonly known as: FLUZONE HIGH-DOSE   losartan 100 MG tablet Commonly known as: COZAAR Take 1 tablet (100 mg total) by mouth daily.   metoprolol succinate 100 MG 24 hr tablet Commonly known as: TOPROL-XL TAKE 2 TABLETS EVERY DAY   omeprazole 20 MG capsule Commonly known as: PRILOSEC TAKE 1 CAPSULE EVERY DAY What changed: Another medication with the same name was removed. Continue taking this medication, and follow the directions you see here.   OYSTER SHELL CALCIUM + D PO Take 1 tablet by mouth daily. 687m calcium plus 20 mg Vit D   Pneumovax 23 25 MCG/0.5ML injection Generic drug: pneumococcal 23 valent vaccine   potassium chloride SA 20 MEQ tablet Commonly known as: KLOR-CON M TAKE 1 TABLET EVERY DAY   traZODone 50 MG tablet Commonly known as: DESYREL TAKE 1 TABLET EVERY DAY   triamcinolone ointment 0.5 % Commonly known as: KENALOG Apply 1 application. topically 2 (two) times daily. Bilateral lower legs   Vitamin D-3 25 MCG (1000 UT) Caps Take 1,000 Units by mouth daily.   warfarin 2 MG tablet Commonly known as: COUMADIN Take 2 mg by mouth as directed. 1.5 mg x 5 days and 269mx 2 days. (Takes 27m31mn Tue/Thurs & 1.5mg55m all other days )   warfarin 1 MG tablet Commonly known as: COUMADIN Take 1 tablet by mouth daily. Takes 27mg 60mTue/Thurs & 1.5mg o44mll other days        Allergies  Allergen Reactions    Docetaxel Itching and Rash    Other reaction(s): edema   Sulfa Antibiotics Hives    Consultations:   Procedures:   Discharge Exam: BP (!) 159/76   Pulse 74   Temp 98.3 F (36.8 C)   Resp 18   Ht 5' 4" (1.626 m)   Wt 110.5 kg   SpO2 98%   BMI 41.82 kg/m  Physical Exam Constitutional:      Appearance: Normal appearance.  HENT:     Head: Normocephalic and atraumatic.     Mouth/Throat:     Mouth: Mucous membranes are moist.  Eyes:     Extraocular Movements: Extraocular movements intact.  Cardiovascular:     Rate and Rhythm: Normal rate and regular rhythm.  Pulmonary:     Effort: Pulmonary effort is normal.     Breath sounds: Normal breath sounds.  Abdominal:     General: Bowel sounds are normal. There is no distension.  Palpations: Abdomen is soft.     Tenderness: There is no abdominal tenderness.  Musculoskeletal:        General: Normal range of motion.     Cervical back: Normal range of motion and neck supple.  Skin:    General: Skin is warm and dry.  Neurological:     General: No focal deficit present.     Mental Status: She is alert.  Psychiatric:        Mood and Affect: Mood normal.        Behavior: Behavior normal.      The results of significant diagnostics from this hospitalization (including imaging, microbiology, ancillary and laboratory) are listed below for reference.   Microbiology: Recent Results (from the past 240 hour(s))  Resp Panel by RT-PCR (Flu A&B, Covid) Anterior Nasal Swab     Status: None   Collection Time: 10/25/22  6:04 PM   Specimen: Anterior Nasal Swab  Result Value Ref Range Status   SARS Coronavirus 2 by RT PCR NEGATIVE NEGATIVE Final    Comment: (NOTE) SARS-CoV-2 target nucleic acids are NOT DETECTED.  The SARS-CoV-2 RNA is generally detectable in upper respiratory specimens during the acute phase of infection. The lowest concentration of SARS-CoV-2 viral copies this assay can detect is 138 copies/mL. A negative result  does not preclude SARS-Cov-2 infection and should not be used as the sole basis for treatment or other patient management decisions. A negative result may occur with  improper specimen collection/handling, submission of specimen other than nasopharyngeal swab, presence of viral mutation(s) within the areas targeted by this assay, and inadequate number of viral copies(<138 copies/mL). A negative result must be combined with clinical observations, patient history, and epidemiological information. The expected result is Negative.  Fact Sheet for Patients:  EntrepreneurPulse.com.au  Fact Sheet for Healthcare Providers:  IncredibleEmployment.be  This test is no t yet approved or cleared by the [place] FDA and  has been authorized for detection and/or diagnosis of SARS-CoV-2 by FDA under an Emergency Use Authorization (EUA). This EUA will remain  in effect (meaning this test can be used) for the duration of the COVID-19 declaration under Section 564(b)(1) of the Act, 21 U.S.C.section 360bbb-3(b)(1), unless the authorization is terminated  or revoked sooner.       Influenza A by PCR NEGATIVE NEGATIVE Final   Influenza B by PCR NEGATIVE NEGATIVE Final    Comment: (NOTE) The Xpert Xpress SARS-CoV-2/FLU/RSV plus assay is intended as an aid in the diagnosis of influenza from Nasopharyngeal swab specimens and should not be used as a sole basis for treatment. Nasal washings and aspirates are unacceptable for Xpert Xpress SARS-CoV-2/FLU/RSV testing.  Fact Sheet for Patients: EntrepreneurPulse.com.au  Fact Sheet for Healthcare Providers: IncredibleEmployment.be  This test is not yet approved or cleared by the [place] FDA and has been authorized for detection and/or diagnosis of SARS-CoV-2 by FDA under an Emergency Use Authorization (EUA). This EUA will remain in effect (meaning this test can be used) for  the duration of the COVID-19 declaration under Section 564(b)(1) of the Act, 21 U.S.C. section 360bbb-3(b)(1), unless the authorization is terminated or revoked.  Performed at [facility], [address] [place], [place] [address]   Blood culture (routine x 2)     Status: None (Preliminary result)   Collection Time: 10/25/22  6:04 PM   Specimen: Right Antecubital; Blood  Result Value Ref Range Status   Specimen Description RIGHT ANTECUBITAL  Final   Special Requests  Final    BOTTLES DRAWN AEROBIC AND ANAEROBIC Blood Culture adequate volume   Culture   Final    NO GROWTH 4 DAYS Performed at [facility], [address] [place], [place] [address]    Report Status PENDING  Incomplete  Blood culture (routine x 2)     Status: None (Preliminary result)   Collection Time: 10/25/22  9:19 PM   Specimen: BLOOD  Result Value Ref Range Status   Specimen Description BLOOD BLOOD RIGHT HAND  Final   Special Requests   Final    BOTTLES DRAWN AEROBIC AND ANAEROBIC Blood Culture adequate volume   Culture   Final    NO GROWTH 4 DAYS Performed at [facility], [address], [place], [place] [address]    Report Status PENDING  Incomplete  Gastrointestinal Panel by PCR , Stool     Status: None   Collection Time: 10/27/22  5:15 PM   Specimen: STOOL  Result Value Ref Range Status   Campylobacter species NOT DETECTED NOT DETECTED Final   Plesimonas shigelloides NOT DETECTED NOT DETECTED Final   Salmonella species NOT DETECTED NOT DETECTED Final   Yersinia enterocolitica NOT DETECTED NOT DETECTED Final   Vibrio species NOT DETECTED NOT DETECTED Final   Vibrio cholerae NOT DETECTED NOT DETECTED Final   Enteroaggregative E coli (EAEC) NOT DETECTED NOT DETECTED Final   Enteropathogenic E coli (EPEC) NOT DETECTED NOT DETECTED Final   Enterotoxigenic E coli (ETEC) NOT DETECTED NOT DETECTED Final   Shiga like toxin producing E coli (STEC) NOT DETECTED NOT DETECTED  Final   Shigella/Enteroinvasive E coli (EIEC) NOT DETECTED NOT DETECTED Final   Cryptosporidium NOT DETECTED NOT DETECTED Final   Cyclospora cayetanensis NOT DETECTED NOT DETECTED Final   Entamoeba histolytica NOT DETECTED NOT DETECTED Final   Giardia lamblia NOT DETECTED NOT DETECTED Final   Adenovirus F40/41 NOT DETECTED NOT DETECTED Final   Astrovirus NOT DETECTED NOT DETECTED Final   Norovirus GI/GII NOT DETECTED NOT DETECTED Final   Rotavirus A NOT DETECTED NOT DETECTED Final   Sapovirus (I, II, IV, and V) NOT DETECTED NOT DETECTED Final    Comment: Performed at [facility], [address] [place], [place] [address]  C Difficile Quick Screen w PCR reflex     Status: None   Collection Time: 10/27/22  5:15 PM   Specimen: STOOL  Result Value Ref Range Status   C Diff antigen NEGATIVE NEGATIVE Final   C Diff toxin NEGATIVE NEGATIVE Final   C Diff interpretation No C. difficile detected.  Final    Comment: Performed at [facility], [address] [place], [place] [address]     Labs: BNP (last 3 results) Recent Labs    10/25/22 1641  BNP 170.0*   Basic Metabolic Panel: Recent Labs  Lab 10/25/22 1641 10/26/22 0537 10/27/22 0531 10/28/22 0456 10/29/22 0527  NA 138 142 142 144 140  K 3.8 3.4* 3.6 3.3* 3.2*  CL 106 110 109 109 105  CO2 _0 GLUCOSE 127* 139* 129* 116* 120*  BUN _1 CREATININE 0.67 0.63 0.57 0.66 0.75  CALCIUM 8.6* 8.1* 8.1* 8.5* 8.4*   Liver Function Tests: Recent Labs  Lab 10/25/22 1641  AST 21  ALT 12  ALKPHOS 72  BILITOT 1.5*  PROT 7.9  ALBUMIN 3.5   No results for input(s): "LIPASE", "AMYLASE" in the last 168 hours. No results for input(s): "AMMONIA" in  the last 168 hours. CBC: Recent Labs  Lab 10/25/22 1635 10/26/22 0537 10/27/22 0531  WBC 14.2* 11.1* 9.8  HGB 12.9 11.4* 11.4*  HCT 38.6 33.7* 33.9*  MCV 89.6 89.9 89.4  PLT 236 190 208   Cardiac Enzymes: No results for input(s):  "CKTOTAL", "CKMB", "CKMBINDEX", "TROPONINI" in the last 168 hours. BNP: Invalid input(s): "POCBNP" CBG: No results for input(s): "GLUCAP" in the last 168 hours. D-Dimer Recent Labs    10/27/22 0531  DDIMER 0.31   Hgb A1c No results for input(s): "HGBA1C" in the last 72 hours. Lipid Profile No results for input(s): "CHOL", "HDL", "LDLCALC", "TRIG", "CHOLHDL", "LDLDIRECT" in the last 72 hours. Thyroid function studies No results for input(s): "TSH", "T4TOTAL", "T3FREE", "THYROIDAB" in the last 72 hours.  Invalid input(s): "FREET3" Anemia work up No results for input(s): "VITAMINB12", "FOLATE", "FERRITIN", "TIBC", "IRON", "RETICCTPCT" in the last 72 hours. Urinalysis    Component Value Date/Time   BILIRUBINUR negative 07/26/2015 1625   PROTEINUR 300 (3+) 07/26/2015 1625   UROBILINOGEN 0.2 07/26/2015 1625   NITRITE negative 07/26/2015 1625   LEUKOCYTESUR small (1+) (A) 07/26/2015 1625   Sepsis Labs Recent Labs  Lab 10/25/22 1635 10/26/22 0537 10/27/22 0531  WBC 14.2* 11.1* 9.8   Microbiology Recent Results (from the past 240 hour(s))  Resp Panel by RT-PCR (Flu A&B, Covid) Anterior Nasal Swab     Status: None   Collection Time: 10/25/22  6:04 PM   Specimen: Anterior Nasal Swab  Result Value Ref Range Status   SARS Coronavirus 2 by RT PCR NEGATIVE NEGATIVE Final    Comment: (NOTE) SARS-CoV-2 target nucleic acids are NOT DETECTED.  The SARS-CoV-2 RNA is generally detectable in upper respiratory specimens during the acute phase of infection. The lowest concentration of SARS-CoV-2 viral copies this assay can detect is 138 copies/mL. A negative result does not preclude SARS-Cov-2 infection and should not be used as the sole basis for treatment or other patient management decisions. A negative result may occur with  improper specimen collection/handling, submission of specimen other than nasopharyngeal swab, presence of viral mutation(s) within the areas targeted by this  assay, and inadequate number of viral copies(<138 copies/mL). A negative result must be combined with clinical observations, patient history, and epidemiological information. The expected result is Negative.  Fact Sheet for Patients:  EntrepreneurPulse.com.au  Fact Sheet for Healthcare Providers:  IncredibleEmployment.be  This test is no t yet approved or cleared by the [place] FDA and  has been authorized for detection and/or diagnosis of SARS-CoV-2 by FDA under an Emergency Use Authorization (EUA). This EUA will remain  in effect (meaning this test can be used) for the duration of the COVID-19 declaration under Section 564(b)(1) of the Act, 21 U.S.C.section 360bbb-3(b)(1), unless the authorization is terminated  or revoked sooner.       Influenza A by PCR NEGATIVE NEGATIVE Final   Influenza B by PCR NEGATIVE NEGATIVE Final    Comment: (NOTE) The Xpert Xpress SARS-CoV-2/FLU/RSV plus assay is intended as an aid in the diagnosis of influenza from Nasopharyngeal swab specimens and should not be used as a sole basis for treatment. Nasal washings and aspirates are unacceptable for Xpert Xpress SARS-CoV-2/FLU/RSV testing.  Fact Sheet for Patients: EntrepreneurPulse.com.au  Fact Sheet for Healthcare Providers: IncredibleEmployment.be  This test is not yet approved or cleared by the [place] FDA and has been authorized for detection and/or diagnosis of SARS-CoV-2 by FDA under an Emergency Use Authorization (EUA). This EUA will remain in effect (meaning  this test can be used) for the duration of the COVID-19 declaration under Section 564(b)(1) of the Act, 21 U.S.C. section 360bbb-3(b)(1), unless the authorization is terminated or revoked.  Performed at [facility], [address] [place], [place] [address]   Blood culture (routine x 2)     Status: None (Preliminary result)   Collection  Time: 10/25/22  6:04 PM   Specimen: Right Antecubital; Blood  Result Value Ref Range Status   Specimen Description RIGHT ANTECUBITAL  Final   Special Requests   Final    BOTTLES DRAWN AEROBIC AND ANAEROBIC Blood Culture adequate volume   Culture   Final    NO GROWTH 4 DAYS Performed at [facility], [address], [place], [place] [address]    Report Status PENDING  Incomplete  Blood culture (routine x 2)     Status: None (Preliminary result)   Collection Time: 10/25/22  9:19 PM   Specimen: BLOOD  Result Value Ref Range Status   Specimen Description BLOOD BLOOD RIGHT HAND  Final   Special Requests   Final    BOTTLES DRAWN AEROBIC AND ANAEROBIC Blood Culture adequate volume   Culture   Final    NO GROWTH 4 DAYS Performed at [facility], [address] [place], [place] [address]    Report Status PENDING  Incomplete  Gastrointestinal Panel by PCR , Stool     Status: None   Collection Time: 10/27/22  5:15 PM   Specimen: STOOL  Result Value Ref Range Status   Campylobacter species NOT DETECTED NOT DETECTED Final   Plesimonas shigelloides NOT DETECTED NOT DETECTED Final   Salmonella species NOT DETECTED NOT DETECTED Final   Yersinia enterocolitica NOT DETECTED NOT DETECTED Final   Vibrio species NOT DETECTED NOT DETECTED Final   Vibrio cholerae NOT DETECTED NOT DETECTED Final   Enteroaggregative E coli (EAEC) NOT DETECTED NOT DETECTED Final   Enteropathogenic E coli (EPEC) NOT DETECTED NOT DETECTED Final   Enterotoxigenic E coli (ETEC) NOT DETECTED NOT DETECTED Final   Shiga like toxin producing E coli (STEC) NOT DETECTED NOT DETECTED Final   Shigella/Enteroinvasive E coli (EIEC) NOT DETECTED NOT DETECTED Final   Cryptosporidium NOT DETECTED NOT DETECTED Final   Cyclospora cayetanensis NOT DETECTED NOT DETECTED Final   Entamoeba histolytica NOT DETECTED NOT DETECTED Final   Giardia lamblia NOT DETECTED NOT DETECTED Final   Adenovirus F40/41 NOT DETECTED NOT  DETECTED Final   Astrovirus NOT DETECTED NOT DETECTED Final   Norovirus GI/GII NOT DETECTED NOT DETECTED Final   Rotavirus A NOT DETECTED NOT DETECTED Final   Sapovirus (I, II, IV, and V) NOT DETECTED NOT DETECTED Final    Comment: Performed at [facility], [address] [place], [place] [address]  C Difficile Quick Screen w PCR reflex     Status: None   Collection Time: 10/27/22  5:15 PM   Specimen: STOOL  Result Value Ref Range Status   C Diff antigen NEGATIVE NEGATIVE Final   C Diff toxin NEGATIVE NEGATIVE Final   C Diff interpretation No C. difficile detected.  Final    Comment: Performed at [facility], [address] [place], [place] [address]    Procedures/Studies: ECHOCARDIOGRAM COMPLETE  Result Date: 10/28/2022    ECHOCARDIOGRAM REPORT   Patient Name:   Erin Romero Date of Exam: 10/27/2022 Medical Rec #:  [id number]           Height:  64.0 in Accession #:    [id number]          Weight:       245.6 lb Date of Birth:  1951-09-05            BSA:          2.135 m Patient Age:    71 years            BP:           162/70 mmHg Patient Gender: F                   HR:           87 bpm. Exam Location:  [facility] Procedure: 2D Echo, Color Doppler and Cardiac Doppler Indications:     R06.00 Dyspnea  History:         Patient has no prior history of Echocardiogram examinations.                  Arrythmias:Atrial Fibrillation; Risk Factors:Hypertension.  Sonographer:     Charmayne Sheer Referring Phys:  [id number] RIPUDEEP K RAI Diagnosing Phys: Isaias Cowman MD  Sonographer Comments: Image acquisition challenging due to mastectomy. IMPRESSIONS  1. Left ventricular ejection fraction, by estimation, is 55 to 60%. The left ventricle has normal function. The left ventricle has no regional wall motion abnormalities. There is moderate left ventricular hypertrophy. Indeterminate diastolic filling due  to E-A fusion.  2. Right ventricular systolic function is normal. The right  ventricular size is normal.  3. The mitral valve is normal in structure. Mild mitral valve regurgitation. No evidence of mitral stenosis.  4. The aortic valve is normal in structure. Aortic valve regurgitation is mild. No aortic stenosis is present.  5. The inferior vena cava is normal in size with greater than 50% respiratory variability, suggesting right atrial pressure of 3 mmHg. FINDINGS  Left Ventricle: Left ventricular ejection fraction, by estimation, is 55 to 60%. The left ventricle has normal function. The left ventricle has no regional wall motion abnormalities. The left ventricular internal cavity size was normal in size. There is  moderate left ventricular hypertrophy. Indeterminate diastolic filling due to E-A fusion. Right Ventricle: The right ventricular size is normal. No increase in right ventricular wall thickness. Right ventricular systolic function is normal. Left Atrium: Left atrial size was normal in size. Right Atrium: Right atrial size was normal in size. Pericardium: There is no evidence of pericardial effusion. Mitral Valve: The mitral valve is normal in structure. Mild mitral valve regurgitation. No evidence of mitral valve stenosis. Tricuspid Valve: The tricuspid valve is normal in structure. Tricuspid valve regurgitation is mild . No evidence of tricuspid stenosis. Aortic Valve: The aortic valve is normal in structure. Aortic valve regurgitation is mild. No aortic stenosis is present. Aortic valve mean gradient measures 5.0 mmHg. Aortic valve peak gradient measures 7.5 mmHg. Aortic valve area, by VTI measures 1.11 cm. Pulmonic Valve: The pulmonic valve was normal in structure. Pulmonic valve regurgitation is not visualized. No evidence of pulmonic stenosis. Aorta: The aortic root is normal in size and structure. Venous: The inferior vena cava is normal in size with greater than 50% respiratory variability, suggesting right atrial pressure of 3 mmHg. IAS/Shunts: No atrial level shunt  detected by color flow Doppler.  LEFT VENTRICLE PLAX 2D LVIDd:         4.10 cm   Diastology LVIDs:         2.90 cm   LV e'  medial:    11.50 cm/s LV PW:         1.10 cm   LV E/e' medial:  10.6 LV IVS:        0.80 cm   LV e' lateral:   11.50 cm/s LVOT diam:     1.40 cm   LV E/e' lateral: 10.6 LV SV:         29 LV SV Index:   14 LVOT Area:     1.54 cm  RIGHT VENTRICLE RV Basal diam:  3.20 cm LEFT ATRIUM             Index        RIGHT ATRIUM           Index LA diam:        3.30 cm 1.55 cm/m   RA Area:     10.80 cm LA Vol (A2C):   30.6 ml 14.33 ml/m  RA Volume:   18.50 ml  8.67 ml/m LA Vol (A4C):   60.6 ml 28.39 ml/m LA Biplane Vol: 45.4 ml 21.27 ml/m  AORTIC VALVE                     PULMONIC VALVE AV Area (Vmax):    1.18 cm      PV Vmax:       1.25 m/s AV Area (Vmean):   1.07 cm      PV Vmean:      79.600 cm/s AV Area (VTI):     1.11 cm      PV VTI:        0.216 m AV Vmax:           137.00 cm/s   PV Peak grad:  6.2 mmHg AV Vmean:          107.000 cm/s  PV Mean grad:  3.0 mmHg AV VTI:            0.263 m AV Peak Grad:      7.5 mmHg AV Mean Grad:      5.0 mmHg LVOT Vmax:         105.00 cm/s LVOT Vmean:        74.400 cm/s LVOT VTI:          0.189 m LVOT/AV VTI ratio: 0.72  AORTA Ao Root diam: 2.50 cm MITRAL VALVE MV Area (PHT): 3.42 cm     SHUNTS MV Decel Time: 222 msec     Systemic VTI:  0.19 m MV E velocity: 122.00 cm/s  Systemic Diam: 1.40 cm Isaias Cowman MD Electronically signed by Isaias Cowman MD Signature Date/Time: 10/28/2022/1:25:48 PM    Final    DG Chest 2 View  Result Date: 10/27/2022 CLINICAL DATA:  Dyspnea, shortness of breath, pneumonia EXAM: CHEST - 2 VIEW COMPARISON:  10/25/2022 FINDINGS: Enlargement of cardiac silhouette. Atherosclerotic calcification aorta. Scattered pulmonary infiltrates, improved since previous exam. Small LEFT pleural effusion and mild LEFT basilar atelectasis. No pneumothorax. Bones demineralized. IMPRESSION: Improved pulmonary infiltrates with noted small  LEFT pleural effusion and mild LEFT basilar atelectasis. Enlargement of cardiac silhouette. Aortic Atherosclerosis (ICD10-I70.0). Electronically Signed   By: Lavonia Dana M.D.   On: 10/27/2022 10:41   DG Chest Portable 1 View  Result Date: 10/25/2022 CLINICAL DATA:  Shortness of breath EXAM: PORTABLE CHEST 1 VIEW COMPARISON:  03/07/2013 FINDINGS: Multifocal patchy opacities in the lungs bilaterally, right lower lobe predominant, suspicious for pneumonia. No pleural effusion or pneumothorax. The heart is normal in size.  Thoracic aortic atherosclerosis. IMPRESSION: Multifocal pneumonia, right lower lobe predominant. Electronically Signed   By: Julian Hy M.D.   On: 10/25/2022 17:04     Time coordinating discharge: Over 30 minutes    [name], MD  [facility] 10/29/2022, 5:24 PM

## 2022-10-29 NOTE — TOC Initial Note (Signed)
Transition of Care [facility]) - Initial/Assessment Note    Patient Details  Name: Erin Romero MRN: [id number] Date of Birth: December 28, 1950  Transition of [facility]) CM/SW Contact:    Colen Darling, [place] Phone Number: 10/29/2022, 11:02 AM  Clinical Narrative:                  TOC met with the patient and her PCP is Daneil Dan T. Rollene Rotunda FNP. She drives herself to appointments. She receives mail delivery medications from [place]. She has not had home health. She is requesting HHPT in [place]. She has [employer].  Expected Discharge Plan: [place]     Patient Goals and CMS Choice Patient states their goals for this hospitalization and ongoing recovery are:: discharge with home health   Choice offered to / list presented to : Patient  Expected Discharge Plan and Services Expected Discharge Plan: [place]                           DME Agency: [employer]                  Prior Living Arrangements/Services       Do you feel safe going back to the place where you live?: Yes      Need for Family Participation in Patient Care: No (Comment) Care giver support system in place?: Yes (comment)   Criminal Activity/Legal Involvement Pertinent to Current Situation/Hospitalization: No - Comment as needed  Activities of Daily Living Home Assistive Devices/Equipment: Walker (specify type) ADL Screening (condition at time of admission) Patient's cognitive ability adequate to safely complete daily activities?: Yes Is the patient deaf or have difficulty hearing?: No Does the patient have difficulty seeing, even when wearing glasses/contacts?: No Does the patient have difficulty concentrating, remembering, or making decisions?: No Patient able to express need for assistance with ADLs?: Yes Does the patient have difficulty dressing or bathing?: Yes Independently performs ADLs?: Yes (appropriate for developmental  age) Does the patient have difficulty walking or climbing stairs?: Yes Weakness of Legs: Both Weakness of Arms/Hands: None  Permission Sought/Granted Permission sought to share information with : Family Supports                Emotional Assessment Appearance:: Appears stated age Attitude/Demeanor/Rapport: Engaged          Admission diagnosis:  Multifocal pneumonia [J18.9] Community acquired pneumonia of right lower lobe of lung [J18.9] Sepsis, due to unspecified organism, unspecified whether acute organ dysfunction present [facility]) [A41.9] Patient Active Problem List   Diagnosis Date Noted   Sepsis due to pneumonia ([place]) 10/25/2022   Bilateral leg edema 03/27/2022   Dermatitis 02/27/2022   Mixed stress and urge urinary incontinence 02/27/2022   Annual physical exam 02/27/2022   Chronic fatigue 02/27/2022   Chronic anticoagulation 02/27/2022   Iron deficiency 02/27/2022   Chronic pain of both knees 02/27/2022   Mammogram declined 02/27/2022   Osteoporosis monitoring declined 02/27/2022   Ductal carcinoma in situ (DCIS) of left breast 12/12/2021   History of breast cancer 12/12/2021   Moderate pulmonary hypertension ([place]) 11/08/2021   Iron deficiency anemia    Schatzki's ring    Gastric polyp    Morbid obesity ([place]) 08/09/2020   Hypokalemia 02/04/2016   Cystitis 07/26/2015   Osteoporosis 06/05/2015   Adaptation reaction 05/10/2015   Anxiety 05/10/2015   Arthritis 05/10/2015   Esophageal tissue web 05/10/2015  Genital herpes 05/10/2015   Bloodgood disease 05/10/2015   Blood glucose elevated 05/10/2015   Essential hypertension 05/10/2015   Insomnia 05/10/2015   Headache, migraine 05/10/2015   Avitaminosis D 05/10/2015   Rash and nonspecific skin eruption 04/04/2015   Clinical depression 10/02/2014   Hypercholesteremia 10/02/2014   A-fib ([place]) 04/18/2014   Breast mass, left 02/27/2014   Breast cancer, right breast ([place]) 09/12/2013   Lump or mass in breast     Malignant neoplasm of central portion of female breast ([place])    PCP:  [name], FNP Pharmacy:   [facility] [place], [address] AT [place] [address] Phone: [phone] Fax: [phone]     Social Determinants of Health (SDOH) Interventions    Readmission Risk Interventions     No data to display

## 2022-10-30 ENCOUNTER — Telehealth: Payer: Self-pay

## 2022-10-30 LAB — CULTURE, BLOOD (ROUTINE X 2)
Culture: NO GROWTH
Culture: NO GROWTH
Special Requests: ADEQUATE
Special Requests: ADEQUATE

## 2022-10-30 NOTE — Telephone Encounter (Signed)
Transition Care Management Follow-up Telephone Call Date of discharge and from where:   [place] 10-29-22 Dx: sepsis due to multifocal Pneumonia   How have you been since you were released from the hospital? Feeling ok  Any questions or concerns? No  Items Reviewed: Did the pt receive and understand the discharge instructions provided? Yes  Medications obtained and verified? Yes  Other? No  Any new allergies since your discharge? No  Dietary orders reviewed? Yes Do you have support at home? Yes   Home Care and Equipment/Supplies: Were home health services ordered? Yes PT  If so, what is the name of the agency? Unsure of name  Has the agency set up a time to come to the patient's home? no Were any new equipment or medical supplies ordered?  No What is the name of the medical supply agency? na Were you able to get the supplies/equipment? not applicable Do you have any questions related to the use of the equipment or supplies? No  Functional Questionnaire: (I = Independent and D = Dependent) ADLs: I  Bathing/Dressing- I  Meal Prep- I  Eating- I  Maintaining continence- I  Transferring/Ambulation- I- walker   Managing Meds- I  Follow up appointments reviewed:  PCP Hospital f/u appt confirmed? Yes  Scheduled to see Tally Joe FNP on 11-05-22 @ 240pm Specialist [facility] f/u appt confirmed? No  . Are transportation arrangements needed? No  If their condition worsens, is the pt aware to call PCP or go to the Emergency Dept.? Yes Was the patient provided with contact information for the PCP's office or ED? Yes Was to pt encouraged to call back with questions or concerns? Yes   Juanda Crumble LPN [place] Direct Dial [phone]

## 2022-11-03 NOTE — Progress Notes (Unsigned)
Established patient visit   Patient: Erin Romero   DOB: 08/29/1951   71 y.o. Female  MRN: [id number] Visit Date: 11/05/2022  Today's healthcare provider: [name], FNP  Re Introduced to nurse practitioner role and practice setting.  All questions answered.  Discussed provider/patient relationship and expectations.   I,Jimmi Sidener J Gerene Nedd,acting as a scribe for [name], FNP.,have documented all relevant documentation on the behalf of [name], FNP,as directed by  [name], FNP while in the presence of [name], FNP.   Chief Complaint  Patient presents with   Follow-up   Subjective    HPI  Follow up Hospitalization  Patient was admitted to [facility] on 11/4 and discharged on 11/8. She was treated for sepsis from pneumonia. Treatment for this included lasix, nebulizer, heart monitor, EKG.  She reports excellent compliance with treatment. She reports this condition is improved.  ----------------------------------------------------------------------------------------- -   Medications: Outpatient Medications Prior to Visit  Medication Sig   acetaminophen (TYLENOL) 500 MG tablet Take 1,500 mg by mouth every 4 (four) hours as needed for moderate pain.    amLODipine (NORVASC) 5 MG tablet Take 1 tablet (5 mg total) by mouth daily.   atorvastatin (LIPITOR) 10 MG tablet TAKE 1 TABLET EVERY DAY   Calcium Carb-Cholecalciferol (OYSTER SHELL CALCIUM + D PO) Take 1 tablet by mouth daily. '600mg'$  calcium plus 20 mg Vit D   Cholecalciferol (VITAMIN D-3) 25 MCG (1000 UT) CAPS Take 1,000 Units by mouth daily.    escitalopram (LEXAPRO) 10 MG tablet TAKE 1 TABLET EVERY DAY   hydrochlorothiazide (HYDRODIURIL) 25 MG tablet TAKE 1 TABLET EVERY DAY   Influenza vac split quadrivalent PF (FLUZONE HIGH-DOSE) 0.5 ML injection    losartan (COZAAR) 100 MG tablet Take 1 tablet (100 mg total) by mouth daily.   metoprolol succinate (TOPROL-XL) 100 MG 24 hr tablet TAKE 2 TABLETS  EVERY DAY   omeprazole (PRILOSEC) 20 MG capsule TAKE 1 CAPSULE EVERY DAY   pneumococcal 23 valent vaccine (PNEUMOVAX 23) 25 MCG/0.5ML injection    potassium chloride SA (KLOR-CON M) 20 MEQ tablet TAKE 1 TABLET EVERY DAY   traZODone (DESYREL) 50 MG tablet TAKE 1 TABLET EVERY DAY   triamcinolone ointment (KENALOG) 0.5 % Apply 1 application. topically 2 (two) times daily. Bilateral lower legs   warfarin (COUMADIN) 1 MG tablet Take 1 tablet by mouth daily. Takes '2mg'$  on Tue/Thurs & 1.'5mg'$  on all other days   warfarin (COUMADIN) 2 MG tablet Take 2 mg by mouth as directed. 1.5 mg x 5 days and '2mg'$  x 2 days. (Takes '2mg'$  on Tue/Thurs & 1.'5mg'$  on all other days )   No facility-administered medications prior to visit.    Review of Systems    Objective    BP 139/78 (BP Location: Left Arm, Patient Position: Sitting, Cuff Size: Large)   Pulse 73   Temp 98.3 F (36.8 C) (Oral)   Resp 16   Ht '5\' 4"'$  (1.626 m)   Wt 230 lb (104.3 kg)   SpO2 99%   BMI 39.48 kg/m   Physical Exam Vitals and nursing note reviewed.  Constitutional:      General: She is not in acute distress.    Appearance: Normal appearance. She is obese. She is not ill-appearing, toxic-appearing or diaphoretic.  HENT:     Head: Normocephalic and atraumatic.  Cardiovascular:     Rate and Rhythm: Normal rate and regular rhythm.     Pulses:  Normal pulses.     Heart sounds: Normal heart sounds. No murmur heard.    No friction rub. No gallop.  Pulmonary:     Effort: Pulmonary effort is normal. No respiratory distress.     Breath sounds: Normal breath sounds. No stridor. No wheezing, rhonchi or rales.  Chest:     Chest wall: No tenderness.  Abdominal:     General: Bowel sounds are normal.     Palpations: Abdomen is soft.  Musculoskeletal:        General: No swelling, tenderness, deformity or signs of injury. Normal range of motion.     Right lower leg: No edema.     Left lower leg: No edema.  Skin:    General: Skin is warm and  dry.     Capillary Refill: Capillary refill takes less than 2 seconds.     Coloration: Skin is not jaundiced or pale.     Findings: No bruising, erythema, lesion or rash.  Neurological:     General: No focal deficit present.     Mental Status: She is alert and oriented to person, place, and time. Mental status is at baseline.     Cranial Nerves: No cranial nerve deficit.     Sensory: No sensory deficit.     Motor: No weakness.     Coordination: Coordination normal.  Psychiatric:        Mood and Affect: Mood normal.        Behavior: Behavior normal.        Thought Content: Thought content normal.        Judgment: Judgment normal.     No results found for any visits on 11/05/22.  Assessment & Plan     Problem List Items Addressed This Visit       Respiratory   Sepsis due to pneumonia [facility]) - Primary    Recent hospital inpatient stay; repeat CMP and CBC today given low K and elevated WBC Slight weight loss noted; pt reports GI symptoms inpatient; however, Cdiff was negative Continue to monitor      Relevant Orders   Basic Metabolic Panel (BMET)   CBC   Other Visit Diagnoses     Need for influenza vaccination       Relevant Orders   Flu Vaccine QUAD High Dose(Fluad) (Completed)      Return in about 4 months (around 03/06/2023) for annual examination.     Vonna Kotyk, FNP, have reviewed all documentation for this visit. The documentation on 11/05/22 for the exam, diagnosis, procedures, and orders are all accurate and complete.  [name], [place] [phone] (phone) [phone] (fax)  [place]

## 2022-11-05 ENCOUNTER — Encounter: Payer: Self-pay | Admitting: Family Medicine

## 2022-11-05 ENCOUNTER — Ambulatory Visit (INDEPENDENT_AMBULATORY_CARE_PROVIDER_SITE_OTHER): Payer: Medicare HMO | Admitting: Family Medicine

## 2022-11-05 VITALS — BP 139/78 | HR 73 | Temp 98.3°F | Resp 16 | Ht 64.0 in | Wt 230.0 lb

## 2022-11-05 DIAGNOSIS — A419 Sepsis, unspecified organism: Secondary | ICD-10-CM | POA: Diagnosis not present

## 2022-11-05 DIAGNOSIS — Z23 Encounter for immunization: Secondary | ICD-10-CM | POA: Diagnosis not present

## 2022-11-05 DIAGNOSIS — J189 Pneumonia, unspecified organism: Secondary | ICD-10-CM | POA: Diagnosis not present

## 2022-11-05 NOTE — Assessment & Plan Note (Signed)
Recent hospital inpatient stay; repeat CMP and CBC today given low K and elevated WBC Slight weight loss noted; pt reports GI symptoms inpatient; however, Cdiff was negative Continue to monitor

## 2022-11-06 ENCOUNTER — Encounter: Payer: Self-pay | Admitting: *Deleted

## 2022-11-06 LAB — BASIC METABOLIC PANEL
BUN/Creatinine Ratio: 16 (ref 12–28)
BUN: 14 mg/dL (ref 8–27)
CO2: 24 mmol/L (ref 20–29)
Calcium: 9.6 mg/dL (ref 8.7–10.3)
Chloride: 97 mmol/L (ref 96–106)
Creatinine, Ser: 0.87 mg/dL (ref 0.57–1.00)
Glucose: 120 mg/dL — ABNORMAL HIGH (ref 70–99)
Potassium: 3.8 mmol/L (ref 3.5–5.2)
Sodium: 137 mmol/L (ref 134–144)
eGFR: 71 mL/min/{1.73_m2} (ref >59)

## 2022-11-06 LAB — CBC
Hematocrit: 40.1 % (ref 34.0–46.6)
Hemoglobin: 13.6 g/dL (ref 11.1–15.9)
MCH: 29.6 pg (ref 26.6–33.0)
MCHC: 33.9 g/dL (ref 31.5–35.7)
MCV: 87 fL (ref 79–97)
Platelets: 265 10*3/uL (ref 150–450)
RBC: 4.6 x10E6/uL (ref 3.77–5.28)
RDW: 14 % (ref 11.7–15.4)
WBC: 11 10*3/uL — ABNORMAL HIGH (ref 3.4–10.8)

## 2022-11-06 NOTE — Progress Notes (Signed)
Potassium and calcium have normalized; slight elevation remains in infection cells/WBC.

## 2022-11-17 DIAGNOSIS — G4719 Other hypersomnia: Secondary | ICD-10-CM | POA: Diagnosis not present

## 2022-11-17 DIAGNOSIS — R918 Other nonspecific abnormal finding of lung field: Secondary | ICD-10-CM | POA: Diagnosis not present

## 2022-11-17 DIAGNOSIS — I517 Cardiomegaly: Secondary | ICD-10-CM | POA: Diagnosis not present

## 2022-11-17 DIAGNOSIS — R051 Acute cough: Secondary | ICD-10-CM | POA: Diagnosis not present

## 2022-12-02 DIAGNOSIS — Z08 Encounter for follow-up examination after completed treatment for malignant neoplasm: Secondary | ICD-10-CM | POA: Diagnosis not present

## 2022-12-02 DIAGNOSIS — Z853 Personal history of malignant neoplasm of breast: Secondary | ICD-10-CM | POA: Diagnosis not present

## 2022-12-02 DIAGNOSIS — Z7901 Long term (current) use of anticoagulants: Secondary | ICD-10-CM | POA: Diagnosis not present

## 2022-12-14 ENCOUNTER — Other Ambulatory Visit: Payer: Self-pay | Admitting: Family Medicine

## 2022-12-14 DIAGNOSIS — L309 Dermatitis, unspecified: Secondary | ICD-10-CM

## 2022-12-17 DIAGNOSIS — Z7901 Long term (current) use of anticoagulants: Secondary | ICD-10-CM | POA: Diagnosis not present

## 2022-12-25 DIAGNOSIS — Z7901 Long term (current) use of anticoagulants: Secondary | ICD-10-CM | POA: Diagnosis not present

## 2023-01-15 DIAGNOSIS — G4733 Obstructive sleep apnea (adult) (pediatric): Secondary | ICD-10-CM | POA: Diagnosis not present

## 2023-01-15 DIAGNOSIS — G4719 Other hypersomnia: Secondary | ICD-10-CM | POA: Diagnosis not present

## 2023-01-15 DIAGNOSIS — R0602 Shortness of breath: Secondary | ICD-10-CM | POA: Diagnosis not present

## 2023-01-15 DIAGNOSIS — G479 Sleep disorder, unspecified: Secondary | ICD-10-CM | POA: Diagnosis not present

## 2023-01-23 DIAGNOSIS — Z7901 Long term (current) use of anticoagulants: Secondary | ICD-10-CM | POA: Diagnosis not present

## 2023-02-20 DIAGNOSIS — Z7901 Long term (current) use of anticoagulants: Secondary | ICD-10-CM | POA: Diagnosis not present

## 2023-03-02 DIAGNOSIS — Z7901 Long term (current) use of anticoagulants: Secondary | ICD-10-CM | POA: Diagnosis not present

## 2023-03-19 ENCOUNTER — Other Ambulatory Visit: Payer: Self-pay | Admitting: Family Medicine

## 2023-03-19 DIAGNOSIS — I10 Essential (primary) hypertension: Secondary | ICD-10-CM

## 2023-03-19 DIAGNOSIS — R600 Localized edema: Secondary | ICD-10-CM

## 2023-04-06 DIAGNOSIS — Z7901 Long term (current) use of anticoagulants: Secondary | ICD-10-CM | POA: Diagnosis not present

## 2023-04-16 DIAGNOSIS — Z7901 Long term (current) use of anticoagulants: Secondary | ICD-10-CM | POA: Diagnosis not present

## 2023-04-16 DIAGNOSIS — G4733 Obstructive sleep apnea (adult) (pediatric): Secondary | ICD-10-CM | POA: Diagnosis not present

## 2023-04-16 DIAGNOSIS — R0602 Shortness of breath: Secondary | ICD-10-CM | POA: Diagnosis not present

## 2023-04-21 ENCOUNTER — Other Ambulatory Visit: Payer: Self-pay | Admitting: Family Medicine

## 2023-04-21 DIAGNOSIS — F419 Anxiety disorder, unspecified: Secondary | ICD-10-CM

## 2023-04-21 DIAGNOSIS — I10 Essential (primary) hypertension: Secondary | ICD-10-CM

## 2023-04-21 DIAGNOSIS — I4811 Longstanding persistent atrial fibrillation: Secondary | ICD-10-CM

## 2023-04-21 DIAGNOSIS — G4700 Insomnia, unspecified: Secondary | ICD-10-CM

## 2023-04-22 DIAGNOSIS — R0602 Shortness of breath: Secondary | ICD-10-CM | POA: Diagnosis not present

## 2023-04-22 DIAGNOSIS — G4733 Obstructive sleep apnea (adult) (pediatric): Secondary | ICD-10-CM | POA: Diagnosis not present

## 2023-04-22 DIAGNOSIS — I482 Chronic atrial fibrillation, unspecified: Secondary | ICD-10-CM | POA: Diagnosis not present

## 2023-04-22 DIAGNOSIS — K219 Gastro-esophageal reflux disease without esophagitis: Secondary | ICD-10-CM | POA: Diagnosis not present

## 2023-04-22 DIAGNOSIS — R791 Abnormal coagulation profile: Secondary | ICD-10-CM | POA: Diagnosis not present

## 2023-04-22 DIAGNOSIS — I272 Pulmonary hypertension, unspecified: Secondary | ICD-10-CM | POA: Diagnosis not present

## 2023-04-22 DIAGNOSIS — I10 Essential (primary) hypertension: Secondary | ICD-10-CM | POA: Diagnosis not present

## 2023-04-22 DIAGNOSIS — E7800 Pure hypercholesterolemia, unspecified: Secondary | ICD-10-CM | POA: Diagnosis not present

## 2023-04-22 DIAGNOSIS — E669 Obesity, unspecified: Secondary | ICD-10-CM | POA: Diagnosis not present

## 2023-04-22 DIAGNOSIS — Z7901 Long term (current) use of anticoagulants: Secondary | ICD-10-CM | POA: Diagnosis not present

## 2023-04-22 NOTE — Telephone Encounter (Signed)
Requested Prescriptions  Pending Prescriptions Disp Refills   potassium chloride SA (KLOR-CON M) 20 MEQ tablet [Pharmacy Med Name: POTASSIUM CHLORIDE ER 20 MEQ Tablet Extended Release] 90 tablet 0    Sig: TAKE 1 TABLET EVERY DAY     Endocrinology:  Minerals - Potassium Supplementation Passed - 04/21/2023 11:40 AM      Passed - K in normal range and within 360 days    Potassium  Date Value Ref Range Status  11/05/2022 3.8 3.5 - 5.2 mmol/L Final  12/20/2014 3.5 3.5 - 5.1 mmol/L Final         Passed - Cr in normal range and within 360 days    Creatinine  Date Value Ref Range Status  12/20/2014 0.86 0.60 - 1.30 mg/dL Final   Creatinine, Ser  Date Value Ref Range Status  11/05/2022 0.87 0.57 - 1.00 mg/dL Final         Passed - Valid encounter within last 12 months    Recent Outpatient Visits           5 months ago Sepsis due to pneumonia [facility])   [place] [facility] Jacky Kindle, FNP   1 year ago Longstanding persistent atrial fibrillation [facility])   [place] [facility] Jacky Kindle, FNP   1 year ago Anxiety   [facility] [place], Marzella Schlein, MD   2 years ago Morbid obesity [facility])   [place] [facility] Trey Sailors, [place]   2 years ago Annual physical exam   [facility] Jodi Marble, Adriana M, PA-C               traZODone (DESYREL) 50 MG tablet [Pharmacy Med Name: TRAZODONE HYDROCHLORIDE 50 MG Tablet] 90 tablet 0    Sig: TAKE 1 TABLET EVERY DAY     Psychiatry: Antidepressants - Serotonin Modulator Passed - 04/21/2023 11:40 AM      Passed - Completed PHQ-2 or PHQ-9 in the last 360 days      Passed - Valid encounter within last 6 months    Recent Outpatient Visits           5 months ago Sepsis due to pneumonia [facility])   [place] [facility] Merita Norton T, FNP   1 year ago Longstanding persistent atrial fibrillation [facility])   [place]  [facility] Jacky Kindle, FNP   1 year ago Anxiety   [place] [facility] [place], Marzella Schlein, MD   2 years ago Morbid obesity [facility])   [place] [facility] Trey Sailors, [place]   2 years ago Annual physical exam   [facility] Jodi Marble, Adriana M, PA-C               escitalopram (LEXAPRO) 10 MG tablet [Pharmacy Med Name: ESCITALOPRAM OXALATE 10 MG Tablet] 90 tablet 0    Sig: TAKE 1 TABLET EVERY DAY     Psychiatry:  Antidepressants - SSRI Passed - 04/21/2023 11:40 AM      Passed - Completed PHQ-2 or PHQ-9 in the last 360 days      Passed - Valid encounter within last 6 months    Recent Outpatient Visits           5 months ago Sepsis due to pneumonia [facility])   [facility] Merita Norton T, FNP   1 year ago Longstanding persistent atrial fibrillation (HCC)  [place] [facility] Merita Norton T, FNP   1 year ago Anxiety   [place] [facility] [place], Marzella Schlein, MD   2 years ago Morbid obesity [facility])   [facility] Osvaldo Angst M, [place]   2 years ago Annual physical exam   [facility] Osvaldo Angst M, [place]               metoprolol succinate (TOPROL-XL) 100 MG 24 hr tablet [Pharmacy Med Name: METOPROLOL SUCCINATE ER 100 MG Tablet Extended Release 24 Hour] 180 tablet 0    Sig: TAKE 2 TABLETS EVERY DAY     Cardiovascular:  Beta Blockers Passed - 04/21/2023 11:40 AM      Passed - Last BP in normal range    BP Readings from Last 1 Encounters:  11/05/22 139/78         Passed - Last Heart Rate in normal range    Pulse Readings from Last 1 Encounters:  11/05/22 73         Passed - Valid encounter within last 6 months    Recent Outpatient Visits           5 months ago Sepsis due to pneumonia [facility])   [place] [facility] Merita Norton T, FNP   1 year ago  Longstanding persistent atrial fibrillation [facility])   [place] [facility] Jacky Kindle, FNP   1 year ago Anxiety   [place] [facility] [place], Marzella Schlein, MD   2 years ago Morbid obesity [facility])   [facility] Trey Sailors, [place]   2 years ago Annual physical exam   [facility] [place], Ricki Rodriguez [place], [place]

## 2023-04-23 ENCOUNTER — Other Ambulatory Visit: Payer: Self-pay | Admitting: Family Medicine

## 2023-04-23 DIAGNOSIS — E7800 Pure hypercholesterolemia, unspecified: Secondary | ICD-10-CM

## 2023-04-23 NOTE — Telephone Encounter (Signed)
Requested Prescriptions  Pending Prescriptions Disp Refills   atorvastatin (LIPITOR) 10 MG tablet [Pharmacy Med Name: ATORVASTATIN CALCIUM 10 MG Tablet] 90 tablet 0    Sig: TAKE 1 TABLET EVERY DAY     Cardiovascular:  Antilipid - Statins Failed - 04/23/2023  2:58 AM      Failed - Lipid Panel in normal range within the last 12 months    Cholesterol, Total  Date Value Ref Range Status  02/27/2022 126 100 - 199 mg/dL Final   LDL Cholesterol (Calc)  Date Value Ref Range Status  11/27/2017 102 (H) mg/dL (calc) Final    Comment:    Reference range: <100 . Desirable range <100 mg/dL for primary prevention;   <70 mg/dL for patients with CHD or diabetic patients  with > or = 2 CHD risk factors. [place] Kitchen LDL-C is now calculated using the Martin-Hopkins  calculation, which is a validated novel method providing  better accuracy than the Friedewald equation in the  estimation of LDL-C.  Horald Pollen et al. Lenox Ahr. 1610;960(45): 2061-2068  (http://education.Quest[employer].com/faq/FAQ164)    LDL Chol Calc (NIH)  Date Value Ref Range Status  02/27/2022 50 0 - 99 mg/dL Final   HDL  Date Value Ref Range Status  02/27/2022 50 >39 mg/dL Final   Triglycerides  Date Value Ref Range Status  02/27/2022 152 (H) 0 - 149 mg/dL Final         Passed - Patient is not pregnant      Passed - Valid encounter within last 12 months    Recent Outpatient Visits           5 months ago Sepsis due to pneumonia [facility])   [place] [facility] Merita Norton T, FNP   1 year ago Longstanding persistent atrial fibrillation [facility])   [place] [facility] Jacky Kindle, FNP   1 year ago Anxiety   [place] [facility] [place], Marzella Schlein, MD   2 years ago Morbid obesity [facility])   [place] [facility] Trey Sailors, [place]   2 years ago Annual physical exam   [facility] [place], Ricki Rodriguez [place], [place]

## 2023-05-06 DIAGNOSIS — Z7901 Long term (current) use of anticoagulants: Secondary | ICD-10-CM | POA: Diagnosis not present

## 2023-05-10 ENCOUNTER — Other Ambulatory Visit: Payer: Self-pay | Admitting: Family Medicine

## 2023-05-10 DIAGNOSIS — R600 Localized edema: Secondary | ICD-10-CM

## 2023-05-10 DIAGNOSIS — I10 Essential (primary) hypertension: Secondary | ICD-10-CM

## 2023-05-11 NOTE — Telephone Encounter (Signed)
Requested medication (s) are due for refill today - yes  Requested medication (s) are on the active medication list -yes  Future visit scheduled -no  Last refill: 03/19/23 #60  Notes to clinic: Attempted to contact patient to schedule appointment- left message to call office. Last RF has notes.  Requested Prescriptions  Pending Prescriptions Disp Refills   hydrochlorothiazide (HYDRODIURIL) 25 MG tablet [Pharmacy Med Name: HYDROCHLOROTHIAZIDE 25 MG Tablet] 60 tablet 5    Sig: TAKE 1 TABLET EVERY DAY (NEED MD APPOINTMENT FOR REFILLS)     Cardiovascular: Diuretics - Thiazide Failed - 05/10/2023  2:51 AM      Failed - Cr in normal range and within 180 days    Creatinine  Date Value Ref Range Status  12/20/2014 0.86 0.60 - 1.30 mg/dL Final   Creatinine, Ser  Date Value Ref Range Status  11/05/2022 0.87 0.57 - 1.00 mg/dL Final         Failed - K in normal range and within 180 days    Potassium  Date Value Ref Range Status  11/05/2022 3.8 3.5 - 5.2 mmol/L Final  12/20/2014 3.5 3.5 - 5.1 mmol/L Final         Failed - Na in normal range and within 180 days    Sodium  Date Value Ref Range Status  11/05/2022 137 134 - 144 mmol/L Final  12/20/2014 142 136 - 145 mmol/L Final         Failed - Valid encounter within last 6 months    Recent Outpatient Visits           6 months ago Sepsis due to pneumonia [facility])   [place] [facility] Merita Norton T, FNP   1 year ago Longstanding persistent atrial fibrillation [facility])   [place] [facility] Merita Norton T, FNP   1 year ago Anxiety   [place] [facility] [place], Marzella Schlein, [place]   2 years ago Morbid obesity [facility])   [place] [facility] Osvaldo Angst M, [place]   2 years ago Annual physical exam   [facility] Jodi Marble, Adriana M, [place]              Passed - Last BP in normal range    BP Readings from Last 1 Encounters:  11/05/22  139/78            Requested Prescriptions  Pending Prescriptions Disp Refills   hydrochlorothiazide (HYDRODIURIL) 25 MG tablet [Pharmacy Med Name: HYDROCHLOROTHIAZIDE 25 MG Tablet] 60 tablet 5    Sig: TAKE 1 TABLET EVERY DAY (NEED MD APPOINTMENT FOR REFILLS)     Cardiovascular: Diuretics - Thiazide Failed - 05/10/2023  2:51 AM      Failed - Cr in normal range and within 180 days    Creatinine  Date Value Ref Range Status  12/20/2014 0.86 0.60 - 1.30 mg/dL Final   Creatinine, Ser  Date Value Ref Range Status  11/05/2022 0.87 0.57 - 1.00 mg/dL Final         Failed - K in normal range and within 180 days    Potassium  Date Value Ref Range Status  11/05/2022 3.8 3.5 - 5.2 mmol/L Final  12/20/2014 3.5 3.5 - 5.1 mmol/L Final         Failed - Na in normal range and within 180 days    Sodium  Date Value Ref Range Status  11/05/2022 137 134 - 144 mmol/L Final  12/20/2014 142  136 - 145 mmol/L Final         Failed - Valid encounter within last 6 months    Recent Outpatient Visits           6 months ago Sepsis due to pneumonia [facility])   [place] [facility] Merita Norton T, FNP   1 year ago Longstanding persistent atrial fibrillation [facility])   [place] [facility] Jacky Kindle, FNP   1 year ago Anxiety    [facility] [place], Marzella Schlein, [place]   2 years ago Morbid obesity [facility])   [facility] Trey Sailors, [place]   2 years ago Annual physical exam   [facility] Osvaldo Angst M, [place]              Passed - Last BP in normal range    BP Readings from Last 1 Encounters:  11/05/22 139/78

## 2023-06-08 DIAGNOSIS — Z7901 Long term (current) use of anticoagulants: Secondary | ICD-10-CM | POA: Diagnosis not present

## 2023-06-12 ENCOUNTER — Inpatient Hospital Stay: Payer: Medicare HMO | Attending: Oncology

## 2023-06-12 ENCOUNTER — Other Ambulatory Visit: Payer: Medicare HMO

## 2023-06-12 DIAGNOSIS — Z9012 Acquired absence of left breast and nipple: Secondary | ICD-10-CM | POA: Insufficient documentation

## 2023-06-12 DIAGNOSIS — Z862 Personal history of diseases of the blood and blood-forming organs and certain disorders involving the immune mechanism: Secondary | ICD-10-CM

## 2023-06-12 DIAGNOSIS — E611 Iron deficiency: Secondary | ICD-10-CM | POA: Insufficient documentation

## 2023-06-12 DIAGNOSIS — Z853 Personal history of malignant neoplasm of breast: Secondary | ICD-10-CM | POA: Insufficient documentation

## 2023-06-12 DIAGNOSIS — M810 Age-related osteoporosis without current pathological fracture: Secondary | ICD-10-CM | POA: Insufficient documentation

## 2023-06-12 DIAGNOSIS — Z170 Estrogen receptor positive status [ER+]: Secondary | ICD-10-CM

## 2023-06-12 LAB — CBC WITH DIFFERENTIAL/PLATELET
Abs Immature Granulocytes: 0.03 10*3/uL (ref 0.00–0.07)
Basophils Absolute: 0.1 10*3/uL (ref 0.0–0.1)
Basophils Relative: 1 %
Eosinophils Absolute: 0.1 10*3/uL (ref 0.0–0.5)
Eosinophils Relative: 1 %
HCT: 38.3 % (ref 36.0–46.0)
Hemoglobin: 12.8 g/dL (ref 12.0–15.0)
Immature Granulocytes: 0 %
Lymphocytes Relative: 17 %
Lymphs Abs: 1.6 10*3/uL (ref 0.7–4.0)
MCH: 28.9 pg (ref 26.0–34.0)
MCHC: 33.4 g/dL (ref 30.0–36.0)
MCV: 86.5 fL (ref 80.0–100.0)
Monocytes Absolute: 0.7 10*3/uL (ref 0.1–1.0)
Monocytes Relative: 7 %
Neutro Abs: 7.2 10*3/uL (ref 1.7–7.7)
Neutrophils Relative %: 74 %
Platelets: 192 10*3/uL (ref 150–400)
RBC: 4.43 MIL/uL (ref 3.87–5.11)
RDW: 14.5 % (ref 11.5–15.5)
WBC: 9.7 10*3/uL (ref 4.0–10.5)
nRBC: 0 % (ref 0.0–0.2)

## 2023-06-12 LAB — COMPREHENSIVE METABOLIC PANEL
ALT: 12 U/L (ref 0–44)
AST: 20 U/L (ref 15–41)
Albumin: 3.6 g/dL (ref 3.5–5.0)
Alkaline Phosphatase: 68 U/L (ref 38–126)
Anion gap: 11 (ref 5–15)
BUN: 20 mg/dL (ref 8–23)
CO2: 25 mmol/L (ref 22–32)
Calcium: 8.8 mg/dL — ABNORMAL LOW (ref 8.9–10.3)
Chloride: 100 mmol/L (ref 98–111)
Creatinine, Ser: 0.79 mg/dL (ref 0.44–1.00)
GFR, Estimated: >60 mL/min (ref >60)
Glucose, Bld: 159 mg/dL — ABNORMAL HIGH (ref 70–99)
Potassium: 3.5 mmol/L (ref 3.5–5.1)
Sodium: 136 mmol/L (ref 135–145)
Total Bilirubin: 0.9 mg/dL (ref 0.3–1.2)
Total Protein: 7.7 g/dL (ref 6.5–8.1)

## 2023-06-12 LAB — FERRITIN: Ferritin: 91 ng/mL (ref 11–307)

## 2023-06-12 LAB — IRON AND TIBC
Iron: 71 ug/dL (ref 28–170)
Saturation Ratios: 20 % (ref 10.4–31.8)
TIBC: 347 ug/dL (ref 250–450)
UIBC: 276 ug/dL

## 2023-06-13 LAB — CANCER ANTIGEN 27.29: CA 27.29: 19.8 U/mL (ref 0.0–38.6)

## 2023-06-15 ENCOUNTER — Inpatient Hospital Stay: Payer: Medicare HMO | Admitting: Oncology

## 2023-06-15 ENCOUNTER — Encounter: Payer: Self-pay | Admitting: Oncology

## 2023-06-15 VITALS — BP 133/72 | HR 70 | Temp 98.8°F | Resp 18 | Ht 64.0 in | Wt 234.0 lb

## 2023-06-15 DIAGNOSIS — Z862 Personal history of diseases of the blood and blood-forming organs and certain disorders involving the immune mechanism: Secondary | ICD-10-CM

## 2023-06-15 DIAGNOSIS — M8080XS Other osteoporosis with current pathological fracture, unspecified site, sequela: Secondary | ICD-10-CM

## 2023-06-15 DIAGNOSIS — Z853 Personal history of malignant neoplasm of breast: Secondary | ICD-10-CM | POA: Diagnosis not present

## 2023-06-15 DIAGNOSIS — E611 Iron deficiency: Secondary | ICD-10-CM | POA: Diagnosis not present

## 2023-06-15 DIAGNOSIS — M810 Age-related osteoporosis without current pathological fracture: Secondary | ICD-10-CM | POA: Diagnosis not present

## 2023-06-15 DIAGNOSIS — Z9012 Acquired absence of left breast and nipple: Secondary | ICD-10-CM | POA: Diagnosis not present

## 2023-06-15 NOTE — Progress Notes (Signed)
Hematology/Oncology Progress note [phone] [phone]     Clinic day:  06/15/23   Chief Complaint: Erin Romero is a 72 y.o. female with stage IIB right breast cancer who is here for follow up.    ASSESSMENT & PLAN:   History of breast cancer #01/24/2013 stage IIB (T2N1M0) right breast cancer s/p right mastectomy  ER/ PR+ and HER-2- finished letrozole x 5 years, stopped in 09/2018- BCI showed low benefit of extended therapy #11/2019  left DCIS, high-grade s/p left mastectomy Off letrozole due to lack of benefit from aromatase inhibitor with bilateral mastectomy.  Labs are reviewed and discussed with patient. Continue clinical surveillance.   Osteoporosis #Osteoporosis,continue calcium and vitamin D supplementation.   she declined repeating DEXA She previously declines bisphosphonate treatments.  Iron deficiency Resolved. Recommend to stop oral iron supplementation   Orders Placed This Encounter  Procedures   CBC with Differential (Cancer Center Only)    Standing Status:   Future    Standing Expiration Date:   06/14/2024   CMP (Cancer Center only)    Standing Status:   Future    Standing Expiration Date:   06/14/2024   Iron and TIBC    Standing Status:   Future    Standing Expiration Date:   06/14/2024   Ferritin    Standing Status:   Future    Standing Expiration Date:   06/14/2024   Cancer antigen 27.29    Standing Status:   Future    Standing Expiration Date:   06/14/2024    All questions were answered. The patient knows to call the clinic with any problems, questions or concerns.  Rickard Patience, MD, PhD [facility] Hematology Oncology 06/15/2023   PERTINENT ONCOLOGY HISTORY Patient previously followed up with Dr. Merlene Pulling.  Switch care to me on 08/16/2019. Extensive medical records were reviewed. history of stage IIB (T2N1M0) right breast cancer status post radical mastectomy on 2/3/2 medical 014.  Pathology revealed a 3 cm grade 2 invasive  mammary carcinoma. 0/2 sentinel lymph nodes were positive.  In non-sentinel lymph nodes were positive for 0.5 cm macro metastasis with extracapsular extension.  DCIS was present.  ER >90 percent, PR>90%, HER-2/neu negative. Patient underwent 3 cycles of adjuvant chemotherapy with Taxotere and Cytoxan (03/08/2013 - 04/20/2013).  Treatment was complicated with erythromelalgia and an urticarial reaction.  And she did not receive any additional chemotherapy. Adjuvant radiation finished in July 2014.  Patient was started on letrozole in August 2014 and she self discontinued in October 2019. BCI showed low likelihood of benefit from extended endocrine therapy.  13.6% risk of late recurrence years 5-10. Patient has been off Letrozole since 09/2018.   Mammogram on 02/06/2014 was suspicious for left breast mass.  Left breast biopsy on 02/27/2014 was negative for malignancy.  Left-sided mammogram on 10/05/2017 was negative.   Left breast screening mammogram on 10/11/2018 revealed no evidence of malignancy.  #  Osteoporosis,  Bone density study on 06/05/2015 revealed osteopenia with a T score of -1.9 in the left femoral neck.  Bone density on 09/08/2018 revealed osteoporosis with a T-score of -2.5 in the left femoral neck and -2.2 in AP spine L2-L4.  Patient is on calcium and vitamin D.  She was on an oral bisphosphonate x 1 month.  Patient previosly declined Prolia. with T score of -2.5 of left femoral neck and -2.2 AP spine L2-L4.  Previously declined Prolia.  She reports that she had tried some bone strengthen medication for 1 month last  year, and did not tolerate.  She cannot remember the name of the medication.  # 01/30/19 left DCIS ER +, s/p mastectomy Patient was resumed on letrozole in December 2020.  After discussion with Dr. Lemar Livings, decision was made to stop letrozole given that patient has now had bilateral mastectomy and benefit of prophylaxis from AI is very small   #Osteoporosis, continue  calcium and vitamin D supplementation. Previously discussed about bisphosphonate or Prolia treatment.  Patient declined.   #Family history of cancer, personal history of invasive breast cancer.  No newly diagnosed DCIS, I discussed with patient about genetic testing.  Patient declines.  INTERVAL HISTORY Erin Romero is a 72 y.o. female who has above history reviewed by me today presents for follow up visit for management of breast cancer Patient reports no new concerns today. No concerns at previous mastectomy sites.  Past Medical History:  Diagnosis Date   Anxiety    Atrial fibrillation (HCC) 2014   Breast cancer [facility]) 2014   right breast   Dysrhythmia    Fibrocystic breast disease 2005   GERD (gastroesophageal reflux disease)    Hypertension 2005   Lump or mass in breast 2014   right mastectomy   Malignant neoplasm of central portion of female breast (HCC) 01/24/2013   Stage II,T2, N1a. ER/PR 90%, HER-2/neu: Not overexpressed. Mastectomy with sentinel node biopsy. 2 sentinel nodes negative, non-sentinel node w/ 5 mm macrometastatic foci in non-sentinel node.  Received post mastectomy radiation.     Past Surgical History:  Procedure Laterality Date   ABDOMINAL HYSTERECTOMY  1999   BREAST BIOPSY Left 2015   BENIGN FIBROTIC BREAST TISSUE WITH CLUSTERED APOCRINE   BREAST BIOPSY Left 11/02/2019   affirm bx, x marker, path pending   BREAST SURGERY Right 2005,2014   biopsy   BREAST SURGERY Right 2014   mastectomy   COLONOSCOPY  2002   COLONOSCOPY WITH PROPOFOL N/A 10/16/2015   Procedure: COLONOSCOPY WITH PROPOFOL;  Surgeon: Earline Mayotte, MD;  Location: [facility] ENDOSCOPY;  Service: Endoscopy;  Laterality: N/A;   COLONOSCOPY WITH PROPOFOL N/A 10/31/2020   Procedure: COLONOSCOPY WITH PROPOFOL;  Surgeon: Pasty Spillers, MD;  Location: [facility] ENDOSCOPY;  Service: Endoscopy;  Laterality: N/A;   ESOPHAGOGASTRODUODENOSCOPY (EGD) WITH PROPOFOL N/A 10/31/2020   Procedure:  ESOPHAGOGASTRODUODENOSCOPY (EGD) WITH PROPOFOL;  Surgeon: Pasty Spillers, MD;  Location: [facility] ENDOSCOPY;  Service: Endoscopy;  Laterality: N/A;   GIVENS CAPSULE STUDY N/A 12/27/2020   Procedure: GIVENS CAPSULE STUDY;  Surgeon: Pasty Spillers, MD;  Location: [facility] ENDOSCOPY;  Service: Endoscopy;  Laterality: N/A;   KNEE SURGERY Left 2005   MASTECTOMY Right 2014   radiation chemo   SIMPLE MASTECTOMY WITH AXILLARY SENTINEL NODE BIOPSY Left 11/30/2019   Procedure: SIMPLE MASTECTOMY WITH AXILLARY SENTINEL NODE BIOPSY;  Surgeon: Earline Mayotte, MD;  Location: [facility] ORS;  Service: General;  Laterality: Left;   thumb surg Right 2004   TONSILLECTOMY     age of 3    Family History  Problem Relation Age of Onset   Heart disease Father    Hypertension Sister    Cancer Mother        cervical   Cancer Other        ovarian,liver,colon cancer-no family member listed   Osteoporosis Paternal Aunt    Osteoporosis Paternal Grandmother     Social History:  reports that she has never smoked. She has never used smokeless tobacco. She reports that she does not drink alcohol and does  not use drugs.    Allergies:  Allergies  Allergen Reactions   Docetaxel Itching and Rash    Other reaction(s): edema   Sulfa Antibiotics Hives    Current Medications: Current Outpatient Medications  Medication Sig Dispense Refill   acetaminophen (TYLENOL) 500 MG tablet Take 1,500 mg by mouth every 4 (four) hours as needed for moderate pain.      amLODipine (NORVASC) 5 MG tablet Take 1 tablet (5 mg total) by mouth daily. 90 tablet 2   atorvastatin (LIPITOR) 10 MG tablet TAKE 1 TABLET EVERY DAY 90 tablet 0   Calcium Carb-Cholecalciferol (OYSTER SHELL CALCIUM + D PO) Take 1 tablet by mouth daily. 600mg  calcium plus 20 mg Vit D     Cholecalciferol (VITAMIN D-3) 25 MCG (1000 UT) CAPS Take 1,000 Units by mouth daily.      escitalopram (LEXAPRO) 10 MG tablet TAKE 1 TABLET EVERY DAY 90 tablet 0   hydrochlorothiazide  (HYDRODIURIL) 25 MG tablet TAKE 1 TABLET EVERY DAY 60 tablet 0   Influenza vac split quadrivalent PF (FLUZONE HIGH-DOSE) 0.5 ML injection      losartan (COZAAR) 100 MG tablet Take 1 tablet (100 mg total) by mouth daily. 30 tablet 3   metoprolol succinate (TOPROL-XL) 100 MG 24 hr tablet TAKE 2 TABLETS EVERY DAY 180 tablet 0   omeprazole (PRILOSEC) 20 MG capsule TAKE 1 CAPSULE EVERY DAY 90 capsule 3   pneumococcal 23 valent vaccine (PNEUMOVAX 23) 25 MCG/0.5ML injection      potassium chloride SA (KLOR-CON M) 20 MEQ tablet TAKE 1 TABLET EVERY DAY 90 tablet 0   traZODone (DESYREL) 50 MG tablet TAKE 1 TABLET EVERY DAY 90 tablet 0   triamcinolone ointment (KENALOG) 0.5 % APPLY TOPICALLY TO BILATER AL LOWER LEGS TWICE DAILY 90 g 3   warfarin (COUMADIN) 1 MG tablet Take 1 tablet by mouth daily. Takes 2mg  on Tue/Thurs & 1.5mg  on all other days     warfarin (COUMADIN) 2 MG tablet Take 2 mg by mouth as directed. 1.5 mg x 5 days and 2mg  x 2 days. (Takes 2mg  on Tue/Thurs & 1.5mg  on all other days )     No current facility-administered medications for this visit.   Review of Systems  Constitutional:  Positive for fatigue. Negative for appetite change, chills and fever.  HENT:   Negative for hearing loss and voice change.   Eyes:  Negative for eye problems.  Respiratory:  Negative for chest tightness and cough.   Cardiovascular:  Negative for chest pain.  Gastrointestinal:  Negative for abdominal distention, abdominal pain and blood in stool.  Endocrine: Negative for hot flashes.  Genitourinary:  Negative for difficulty urinating and frequency.   Musculoskeletal:  Positive for arthralgias.  Skin:  Negative for itching and rash.  Neurological:  Negative for extremity weakness.  Hematological:  Negative for adenopathy.  Psychiatric/Behavioral:  Negative for confusion.   Breast exam is performed in seated and lying down position. Patient is status post bilateral mastectomy.  No palpable chest wall  recurrence. No palpable bilateral axillary adenopathy    Physical Exam: Blood pressure 133/72, pulse 70, temperature 98.8 F (37.1 C), temperature source Tympanic, resp. rate 18, height 5\' 4"  (1.626 m), weight 234 lb (106.1 kg). Physical Exam Constitutional:      General: She is not in acute distress.    Appearance: She is not diaphoretic.     Comments: Morbid obese  HENT:     Head: Normocephalic and atraumatic.  Mouth/Throat:     Pharynx: No oropharyngeal exudate.  Eyes:     General: No scleral icterus.    Pupils: Pupils are equal, round, and reactive to light.  Cardiovascular:     Rate and Rhythm: Normal rate and regular rhythm.     Heart sounds: No murmur heard. Pulmonary:     Effort: Pulmonary effort is normal. No respiratory distress.  Abdominal:     General: There is no distension.     Palpations: Abdomen is soft.     Tenderness: There is no abdominal tenderness.  Musculoskeletal:        General: Normal range of motion.     Cervical back: Normal range of motion and neck supple.     Comments: Bilateral lower extremity 1+ edema, dermatitis  Skin:    General: Skin is warm and dry.     Findings: No erythema.  Neurological:     Mental Status: She is alert and oriented to person, place, and time.     Cranial Nerves: No cranial nerve deficit.  Psychiatric:        Mood and Affect: Affect normal.   No palpable chest wall mass at bilateral mastectomy sites.   RADIOGRAPHIC STUDIES: I have personally reviewed the radiological images as listed and agreed with the findings in the report. No results found.

## 2023-06-15 NOTE — Assessment & Plan Note (Signed)
#  Osteoporosis,continue calcium and vitamin D supplementation.   she declined repeating DEXA She previously declines bisphosphonate treatments.

## 2023-06-15 NOTE — Assessment & Plan Note (Signed)
Resolved. Recommend to stop oral iron supplementation

## 2023-06-15 NOTE — Progress Notes (Signed)
No concerns for the provider. 

## 2023-06-15 NOTE — Assessment & Plan Note (Signed)
#  01/24/2013 stage IIB (T2N1M0) right breast cancer s/p right mastectomy  ER/ PR+ and HER-2- finished letrozole x 5 years, stopped in 09/2018- BCI showed low benefit of extended therapy #11/2019  left DCIS, high-grade s/p left mastectomy Off letrozole due to lack of benefit from aromatase inhibitor with bilateral mastectomy.  Labs are reviewed and discussed with patient. Continue clinical surveillance.

## 2023-06-16 ENCOUNTER — Ambulatory Visit: Payer: Medicare HMO | Admitting: Oncology

## 2023-06-18 DIAGNOSIS — Z7901 Long term (current) use of anticoagulants: Secondary | ICD-10-CM | POA: Diagnosis not present

## 2023-07-05 ENCOUNTER — Other Ambulatory Visit: Payer: Self-pay | Admitting: Family Medicine

## 2023-07-05 DIAGNOSIS — E7800 Pure hypercholesterolemia, unspecified: Secondary | ICD-10-CM

## 2023-07-09 ENCOUNTER — Ambulatory Visit: Payer: Medicare HMO | Admitting: Family Medicine

## 2023-07-09 ENCOUNTER — Encounter: Payer: Self-pay | Admitting: Family Medicine

## 2023-07-09 DIAGNOSIS — R7309 Other abnormal glucose: Secondary | ICD-10-CM | POA: Diagnosis not present

## 2023-07-09 DIAGNOSIS — I10 Essential (primary) hypertension: Secondary | ICD-10-CM

## 2023-07-09 DIAGNOSIS — I272 Pulmonary hypertension, unspecified: Secondary | ICD-10-CM | POA: Diagnosis not present

## 2023-07-09 DIAGNOSIS — I4811 Longstanding persistent atrial fibrillation: Secondary | ICD-10-CM | POA: Diagnosis not present

## 2023-07-09 DIAGNOSIS — K2100 Gastro-esophageal reflux disease with esophagitis, without bleeding: Secondary | ICD-10-CM

## 2023-07-09 DIAGNOSIS — M8080XS Other osteoporosis with current pathological fracture, unspecified site, sequela: Secondary | ICD-10-CM

## 2023-07-09 DIAGNOSIS — Z853 Personal history of malignant neoplasm of breast: Secondary | ICD-10-CM | POA: Diagnosis not present

## 2023-07-09 DIAGNOSIS — G4700 Insomnia, unspecified: Secondary | ICD-10-CM

## 2023-07-09 DIAGNOSIS — I5031 Acute diastolic (congestive) heart failure: Secondary | ICD-10-CM

## 2023-07-09 DIAGNOSIS — E7800 Pure hypercholesterolemia, unspecified: Secondary | ICD-10-CM | POA: Diagnosis not present

## 2023-07-09 DIAGNOSIS — F419 Anxiety disorder, unspecified: Secondary | ICD-10-CM

## 2023-07-09 LAB — POCT GLYCOSYLATED HEMOGLOBIN (HGB A1C): HbA1c POC (<> result, manual entry): 5.1 % (ref 4.0–5.6)

## 2023-07-09 MED ORDER — VALSARTAN-HYDROCHLOROTHIAZIDE 320-12.5 MG PO TABS: 1.0000 | ORAL_TABLET | Freq: Every day | ORAL | 3 refills | Status: DC

## 2023-07-09 MED ORDER — ATORVASTATIN CALCIUM 10 MG PO TABS
10.0000 mg | ORAL_TABLET | Freq: Every day | ORAL | 3 refills | Status: DC
Start: 2023-07-09 — End: 2024-07-08

## 2023-07-09 MED ORDER — AMLODIPINE BESYLATE 10 MG PO TABS: 10.0000 mg | ORAL_TABLET | Freq: Every day | ORAL | 3 refills | Status: DC

## 2023-07-09 MED ORDER — POTASSIUM CHLORIDE CRYS ER 20 MEQ PO TBCR: 20.0000 meq | EXTENDED_RELEASE_TABLET | Freq: Every day | ORAL | 3 refills | Status: DC

## 2023-07-09 MED ORDER — OMEPRAZOLE 20 MG PO CPDR: 20.0000 mg | DELAYED_RELEASE_CAPSULE | Freq: Every day | ORAL | 3 refills | Status: DC

## 2023-07-09 MED ORDER — METOPROLOL SUCCINATE ER 100 MG PO TB24: 200.0000 mg | ORAL_TABLET | Freq: Every day | ORAL | 3 refills | Status: DC

## 2023-07-09 MED ORDER — VALSARTAN-HYDROCHLOROTHIAZIDE 320-12.5 MG PO TABS
1.0000 | ORAL_TABLET | Freq: Every day | ORAL | 3 refills | Status: DC
Start: 2023-07-09 — End: 2024-09-21

## 2023-07-09 MED ORDER — ESCITALOPRAM OXALATE 10 MG PO TABS: 10.0000 mg | ORAL_TABLET | Freq: Every day | ORAL | 3 refills | Status: DC

## 2023-07-09 MED ORDER — POTASSIUM CHLORIDE CRYS ER 20 MEQ PO TBCR
20.0000 meq | EXTENDED_RELEASE_TABLET | Freq: Every day | ORAL | 3 refills | Status: DC
Start: 2023-07-09 — End: 2024-10-03

## 2023-07-09 MED ORDER — TRAZODONE HCL 50 MG PO TABS
50.0000 mg | ORAL_TABLET | Freq: Every day | ORAL | 3 refills | Status: AC
Start: 2023-07-09 — End: ?

## 2023-07-09 MED ORDER — LOSARTAN POTASSIUM 100 MG PO TABS: 100.0000 mg | ORAL_TABLET | Freq: Every day | ORAL | 3 refills | Status: DC

## 2023-07-09 MED ORDER — METOPROLOL SUCCINATE ER 100 MG PO TB24
200.0000 mg | ORAL_TABLET | Freq: Every day | ORAL | 3 refills | Status: DC
Start: 2023-07-09 — End: 2024-10-03

## 2023-07-09 MED ORDER — HYDROCHLOROTHIAZIDE 25 MG PO TABS
25.0000 mg | ORAL_TABLET | Freq: Every day | ORAL | 0 refills | Status: DC
Start: 2023-07-09 — End: 2023-07-09

## 2023-07-09 MED ORDER — TRAZODONE HCL 50 MG PO TABS
50.0000 mg | ORAL_TABLET | Freq: Every day | ORAL | 3 refills | Status: DC
Start: 2023-07-09 — End: 2023-07-09

## 2023-07-09 MED ORDER — ESCITALOPRAM OXALATE 10 MG PO TABS
10.0000 mg | ORAL_TABLET | Freq: Every day | ORAL | 3 refills | Status: AC
Start: 2023-07-09 — End: ?

## 2023-07-09 MED ORDER — AMLODIPINE BESYLATE 10 MG PO TABS
10.0000 mg | ORAL_TABLET | Freq: Every day | ORAL | 3 refills | Status: DC
Start: 2023-07-09 — End: 2024-10-03

## 2023-07-09 MED ORDER — OMEPRAZOLE 20 MG PO CPDR
20.0000 mg | DELAYED_RELEASE_CAPSULE | Freq: Every day | ORAL | 3 refills | Status: DC
Start: 2023-07-09 — End: 2024-10-03

## 2023-07-09 NOTE — Assessment & Plan Note (Signed)
Chronic; irritable  Notes frustrating regarding pay her home off and increasing taxes on property Has not been to the beach and is renting it out to assist with meeting these goals Declines further assistance

## 2023-07-09 NOTE — Assessment & Plan Note (Signed)
Chronic; stable Request for additional trazodone to assist

## 2023-07-09 NOTE — Patient Instructions (Signed)
Replace Losartan 100 &Hydrochlorothiazide 25 with new blood pressure medication diovan 320-12.5 Continue norvasc 10 Please call and schedule your mammogram and bone density screening   [facility]  [address] [facility] [place],  [place]  [address] Get Driving Directions Main: [phone]  Sunday:Closed Monday:7:20 AM - 5:00 PM Tuesday:7:20 AM - 5:00 PM Wednesday:7:20 AM - 5:00 PM Thursday:7:20 AM - 5:00 PM Friday:7:20 AM - 4:30 PM Saturday:Closed

## 2023-07-09 NOTE — Assessment & Plan Note (Signed)
Chronic; stable F/b cards on daily AC Remains on BB

## 2023-07-09 NOTE — Progress Notes (Signed)
Established patient visit   Patient: Erin Romero   DOB: 05-01-1951   72 y.o. Female  MRN: [id number] Visit Date: 07/09/2023  Today's healthcare provider: Jacky Kindle, FNP  Introduced to nurse practitioner role and practice setting.  All questions answered.  Discussed provider/patient relationship and expectations.  Chief Complaint  Patient presents with   Follow-up   Subjective    HPI HPI   Patient states she was told to follow up on her medications. Patient says she would like the provider to know that she recently had Pneumonia in November of last year and says she spent four nights in the hospital. Last edited by Malen Gauze, CMA on 07/09/2023 10:33 AM.      Medications: Outpatient Medications Prior to Visit  Medication Sig   Calcium Carb-Cholecalciferol (OYSTER SHELL CALCIUM + D PO) Take 1 tablet by mouth daily. 600mg  calcium plus 20 mg Vit D   Cholecalciferol (VITAMIN D-3) 25 MCG (1000 UT) CAPS Take 1,000 Units by mouth daily.    Influenza vac split quadrivalent PF (FLUZONE HIGH-DOSE) 0.5 ML injection    pneumococcal 23 valent vaccine (PNEUMOVAX 23) 25 MCG/0.5ML injection    triamcinolone ointment (KENALOG) 0.5 % APPLY TOPICALLY TO BILATER AL LOWER LEGS TWICE DAILY   warfarin (COUMADIN) 1 MG tablet Take 1 tablet by mouth daily. Takes 2mg  on Tue/Thurs & 1.5mg  on all other days   warfarin (COUMADIN) 2 MG tablet Take 2 mg by mouth as directed. 1.5 mg x 5 days and 2mg  x 2 days. (Takes 2mg  on Tue/Thurs & 1.5mg  on all other days )   [DISCONTINUED] acetaminophen (TYLENOL) 500 MG tablet Take 1,500 mg by mouth every 4 (four) hours as needed for moderate pain.    [DISCONTINUED] amLODipine (NORVASC) 5 MG tablet Take 1 tablet (5 mg total) by mouth daily.   [DISCONTINUED] atorvastatin (LIPITOR) 10 MG tablet TAKE 1 TABLET EVERY DAY   [DISCONTINUED] escitalopram (LEXAPRO) 10 MG tablet TAKE 1 TABLET EVERY DAY   [DISCONTINUED] hydrochlorothiazide (HYDRODIURIL) 25 MG tablet  TAKE 1 TABLET EVERY DAY   [DISCONTINUED] losartan (COZAAR) 100 MG tablet Take 1 tablet (100 mg total) by mouth daily.   [DISCONTINUED] metoprolol succinate (TOPROL-XL) 100 MG 24 hr tablet TAKE 2 TABLETS EVERY DAY   [DISCONTINUED] omeprazole (PRILOSEC) 20 MG capsule TAKE 1 CAPSULE EVERY DAY   [DISCONTINUED] potassium chloride SA (KLOR-CON M) 20 MEQ tablet TAKE 1 TABLET EVERY DAY   [DISCONTINUED] traZODone (DESYREL) 50 MG tablet TAKE 1 TABLET EVERY DAY   No facility-administered medications prior to visit.    Review of Systems    Objective    BP (!) 142/73   Pulse 65   Wt 228 lb (103.4 kg)   SpO2 98%   BMI 39.14 kg/m   Physical Exam Vitals and nursing note reviewed.  Constitutional:      General: She is not in acute distress.    Appearance: Normal appearance. She is obese. She is not ill-appearing, toxic-appearing or diaphoretic.  HENT:     Head: Normocephalic and atraumatic.  Cardiovascular:     Rate and Rhythm: Normal rate and regular rhythm.     Pulses: Normal pulses.     Heart sounds: Normal heart sounds. No murmur heard.    No friction rub. No gallop.  Pulmonary:     Effort: Pulmonary effort is normal. No respiratory distress.     Breath sounds: Normal breath sounds. No stridor. No wheezing, rhonchi or rales.  Chest:  Chest wall: No tenderness.  Abdominal:     General: Bowel sounds are normal.     Palpations: Abdomen is soft.  Musculoskeletal:        General: No swelling, tenderness, deformity or signs of injury. Normal range of motion.     Right lower leg: 2+ Pitting Edema present.     Left lower leg: 2+ Pitting Edema present.  Skin:    General: Skin is warm and dry.     Capillary Refill: Capillary refill takes less than 2 seconds.     Coloration: Skin is not jaundiced or pale.     Findings: No bruising, erythema, lesion or rash.  Neurological:     General: No focal deficit present.     Mental Status: She is alert and oriented to person, place, and time.  Mental status is at baseline.     Cranial Nerves: No cranial nerve deficit.     Sensory: No sensory deficit.     Motor: No weakness.     Coordination: Coordination normal.  Psychiatric:        Mood and Affect: Mood normal.        Behavior: Behavior normal.        Thought Content: Thought content normal.        Judgment: Judgment normal.      No results found for any visits on 07/09/23.  Assessment & Plan     Problem List Items Addressed This Visit       Cardiovascular and Mediastinum   A-fib (HCC)    Chronic; stable F/b cards on daily AC Remains on BB      Relevant Medications   amLODipine (NORVASC) 10 MG tablet   atorvastatin (LIPITOR) 10 MG tablet   metoprolol succinate (TOPROL-XL) 100 MG 24 hr tablet   valsartan-hydrochlorothiazide (DIOVAN-HCT) 320-12.5 MG tablet   Acute diastolic (congestive) heart failure (HCC)    Chronic; stable Working on weight mgmt +LE edema and BP elevation       Relevant Medications   amLODipine (NORVASC) 10 MG tablet   atorvastatin (LIPITOR) 10 MG tablet   metoprolol succinate (TOPROL-XL) 100 MG 24 hr tablet   potassium chloride SA (KLOR-CON M) 20 MEQ tablet   valsartan-hydrochlorothiazide (DIOVAN-HCT) 320-12.5 MG tablet   Moderate pulmonary hypertension (HCC)    Chronic; stable Endorses LE edema However, no complaints of SOB or DOE       Relevant Medications   amLODipine (NORVASC) 10 MG tablet   atorvastatin (LIPITOR) 10 MG tablet   metoprolol succinate (TOPROL-XL) 100 MG 24 hr tablet   valsartan-hydrochlorothiazide (DIOVAN-HCT) 320-12.5 MG tablet   Primary hypertension    Chronic; uncontrolled Titrate ARB to assist  Recommend close f/u Increase losartan to diovan Lower hydrochlorothiazide to prevent dehydration s/s age Goal 129/79      Relevant Medications   amLODipine (NORVASC) 10 MG tablet   atorvastatin (LIPITOR) 10 MG tablet   metoprolol succinate (TOPROL-XL) 100 MG 24 hr tablet   valsartan-hydrochlorothiazide  (DIOVAN-HCT) 320-12.5 MG tablet     Digestive   Gastroesophageal reflux disease with esophagitis without hemorrhage    Chronic; stable Request to stay on PPI given hx of schatzki's ring  Aware of risks/benefits given long term use      Relevant Medications   omeprazole (PRILOSEC) 20 MG capsule     Musculoskeletal and Integument   Osteoporosis (Chronic)    Chronic; unknown Repeat DEXA Continue to recommend weight bearing exercise       Relevant Orders  DG Bone Density     Other   History of breast cancer (Chronic)    Due for repeat mammo      Relevant Orders   MM 3D SCREENING MAMMOGRAM BILATERAL BREAST   Anxiety    Chronic; irritable  Notes frustrating regarding pay her home off and increasing taxes on property Has not been to the beach and is renting it out to assist with meeting these goals Declines further assistance      Relevant Medications   escitalopram (LEXAPRO) 10 MG tablet   traZODone (DESYREL) 50 MG tablet   Elevated glucose    Recommend A1c; no prediabetes noted Continue to recommend balanced, lower carb meals. Smaller meal size, adding snacks. Choosing water as drink of choice and increasing purposeful exercise.       Relevant Orders   POCT HgB A1C   Hypercholesteremia    Chronic; in setting of pAH as well as CHF and HTN/Obesity Previously stable with use of statin recommend diet low in saturated fat and regular exercise - 30 min at least 5 times per week       Relevant Medications   amLODipine (NORVASC) 10 MG tablet   atorvastatin (LIPITOR) 10 MG tablet   metoprolol succinate (TOPROL-XL) 100 MG 24 hr tablet   valsartan-hydrochlorothiazide (DIOVAN-HCT) 320-12.5 MG tablet   Insomnia    Chronic; stable Request for additional trazodone to assist      Relevant Medications   traZODone (DESYREL) 50 MG tablet   Morbid obesity (HCC) - Primary    Chronic; slight improvement Body mass index is 39.14 kg/m. Associated with HTN, HLD, CHF,       Return in about 6 weeks (around 08/20/2023) for blood pressure check, HTN management.     Leilani Merl, FNP, have reviewed all documentation for this visit. The documentation on 07/09/23 for the exam, diagnosis, procedures, and orders are all accurate and complete.  Jacky Kindle, FNP  [facility] [phone] (phone) [phone] (fax)  [facility]

## 2023-07-09 NOTE — Assessment & Plan Note (Signed)
Chronic; stable Endorses LE edema However, no complaints of SOB or DOE

## 2023-07-09 NOTE — Assessment & Plan Note (Signed)
Chronic; stable Request to stay on PPI given hx of schatzki's ring  Aware of risks/benefits given long term use

## 2023-07-09 NOTE — Assessment & Plan Note (Signed)
Due for repeat mammo. 

## 2023-07-09 NOTE — Assessment & Plan Note (Signed)
Chronic; slight improvement Body mass index is 39.14 kg/m. Associated with HTN, HLD, CHF,

## 2023-07-09 NOTE — Assessment & Plan Note (Signed)
Chronic; uncontrolled Titrate ARB to assist  Recommend close f/u Increase losartan to diovan Lower hydrochlorothiazide to prevent dehydration s/s age Goal 129/79

## 2023-07-09 NOTE — Assessment & Plan Note (Signed)
Recommend A1c; no prediabetes noted Continue to recommend balanced, lower carb meals. Smaller meal size, adding snacks. Choosing water as drink of choice and increasing purposeful exercise.

## 2023-07-09 NOTE — Assessment & Plan Note (Signed)
Chronic; in setting of pAH as well as CHF and HTN/Obesity Previously stable with use of statin recommend diet low in saturated fat and regular exercise - 30 min at least 5 times per week

## 2023-07-09 NOTE — Assessment & Plan Note (Signed)
Chronic; unknown Repeat DEXA Continue to recommend weight bearing exercise

## 2023-07-09 NOTE — Assessment & Plan Note (Signed)
Chronic; stable Working on weight mgmt +LE edema and BP elevation

## 2023-07-16 ENCOUNTER — Telehealth: Payer: Self-pay | Admitting: Family Medicine

## 2023-07-16 NOTE — Telephone Encounter (Signed)
Pt is calling in because she says her [facility] is requesting additional information regarding the prescription valsartan-hydrochlorothiazide (DIOVAN-HCT) 320-12.5 MG tablet [[id number]] . Pt says they sent her a letter stating they needed something from the doctor but pt isn't sure what it is they are requesting. Please follow up with pt.

## 2023-07-20 ENCOUNTER — Telehealth: Payer: Self-pay

## 2023-07-20 NOTE — Telephone Encounter (Signed)
error 

## 2023-07-20 NOTE — Telephone Encounter (Deleted)
Patient was advised to come into the office due to symptoms of UTI. Patient stated she went to Urgent Care and was dx with

## 2023-07-21 DIAGNOSIS — Z7901 Long term (current) use of anticoagulants: Secondary | ICD-10-CM | POA: Diagnosis not present

## 2023-08-05 DIAGNOSIS — H35033 Hypertensive retinopathy, bilateral: Secondary | ICD-10-CM | POA: Diagnosis not present

## 2023-08-05 DIAGNOSIS — H25813 Combined forms of age-related cataract, bilateral: Secondary | ICD-10-CM | POA: Diagnosis not present

## 2023-08-05 DIAGNOSIS — H524 Presbyopia: Secondary | ICD-10-CM | POA: Diagnosis not present

## 2023-08-20 ENCOUNTER — Ambulatory Visit (INDEPENDENT_AMBULATORY_CARE_PROVIDER_SITE_OTHER): Payer: Medicare HMO | Admitting: Family Medicine

## 2023-08-20 ENCOUNTER — Encounter: Payer: Self-pay | Admitting: Family Medicine

## 2023-08-20 VITALS — BP 138/76 | HR 71 | Temp 97.6°F | Resp 16 | Ht 64.0 in | Wt 237.3 lb

## 2023-08-20 DIAGNOSIS — I10 Essential (primary) hypertension: Secondary | ICD-10-CM | POA: Diagnosis not present

## 2023-08-20 DIAGNOSIS — I4811 Longstanding persistent atrial fibrillation: Secondary | ICD-10-CM | POA: Diagnosis not present

## 2023-08-20 DIAGNOSIS — Z23 Encounter for immunization: Secondary | ICD-10-CM | POA: Diagnosis not present

## 2023-08-20 DIAGNOSIS — I5031 Acute diastolic (congestive) heart failure: Secondary | ICD-10-CM

## 2023-08-20 DIAGNOSIS — Z7901 Long term (current) use of anticoagulants: Secondary | ICD-10-CM | POA: Diagnosis not present

## 2023-08-20 DIAGNOSIS — I272 Pulmonary hypertension, unspecified: Secondary | ICD-10-CM

## 2023-08-20 NOTE — Progress Notes (Signed)
Established patient visit  Patient: Erin Romero   DOB: [birth year]   72 y.o. Female  MRN: [id number] Visit Date: 08/20/2023  Today's healthcare provider: Jacky Kindle, FNP  Introduced to nurse practitioner role and practice setting.  All questions answered.  Discussed provider/patient relationship and expectations.  Subjective    HPI HPI   Patient reports good compliance and tolerance to medications. She is not checking BP at home.  Last edited by Myles Lipps, CMA on 08/20/2023 10:42 AM.      Medications: Outpatient Medications Prior to Visit  Medication Sig   amLODipine (NORVASC) 10 MG tablet Take 1 tablet (10 mg total) by mouth daily.   atorvastatin (LIPITOR) 10 MG tablet Take 1 tablet (10 mg total) by mouth daily.   Calcium Carb-Cholecalciferol (OYSTER SHELL CALCIUM + D PO) Take 1 tablet by mouth daily. 600mg  calcium plus 20 mg Vit D   Cholecalciferol (VITAMIN D-3) 25 MCG (1000 UT) CAPS Take 1,000 Units by mouth daily.    escitalopram (LEXAPRO) 10 MG tablet Take 1 tablet (10 mg total) by mouth daily.   metoprolol succinate (TOPROL-XL) 100 MG 24 hr tablet Take 2 tablets (200 mg total) by mouth daily.   omeprazole (PRILOSEC) 20 MG capsule Take 1 capsule (20 mg total) by mouth daily.   potassium chloride SA (KLOR-CON M) 20 MEQ tablet Take 1 tablet (20 mEq total) by mouth daily.   traZODone (DESYREL) 50 MG tablet Take 1 tablet (50 mg total) by mouth daily.   triamcinolone ointment (KENALOG) 0.5 % APPLY TOPICALLY TO BILATER AL LOWER LEGS TWICE DAILY   valsartan-hydrochlorothiazide (DIOVAN-HCT) 320-12.5 MG tablet Take 1 tablet by mouth daily.   warfarin (COUMADIN) 1 MG tablet Take 1 tablet by mouth daily. Takes 2mg  on Tue/Thurs & 1.5mg  on all other days   warfarin (COUMADIN) 2 MG tablet Take 2 mg by mouth as directed. 1.5 mg x 5 days and 2mg  x 2 days. (Takes 2mg  on Tue/Thurs & 1.5mg  on all other days )   [DISCONTINUED] Influenza vac split quadrivalent PF (FLUZONE  HIGH-DOSE) 0.5 ML injection    [DISCONTINUED] pneumococcal 23 valent vaccine (PNEUMOVAX 23) 25 MCG/0.5ML injection    No facility-administered medications prior to visit.     Objective    BP 138/76 (BP Location: Left Arm, Patient Position: Bed low/side rails up, Cuff Size: Large)   Pulse 71   Temp 97.6 F (36.4 C) (Temporal)   Resp 16   Ht 5\' 4"  (1.626 m)   Wt 237 lb 4.8 oz (107.6 kg)   SpO2 96%   BMI 40.73 kg/m   Physical Exam Vitals and nursing note reviewed.  Constitutional:      General: She is not in acute distress.    Appearance: Normal appearance. She is obese. She is not ill-appearing, toxic-appearing or diaphoretic.  HENT:     Head: Normocephalic and atraumatic.  Cardiovascular:     Rate and Rhythm: Normal rate and regular rhythm.     Pulses: Normal pulses.     Heart sounds: Normal heart sounds. No murmur heard.    No friction rub. No gallop.  Pulmonary:     Effort: Pulmonary effort is normal. No respiratory distress.     Breath sounds: Normal breath sounds. No stridor. No wheezing, rhonchi or rales.  Chest:     Chest wall: No tenderness.  Musculoskeletal:        General: No swelling, tenderness, deformity or signs of injury. Normal range of motion.  Right lower leg: No edema.     Left lower leg: No edema.  Skin:    General: Skin is warm and dry.     Capillary Refill: Capillary refill takes less than 2 seconds.     Coloration: Skin is not jaundiced or pale.     Findings: No bruising, erythema, lesion or rash.  Neurological:     General: No focal deficit present.     Mental Status: She is alert and oriented to person, place, and time. Mental status is at baseline.     Cranial Nerves: No cranial nerve deficit.     Sensory: No sensory deficit.     Motor: No weakness.     Coordination: Coordination normal.  Psychiatric:        Mood and Affect: Mood normal.        Behavior: Behavior normal.        Thought Content: Thought content normal.        Judgment:  Judgment normal.    No results found for any visits on 08/20/23.  Assessment & Plan     Problem List Items Addressed This Visit       Cardiovascular and Mediastinum   Primary hypertension - Primary    Chronic, improved Continue medications -norvasc 10, toprol xl 200 mg (100 mg x2), diovan-hct 320-12.5         Other   Morbid obesity (HCC)    Chronic, Body mass index is 40.73 kg/m. Weight gain noted in past 6 weeks Pt notes ongoing depression related financial costs and prefers to not leave the house and has not been exercising or eating well Continue to recommend balanced, lower carb meals. Smaller meal size, adding snacks. Choosing water as drink of choice and increasing purposeful exercise.       Other Visit Diagnoses     Encounter for immunization       Relevant Orders   Flu Vaccine Trivalent High Dose (Fluad) (Completed)   Pneumococcal conjugate vaccine 20-valent (Completed)      No follow-ups on file.     Leilani Merl, FNP, have reviewed all documentation for this visit. The documentation on 08/20/23 for the exam, diagnosis, procedures, and orders are all accurate and complete.  Jacky Kindle, FNP  [facility] [phone] (phone) [phone] (fax)  [facility]

## 2023-08-20 NOTE — Assessment & Plan Note (Signed)
Chronic, improved Continue medications -norvasc 10, toprol xl 200 mg (100 mg x2), diovan-hct 320-12.5

## 2023-08-20 NOTE — Assessment & Plan Note (Signed)
Chronic, Body mass index is 40.73 kg/m. Weight gain noted in past 6 weeks Pt notes ongoing depression related financial costs and prefers to not leave the house and has not been exercising or eating well Continue to recommend balanced, lower carb meals. Smaller meal size, adding snacks. Choosing water as drink of choice and increasing purposeful exercise.

## 2023-08-21 DIAGNOSIS — I10 Essential (primary) hypertension: Secondary | ICD-10-CM | POA: Diagnosis not present

## 2023-08-21 DIAGNOSIS — H25013 Cortical age-related cataract, bilateral: Secondary | ICD-10-CM | POA: Diagnosis not present

## 2023-08-21 DIAGNOSIS — H2513 Age-related nuclear cataract, bilateral: Secondary | ICD-10-CM | POA: Diagnosis not present

## 2023-08-21 DIAGNOSIS — H2512 Age-related nuclear cataract, left eye: Secondary | ICD-10-CM | POA: Diagnosis not present

## 2023-08-21 DIAGNOSIS — H25043 Posterior subcapsular polar age-related cataract, bilateral: Secondary | ICD-10-CM | POA: Diagnosis not present

## 2023-09-16 ENCOUNTER — Ambulatory Visit: Payer: Medicare HMO

## 2023-09-16 ENCOUNTER — Other Ambulatory Visit: Payer: Self-pay | Admitting: Family Medicine

## 2023-09-16 VITALS — Ht 64.0 in | Wt 233.0 lb

## 2023-09-16 DIAGNOSIS — Z0000 Encounter for general adult medical examination without abnormal findings: Secondary | ICD-10-CM

## 2023-09-16 NOTE — Progress Notes (Signed)
Subjective:   Erin Romero is a 72 y.o. female who presents for Medicare Annual (Subsequent) preventive examination.  Visit Complete: Virtual  I connected with  Hulen Shouts on 09/16/23 by a audio enabled telemedicine application and verified that I am speaking with the correct person using two identifiers.  Patient Location: Home  Provider Location: Home Office  I discussed the limitations of evaluation and management by telemedicine. The patient expressed understanding and agreed to proceed.  Because this visit was a virtual/telehealth visit, some criteria may be missing or patient reported. Any vitals not documented were not able to be obtained and vitals that have been documented are patient reported.    Patient Medicare AWV questionnaire was completed by the patient on (not done); I have confirmed that all information answered by patient is correct and no changes since this date. Cardiac Risk Factors include: advanced age (>21men, >9 women);dyslipidemia;hypertension;sedentary lifestyle;obesity (BMI >30kg/m2)    Objective:    Today's Vitals   09/16/23 1113  Weight: 233 lb (105.7 kg)  Height: 5\' 4"  (1.626 m)   Body mass index is 39.99 kg/m.     09/16/2023   11:26 AM 06/15/2023   10:30 AM 10/25/2022    4:35 PM 09/10/2022   10:40 AM 06/11/2022    1:48 PM 12/11/2021    1:32 PM 09/08/2021   11:07 AM  Advanced Directives  Does Patient Have a Medical Advance Directive? Yes Yes No No Yes Yes Yes  Type of Estate agent of [place];Living will Healthcare Power of [place];Living will    Living will;Healthcare Power of Attorney Living will  Does patient want to make changes to medical advance directive?       No - Patient declined  Would patient like information on creating a medical advance directive?   No - Patient declined No - Patient declined       Current Medications (verified) Outpatient Encounter Medications as of 09/16/2023  Medication  Sig   amLODipine (NORVASC) 10 MG tablet Take 1 tablet (10 mg total) by mouth daily.   atorvastatin (LIPITOR) 10 MG tablet Take 1 tablet (10 mg total) by mouth daily.   Calcium Carb-Cholecalciferol (OYSTER SHELL CALCIUM + D PO) Take 1 tablet by mouth daily. 600mg  calcium plus 20 mg Vit D   Cholecalciferol (VITAMIN D-3) 25 MCG (1000 UT) CAPS Take 1,000 Units by mouth daily.    escitalopram (LEXAPRO) 10 MG tablet Take 1 tablet (10 mg total) by mouth daily.   metoprolol succinate (TOPROL-XL) 100 MG 24 hr tablet Take 2 tablets (200 mg total) by mouth daily.   omeprazole (PRILOSEC) 20 MG capsule Take 1 capsule (20 mg total) by mouth daily.   potassium chloride SA (KLOR-CON M) 20 MEQ tablet Take 1 tablet (20 mEq total) by mouth daily.   traZODone (DESYREL) 50 MG tablet Take 1 tablet (50 mg total) by mouth daily.   triamcinolone ointment (KENALOG) 0.5 % APPLY TOPICALLY TO BILATER AL LOWER LEGS TWICE DAILY   valsartan-hydrochlorothiazide (DIOVAN-HCT) 320-12.5 MG tablet Take 1 tablet by mouth daily.   warfarin (COUMADIN) 1 MG tablet Take 1 tablet by mouth daily. Takes 2mg  on Tue/Thurs & 1.5mg  on all other days   warfarin (COUMADIN) 2 MG tablet Take 2 mg by mouth as directed. 1.5 mg x 5 days and 2mg  x 2 days. (Takes 2mg  on Tue/Thurs & 1.5mg  on all other days )   No facility-administered encounter medications on file as of 09/16/2023.    Allergies (verified)  Docetaxel and Sulfa antibiotics   History: Past Medical History:  Diagnosis Date   Anxiety    Atrial fibrillation (HCC) 2014   Breast cancer (HCC) 2014   right breast   Dysrhythmia    Fibrocystic breast disease 2005   GERD (gastroesophageal reflux disease)    Hypertension 2005   Lump or mass in breast 2014   right mastectomy   Malignant neoplasm of central portion of female breast (HCC) 01/24/2013   Stage II,T2, N1a. ER/PR 90%, HER-2/neu: Not overexpressed. Mastectomy with sentinel node biopsy. 2 sentinel nodes negative, non-sentinel node  w/ 5 mm macrometastatic foci in non-sentinel node.  Received post mastectomy radiation.    Past Surgical History:  Procedure Laterality Date   ABDOMINAL HYSTERECTOMY  1999   BREAST BIOPSY Left 2015   BENIGN FIBROTIC BREAST TISSUE WITH CLUSTERED APOCRINE   BREAST BIOPSY Left 11/02/2019   affirm bx, x marker, path pending   BREAST SURGERY Right 2005,2014   biopsy   BREAST SURGERY Right 2014   mastectomy   COLONOSCOPY  2002   COLONOSCOPY WITH PROPOFOL N/A 10/16/2015   Procedure: COLONOSCOPY WITH PROPOFOL;  Surgeon: Earline Mayotte, MD;  Location: [facility] ENDOSCOPY;  Service: Endoscopy;  Laterality: N/A;   COLONOSCOPY WITH PROPOFOL N/A 10/31/2020   Procedure: COLONOSCOPY WITH PROPOFOL;  Surgeon: Pasty Spillers, MD;  Location: [facility] ENDOSCOPY;  Service: Endoscopy;  Laterality: N/A;   ESOPHAGOGASTRODUODENOSCOPY (EGD) WITH PROPOFOL N/A 10/31/2020   Procedure: ESOPHAGOGASTRODUODENOSCOPY (EGD) WITH PROPOFOL;  Surgeon: Pasty Spillers, MD;  Location: [facility] ENDOSCOPY;  Service: Endoscopy;  Laterality: N/A;   GIVENS CAPSULE STUDY N/A 12/27/2020   Procedure: GIVENS CAPSULE STUDY;  Surgeon: Pasty Spillers, MD;  Location: [facility] ENDOSCOPY;  Service: Endoscopy;  Laterality: N/A;   KNEE SURGERY Left 2005   MASTECTOMY Right 2014   radiation chemo   SIMPLE MASTECTOMY WITH AXILLARY SENTINEL NODE BIOPSY Left 11/30/2019   Procedure: SIMPLE MASTECTOMY WITH AXILLARY SENTINEL NODE BIOPSY;  Surgeon: Earline Mayotte, MD;  Location: [facility] ORS;  Service: General;  Laterality: Left;   thumb surg Right 2004   TONSILLECTOMY     age of 3   Family History  Problem Relation Age of Onset   Heart disease Father    Hypertension Sister    Cancer Mother        cervical   Cancer Other        ovarian,liver,colon cancer-no family member listed   Osteoporosis Paternal Aunt    Osteoporosis Paternal Grandmother    Social History   Socioeconomic History   Marital status: Widowed    Spouse name: Alexina Kerestes   Number of children: 0   Years of education: 12   Highest education level: 12th grade  Occupational History   Occupation: retired  Tobacco Use   Smoking status: Never   Smokeless tobacco: Never  Vaping Use   Vaping status: Never Used  Substance and Sexual Activity   Alcohol use: No   Drug use: No   Sexual activity: Not Currently  Other Topics Concern   Not on file  Social History Narrative   Not on file   Social Determinants of Health   Financial Resource Strain: Medium Risk (09/16/2023)   Overall Financial Resource Strain (CARDIA)    Difficulty of Paying Living Expenses: Somewhat hard  Food Insecurity: No Food Insecurity (09/16/2023)   Hunger Vital Sign    Worried About Running Out of Food in the Last Year: Never true    Ran Out of  Food in the Last Year: Never true  Transportation Needs: No Transportation Needs (09/16/2023)   PRAPARE - Administrator, Civil Service (Medical): No    Lack of Transportation (Non-Medical): No  Physical Activity: Inactive (09/16/2023)   Exercise Vital Sign    Days of Exercise per Week: 0 days    Minutes of Exercise per Session: 0 min  Stress: Stress Concern Present (09/16/2023)   [employer] of Occupational Health - Occupational Stress Questionnaire    Feeling of Stress : To some extent  Social Connections: Socially Isolated (09/16/2023)   Social Connection and Isolation Panel [NHANES]    Frequency of Communication with Friends and Family: Twice a week    Frequency of Social Gatherings with Friends and Family: Twice a week    Attends Religious Services: Never    Database administrator or Organizations: No    Attends Banker Meetings: Never    Marital Status: Widowed    Tobacco Counseling Counseling given: Not Answered   Clinical Intake:  Pre-visit preparation completed: Yes  Pain : No/denies pain     BMI - recorded: 39.99 Nutritional Status: BMI > 30  Obese Nutritional Risks: None (scabs pt  picks [employer]:[id number] stays oprn) Diabetes: No  How often do you need to have someone help you when you read instructions, pamphlets, or other written materials from your doctor or pharmacy?: 1 - Never  Interpreter Needed?: No  Comments: lives alone Information entered by :: B.Retal Tonkinson,LPN   Activities of Daily Living    09/16/2023   11:26 AM 07/09/2023   11:09 AM  In your present state of health, do you have any difficulty performing the following activities:  Hearing? 0 0  Vision? 1 1  Difficulty concentrating or making decisions? 0 0  Walking or climbing stairs? 1 1  Dressing or bathing? 0 0  Doing errands, shopping? 0 0  Preparing Food and eating ? N   Using the Toilet? N   In the past six months, have you accidently leaked urine? Y   Do you have problems with loss of bowel control? N   Managing your Medications? N   Managing your Finances? N   Housekeeping or managing your Housekeeping? N     Patient Care Team: Jacky Kindle, FNP as PCP - General (Family Medicine) Lemar Livings Merrily Pew, MD as Consulting Physician (General Surgery) Lady Gary Darlin Priestly, MD as Consulting Physician (Cardiology) Rickard Patience, MD as Consulting Physician (Oncology) Thurnell Garbe, OD (Optometry)  Indicate any recent Medical Services you may have received from other than [facility] providers in the past year (date may be approximate).     Assessment:   This is a routine wellness examination for [place].  Hearing/Vision screen Hearing Screening - Comments:: Pt says her hearing in good Vision Screening - Comments:: Pt says vision is alright but limited distance vision. Cataract surgery in November [facility]   Goals Addressed             This Visit's Progress    DIET - EAT MORE FRUITS AND VEGETABLES   Not on track    DIET - REDUCE SUGAR INTAKE   Not on track    Recommend cutting out sugar and ice cream from daily diet and substituting for sugar free snacks.      Exercise 150 minutes per week  (moderate activity)   Not on track    Increase water intake   Not on track    Recommend increasing water  intake to 4 glasses of water a day.        Depression Screen    09/16/2023   11:22 AM 08/20/2023   10:33 AM 07/09/2023   11:09 AM 07/09/2023   10:34 AM 11/05/2022    2:48 PM 09/10/2022   10:38 AM 02/27/2022   10:23 AM  PHQ 2/9 Scores  PHQ - 2 Score 0 0 0 0 0 1 2  PHQ- 9 Score  4 5 5 3 3 4     Fall Risk    09/16/2023   11:18 AM 08/20/2023   10:33 AM 07/09/2023   11:08 AM 07/09/2023   10:34 AM 11/05/2022    2:48 PM  Fall Risk   Falls in the past year? 0 0 0 0 0  Number falls in past yr: 0 0 0 0 0  Injury with Fall? 0 0 0 0 0  Risk for fall due to : No Fall Risks No Fall Risks No Fall Risks No Fall Risks No Fall Risks  Follow up Education provided;Falls prevention discussed Falls evaluation completed Falls evaluation completed Falls evaluation completed Falls evaluation completed    MEDICARE RISK AT HOME: Medicare Risk at Home Any stairs in or around the home?: No If so, are there any without handrails?: No Home free of loose throw rugs in walkways, pet beds, electrical cords, etc?: Yes Adequate lighting in your home to reduce risk of falls?: Yes Life alert?: No Use of a cane, walker or w/c?: Yes (cane walker) Grab bars in the bathroom?: Yes Shower chair or bench in shower?: Yes Elevated toilet seat or a handicapped toilet?: No  TIMED UP AND GO:  Was the test performed?  No    Cognitive Function:        09/16/2023   11:30 AM 09/10/2022   10:44 AM 09/08/2021   11:12 AM 06/20/2019   11:00 AM 06/15/2018    2:44 PM  6CIT Screen  What Year? 0 points 0 points 0 points 0 points 0 points  What month? 0 points 0 points 0 points 0 points 0 points  What time? 0 points 0 points 0 points 0 points 0 points  Count back from 20 0 points 0 points 0 points 0 points 0 points  Months in reverse 0 points 0 points 0 points 0 points 0 points  Repeat phrase 0 points 0 points 0 points 0  points 0 points  Total Score 0 points 0 points 0 points 0 points 0 points    Immunizations Immunization History  Administered Date(s) Administered   Fluad Quad(high Dose 65+) 09/19/2019, 11/05/2022   Fluad Trivalent(High Dose 65+) 08/20/2023   Influenza, High Dose Seasonal PF 09/14/2018   Influenza,inj,Quad PF,6+ Mos 09/06/2015, 09/21/2017   PNEUMOCOCCAL CONJUGATE-20 08/20/2023   Pneumococcal Conjugate-13 08/22/2015   Pneumococcal Polysaccharide-23 09/03/2016, 09/21/2017   Tdap 08/08/2020    TDAP status: Up to date  Flu Vaccine status: Up to date  Pneumococcal vaccine status: Up to date  Covid-19 vaccine status: Declined, Education has been provided regarding the importance of this vaccine but patient still declined. Advised may receive this vaccine at local pharmacy or Health Dept.or vaccine clinic. Aware to provide a copy of the vaccination record if obtained from local pharmacy or Health Dept. Verbalized acceptance and understanding.  Qualifies for Shingles Vaccine? Yes   Zostavax completed No   Shingrix Completed?: No.    Education has been provided regarding the importance of this vaccine. Patient has been advised to call insurance company to  determine out of pocket expense if they have not yet received this vaccine. Advised may also receive vaccine at local pharmacy or Health Dept. Verbalized acceptance and understanding.  Screening Tests Health Maintenance  Topic Date Due   COVID-19 Vaccine (1) Never done   Zoster Vaccines- Shingrix (1 of 2) Never done   DEXA SCAN  09/08/2020   MAMMOGRAM  10/25/2021   Medicare Annual Wellness (AWV)  09/15/2024   DTaP/Tdap/Td (2 - Td or Tdap) 08/08/2030   Colonoscopy  10/31/2030   Pneumonia Vaccine 54+ Years old  Completed   Hepatitis C Screening  Completed   HPV VACCINES  Aged Out   INFLUENZA VACCINE  Discontinued    Health Maintenance  Health Maintenance Due  Topic Date Due   COVID-19 Vaccine (1) Never done   Zoster Vaccines-  Shingrix (1 of 2) Never done   DEXA SCAN  09/08/2020   MAMMOGRAM  10/25/2021    Colorectal cancer screening: Type of screening: Colonoscopy. Completed Yes. Repeat every 5-10 years  Mammogram status: No longer required due to mastectomy.  Bone Density status: Completed yes. Results reflect: Bone density results: OSTEOPOROSIS. Repeat every 3 years.  Lung Cancer Screening: (Low Dose CT Chest recommended if Age 50-80 years, 20 pack-year currently smoking OR have quit w/in 15years.) does not qualify.   Lung Cancer Screening Referral: no  Additional Screening:  Hepatitis C Screening: does not qualify; Completed yes  Vision Screening: Recommended annual ophthalmology exams for early detection of glaucoma and other disorders of the eye. Is the patient up to date with their annual eye exam?  Yes  Who is the provider or what is the name of the office in which the patient attends annual eye exams? [facility] If pt is not established with a provider, would they like to be referred to a provider to establish care? No .   Dental Screening: Recommended annual dental exams for proper oral hygiene  Diabetic Foot Exam: n/a  Community Resource Referral / Chronic Care Management: CRR required this visit?  No   CCM required this visit?  No    Plan:     I have personally reviewed and noted the following in the patient's chart:   Medical and social history Use of alcohol, tobacco or illicit drugs  Current medications and supplements including opioid prescriptions. Patient is not currently taking opioid prescriptions. Functional ability and status Nutritional status Physical activity Advanced directives List of other physicians Hospitalizations, surgeries, and ER visits in previous 12 months Vitals Screenings to include cognitive, depression, and falls Referrals and appointments  In addition, I have reviewed and discussed with patient certain preventive protocols, quality metrics, and  best practice recommendations. A written personalized care plan for preventive services as well as general preventive health recommendations were provided to patient.     Sue Lush, LPN   [id number]/[id number]   After Visit Summary: (MyChart) Due to this being a telephonic visit, the after visit summary with patients personalized plan was offered to patient via MyChart   Nurse Notes: The patient states she is doing well and has no concerns or questions at this time.   **Pt due for Dexa Scan

## 2023-09-16 NOTE — Patient Instructions (Signed)
Erin Romero , Thank you for taking time to come for your Medicare Wellness Visit. I appreciate your ongoing commitment to your health goals. Please review the following plan we discussed and let me know if I can assist you in the future.   Referrals/Orders/Follow-Ups/Clinician Recommendations: none  This is a list of the screening recommended for you and due dates:  Health Maintenance  Topic Date Due   COVID-19 Vaccine (1) Never done   Zoster (Shingles) Vaccine (1 of 2) Never done   DEXA scan (bone density measurement)  09/08/2020   Mammogram  10/25/2021   Medicare Annual Wellness Visit  09/15/2024   DTaP/Tdap/Td vaccine (2 - Td or Tdap) 08/08/2030   Colon Cancer Screening  10/31/2030   Pneumonia Vaccine  Completed   Hepatitis C Screening  Completed   HPV Vaccine  Aged Out   Flu Shot  Discontinued    Advanced directives: (Copy Requested) Please bring a copy of your health care power of attorney and living will to the office to be added to your chart at your convenience.  Next Medicare Annual Wellness Visit scheduled for next year: Yes 09/19/2024 @ 11:45am telephone

## 2023-09-28 ENCOUNTER — Ambulatory Visit
Admission: RE | Admit: 2023-09-28 | Discharge: 2023-09-28 | Disposition: A | Discharge status: Home / Self Care | Payer: Medicare HMO | Source: Ambulatory Visit | Attending: Family Medicine | Admitting: Family Medicine

## 2023-09-28 DIAGNOSIS — Z780 Asymptomatic menopausal state: Secondary | ICD-10-CM | POA: Diagnosis not present

## 2023-09-28 DIAGNOSIS — M8080XS Other osteoporosis with current pathological fracture, unspecified site, sequela: Secondary | ICD-10-CM | POA: Diagnosis not present

## 2023-09-28 DIAGNOSIS — M8589 Other specified disorders of bone density and structure, multiple sites: Secondary | ICD-10-CM | POA: Diagnosis not present

## 2023-09-28 DIAGNOSIS — M85852 Other specified disorders of bone density and structure, left thigh: Secondary | ICD-10-CM | POA: Diagnosis not present

## 2023-09-28 DIAGNOSIS — Z853 Personal history of malignant neoplasm of breast: Secondary | ICD-10-CM | POA: Insufficient documentation

## 2023-09-28 DIAGNOSIS — Z9221 Personal history of antineoplastic chemotherapy: Secondary | ICD-10-CM | POA: Insufficient documentation

## 2023-09-28 DIAGNOSIS — Z923 Personal history of irradiation: Secondary | ICD-10-CM | POA: Insufficient documentation

## 2023-09-29 NOTE — Progress Notes (Signed)
Decreased bone strength noted at the level known as osteopenia or bone softening. Can use Vit D 800 IU and Calcium 1200 mg to assist regular exercise for bone health. Can repeat DEXA in 3-5 years if desired.

## 2023-09-30 DIAGNOSIS — Z7901 Long term (current) use of anticoagulants: Secondary | ICD-10-CM | POA: Diagnosis not present

## 2023-10-28 DIAGNOSIS — Z7901 Long term (current) use of anticoagulants: Secondary | ICD-10-CM | POA: Diagnosis not present

## 2023-10-30 DIAGNOSIS — R0609 Other forms of dyspnea: Secondary | ICD-10-CM | POA: Diagnosis not present

## 2023-10-30 DIAGNOSIS — Z68.41 Body mass index (BMI) pediatric, 120% of the 95th percentile for age to less than 140% of the 95th percentile for age: Secondary | ICD-10-CM | POA: Diagnosis not present

## 2023-10-30 DIAGNOSIS — G4733 Obstructive sleep apnea (adult) (pediatric): Secondary | ICD-10-CM | POA: Diagnosis not present

## 2023-11-12 DIAGNOSIS — H2512 Age-related nuclear cataract, left eye: Secondary | ICD-10-CM | POA: Diagnosis not present

## 2023-11-13 DIAGNOSIS — H2511 Age-related nuclear cataract, right eye: Secondary | ICD-10-CM | POA: Diagnosis not present

## 2023-11-16 DIAGNOSIS — K219 Gastro-esophageal reflux disease without esophagitis: Secondary | ICD-10-CM | POA: Diagnosis not present

## 2023-11-16 DIAGNOSIS — G4733 Obstructive sleep apnea (adult) (pediatric): Secondary | ICD-10-CM | POA: Diagnosis not present

## 2023-11-16 DIAGNOSIS — I480 Paroxysmal atrial fibrillation: Secondary | ICD-10-CM | POA: Diagnosis not present

## 2023-11-16 DIAGNOSIS — I272 Pulmonary hypertension, unspecified: Secondary | ICD-10-CM | POA: Diagnosis not present

## 2023-11-16 DIAGNOSIS — Z7901 Long term (current) use of anticoagulants: Secondary | ICD-10-CM | POA: Diagnosis not present

## 2023-11-16 DIAGNOSIS — R0602 Shortness of breath: Secondary | ICD-10-CM | POA: Diagnosis not present

## 2023-11-16 DIAGNOSIS — E669 Obesity, unspecified: Secondary | ICD-10-CM | POA: Diagnosis not present

## 2023-11-16 DIAGNOSIS — E7800 Pure hypercholesterolemia, unspecified: Secondary | ICD-10-CM | POA: Diagnosis not present

## 2023-11-16 DIAGNOSIS — I10 Essential (primary) hypertension: Secondary | ICD-10-CM | POA: Diagnosis not present

## 2023-12-03 DIAGNOSIS — H25041 Posterior subcapsular polar age-related cataract, right eye: Secondary | ICD-10-CM | POA: Diagnosis not present

## 2023-12-03 DIAGNOSIS — H25011 Cortical age-related cataract, right eye: Secondary | ICD-10-CM | POA: Diagnosis not present

## 2023-12-03 DIAGNOSIS — H25813 Combined forms of age-related cataract, bilateral: Secondary | ICD-10-CM | POA: Diagnosis not present

## 2023-12-03 DIAGNOSIS — H2511 Age-related nuclear cataract, right eye: Secondary | ICD-10-CM | POA: Diagnosis not present

## 2023-12-11 DIAGNOSIS — Z7901 Long term (current) use of anticoagulants: Secondary | ICD-10-CM | POA: Diagnosis not present

## 2023-12-11 DIAGNOSIS — I480 Paroxysmal atrial fibrillation: Secondary | ICD-10-CM | POA: Diagnosis not present

## 2024-01-08 DIAGNOSIS — Z7901 Long term (current) use of anticoagulants: Secondary | ICD-10-CM | POA: Diagnosis not present

## 2024-01-08 DIAGNOSIS — I480 Paroxysmal atrial fibrillation: Secondary | ICD-10-CM | POA: Diagnosis not present

## 2024-01-22 DIAGNOSIS — Z7901 Long term (current) use of anticoagulants: Secondary | ICD-10-CM | POA: Diagnosis not present

## 2024-01-22 DIAGNOSIS — I480 Paroxysmal atrial fibrillation: Secondary | ICD-10-CM | POA: Diagnosis not present

## 2024-02-17 DIAGNOSIS — Z7901 Long term (current) use of anticoagulants: Secondary | ICD-10-CM | POA: Diagnosis not present

## 2024-02-17 DIAGNOSIS — I480 Paroxysmal atrial fibrillation: Secondary | ICD-10-CM | POA: Diagnosis not present

## 2024-03-28 DIAGNOSIS — I480 Paroxysmal atrial fibrillation: Secondary | ICD-10-CM | POA: Diagnosis not present

## 2024-04-28 DIAGNOSIS — I480 Paroxysmal atrial fibrillation: Secondary | ICD-10-CM | POA: Diagnosis not present

## 2024-04-28 DIAGNOSIS — Z7901 Long term (current) use of anticoagulants: Secondary | ICD-10-CM | POA: Diagnosis not present

## 2024-04-28 DIAGNOSIS — G4733 Obstructive sleep apnea (adult) (pediatric): Secondary | ICD-10-CM | POA: Diagnosis not present

## 2024-04-28 DIAGNOSIS — R0609 Other forms of dyspnea: Secondary | ICD-10-CM | POA: Diagnosis not present

## 2024-05-24 DIAGNOSIS — Z7901 Long term (current) use of anticoagulants: Secondary | ICD-10-CM | POA: Diagnosis not present

## 2024-05-24 DIAGNOSIS — I480 Paroxysmal atrial fibrillation: Secondary | ICD-10-CM | POA: Diagnosis not present

## 2024-06-06 ENCOUNTER — Inpatient Hospital Stay: Payer: Medicare HMO | Attending: Oncology

## 2024-06-06 DIAGNOSIS — Z170 Estrogen receptor positive status [ER+]: Secondary | ICD-10-CM | POA: Insufficient documentation

## 2024-06-06 DIAGNOSIS — Z9013 Acquired absence of bilateral breasts and nipples: Secondary | ICD-10-CM | POA: Diagnosis not present

## 2024-06-06 DIAGNOSIS — Z79899 Other long term (current) drug therapy: Secondary | ICD-10-CM | POA: Insufficient documentation

## 2024-06-06 DIAGNOSIS — E876 Hypokalemia: Secondary | ICD-10-CM | POA: Diagnosis not present

## 2024-06-06 DIAGNOSIS — Z862 Personal history of diseases of the blood and blood-forming organs and certain disorders involving the immune mechanism: Secondary | ICD-10-CM

## 2024-06-06 DIAGNOSIS — Z853 Personal history of malignant neoplasm of breast: Secondary | ICD-10-CM | POA: Diagnosis not present

## 2024-06-06 DIAGNOSIS — C50911 Malignant neoplasm of unspecified site of right female breast: Secondary | ICD-10-CM | POA: Insufficient documentation

## 2024-06-06 DIAGNOSIS — M85852 Other specified disorders of bone density and structure, left thigh: Secondary | ICD-10-CM | POA: Diagnosis not present

## 2024-06-06 LAB — CBC WITH DIFFERENTIAL (CANCER CENTER ONLY)
Abs Immature Granulocytes: 0.04 10*3/uL (ref 0.00–0.07)
Basophils Absolute: 0 10*3/uL (ref 0.0–0.1)
Basophils Relative: 0 %
Eosinophils Absolute: 0.1 10*3/uL (ref 0.0–0.5)
Eosinophils Relative: 1 %
HCT: 38.4 % (ref 36.0–46.0)
Hemoglobin: 12.9 g/dL (ref 12.0–15.0)
Immature Granulocytes: 0 %
Lymphocytes Relative: 19 %
Lymphs Abs: 1.7 10*3/uL (ref 0.7–4.0)
MCH: 28.7 pg (ref 26.0–34.0)
MCHC: 33.6 g/dL (ref 30.0–36.0)
MCV: 85.5 fL (ref 80.0–100.0)
Monocytes Absolute: 0.7 10*3/uL (ref 0.1–1.0)
Monocytes Relative: 7 %
Neutro Abs: 6.5 10*3/uL (ref 1.7–7.7)
Neutrophils Relative %: 73 %
Platelet Count: 208 10*3/uL (ref 150–400)
RBC: 4.49 MIL/uL (ref 3.87–5.11)
RDW: 15.3 % (ref 11.5–15.5)
WBC Count: 9 10*3/uL (ref 4.0–10.5)
nRBC: 0 % (ref 0.0–0.2)

## 2024-06-06 LAB — IRON AND TIBC
Iron: 67 ug/dL (ref 28–170)
Saturation Ratios: 17 % (ref 10.4–31.8)
TIBC: 398 ug/dL (ref 250–450)
UIBC: 331 ug/dL

## 2024-06-06 LAB — CMP (CANCER CENTER ONLY)
ALT: 13 U/L (ref 0–44)
AST: 23 U/L (ref 15–41)
Albumin: 3.6 g/dL (ref 3.5–5.0)
Alkaline Phosphatase: 86 U/L (ref 38–126)
Anion gap: 10 (ref 5–15)
BUN: 21 mg/dL (ref 8–23)
CO2: 26 mmol/L (ref 22–32)
Calcium: 9.3 mg/dL (ref 8.9–10.3)
Chloride: 102 mmol/L (ref 98–111)
Creatinine: 0.91 mg/dL (ref 0.44–1.00)
GFR, Estimated: >60 mL/min (ref >60)
Glucose, Bld: 161 mg/dL — ABNORMAL HIGH (ref 70–99)
Potassium: 3.1 mmol/L — ABNORMAL LOW (ref 3.5–5.1)
Sodium: 138 mmol/L (ref 135–145)
Total Bilirubin: 0.9 mg/dL (ref 0.0–1.2)
Total Protein: 7.9 g/dL (ref 6.5–8.1)

## 2024-06-06 LAB — FERRITIN: Ferritin: 35 ng/mL (ref 11–307)

## 2024-06-07 LAB — CANCER ANTIGEN 27.29: CA 27.29: 28.3 U/mL (ref 0.0–38.6)

## 2024-06-09 ENCOUNTER — Inpatient Hospital Stay: Admitting: Oncology

## 2024-06-09 ENCOUNTER — Encounter: Payer: Self-pay | Admitting: Oncology

## 2024-06-09 VITALS — BP 137/60 | HR 60 | Temp 98.5°F | Resp 18 | Wt 237.4 lb

## 2024-06-09 DIAGNOSIS — M8580 Other specified disorders of bone density and structure, unspecified site: Secondary | ICD-10-CM

## 2024-06-09 DIAGNOSIS — M85852 Other specified disorders of bone density and structure, left thigh: Secondary | ICD-10-CM | POA: Diagnosis not present

## 2024-06-09 DIAGNOSIS — Z853 Personal history of malignant neoplasm of breast: Secondary | ICD-10-CM

## 2024-06-09 DIAGNOSIS — E876 Hypokalemia: Secondary | ICD-10-CM

## 2024-06-09 DIAGNOSIS — Z9013 Acquired absence of bilateral breasts and nipples: Secondary | ICD-10-CM | POA: Diagnosis not present

## 2024-06-09 DIAGNOSIS — Z79899 Other long term (current) drug therapy: Secondary | ICD-10-CM | POA: Diagnosis not present

## 2024-06-09 DIAGNOSIS — C50911 Malignant neoplasm of unspecified site of right female breast: Secondary | ICD-10-CM | POA: Diagnosis not present

## 2024-06-09 DIAGNOSIS — Z170 Estrogen receptor positive status [ER+]: Secondary | ICD-10-CM | POA: Diagnosis not present

## 2024-06-09 NOTE — Assessment & Plan Note (Addendum)
#  Osteoporosis,continue calcium  and vitamin D supplementation.   2024 DEXA showed osteopenia  She previously declines bisphosphonate treatments.

## 2024-06-09 NOTE — Assessment & Plan Note (Signed)
 Potassium 3.1.  Patient is on potassium supplementation chronically.  Recommend patient continue follow-up with primary care provider.

## 2024-06-09 NOTE — Progress Notes (Signed)
 Hematology/Oncology Progress note [phone] [phone]     Clinic day:  06/09/24   Chief Complaint: Erin Romero is a 73 y.o. female with stage IIB right breast cancer who is here for follow up.    ASSESSMENT & PLAN:   History of breast cancer #01/24/2013 stage IIB (T2N1M0) right breast cancer s/p right mastectomy  ER/ PR+ and HER-2- finished letrozole  x 5 years, stopped in 09/2018- BCI showed low benefit of extended therapy #11/2019  left DCIS, high-grade s/p left mastectomy Off letrozole  due to lack of benefit from aromatase inhibitor with bilateral mastectomy.  Labs are reviewed and discussed with patient. She is 4.5 years after her DCIS surgery.  Clinically she is doing very well.  Patient elects to be discharged from clinic.  I recommend patient to continue follow-up with primary care provider  Osteopenia #Osteoporosis,continue calcium  and vitamin D supplementation.   2024 DEXA showed osteopenia  She previously declines bisphosphonate treatments.  Hypokalemia Potassium 3.1.  Patient is on potassium supplementation chronically.  Recommend patient continue follow-up with primary care provider.   No orders of the defined types were placed in this encounter.   All questions were answered. The patient knows to call the clinic with any problems, questions or concerns.  Timmy Forbes, MD, PhD [facility] Hematology Oncology 06/09/2024   PERTINENT ONCOLOGY HISTORY Patient previously followed up with Dr. Beverely Buba.  Switch care to me on 08/16/2019. Extensive medical records were reviewed. history of stage IIB (T2N1M0) right breast cancer status post radical mastectomy on 2/3/2 medical 014.  Pathology revealed a 3 cm grade 2 invasive mammary carcinoma. 0/2 sentinel lymph nodes were positive.  In non-sentinel lymph nodes were positive for 0.5 cm macro metastasis with extracapsular extension.  DCIS was present.  ER >90 percent, PR>90%, HER-2/neu negative. Patient  underwent 3 cycles of adjuvant chemotherapy with Taxotere and Cytoxan (03/08/2013 - 04/20/2013).  Treatment was complicated with erythromelalgia and an urticarial reaction.  And she did not receive any additional chemotherapy. Adjuvant radiation finished in July 2014.  Patient was started on letrozole  in August 2014 and she self discontinued in October 2019. BCI showed low likelihood of benefit from extended endocrine therapy.  13.6% risk of late recurrence years 5-10. Patient has been off Letrozole  since 09/2018.   Mammogram on 02/06/2014 was suspicious for left breast mass.  Left breast biopsy on 02/27/2014 was negative for malignancy.  Left-sided mammogram on 10/05/2017 was negative.   Left breast screening mammogram on 10/11/2018 revealed no evidence of malignancy.  #  Osteoporosis,  Bone density study on 06/05/2015 revealed osteopenia with a T score of -1.9 in the left femoral neck.  Bone density on 09/08/2018 revealed osteoporosis with a T-score of -2.5 in the left femoral neck and -2.2 in AP spine L2-L4.  Patient is on calcium  and vitamin D.  She was on an oral bisphosphonate x 1 month.  Patient previosly declined Prolia. with T score of -2.5 of left femoral neck and -2.2 AP spine L2-L4.  Previously declined Prolia.  She reports that she had tried some bone strengthen medication for 1 month last year, and did not tolerate.  She cannot remember the name of the medication.  # 01/30/19 left DCIS ER +, s/p mastectomy Patient was resumed on letrozole  in December 2020.  After discussion with Dr. Marquita Situ, decision was made to stop letrozole  given that patient has now had bilateral mastectomy and benefit of prophylaxis from AI is very small   #Osteoporosis, continue calcium  and vitamin  D supplementation. Previously discussed about bisphosphonate or Prolia treatment.  Patient declined.   #Family history of cancer, personal history of invasive breast cancer.  No newly diagnosed DCIS, I discussed with  patient about genetic testing.  Patient declines.  INTERVAL HISTORY Erin Romero is a 73 y.o. female who has above history reviewed by me today presents for follow up visit for management of breast cancer Patient reports no new concerns today. No concerns at previous mastectomy sites.  Past Medical History:  Diagnosis Date   Anxiety    Atrial fibrillation (HCC) 2014   Breast cancer [facility]) 2014   right breast   Dysrhythmia    Fibrocystic breast disease 2005   GERD (gastroesophageal reflux disease)    Hypertension 2005   Lump or mass in breast 2014   right mastectomy   Malignant neoplasm of central portion of female breast (HCC) 01/24/2013   Stage II,T2, N1a. ER/PR 90%, HER-2/neu: Not overexpressed. Mastectomy with sentinel node biopsy. 2 sentinel nodes negative, non-sentinel node w/ 5 mm macrometastatic foci in non-sentinel node.  Received post mastectomy radiation.     Past Surgical History:  Procedure Laterality Date   ABDOMINAL HYSTERECTOMY  1999   BREAST BIOPSY Left 2015   BENIGN FIBROTIC BREAST TISSUE WITH CLUSTERED APOCRINE   BREAST BIOPSY Left 11/02/2019   affirm bx, x marker, path pending   BREAST SURGERY Right 2005,2014   biopsy   BREAST SURGERY Right 2014   mastectomy   COLONOSCOPY  2002   COLONOSCOPY WITH PROPOFOL  N/A 10/16/2015   Procedure: COLONOSCOPY WITH PROPOFOL ;  Surgeon: Marshall Skeeter, MD;  Location: [facility] ENDOSCOPY;  Service: Endoscopy;  Laterality: N/A;   COLONOSCOPY WITH PROPOFOL  N/A 10/31/2020   Procedure: COLONOSCOPY WITH PROPOFOL ;  Surgeon: Irby Mannan, MD;  Location: [facility] ENDOSCOPY;  Service: Endoscopy;  Laterality: N/A;   ESOPHAGOGASTRODUODENOSCOPY (EGD) WITH PROPOFOL  N/A 10/31/2020   Procedure: ESOPHAGOGASTRODUODENOSCOPY (EGD) WITH PROPOFOL ;  Surgeon: Irby Mannan, MD;  Location: [facility] ENDOSCOPY;  Service: Endoscopy;  Laterality: N/A;   GIVENS CAPSULE STUDY N/A 12/27/2020   Procedure: GIVENS CAPSULE STUDY;  Surgeon: Irby Mannan, MD;  Location: [facility] ENDOSCOPY;  Service: Endoscopy;  Laterality: N/A;   KNEE SURGERY Left 2005   MASTECTOMY Right 2014   radiation chemo   SIMPLE MASTECTOMY WITH AXILLARY SENTINEL NODE BIOPSY Left 11/30/2019   Procedure: SIMPLE MASTECTOMY WITH AXILLARY SENTINEL NODE BIOPSY;  Surgeon: Marshall Skeeter, MD;  Location: [facility] ORS;  Service: General;  Laterality: Left;   thumb surg Right 2004   TONSILLECTOMY     age of 3    Family History  Problem Relation Age of Onset   Heart disease Father    Hypertension Sister    Cancer Mother        cervical   Cancer Other        ovarian,liver,colon cancer-no family member listed   Osteoporosis Paternal Aunt    Osteoporosis Paternal Grandmother     Social History:  reports that she has never smoked. She has never used smokeless tobacco. She reports that she does not drink alcohol and does not use drugs.    Allergies:  Allergies  Allergen Reactions   Docetaxel Itching and Rash    Other reaction(s): edema   Sulfa Antibiotics Hives    Current Medications: Current Outpatient Medications  Medication Sig Dispense Refill   amLODipine  (NORVASC ) 10 MG tablet Take 1 tablet (10 mg total) by mouth daily. 100 tablet 3   atorvastatin  (LIPITOR) 10 MG  tablet Take 1 tablet (10 mg total) by mouth daily. 90 tablet 3   Calcium  Carb-Cholecalciferol (OYSTER SHELL CALCIUM  + D PO) Take 1 tablet by mouth daily. 600mg  calcium  plus 20 mg Vit D     Cholecalciferol (VITAMIN D-3) 25 MCG (1000 UT) CAPS Take 1,000 Units by mouth daily.      escitalopram  (LEXAPRO ) 10 MG tablet Take 1 tablet (10 mg total) by mouth daily. 100 tablet 3   metoprolol  succinate (TOPROL -XL) 100 MG 24 hr tablet Take 2 tablets (200 mg total) by mouth daily. 200 tablet 3   omeprazole  (PRILOSEC ) 20 MG capsule Take 1 capsule (20 mg total) by mouth daily. 100 capsule 3   potassium chloride  SA (KLOR-CON  M) 20 MEQ tablet Take 1 tablet (20 mEq total) by mouth daily. 100 tablet 3   traZODone   (DESYREL ) 50 MG tablet Take 1 tablet (50 mg total) by mouth daily. 100 tablet 3   triamcinolone  ointment (KENALOG ) 0.5 % APPLY TOPICALLY TO BILATER AL LOWER LEGS TWICE DAILY 90 g 3   valsartan -hydrochlorothiazide  (DIOVAN -HCT) 320-12.5 MG tablet Take 1 tablet by mouth daily. 100 tablet 3   warfarin (COUMADIN ) 1 MG tablet Take 1 tablet by mouth daily. Takes 2mg  on Tue/Thurs & 1.5mg  on all other days (Patient taking differently: Take 1 tablet by mouth daily. 1.5 mg daily)     warfarin (COUMADIN ) 2 MG tablet Take 2 mg by mouth as directed. 1.5 mg x 5 days and 2mg  x 2 days. (Takes 2mg  on Tue/Thurs & 1.5mg  on all other days ) (Patient not taking: Reported on 06/09/2024)     No current facility-administered medications for this visit.   Review of Systems  Constitutional:  Positive for fatigue. Negative for appetite change, chills and fever.  HENT:   Negative for hearing loss and voice change.   Eyes:  Negative for eye problems.  Respiratory:  Negative for chest tightness and cough.   Cardiovascular:  Negative for chest pain.  Gastrointestinal:  Negative for abdominal distention, abdominal pain and blood in stool.  Endocrine: Negative for hot flashes.  Genitourinary:  Negative for difficulty urinating and frequency.   Musculoskeletal:  Positive for arthralgias.  Skin:  Negative for itching and rash.  Neurological:  Negative for extremity weakness.  Hematological:  Negative for adenopathy.  Psychiatric/Behavioral:  Negative for confusion.   Breast exam is performed in seated and lying down position. Patient is status post bilateral mastectomy.  No palpable chest wall recurrence. No palpable bilateral axillary adenopathy    Physical Exam: Blood pressure 137/60, pulse 60, temperature 98.5 F (36.9 C), resp. rate 18, weight 237 lb 6.4 oz (107.7 kg). Physical Exam Constitutional:      General: She is not in acute distress.    Appearance: She is obese. She is not diaphoretic.  HENT:     Head:  Normocephalic and atraumatic.     Mouth/Throat:     Pharynx: No oropharyngeal exudate.   Eyes:     General: No scleral icterus.    Pupils: Pupils are equal, round, and reactive to light.    Cardiovascular:     Rate and Rhythm: Normal rate and regular rhythm.  Pulmonary:     Effort: Pulmonary effort is normal. No respiratory distress.  Abdominal:     General: There is no distension.     Palpations: Abdomen is soft.     Tenderness: There is no abdominal tenderness.   Musculoskeletal:        General: Normal range of motion.  Cervical back: Normal range of motion and neck supple.     Comments: Bilateral lower extremity 1+ edema, dermatitis   Skin:    General: Skin is warm and dry.     Findings: No erythema.   Neurological:     Mental Status: She is alert and oriented to person, place, and time.     Cranial Nerves: No cranial nerve deficit.   Psychiatric:        Mood and Affect: Affect normal.   No palpable chest wall mass at bilateral mastectomy sites.   RADIOGRAPHIC STUDIES: I have personally reviewed the radiological images as listed and agreed with the findings in the report. No results found.

## 2024-06-09 NOTE — Assessment & Plan Note (Addendum)
#  01/24/2013 stage IIB (T2N1M0) right breast cancer s/p right mastectomy  ER/ PR+ and HER-2- finished letrozole  x 5 years, stopped in 09/2018- BCI showed low benefit of extended therapy #11/2019  left DCIS, high-grade s/p left mastectomy Off letrozole  due to lack of benefit from aromatase inhibitor with bilateral mastectomy.  Labs are reviewed and discussed with patient. She is 4.5 years after her DCIS surgery.  Clinically she is doing very well.  Patient elects to be discharged from clinic.  I recommend patient to continue follow-up with primary care provider

## 2024-06-10 ENCOUNTER — Ambulatory Visit: Payer: Medicare HMO | Admitting: Oncology

## 2024-06-10 DIAGNOSIS — E66813 Obesity, class 3: Secondary | ICD-10-CM | POA: Diagnosis not present

## 2024-06-10 DIAGNOSIS — I10 Essential (primary) hypertension: Secondary | ICD-10-CM | POA: Diagnosis not present

## 2024-06-10 DIAGNOSIS — I482 Chronic atrial fibrillation, unspecified: Secondary | ICD-10-CM | POA: Diagnosis not present

## 2024-06-10 DIAGNOSIS — G4733 Obstructive sleep apnea (adult) (pediatric): Secondary | ICD-10-CM | POA: Diagnosis not present

## 2024-06-10 DIAGNOSIS — R0602 Shortness of breath: Secondary | ICD-10-CM | POA: Diagnosis not present

## 2024-06-10 DIAGNOSIS — K219 Gastro-esophageal reflux disease without esophagitis: Secondary | ICD-10-CM | POA: Diagnosis not present

## 2024-06-10 DIAGNOSIS — I480 Paroxysmal atrial fibrillation: Secondary | ICD-10-CM | POA: Diagnosis not present

## 2024-06-10 DIAGNOSIS — E669 Obesity, unspecified: Secondary | ICD-10-CM | POA: Diagnosis not present

## 2024-06-10 DIAGNOSIS — I272 Pulmonary hypertension, unspecified: Secondary | ICD-10-CM | POA: Diagnosis not present

## 2024-06-22 DIAGNOSIS — I480 Paroxysmal atrial fibrillation: Secondary | ICD-10-CM | POA: Diagnosis not present

## 2024-06-22 DIAGNOSIS — Z7901 Long term (current) use of anticoagulants: Secondary | ICD-10-CM | POA: Diagnosis not present

## 2024-06-22 NOTE — Progress Notes (Signed)
 Documentation for warfarin management Last 3 INR results:  Lab Results  Component Value Date   INR 2.9 (!) 06/22/2024   INR 2.9 (!) 05/24/2024   INR 3.2 (!) 04/28/2024    Dosage adjustment or any change in the treatment regimen today: No.  INR above goal range. Patient advised to continue current warfarin dosing regimen of 10.5mg  total weekly dose, 1.5mg  (1mg  x 1.5) everyday.    Management of result: nurse driven protocol implemented  Patient/caregiver education provided during today's visit: dosing.  Patient/caregiver did verbalize understanding of the no change in dose and any education provided.   The patient/caregiver is instructed to return in one month for recheck.  Future Appointments     Date/Time Provider Department Center Visit Type   07/21/2024 11:00 AM [facility] NURSE POD B [facility] C NURSE VISIT   07/21/2024 11:00 AM [facility] MARYL BROCKS ANTI-COAG   11/01/2024 11:00 AM Theotis Lavelle BRAVO, MD [facility] C RETURN VISIT   12/07/2024 10:45 AM Callwood, Cara Endow, MD [facility] C FOLLOW UP

## 2024-07-07 ENCOUNTER — Telehealth: Payer: Self-pay | Admitting: Family Medicine

## 2024-07-07 NOTE — Telephone Encounter (Signed)
 [facility] is asking for refills of Atorvastatin  10 mg. #90 with 3 RF

## 2024-07-08 ENCOUNTER — Other Ambulatory Visit: Payer: Self-pay

## 2024-07-08 DIAGNOSIS — E7800 Pure hypercholesterolemia, unspecified: Secondary | ICD-10-CM

## 2024-07-08 MED ORDER — ATORVASTATIN CALCIUM 10 MG PO TABS: 10.0000 mg | ORAL_TABLET | Freq: Every day | ORAL | 0 refills | Status: DC

## 2024-07-08 NOTE — Telephone Encounter (Signed)
 Spoke with patient, advised of doing a courtesy refill for 30 days. Patient scheduled an appt to continue care with a provider and preferred a female provider. I have her scheduled for July 24 at 2pm.

## 2024-07-13 ENCOUNTER — Telehealth: Payer: Self-pay | Admitting: Family Medicine

## 2024-07-14 ENCOUNTER — Ambulatory Visit: Admitting: Physician Assistant

## 2024-07-21 DIAGNOSIS — Z7901 Long term (current) use of anticoagulants: Secondary | ICD-10-CM | POA: Diagnosis not present

## 2024-07-21 DIAGNOSIS — I480 Paroxysmal atrial fibrillation: Secondary | ICD-10-CM | POA: Diagnosis not present

## 2024-07-21 NOTE — Progress Notes (Signed)
 Documentation for warfarin management Last 3 INR results:  Lab Results  Component Value Date   INR 2.6 (!) 07/21/2024   INR 2.9 (!) 06/22/2024   INR 2.9 (!) 05/24/2024    Dosage adjustment or any change in the treatment regimen today: No. INR within goal range. Patient advised to continue current warfarin dosing regimen of 10.5mg  total weekly dose, 1.5mg  (1mg  x 1.5) everyday.    Management of result: nurse driven protocol implemented  Patient/caregiver education provided during today's visit: dosing.  Patient/caregiver did verbalize understanding of the no change in dose and any education provided.   The patient/caregiver is instructed to return in one month for recheck.  Future Appointments     Date/Time Provider Department Center Visit Type   08/17/2024 11:00 AM [facility] NURSE POD B [facility] C NURSE VISIT   08/17/2024 11:00 AM [facility] MARYL BROCKS ANTI-COAG   11/01/2024 11:00 AM Theotis Lavelle BRAVO, MD [facility] C RETURN VISIT   12/07/2024 10:45 AM Callwood, Cara Endow, MD [facility] C FOLLOW UP

## 2024-07-27 NOTE — Telephone Encounter (Signed)
 Opened in error

## 2024-07-29 ENCOUNTER — Encounter: Payer: Self-pay | Admitting: Physician Assistant

## 2024-07-29 ENCOUNTER — Ambulatory Visit (INDEPENDENT_AMBULATORY_CARE_PROVIDER_SITE_OTHER): Admitting: Physician Assistant

## 2024-07-29 VITALS — BP 133/67 | HR 72 | Resp 16 | Ht 64.0 in | Wt 235.6 lb

## 2024-07-29 DIAGNOSIS — E7800 Pure hypercholesterolemia, unspecified: Secondary | ICD-10-CM | POA: Diagnosis not present

## 2024-07-29 DIAGNOSIS — I4811 Longstanding persistent atrial fibrillation: Secondary | ICD-10-CM

## 2024-07-29 DIAGNOSIS — M7989 Other specified soft tissue disorders: Secondary | ICD-10-CM | POA: Diagnosis not present

## 2024-07-29 DIAGNOSIS — M8080XS Other osteoporosis with current pathological fracture, unspecified site, sequela: Secondary | ICD-10-CM

## 2024-07-29 DIAGNOSIS — I10 Essential (primary) hypertension: Secondary | ICD-10-CM

## 2024-07-29 DIAGNOSIS — Z853 Personal history of malignant neoplasm of breast: Secondary | ICD-10-CM

## 2024-07-29 DIAGNOSIS — K222 Esophageal obstruction: Secondary | ICD-10-CM | POA: Diagnosis not present

## 2024-07-29 DIAGNOSIS — E876 Hypokalemia: Secondary | ICD-10-CM

## 2024-07-29 DIAGNOSIS — F419 Anxiety disorder, unspecified: Secondary | ICD-10-CM | POA: Diagnosis not present

## 2024-07-29 DIAGNOSIS — M8000XA Age-related osteoporosis with current pathological fracture, unspecified site, initial encounter for fracture: Secondary | ICD-10-CM | POA: Insufficient documentation

## 2024-07-29 DIAGNOSIS — K219 Gastro-esophageal reflux disease without esophagitis: Secondary | ICD-10-CM

## 2024-07-29 DIAGNOSIS — L299 Pruritus, unspecified: Secondary | ICD-10-CM

## 2024-07-29 NOTE — Progress Notes (Signed)
 Established patient visit  Patient: Erin Romero   DOB: Sep 16, 1951   73 y.o. Female  MRN: [id number] Visit Date: 07/29/2024  Today's healthcare provider: Jolynn Spencer, PA-C   Chief Complaint  Patient presents with   Transitions Of Care    Last mammogram 10/13/2019   Medication Management    Her pharmacy requested she see's a doctor since she does mail order.   Leg Swelling    Her heart doctor has her on fluid pill but still has leg swelling and has to go to the bathroom frequently along with SOB.   Subjective     HPI     Transitions Of Care    Additional comments: Last mammogram 10/13/2019        Medication Management    Additional comments: Her pharmacy requested she see's a doctor since she does mail order.        Leg Swelling    Additional comments: Her heart doctor has her on fluid pill but still has leg swelling and has to go to the bathroom frequently along with SOB.      Last edited by Wilfred Hargis RAMAN, CMA on 07/29/2024  1:16 PM.       Discussed the use of AI scribe software for clinical note transcription with the patient, who gave verbal consent to proceed.  History of Present Illness Erin Romero is a 73 year old female with hypertension and heart failure who presents for medication management and follow-up.  She experiences persistent leg swelling and bloating despite her current regimen of amlodipine , metoprolol  succinate, torsemide, and valsartan /hydrochlorothiazide . She takes a diuretic to manage these symptoms. Her potassium levels have been low, and she is on supplements. She is unsure if blood work is needed today to recheck her levels.  She is under cardiology care and takes warfarin, with a recent dosage adjustment to 1.5 tablets daily, monitored by a warfarin clinic. She also takes omeprazole  for acid reflux due to a Schatzki ring.  She takes Lipitor for hypercholesterolemia and trazodone  and Lexapro  for sleep and anxiety/depression.  She supplements with vitamin D3, calcium , and vitamin C due to poor dietary habits. She has a history of elevated blood sugar but has not been diagnosed with diabetes.  She has osteoporosis and arthritis in her knee, causing pain and discomfort. She discontinued osteoporosis medication due to side effects.  She reports recent weight gain since the loss of her dog in December, impacting her emotional well-being. She feels depressed and lonely.  She experiences itching and scratching on her legs, using triamcinolone  ointment occasionally. She notes a red line on her leg.       07/29/2024    1:11 PM 09/16/2023   11:22 AM 08/20/2023   10:33 AM  Depression screen PHQ 2/9  Decreased Interest 0 0 0  Down, Depressed, Hopeless 0 0 0  PHQ - 2 Score 0 0 0  Altered sleeping 2  1  Tired, decreased energy 3  3  Change in appetite 0  0  Feeling bad or failure about yourself  0  0  Trouble concentrating 0  0  Moving slowly or fidgety/restless 0  0  Suicidal thoughts 0  0  PHQ-9 Score 5  4  Difficult doing work/chores Not difficult at all  Not difficult at all      07/29/2024    1:11 PM 08/20/2023   10:34 AM 02/13/2021   11:23 AM  GAD 7 : Generalized Anxiety Score  Nervous, Anxious, on  Edge 0 0 0  Control/stop worrying 0 1 2  Worry too much - different things 0 1 2  Trouble relaxing 0 0 0  Restless 0 0 0  Easily annoyed or irritable 0 0 0  Afraid - awful might happen 0 0 0  Total GAD 7 Score 0 2 4  Anxiety Difficulty  Not difficult at all Not difficult at all    Medications: Outpatient Medications Prior to Visit  Medication Sig   amLODipine  (NORVASC ) 10 MG tablet Take 1 tablet (10 mg total) by mouth daily.   atorvastatin  (LIPITOR) 10 MG tablet Take 1 tablet (10 mg total) by mouth daily.   Calcium  Carb-Cholecalciferol (OYSTER SHELL CALCIUM  + D PO) Take 1 tablet by mouth daily. 600mg  calcium  plus 20 mg Vit D   Cholecalciferol (VITAMIN D-3) 25 MCG (1000 UT) CAPS Take 1,000 Units by mouth  daily.    escitalopram  (LEXAPRO ) 10 MG tablet Take 1 tablet (10 mg total) by mouth daily.   metoprolol  succinate (TOPROL -XL) 100 MG 24 hr tablet Take 2 tablets (200 mg total) by mouth daily.   omeprazole  (PRILOSEC ) 20 MG capsule Take 1 capsule (20 mg total) by mouth daily.   potassium chloride  SA (KLOR-CON  M) 20 MEQ tablet Take 1 tablet (20 mEq total) by mouth daily.   traZODone  (DESYREL ) 50 MG tablet Take 1 tablet (50 mg total) by mouth daily.   valsartan -hydrochlorothiazide  (DIOVAN -HCT) 320-12.5 MG tablet Take 1 tablet by mouth daily. (Patient taking differently: Take 1 tablet by mouth daily. Takes 1.5mg )   warfarin (COUMADIN ) 1 MG tablet Take 1 tablet by mouth daily. Takes 2mg  on Tue/Thurs & 1.5mg  on all other days   pneumococcal 23 valent vaccine (PNEUMOVAX 23) 25 MCG/0.5ML injection    torsemide (DEMADEX) 20 MG tablet Take 20 mg by mouth daily.   triamcinolone  ointment (KENALOG ) 0.5 % APPLY TOPICALLY TO BILATER AL LOWER LEGS TWICE DAILY   warfarin (COUMADIN ) 2 MG tablet Take 2 mg by mouth as directed. 1.5 mg x 5 days and 2mg  x 2 days. (Takes 2mg  on Tue/Thurs & 1.5mg  on all other days ) (Patient not taking: Reported on 06/09/2024)   No facility-administered medications prior to visit.    Review of Systems All negative Except see HPI       Objective    BP 133/67 (BP Location: Left Arm, Patient Position: Sitting, Cuff Size: Normal)   Pulse 72   Resp 16   Ht 5' 4 (1.626 m)   Wt 235 lb 9.6 oz (106.9 kg)   SpO2 99%   BMI 40.44 kg/m     Physical Exam Vitals reviewed.  Constitutional:      General: She is not in acute distress.    Appearance: Normal appearance. She is well-developed. She is obese. She is not diaphoretic.  HENT:     Head: Normocephalic and atraumatic.  Eyes:     General: No scleral icterus.    Conjunctiva/sclera: Conjunctivae normal.  Neck:     Thyroid: No thyromegaly.  Cardiovascular:     Rate and Rhythm: Normal rate and regular rhythm.     Pulses:  Normal pulses.     Heart sounds: Normal heart sounds. No murmur heard. Pulmonary:     Effort: Pulmonary effort is normal. No respiratory distress.     Breath sounds: Normal breath sounds. No wheezing, rhonchi or rales.  Musculoskeletal:     Cervical back: Neck supple.     Right lower leg: No edema.     Left  lower leg: No edema.  Lymphadenopathy:     Cervical: No cervical adenopathy.  Skin:    General: Skin is warm and dry.     Findings: No rash.  Neurological:     Mental Status: She is alert and oriented to person, place, and time. Mental status is at baseline.  Psychiatric:        Mood and Affect: Mood normal.        Behavior: Behavior normal.      No results found for any visits on 07/29/24.      Assessment & Plan Hypokalemia Recent low potassium levels, possibly due to diuretic use (torsemide). - Recheck potassium levels. - Check kidney function and electrolytes. Will follow-up  Essential hypertension Chronic Managed with amlodipine , metoprolol  succinate, valsartan , and hydrochlorothiazide . Blood pressure control is crucial due to comorbid conditions. Continue current regimen And lifestyle modifications Will follow-up  Atrial fibrillation Chronic, well-managed with regular cardiology follow-up, per pt and per Recent cardiology notes Managed by cardiology  Edema of lower extremities Heart failure Persistent.  - Discuss salt and water intake. - Consider dietary adjustments. Continue to use torsemide and hydrochlorothiazide  for management Will reassess at the follow-up  Managed by cardiology  Hypercholesterolemia Chronic  managed with atorvastatin . Requires regular cholesterol monitoring. - Check cholesterol levels. Follow-up  Depression and anxiety/grief/insomnia Chronic and stable  managed with trazodone  and escitalopram . Recent pet loss may exacerbate symptoms. Declined counseling services. Will follow-up  Gastroesophageal reflux disease with  Schatzki ring Symptoms controlled with omeprazole . Advised to avoid long-term high doses due to potential cardiac issues. Consider referral to GI Will follow-up  Osteoporosis Previous medication caused pain, leading to discontinuation.  Currently on vitamin D3 Will reassess at the follow-up  History of breast cancer, status post mastectomy Released from oncology care. No further mammograms required. Will discuss at the follow-up  Pruritus and excoriations of lower extremities Chronic itching and excoriations. Previous use of triamcinolone  ointment. - Apply cold compresses. - Avoid scratching. - Avoid using triamcinolone  on open areas. Will reassess at the follow-up  Primary hypertension (Primary)  - Basic Metabolic Panel (BMET) - Lipid panel - Hemoglobin A1c - TSH  Hypercholesteremia  - Basic Metabolic Panel (BMET) - Lipid panel - Hemoglobin A1c - TSH  Morbid obesity (HCC) Chronic and stable Body mass index is 40.44 kg/m. Weight loss of 5% of pt's current weight via healthy diet and daily exercise encouraged. Workup ordered Elevated glucose in the past - Basic Metabolic Panel (BMET) - Lipid panel - Hemoglobin A1c - TSH  Will follow-up    No orders of the defined types were placed in this encounter.   No follow-ups on file.   The patient was advised to call back or seek an in-person evaluation if the symptoms worsen or if the condition fails to improve as anticipated.  I discussed the assessment and treatment plan with the patient. The patient was provided an opportunity to ask questions and all were answered. The patient agreed with the plan and demonstrated an understanding of the instructions.  I, Yanky Vanderburg, PA-C have reviewed all documentation for this visit. The documentation on 07/29/2024  for the exam, diagnosis, procedures, and orders are all accurate and complete.  Jolynn Spencer, [facility], MMS [facility] [phone]  (phone) [phone] (fax)  [facility]

## 2024-08-17 DIAGNOSIS — Z7901 Long term (current) use of anticoagulants: Secondary | ICD-10-CM | POA: Diagnosis not present

## 2024-08-17 DIAGNOSIS — I480 Paroxysmal atrial fibrillation: Secondary | ICD-10-CM | POA: Diagnosis not present

## 2024-08-18 DIAGNOSIS — I10 Essential (primary) hypertension: Secondary | ICD-10-CM | POA: Diagnosis not present

## 2024-08-18 DIAGNOSIS — E7800 Pure hypercholesterolemia, unspecified: Secondary | ICD-10-CM | POA: Diagnosis not present

## 2024-08-19 LAB — LIPID PANEL
Chol/HDL Ratio: 2.9 ratio (ref 0.0–4.4)
Cholesterol, Total: 143 mg/dL (ref 100–199)
HDL: 50 mg/dL (ref >39)
LDL Chol Calc (NIH): 65 mg/dL (ref 0–99)
Triglycerides: 164 mg/dL — ABNORMAL HIGH (ref 0–149)
VLDL Cholesterol Cal: 28 mg/dL (ref 5–40)

## 2024-08-19 LAB — BASIC METABOLIC PANEL WITH GFR
BUN/Creatinine Ratio: 21 (ref 12–28)
BUN: 19 mg/dL (ref 8–27)
CO2: 24 mmol/L (ref 20–29)
Calcium: 9.4 mg/dL (ref 8.7–10.3)
Chloride: 97 mmol/L (ref 96–106)
Creatinine, Ser: 0.91 mg/dL (ref 0.57–1.00)
Glucose: 131 mg/dL — ABNORMAL HIGH (ref 70–99)
Potassium: 3.5 mmol/L (ref 3.5–5.2)
Sodium: 139 mmol/L (ref 134–144)
eGFR: 66 mL/min/1.73 (ref >59)

## 2024-08-19 LAB — HEMOGLOBIN A1C
Est. average glucose Bld gHb Est-mCnc: 114 mg/dL
Hgb A1c MFr Bld: 5.6 % (ref 4.8–5.6)

## 2024-08-19 LAB — TSH: TSH: 1.91 u[IU]/mL (ref 0.450–4.500)

## 2024-08-23 ENCOUNTER — Ambulatory Visit: Payer: Self-pay | Admitting: Physician Assistant

## 2024-08-30 DIAGNOSIS — I480 Paroxysmal atrial fibrillation: Secondary | ICD-10-CM | POA: Diagnosis not present

## 2024-08-30 DIAGNOSIS — Z7901 Long term (current) use of anticoagulants: Secondary | ICD-10-CM | POA: Diagnosis not present

## 2024-09-19 ENCOUNTER — Telehealth: Payer: Self-pay | Admitting: Physician Assistant

## 2024-09-19 ENCOUNTER — Other Ambulatory Visit: Payer: Self-pay

## 2024-09-19 DIAGNOSIS — I10 Essential (primary) hypertension: Secondary | ICD-10-CM

## 2024-09-19 NOTE — Telephone Encounter (Signed)
 Converted

## 2024-09-19 NOTE — Telephone Encounter (Signed)
 LOV 07/29/24 NOV 10/28/24 LRF 07/09/23 100 x 3 States patient takes 1.5 pills daily, if so number of pills is for 1 daily

## 2024-09-19 NOTE — Telephone Encounter (Signed)
 [facility] faxed refill request for the following medications:  \valsartan -hydrochlorothiazide  (DIOVAN -HCT) 320-12.5 MG tablet    Please advise.

## 2024-09-20 ENCOUNTER — Ambulatory Visit: Payer: Medicare HMO

## 2024-09-20 ENCOUNTER — Other Ambulatory Visit: Payer: Self-pay | Admitting: Physician Assistant

## 2024-09-20 DIAGNOSIS — E7800 Pure hypercholesterolemia, unspecified: Secondary | ICD-10-CM

## 2024-09-20 DIAGNOSIS — Z0000 Encounter for general adult medical examination without abnormal findings: Secondary | ICD-10-CM

## 2024-09-20 NOTE — Progress Notes (Signed)
 Subjective:   Erin Romero is a 73 y.o. who presents for a Medicare Wellness preventive visit.  As a reminder, Annual Wellness Visits don't include a physical exam, and some assessments may be limited, especially if this visit is performed virtually. We may recommend an in-person follow-up visit with your provider if needed.  Visit Complete: Virtual I connected with  Erin Romero on 09/20/24 by a audio enabled telemedicine application and verified that I am speaking with the correct person using two identifiers.  Patient Location: Home  Provider Location: Office/Clinic  I discussed the limitations of evaluation and management by telemedicine. The patient expressed understanding and agreed to proceed.  Vital Signs: Because this visit was a virtual/telehealth visit, some criteria may be missing or patient reported. Any vitals not documented were not able to be obtained and vitals that have been documented are patient reported.  VideoDeclined- This patient declined Librarian, academic. Therefore the visit was completed with audio only.  Persons Participating in Visit: Patient.  AWV Questionnaire: No: Patient Medicare AWV questionnaire was not completed prior to this visit.  Cardiac Risk Factors include: advanced age (>45men, >39 women);dyslipidemia;hypertension;sedentary lifestyle;obesity (BMI >30kg/m2)     Objective:    There were no vitals filed for this visit. There is no height or weight on file to calculate BMI.     09/20/2024   11:42 AM 06/09/2024   11:00 AM 09/16/2023   11:26 AM 06/15/2023   10:30 AM 10/25/2022    4:35 PM 09/10/2022   10:40 AM 06/11/2022    1:48 PM  Advanced Directives  Does Patient Have a Medical Advance Directive? No Yes Yes Yes No No Yes  Type of Best boy of [place];Living will Healthcare Power of [place];Living will     Would patient like information on creating a medical advance  directive? No - Patient declined    No - Patient declined No - Patient declined     Current Medications (verified) Outpatient Encounter Medications as of 09/20/2024  Medication Sig   amLODipine  (NORVASC ) 10 MG tablet Take 1 tablet (10 mg total) by mouth daily.   atorvastatin  (LIPITOR) 10 MG tablet Take 1 tablet (10 mg total) by mouth daily.   Calcium  Carb-Cholecalciferol (OYSTER SHELL CALCIUM  + D PO) Take 1 tablet by mouth daily. 600mg  calcium  plus 20 mg Vit D   Cholecalciferol (VITAMIN D-3) 25 MCG (1000 UT) CAPS Take 1,000 Units by mouth daily.    escitalopram  (LEXAPRO ) 10 MG tablet Take 1 tablet (10 mg total) by mouth daily.   metoprolol  succinate (TOPROL -XL) 100 MG 24 hr tablet Take 2 tablets (200 mg total) by mouth daily.   omeprazole  (PRILOSEC ) 20 MG capsule Take 1 capsule (20 mg total) by mouth daily.   pneumococcal 23 valent vaccine (PNEUMOVAX 23) 25 MCG/0.5ML injection    potassium chloride  SA (KLOR-CON  M) 20 MEQ tablet Take 1 tablet (20 mEq total) by mouth daily.   torsemide (DEMADEX) 20 MG tablet Take 20 mg by mouth daily.   traZODone  (DESYREL ) 50 MG tablet Take 1 tablet (50 mg total) by mouth daily.   valsartan -hydrochlorothiazide  (DIOVAN -HCT) 320-12.5 MG tablet Take 1 tablet by mouth daily.   warfarin (COUMADIN ) 1 MG tablet Take 1 tablet by mouth daily. Takes 2mg  on Tue/Thurs & 1.5mg  on all other days   warfarin (COUMADIN ) 2 MG tablet Take 2 mg by mouth as directed. 1.5 mg x 5 days and 2mg  x 2 days. (Takes 2mg  on Tue/Thurs &  1.5mg  on all other days )   No facility-administered encounter medications on file as of 09/20/2024.    Allergies (verified) Docetaxel and Sulfa antibiotics   History: Past Medical History:  Diagnosis Date   Anxiety    Atrial fibrillation (HCC) 2014   Breast cancer (HCC) 2014   right breast   Dysrhythmia    Fibrocystic breast disease 2005   GERD (gastroesophageal reflux disease)    Hypertension 2005   Lump or mass in breast 2014   right  mastectomy   Malignant neoplasm of central portion of female breast (HCC) 01/24/2013   Stage II,T2, N1a. ER/PR 90%, HER-2/neu: Not overexpressed. Mastectomy with sentinel node biopsy. 2 sentinel nodes negative, non-sentinel node w/ 5 mm macrometastatic foci in non-sentinel node.  Received post mastectomy radiation.    Past Surgical History:  Procedure Laterality Date   ABDOMINAL HYSTERECTOMY  1999   BREAST BIOPSY Left 2015   BENIGN FIBROTIC BREAST TISSUE WITH CLUSTERED APOCRINE   BREAST BIOPSY Left 11/02/2019   affirm bx, x marker, path pending   BREAST SURGERY Right 2005,2014   biopsy   BREAST SURGERY Right 2014   mastectomy   COLONOSCOPY  2002   COLONOSCOPY WITH PROPOFOL  N/A 10/16/2015   Procedure: COLONOSCOPY WITH PROPOFOL ;  Surgeon: Reyes LELON Cota, MD;  Location: [facility];  Service: Endoscopy;  Laterality: N/A;   COLONOSCOPY WITH PROPOFOL  N/A 10/31/2020   Procedure: COLONOSCOPY WITH PROPOFOL ;  Surgeon: Janalyn Keene NOVAK, MD;  Location: [facility];  Service: Endoscopy;  Laterality: N/A;   ESOPHAGOGASTRODUODENOSCOPY (EGD) WITH PROPOFOL  N/A 10/31/2020   Procedure: ESOPHAGOGASTRODUODENOSCOPY (EGD) WITH PROPOFOL ;  Surgeon: Janalyn Keene NOVAK, MD;  Location: [facility];  Service: Endoscopy;  Laterality: N/A;   GIVENS CAPSULE STUDY N/A 12/27/2020   Procedure: GIVENS CAPSULE STUDY;  Surgeon: Janalyn Keene NOVAK, MD;  Location: [facility];  Service: Endoscopy;  Laterality: N/A;   KNEE SURGERY Left 2005   MASTECTOMY Right 2014   radiation chemo   SIMPLE MASTECTOMY WITH AXILLARY SENTINEL NODE BIOPSY Left 11/30/2019   Procedure: SIMPLE MASTECTOMY WITH AXILLARY SENTINEL NODE BIOPSY;  Surgeon: Cota Reyes LELON, MD;  Location: [facility];  Service: General;  Laterality: Left;   thumb surg Right 2004   TONSILLECTOMY     age of 3   Family History  Problem Relation Age of Onset   Heart disease Father    Hypertension Sister    Cancer Mother        cervical   Cancer  Other        ovarian,liver,colon cancer-no family member listed   Osteoporosis Paternal Aunt    Osteoporosis Paternal Grandmother    Social History   Socioeconomic History   Marital status: Widowed    Spouse name: Noell Lorensen   Number of children: 0   Years of education: 12   Highest education level: 12th grade  Occupational History   Occupation: retired  Tobacco Use   Smoking status: Never   Smokeless tobacco: Never  Vaping Use   Vaping status: Never Used  Substance and Sexual Activity   Alcohol use: No   Drug use: No   Sexual activity: Not Currently  Other Topics Concern   Not on file  Social History Narrative   Not on file   Social Drivers of Health   Financial Resource Strain: Low Risk  (09/20/2024)   Overall Financial Resource Strain (CARDIA)    Difficulty of Paying Living Expenses: Not hard at all  Recent Concern: Physicist, medical Strain - Medium  Risk (07/29/2024)   Overall Financial Resource Strain (CARDIA)    Difficulty of Paying Living Expenses: Somewhat hard  Food Insecurity: No Food Insecurity (09/20/2024)   Hunger Vital Sign    Worried About Running Out of Food in the Last Year: Never true    Ran Out of Food in the Last Year: Never true  Recent Concern: Food Insecurity - Food Insecurity Present (07/29/2024)   Hunger Vital Sign    Worried About Running Out of Food in the Last Year: Sometimes true    Ran Out of Food in the Last Year: Sometimes true  Transportation Needs: No Transportation Needs (09/20/2024)   PRAPARE - Administrator, Civil Service (Medical): No    Lack of Transportation (Non-Medical): No  Physical Activity: Inactive (09/20/2024)   Exercise Vital Sign    Days of Exercise per Week: 0 days    Minutes of Exercise per Session: 0 min  Stress: No Stress Concern Present (09/20/2024)   [employer] of Occupational Health - Occupational Stress Questionnaire    Feeling of Stress: Not at all  Recent Concern: Stress - Stress Concern  Present (07/29/2024)   [employer] of Occupational Health - Occupational Stress Questionnaire    Feeling of Stress: To some extent  Social Connections: Socially Isolated (09/20/2024)   Social Connection and Isolation Panel    Frequency of Communication with Friends and Family: Twice a week    Frequency of Social Gatherings with Friends and Family: Never    Attends Religious Services: Never    Database administrator or Organizations: No    Attends Banker Meetings: Never    Marital Status: Widowed    Tobacco Counseling Counseling given: Not Answered    Clinical Intake:  Pre-visit preparation completed: Yes  Pain : No/denies pain     BMI - recorded: 40.3 Nutritional Status: BMI > 30  Obese Nutritional Risks: None Diabetes: No  Lab Results  Component Value Date   HGBA1C 5.6 08/18/2024   HGBA1C 5.1 07/09/2023   HGBA1C 5.6 02/27/2022     How often do you need to have someone help you when you read instructions, pamphlets, or other written materials from your doctor or pharmacy?: 1 - Never  Interpreter Needed?: No  Information entered by :: JHONNIE DAS, LPN   Activities of Daily Living    09/20/2024   11:44 AM  In your present state of health, do you have any difficulty performing the following activities:  Hearing? 0  Vision? 0  Difficulty concentrating or making decisions? 0  Walking or climbing stairs? 1  Comment AFIB, GETS OUT OF BREATH  Dressing or bathing? 0  Doing errands, shopping? 0  Preparing Food and eating ? N  Using the Toilet? N  In the past six months, have you accidently leaked urine? N  Do you have problems with loss of bowel control? N  Managing your Medications? N  Managing your Finances? N  Housekeeping or managing your Housekeeping? N    Patient Care Team: Ostwalt, Janna, PA-C as PCP - General (Physician Assistant) Dessa Reyes ORN, MD as Consulting Physician (General Surgery) Bosie Vinie LABOR, MD as Consulting  Physician (Cardiology) Babara Call, MD as Consulting Physician (Oncology) Lynell Anes, OD (Optometry) Elma Zachary RAMAN [facility])  I have updated your Care Teams any recent Medical Services you may have received from other providers in the past year.     Assessment:   This is a routine wellness examination for [place].  Hearing/Vision screen Hearing Screening - Comments:: NO AIDS Vision Screening - Comments:: WEARS GLASSES ALL DAY- [facility]   Goals Addressed             This Visit's Progress    Cut out extra servings         Depression Screen     09/20/2024   11:39 AM 07/29/2024    1:11 PM 09/16/2023   11:22 AM 08/20/2023   10:33 AM 07/09/2023   11:09 AM 07/09/2023   10:34 AM 11/05/2022    2:48 PM  PHQ 2/9 Scores  PHQ - 2 Score 0 0 0 0 0 0 0  PHQ- 9 Score 0 5  4 5 5 3     Fall Risk     09/20/2024   11:43 AM 07/29/2024    1:11 PM 09/16/2023   11:18 AM 08/20/2023   10:33 AM 07/09/2023   11:08 AM  Fall Risk   Falls in the past year? 0 0 0 0 0  Number falls in past yr: 0 0 0 0 0  Injury with Fall? 0 0 0 0 0  Risk for fall due to : No Fall Risks No Fall Risks No Fall Risks No Fall Risks No Fall Risks  Follow up Falls evaluation completed;Falls prevention discussed  Education provided;Falls prevention discussed Falls evaluation completed Falls evaluation completed    MEDICARE RISK AT HOME:  Medicare Risk at Home Any stairs in or around the home?: Yes If so, are there any without handrails?: No Home free of loose throw rugs in walkways, pet beds, electrical cords, etc?: Yes Adequate lighting in your home to reduce risk of falls?: Yes Life alert?: No Use of a cane, walker or w/c?: Yes (CANE OR WALKER WHEN NEEDED) Grab bars in the bathroom?: No Shower chair or bench in shower?: Yes Elevated toilet seat or a handicapped toilet?: No  TIMED UP AND GO:  Was the test performed?  No  Cognitive Function: 6CIT completed        09/20/2024   11:46 AM 09/16/2023   11:30  AM 09/10/2022   10:44 AM 09/08/2021   11:12 AM 06/20/2019   11:00 AM  6CIT Screen  What Year? 0 points 0 points 0 points 0 points 0 points  What month? 0 points 0 points 0 points 0 points 0 points  What time? 0 points 0 points 0 points 0 points 0 points  Count back from 20 0 points 0 points 0 points 0 points 0 points  Months in reverse 0 points 0 points 0 points 0 points 0 points  Repeat phrase 0 points 0 points 0 points 0 points 0 points  Total Score 0 points 0 points 0 points 0 points 0 points    Immunizations Immunization History  Administered Date(s) Administered   Fluad Quad(high Dose 65+) 09/19/2019, 11/05/2022   Fluad Trivalent(High Dose 65+) 08/20/2023   INFLUENZA, HIGH DOSE SEASONAL PF 09/14/2018   Influenza,inj,Quad PF,6+ Mos 09/06/2015, 09/21/2017   PNEUMOCOCCAL CONJUGATE-20 08/20/2023   Pneumococcal Conjugate-13 08/22/2015   Pneumococcal Polysaccharide-23 09/03/2016, 09/21/2017   Tdap 08/08/2020    Screening Tests Health Maintenance  Topic Date Due   COVID-19 Vaccine (3 - Pfizer risk series) 06/12/2020   Zoster Vaccines- Shingrix (1 of 2) 10/29/2024 (Originally 03/23/1970)   Medicare Annual Wellness (AWV)  09/20/2025   DEXA SCAN  09/27/2025   DTaP/Tdap/Td (2 - Td or Tdap) 08/08/2030   Colonoscopy  10/31/2030   Pneumococcal Vaccine: 50+ Years  Completed   Hepatitis  C Screening  Completed   HPV VACCINES  Aged Out   Meningococcal B Vaccine  Aged Out   Influenza Vaccine  Discontinued   Mammogram  Discontinued    Health Maintenance Items Addressed: NEEDS SHINGRIX & FLU; WANTS NO COVID; UP TO DATE ON PNA & TDAP; DOESN'T NEED MAMMOGRAM D/T BILATERAL MASTECTOMIES; AGED OUT OF COLONOSCOPY; UP TO DATE   Additional Screening:  Vision Screening: Recommended annual ophthalmology exams for early detection of glaucoma and other disorders of the eye. Is the patient up to date with their annual eye exam?  Yes  Who is the provider or what is the name of the office in which the  patient attends annual eye exams? [facility]  Dental Screening: Recommended annual dental exams for proper oral hygiene  Community Resource Referral / Chronic Care Management: CRR required this visit?  No   CCM required this visit?  No   Plan:    I have personally reviewed and noted the following in the patient's chart:   Medical and social history Use of alcohol, tobacco or illicit drugs  Current medications and supplements including opioid prescriptions. Patient is not currently taking opioid prescriptions. Functional ability and status Nutritional status Physical activity Advanced directives List of other physicians Hospitalizations, surgeries, and ER visits in previous 12 months Vitals Screenings to include cognitive, depression, and falls Referrals and appointments  In addition, I have reviewed and discussed with patient certain preventive protocols, quality metrics, and best practice recommendations. A written personalized care plan for preventive services as well as general preventive health recommendations were provided to patient.   Jhonnie GORMAN Das, LPN   [id number]   After Visit Summary: (MyChart) Due to this being a telephonic visit, the after visit summary with patients personalized plan was offered to patient via MyChart   Notes: Nothing significant to report at this time.

## 2024-09-20 NOTE — Patient Instructions (Addendum)
 [name],  Thank you for taking the time for your Medicare Wellness Visit. I appreciate your continued commitment to your health goals. Please review the care plan we discussed, and feel free to reach out if I can assist you further.  Medicare recommends these wellness visits once per year to help you and your care team stay ahead of potential health issues. These visits are designed to focus on prevention, allowing your provider to concentrate on managing your acute and chronic conditions during your regular appointments.  Please note that Annual Wellness Visits do not include a physical exam. Some assessments may be limited, especially if the visit was conducted virtually. If needed, we may recommend a separate in-person follow-up with your provider.  Ongoing Care Seeing your primary care provider every 3 to 6 months helps us  monitor your health and provide consistent, personalized care.   Referrals If a referral was made during today's visit and you haven't received any updates within two weeks, please contact the referred provider directly to check on the status.  Recommended Screenings:  Health Maintenance  Topic Date Due   COVID-19 Vaccine (3 - Pfizer risk series) 06/12/2020   Zoster (Shingles) Vaccine (1 of 2) 10/29/2024*   Medicare Annual Wellness Visit  09/20/2025   DEXA scan (bone density measurement)  09/27/2025   DTaP/Tdap/Td vaccine (2 - Td or Tdap) 08/08/2030   Colon Cancer Screening  10/31/2030   Pneumococcal Vaccine for age over 84  Completed   Hepatitis C Screening  Completed   HPV Vaccine  Aged Out   Meningitis B Vaccine  Aged Out   Flu Shot  Discontinued   Breast Cancer Screening  Discontinued  *Topic was postponed. The date shown is not the original due date.     Advance Care Planning is important because it: Ensures you receive medical care that aligns with your values, goals, and preferences. Provides guidance to your family and loved ones, reducing the  emotional burden of decision-making during critical moments.  Vision: Annual vision screenings are recommended for early detection of glaucoma, cataracts, and diabetic retinopathy. These exams can also reveal signs of chronic conditions such as diabetes and high blood pressure.  Dental: Annual dental screenings help detect early signs of oral cancer, gum disease, and other conditions linked to overall health, including heart disease and diabetes.  Please see the attached documents for additional preventive care recommendations.   NEXT AWV  09/26/25 @ 11:30 AM BY PHONE

## 2024-09-21 ENCOUNTER — Telehealth: Payer: Self-pay | Admitting: Physician Assistant

## 2024-09-21 ENCOUNTER — Other Ambulatory Visit: Payer: Self-pay

## 2024-09-21 DIAGNOSIS — I10 Essential (primary) hypertension: Secondary | ICD-10-CM

## 2024-09-21 MED ORDER — VALSARTAN-HYDROCHLOROTHIAZIDE 320-12.5 MG PO TABS: 1.0000 | ORAL_TABLET | Freq: Every day | ORAL | 2 refills | Status: DC

## 2024-09-21 NOTE — Telephone Encounter (Signed)
 [facility] faxed refill request for the following medications:  valsartan -hydrochlorothiazide  (DIOVAN -HCT) 320-12.5 MG tablet    Please advise.

## 2024-09-21 NOTE — Telephone Encounter (Signed)
Medication sent over to pharmacy

## 2024-09-23 MED ORDER — VALSARTAN-HYDROCHLOROTHIAZIDE 320-12.5 MG PO TABS: 1.0000 | ORAL_TABLET | Freq: Every day | ORAL | 3 refills | Status: AC

## 2024-09-29 DIAGNOSIS — I480 Paroxysmal atrial fibrillation: Secondary | ICD-10-CM | POA: Diagnosis not present

## 2024-09-29 DIAGNOSIS — Z7901 Long term (current) use of anticoagulants: Secondary | ICD-10-CM | POA: Diagnosis not present

## 2024-10-03 ENCOUNTER — Other Ambulatory Visit: Payer: Self-pay | Admitting: Physician Assistant

## 2024-10-03 DIAGNOSIS — E7800 Pure hypercholesterolemia, unspecified: Secondary | ICD-10-CM

## 2024-10-03 DIAGNOSIS — G4700 Insomnia, unspecified: Secondary | ICD-10-CM

## 2024-10-03 DIAGNOSIS — I4811 Longstanding persistent atrial fibrillation: Secondary | ICD-10-CM

## 2024-10-03 DIAGNOSIS — F419 Anxiety disorder, unspecified: Secondary | ICD-10-CM

## 2024-10-03 DIAGNOSIS — I5031 Acute diastolic (congestive) heart failure: Secondary | ICD-10-CM

## 2024-10-03 DIAGNOSIS — K2100 Gastro-esophageal reflux disease with esophagitis, without bleeding: Secondary | ICD-10-CM

## 2024-10-03 DIAGNOSIS — I10 Essential (primary) hypertension: Secondary | ICD-10-CM

## 2024-10-03 NOTE — Telephone Encounter (Signed)
 Copied from CRM [id number]. Topic: Clinical - Medication Refill >> Oct 03, 2024  4:15 PM Fonda T wrote: Medication:  amLODipine  (NORVASC ) 10 MG tablet  atorvastatin  (LIPITOR) 10 MG tablet  escitalopram  (LEXAPRO ) 10 MG tablet  metoprolol  succinate (TOPROL -XL) 100 MG 24 hr  Tablet  omeprazole  (PRILOSEC ) 20 MG capsule  potassium chloride  SA (KLOR-CON  M) 20 MEQ tablet  traZODone  (DESYREL ) 50 MG tablet  valsartan -hydrochlorothiazide  (DIOVAN -HCT) 320-12.5 MG tablet  Per patient states she is running out of all medications, and needs them all faxed as soon as possible.   Has the patient contacted their pharmacy? Yes, advised to contact office for refills, as no refill request has been received.    This is the patient's preferred pharmacy:  [facility] - [place], [place] - [address] [place] [address] Phone: [phone] Fax: [phone]  Is this the correct pharmacy for this prescription? Yes If no, delete pharmacy and type the correct one.   Has the prescription been filled recently? Yes  Is the patient out of the medication? Yes ( on some medications)  Has the patient been seen for an appointment in the last year OR does the patient have an upcoming appointment? Yes  Can we respond through MyChart? No, prefers to be contacted at [phone]  Agent: Please be advised that Rx refills may take up to 3 business days. We ask that you follow-up with your pharmacy.

## 2024-10-05 NOTE — Telephone Encounter (Signed)
 Requested medication (s) are due for refill today: requesting fax to pharmacy for all medications  Requested medication (s) are on the active medication list: yes  Last refill:  norvasc ,prilosec ,potassium,lexapro  07/09/23 #100 3 refills, lipitor- 09/20/24 #90 2 refills, metoprolol  - 07/09/23 #200 3 refills, diovan - 09/23/24 #100 3 refills.  Future visit scheduled: no  Notes to clinic:  norvasc , metoprolol , prilosec , potassium, trazodone , lexapro  last ordered by CHARLENA Cedar, FNP 07/09/23. Do you want to order Rxs? Patient requesting fax of orders sent to pharmacy  and lipitor, diovan .      Requested Prescriptions  Pending Prescriptions Disp Refills   amLODipine  (NORVASC ) 10 MG tablet 100 tablet 3    Sig: Take 1 tablet (10 mg total) by mouth daily.     Cardiovascular: Calcium  Channel Blockers 2 Passed - 10/05/2024  4:25 PM      Passed - Last BP in normal range    BP Readings from Last 1 Encounters:  07/29/24 133/67         Passed - Last Heart Rate in normal range    Pulse Readings from Last 1 Encounters:  07/29/24 72         Passed - Valid encounter within last 6 months    Recent Outpatient Visits           2 months ago Primary hypertension   [place] [facility] [place], Janna, PA-C               atorvastatin  (LIPITOR) 10 MG tablet 90 tablet 2    Sig: Take 1 tablet (10 mg total) by mouth daily.     Cardiovascular:  Antilipid - Statins Failed - 10/05/2024  4:25 PM      Failed - Lipid Panel in normal range within the last 12 months    Cholesterol, Total  Date Value Ref Range Status  08/18/2024 143 100 - 199 mg/dL Final   LDL Cholesterol (Calc)  Date Value Ref Range Status  11/27/2017 102 (H) mg/dL (calc) Final    Comment:    Reference range: <100 . Desirable range <100 mg/dL for primary prevention;   <70 mg/dL for patients with CHD or diabetic patients  with > or = 2 CHD risk factors. SABRA LDL-C is now calculated using the Martin-Hopkins   calculation, which is a validated novel method providing  better accuracy than the Friedewald equation in the  estimation of LDL-C.  Gladis APPLETHWAITE et al. SANDREA. 7986;689(80): 2061-2068  (http://education.[facility].com/faq/FAQ164)    LDL Chol Calc (NIH)  Date Value Ref Range Status  08/18/2024 65 0 - 99 mg/dL Final   HDL  Date Value Ref Range Status  08/18/2024 50 >39 mg/dL Final   Triglycerides  Date Value Ref Range Status  08/18/2024 164 (H) 0 - 149 mg/dL Final         Passed - Patient is not pregnant      Passed - Valid encounter within last 12 months    Recent Outpatient Visits           2 months ago Primary hypertension   [place] [facility] [place], Janna, PA-C               metoprolol  succinate (TOPROL -XL) 100 MG 24 hr tablet 200 tablet 3    Sig: Take 2 tablets (200 mg total) by mouth daily.     Cardiovascular:  Beta Blockers Passed - 10/05/2024  4:25 PM      Passed - Last BP in normal range  BP Readings from Last 1 Encounters:  07/29/24 133/67         Passed - Last Heart Rate in normal range    Pulse Readings from Last 1 Encounters:  07/29/24 72         Passed - Valid encounter within last 6 months    Recent Outpatient Visits           2 months ago Primary hypertension   [place] [facility] [place], Janna, PA-C               omeprazole  (PRILOSEC ) 20 MG capsule 100 capsule 3    Sig: Take 1 capsule (20 mg total) by mouth daily.     Gastroenterology: Proton Pump Inhibitors Passed - 10/05/2024  4:25 PM      Passed - Valid encounter within last 12 months    Recent Outpatient Visits           2 months ago Primary hypertension   [place] [facility] [place], Janna, PA-C               potassium chloride  SA (KLOR-CON  M) 20 MEQ tablet 100 tablet 3    Sig: Take 1 tablet (20 mEq total) by mouth daily.     Endocrinology:  Minerals - Potassium Supplementation Passed - 10/05/2024   4:25 PM      Passed - K in normal range and within 360 days    Potassium  Date Value Ref Range Status  08/18/2024 3.5 3.5 - 5.2 mmol/L Final  12/20/2014 3.5 3.5 - 5.1 mmol/L Final         Passed - Cr in normal range and within 360 days    Creatinine  Date Value Ref Range Status  06/06/2024 0.91 0.44 - 1.00 mg/dL Final  87/69/7984 9.13 0.60 - 1.30 mg/dL Final   Creatinine, Ser  Date Value Ref Range Status  08/18/2024 0.91 0.57 - 1.00 mg/dL Final         Passed - Valid encounter within last 12 months    Recent Outpatient Visits           2 months ago Primary hypertension   [place] [facility] [place], Janna, PA-C               traZODone  (DESYREL ) 50 MG tablet 100 tablet 3    Sig: Take 1 tablet (50 mg total) by mouth daily.     Psychiatry: Antidepressants - Serotonin Modulator Passed - 10/05/2024  4:25 PM      Passed - Valid encounter within last 6 months    Recent Outpatient Visits           2 months ago Primary hypertension   [facility] [place], Janna, PA-C               valsartan -hydrochlorothiazide  (DIOVAN -HCT) 320-12.5 MG tablet 100 tablet 3    Sig: Take 1 tablet by mouth daily.     Cardiovascular: ARB + Diuretic Combos Passed - 10/05/2024  4:25 PM      Passed - K in normal range and within 180 days    Potassium  Date Value Ref Range Status  08/18/2024 3.5 3.5 - 5.2 mmol/L Final  12/20/2014 3.5 3.5 - 5.1 mmol/L Final         Passed - Na in normal range and within 180 days    Sodium  Date Value Ref Range Status  08/18/2024 139 134 - 144 mmol/L Final  12/20/2014 142 136 - 145 mmol/L Final         Passed - Cr in normal range and within 180 days    Creatinine  Date Value Ref Range Status  06/06/2024 0.91 0.44 - 1.00 mg/dL Final  87/69/7984 9.13 0.60 - 1.30 mg/dL Final   Creatinine, Ser  Date Value Ref Range Status  08/18/2024 0.91 0.57 - 1.00 mg/dL Final         Passed - eGFR is 10 or above  and within 180 days    EGFR (African American)  Date Value Ref Range Status  12/20/2014 >60 >23mL/min Final  06/15/2014 >60  Final   GFR calc Af Amer  Date Value Ref Range Status  08/10/2020 75 >59 mL/min/1.73 Final    Comment:    **[facility] currently reports eGFR in compliance with the current**   recommendations of the [employer]. [facility] will   update reporting as new guidelines are published from the NKF-ASN   Task force.    EGFR (Non-African Amer.)  Date Value Ref Range Status  12/20/2014 >60 >44mL/min Final    Comment:    eGFR values <54mL/min/1.73 m2 may be an indication of chronic kidney disease (CKD). Calculated eGFR, using the MRDR Study equation, is useful in  patients with stable renal function. The eGFR calculation will not be reliable in acutely ill patients when serum creatinine is changing rapidly. It is not useful in patients on dialysis. The eGFR calculation may not be applicable to patients at the low and high extremes of body sizes, pregnant women, and vegetarians.   06/15/2014 >60  Final    Comment:    eGFR values <62mL/min/1.73 m2 may be an indication of chronic kidney disease (CKD). Calculated eGFR is useful in patients with stable renal function. The eGFR calculation will not be reliable in acutely ill patients when serum creatinine is changing rapidly. It is not useful in  patients on dialysis. The eGFR calculation may not be applicable to patients at the low and high extremes of body sizes, pregnant women, and vegetarians.    GFR, Estimated  Date Value Ref Range Status  06/06/2024 >60 >60 mL/min Final    Comment:    (NOTE) Calculated using the CKD-EPI Creatinine Equation (2021)    eGFR  Date Value Ref Range Status  08/18/2024 66 >59 mL/min/1.73 Final         Passed - Patient is not pregnant      Passed - Last BP in normal range    BP Readings from Last 1 Encounters:  07/29/24 133/67         Passed - Valid encounter  within last 6 months    Recent Outpatient Visits           2 months ago Primary hypertension   [place] [facility] [place], Janna, PA-C               escitalopram  (LEXAPRO ) 10 MG tablet 100 tablet 3    Sig: Take 1 tablet (10 mg total) by mouth daily.     Psychiatry:  Antidepressants - SSRI Passed - 10/05/2024  4:25 PM      Passed - Valid encounter within last 6 months    Recent Outpatient Visits           2 months ago Primary hypertension   [place] [facility] [place], Janna, PA-C

## 2024-10-06 MED ORDER — OMEPRAZOLE 20 MG PO CPDR: 20.0000 mg | DELAYED_RELEASE_CAPSULE | Freq: Every day | ORAL | 3 refills | Status: AC

## 2024-10-06 MED ORDER — ATORVASTATIN CALCIUM 10 MG PO TABS: 10.0000 mg | ORAL_TABLET | Freq: Every day | ORAL | 2 refills | Status: AC

## 2024-10-06 MED ORDER — METOPROLOL SUCCINATE ER 100 MG PO TB24
200.0000 mg | ORAL_TABLET | Freq: Every day | ORAL | 3 refills | Status: AC
Start: 2024-10-06 — End: ?

## 2024-10-06 MED ORDER — AMLODIPINE BESYLATE 10 MG PO TABS: 10.0000 mg | ORAL_TABLET | Freq: Every day | ORAL | 3 refills | Status: AC

## 2024-10-06 MED ORDER — POTASSIUM CHLORIDE CRYS ER 20 MEQ PO TBCR: 20.0000 meq | EXTENDED_RELEASE_TABLET | Freq: Every day | ORAL | 3 refills | Status: AC

## 2024-10-07 MED ORDER — TRAZODONE HCL 50 MG PO TABS: 50.0000 mg | ORAL_TABLET | Freq: Every day | ORAL | 3 refills | Status: AC

## 2024-10-07 MED ORDER — ESCITALOPRAM OXALATE 10 MG PO TABS: 10.0000 mg | ORAL_TABLET | Freq: Every day | ORAL | 3 refills | Status: AC

## 2024-10-28 ENCOUNTER — Encounter: Payer: Self-pay | Admitting: Physician Assistant

## 2024-10-28 ENCOUNTER — Ambulatory Visit (INDEPENDENT_AMBULATORY_CARE_PROVIDER_SITE_OTHER): Admitting: Physician Assistant

## 2024-10-28 VITALS — BP 117/79 | HR 65 | Resp 16 | Ht 64.0 in | Wt 236.2 lb

## 2024-10-28 DIAGNOSIS — F419 Anxiety disorder, unspecified: Secondary | ICD-10-CM

## 2024-10-28 DIAGNOSIS — N1831 Chronic kidney disease, stage 3a: Secondary | ICD-10-CM | POA: Diagnosis not present

## 2024-10-28 DIAGNOSIS — G4733 Obstructive sleep apnea (adult) (pediatric): Secondary | ICD-10-CM

## 2024-10-28 DIAGNOSIS — I10 Essential (primary) hypertension: Secondary | ICD-10-CM

## 2024-10-28 DIAGNOSIS — E7800 Pure hypercholesterolemia, unspecified: Secondary | ICD-10-CM | POA: Diagnosis not present

## 2024-10-28 DIAGNOSIS — G629 Polyneuropathy, unspecified: Secondary | ICD-10-CM

## 2024-10-28 DIAGNOSIS — F424 Excoriation (skin-picking) disorder: Secondary | ICD-10-CM

## 2024-10-28 NOTE — Progress Notes (Signed)
 Established patient visit  Patient: Erin Romero   DOB: 29-Mar-1951   73 y.o. Female  MRN: [id number] Visit Date: 10/28/2024  Today's healthcare provider: Jolynn Spencer, PA-C   Chief Complaint  Patient presents with   Medical Management of Chronic Issues    Bilateral foot pain ongoing last few weeks pt thinks possibly neuropathy   Subjective      Discussed the use of AI scribe software for clinical note transcription with the patient, who gave verbal consent to proceed.  History of Present Illness Erin Romero is a 73 year old female with hypertension and neuropathy who presents with tingling and numbness in both legs.  She experiences a persistent burning sensation and nerve pain in both legs. Her medications include amlodipine , valsartan , metoprolol , hydrochlorothiazide , Lipitor, Lexapro , omeprazole , and trazodone . She takes trazodone  at night for sleep and relaxation and uses potassium supplements due to frequent urination from diuretics.  She experiences consistent shortness of breath without chest pain or palpitations. She does not monitor her blood pressure at home due to difficulty using the machine.  She recalls having pneumonia in 2023 and is concerned about preventing future occurrences. She has received a flu shot but is unsure about her pneumonia vaccination status.       09/20/2024   11:39 AM 07/29/2024    1:11 PM 09/16/2023   11:22 AM  Depression screen PHQ 2/9  Decreased Interest 0 0 0  Down, Depressed, Hopeless 0 0 0  PHQ - 2 Score 0 0 0  Altered sleeping 0 2   Tired, decreased energy 0 3   Change in appetite 0 0   Feeling bad or failure about yourself  0 0   Trouble concentrating 0 0   Moving slowly or fidgety/restless 0 0   Suicidal thoughts 0 0   PHQ-9 Score 0  5    Difficult doing work/chores Not difficult at all Not difficult at all      Data saved with a previous flowsheet row definition      07/29/2024    1:11 PM 08/20/2023   10:34 AM  02/13/2021   11:23 AM  GAD 7 : Generalized Anxiety Score  Nervous, Anxious, on Edge 0 0 0  Control/stop worrying 0 1 2  Worry too much - different things 0 1 2  Trouble relaxing 0 0 0  Restless 0 0 0  Easily annoyed or irritable 0 0 0  Afraid - awful might happen 0 0 0  Total GAD 7 Score 0 2 4  Anxiety Difficulty  Not difficult at all Not difficult at all    Medications: Outpatient Medications Prior to Visit  Medication Sig   amLODipine  (NORVASC ) 10 MG tablet Take 1 tablet (10 mg total) by mouth daily.   atorvastatin  (LIPITOR) 10 MG tablet Take 1 tablet (10 mg total) by mouth daily.   Calcium  Carb-Cholecalciferol (OYSTER SHELL CALCIUM  + D PO) Take 1 tablet by mouth daily. 600mg  calcium  plus 20 mg Vit D   Cholecalciferol (VITAMIN D-3) 25 MCG (1000 UT) CAPS Take 1,000 Units by mouth daily.    escitalopram  (LEXAPRO ) 10 MG tablet Take 1 tablet (10 mg total) by mouth daily.   metoprolol  succinate (TOPROL -XL) 100 MG 24 hr tablet Take 2 tablets (200 mg total) by mouth daily.   omeprazole  (PRILOSEC ) 20 MG capsule Take 1 capsule (20 mg total) by mouth daily.   pneumococcal 23 valent vaccine (PNEUMOVAX 23) 25 MCG/0.5ML injection    potassium chloride  SA (KLOR-CON  M)  20 MEQ tablet Take 1 tablet (20 mEq total) by mouth daily.   torsemide (DEMADEX) 20 MG tablet Take 20 mg by mouth daily.   traZODone  (DESYREL ) 50 MG tablet Take 1 tablet (50 mg total) by mouth daily.   valsartan -hydrochlorothiazide  (DIOVAN -HCT) 320-12.5 MG tablet Take 1 tablet by mouth daily.   warfarin (COUMADIN ) 1 MG tablet Take 1 tablet by mouth daily. Takes 2mg  on Tue/Thurs & 1.5mg  on all other days   warfarin (COUMADIN ) 2 MG tablet Take 2 mg by mouth as directed. 1.5 mg x 5 days and 2mg  x 2 days. (Takes 2mg  on Tue/Thurs & 1.5mg  on all other days )   No facility-administered medications prior to visit.    Review of Systems All negative Except see HPI       Objective    BP 117/79   Pulse 65   Resp 16   Ht 5' 4  (1.626 m)   Wt 236 lb 3.2 oz (107.1 kg)   SpO2 100%   BMI 40.54 kg/m     Physical Exam Vitals reviewed.  Constitutional:      General: She is not in acute distress.    Appearance: Normal appearance. She is well-developed. She is not diaphoretic.  HENT:     Head: Normocephalic and atraumatic.  Eyes:     General: No scleral icterus.    Conjunctiva/sclera: Conjunctivae normal.  Neck:     Thyroid: No thyromegaly.  Cardiovascular:     Rate and Rhythm: Normal rate and regular rhythm.     Pulses: Normal pulses.     Heart sounds: Normal heart sounds. No murmur heard. Pulmonary:     Effort: Pulmonary effort is normal. No respiratory distress.     Breath sounds: Normal breath sounds. No wheezing, rhonchi or rales.  Musculoskeletal:     Cervical back: Neck supple.     Right lower leg: No edema.     Left lower leg: No edema.  Lymphadenopathy:     Cervical: No cervical adenopathy.  Skin:    General: Skin is warm and dry.     Findings: No rash.  Neurological:     Mental Status: She is alert and oriented to person, place, and time. Mental status is at baseline.  Psychiatric:        Mood and Affect: Mood normal.        Behavior: Behavior normal.      No results found for any visits on 10/28/24.      Assessment & Plan Peripheral neuropathy Bilateral leg tingling, numbness, and burning sensation suggestive of peripheral neuropathy. Referral to neurology was placed Will follow-up  primary hypertension Blood pressure is well-controlled at 117/70 mmHg with current medications. - Continue current antihypertensive regimen including amlodipine  10, valsartan  320, metoprolol  100, and hydrochlorothiazide  12.5. Continue lifestyle modifications Will follow-up  Pure hypercholesterolemia Chronic and stable with ldl 65 Currently managed with Lipitor 10. - Continue Lipitor for cholesterol management. Continue low cholesterol and daily exercise Will follow-up  Excoriation  (skin-picking) disorder/Chronic pruritus with skin lesions/anxiety Chronic pruritus with reddish bumps, some healing and some new. - Advised to be cautious with scratching to prevent further skin damage. Continue lexapro  10 Will follow-up  Obstructive sleep apnea, on CPAP Chronic Obstructive sleep apnea managed with CPAP. Reports discomfort and feeling of suffocation with CPAP use. - Discussed potential referral to sleep specialist for CPAP adjustment. Will monitor  Morbid obesity (HCC) Chronic Body mass index is 40.54 kg/m. Weight loss of 5% of pt's current  weight via healthy diet and daily exercise encouraged.  Reviewed labwork from 10/28/24 gfr 52 A1c was 5.6 in 07/2024 Will follow-up  General Health Maintenance Discussed vaccinations. Already received flu shot. Pneumonia vaccination status unclear, but likely up to date. RSV vaccination not indicated for adults. - Will verify pneumonia vaccination status. - Will consider pneumonia vaccination if not up to date.  Hypercholesteremia (Primary)  - Lipid panel - Basic metabolic panel with GFR  Primary hypertension  - Lipid panel - Basic metabolic panel with GFR  CKD Stage 3a Advised to drink 8-12 glasses of water every day, avoid NSAIDs, and have renal functions check avery 6-12 months to ensure stability.   Will follow-up    No orders of the defined types were placed in this encounter.   No follow-ups on file.   The patient was advised to call back or seek an in-person evaluation if the symptoms worsen or if the condition fails to improve as anticipated.  I discussed the assessment and treatment plan with the patient. The patient was provided an opportunity to ask questions and all were answered. The patient agreed with the plan and demonstrated an understanding of the instructions.  I, Chavon Lucarelli, PA-C have reviewed all documentation for this visit. The documentation on 10/28/2024  for the exam, diagnosis,  procedures, and orders are all accurate and complete.  Jolynn Spencer, [facility], MMS [facility] [phone] (phone) [phone] (fax)  [facility]

## 2024-10-29 LAB — BASIC METABOLIC PANEL WITH GFR
BUN/Creatinine Ratio: 16 (ref 12–28)
BUN: 18 mg/dL (ref 8–27)
CO2: 25 mmol/L (ref 20–29)
Calcium: 9 mg/dL (ref 8.7–10.3)
Chloride: 95 mmol/L — ABNORMAL LOW (ref 96–106)
Creatinine, Ser: 1.11 mg/dL — ABNORMAL HIGH (ref 0.57–1.00)
Glucose: 107 mg/dL — ABNORMAL HIGH (ref 70–99)
Potassium: 3.5 mmol/L (ref 3.5–5.2)
Sodium: 139 mmol/L (ref 134–144)
eGFR: 52 mL/min/1.73 — ABNORMAL LOW (ref >59)

## 2024-10-29 LAB — LIPID PANEL
Chol/HDL Ratio: 2.7 ratio (ref 0.0–4.4)
Cholesterol, Total: 137 mg/dL (ref 100–199)
HDL: 51 mg/dL (ref >39)
LDL Chol Calc (NIH): 64 mg/dL (ref 0–99)
Triglycerides: 127 mg/dL (ref 0–149)
VLDL Cholesterol Cal: 22 mg/dL (ref 5–40)

## 2024-10-30 ENCOUNTER — Ambulatory Visit: Payer: Self-pay | Admitting: Physician Assistant

## 2024-10-30 DIAGNOSIS — N1831 Chronic kidney disease, stage 3a: Secondary | ICD-10-CM | POA: Insufficient documentation

## 2024-10-30 DIAGNOSIS — G4733 Obstructive sleep apnea (adult) (pediatric): Secondary | ICD-10-CM | POA: Insufficient documentation

## 2024-10-30 DIAGNOSIS — G629 Polyneuropathy, unspecified: Secondary | ICD-10-CM | POA: Insufficient documentation

## 2024-11-01 DIAGNOSIS — Z7901 Long term (current) use of anticoagulants: Secondary | ICD-10-CM | POA: Diagnosis not present

## 2024-11-01 DIAGNOSIS — G4733 Obstructive sleep apnea (adult) (pediatric): Secondary | ICD-10-CM | POA: Diagnosis not present

## 2024-11-01 DIAGNOSIS — I480 Paroxysmal atrial fibrillation: Secondary | ICD-10-CM | POA: Diagnosis not present

## 2024-11-15 DIAGNOSIS — I480 Paroxysmal atrial fibrillation: Secondary | ICD-10-CM | POA: Diagnosis not present

## 2025-02-27 ENCOUNTER — Ambulatory Visit: Admitting: Physician Assistant

## 2025-09-26 ENCOUNTER — Ambulatory Visit
